# Patient Record
Sex: Female | Born: 1960 | Race: White | Hispanic: No | Marital: Single | Smoking: Never smoker
Health system: Southern US, Community
[De-identification: ages and names within clinical notes are randomized; demographics above are authoritative.]

## PROBLEM LIST (undated history)

## (undated) DIAGNOSIS — D649 Anemia, unspecified: Secondary | ICD-10-CM

## (undated) DIAGNOSIS — E11319 Type 2 diabetes mellitus with unspecified diabetic retinopathy without macular edema: Secondary | ICD-10-CM

## (undated) DIAGNOSIS — E109 Type 1 diabetes mellitus without complications: Secondary | ICD-10-CM

## (undated) DIAGNOSIS — H409 Unspecified glaucoma: Secondary | ICD-10-CM

## (undated) DIAGNOSIS — G629 Polyneuropathy, unspecified: Secondary | ICD-10-CM

## (undated) DIAGNOSIS — R591 Generalized enlarged lymph nodes: Secondary | ICD-10-CM

## (undated) DIAGNOSIS — E7800 Pure hypercholesterolemia, unspecified: Secondary | ICD-10-CM

## (undated) DIAGNOSIS — K861 Other chronic pancreatitis: Secondary | ICD-10-CM

## (undated) DIAGNOSIS — N186 End stage renal disease: Secondary | ICD-10-CM

## (undated) DIAGNOSIS — I639 Cerebral infarction, unspecified: Secondary | ICD-10-CM

## (undated) DIAGNOSIS — G5600 Carpal tunnel syndrome, unspecified upper limb: Secondary | ICD-10-CM

## (undated) DIAGNOSIS — G43909 Migraine, unspecified, not intractable, without status migrainosus: Secondary | ICD-10-CM

## (undated) DIAGNOSIS — M869 Osteomyelitis, unspecified: Secondary | ICD-10-CM

## (undated) DIAGNOSIS — F4310 Post-traumatic stress disorder, unspecified: Secondary | ICD-10-CM

## (undated) DIAGNOSIS — F419 Anxiety disorder, unspecified: Secondary | ICD-10-CM

## (undated) DIAGNOSIS — H3530 Unspecified macular degeneration: Secondary | ICD-10-CM

## (undated) DIAGNOSIS — I10 Essential (primary) hypertension: Secondary | ICD-10-CM

## (undated) DIAGNOSIS — E1169 Type 2 diabetes mellitus with other specified complication: Secondary | ICD-10-CM

## (undated) DIAGNOSIS — E039 Hypothyroidism, unspecified: Secondary | ICD-10-CM

## (undated) DIAGNOSIS — K760 Fatty (change of) liver, not elsewhere classified: Secondary | ICD-10-CM

## (undated) DIAGNOSIS — N189 Chronic kidney disease, unspecified: Secondary | ICD-10-CM

## (undated) DIAGNOSIS — F32A Depression, unspecified: Secondary | ICD-10-CM

## (undated) DIAGNOSIS — F329 Major depressive disorder, single episode, unspecified: Secondary | ICD-10-CM

## (undated) DIAGNOSIS — J189 Pneumonia, unspecified organism: Secondary | ICD-10-CM

## (undated) DIAGNOSIS — G459 Transient cerebral ischemic attack, unspecified: Secondary | ICD-10-CM

## (undated) HISTORY — PX: EYE SURGERY: SHX253

## (undated) HISTORY — DX: Depression, unspecified: F32.A

## (undated) HISTORY — DX: Major depressive disorder, single episode, unspecified: F32.9

## (undated) HISTORY — DX: Carpal tunnel syndrome, unspecified upper limb: G56.00

## (undated) HISTORY — DX: Unspecified macular degeneration: H35.30

## (undated) HISTORY — PX: I & D EXTREMITY: SHX5045

## (undated) HISTORY — PX: CATARACT EXTRACTION, BILATERAL: SHX1313

## (undated) HISTORY — DX: Type 1 diabetes mellitus without complications: E10.9

## (undated) HISTORY — DX: Essential (primary) hypertension: I10

## (undated) HISTORY — DX: Unspecified glaucoma: H40.9

## (undated) HISTORY — DX: Post-traumatic stress disorder, unspecified: F43.10

---
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
## 1991-07-14 DIAGNOSIS — J189 Pneumonia, unspecified organism: Secondary | ICD-10-CM

## 1991-07-14 HISTORY — DX: Pneumonia, unspecified organism: J18.9

## 1993-07-13 HISTORY — PX: TUBAL LIGATION: SHX77

## 2008-08-16 DIAGNOSIS — J309 Allergic rhinitis, unspecified: Secondary | ICD-10-CM | POA: Insufficient documentation

## 2008-09-17 ENCOUNTER — Emergency Department (HOSPITAL_COMMUNITY): Admission: EM | Admit: 2008-09-17 | Discharge: 2008-09-17 | Payer: Self-pay | Admitting: Emergency Medicine

## 2008-11-15 ENCOUNTER — Emergency Department (HOSPITAL_COMMUNITY): Admission: EM | Admit: 2008-11-15 | Discharge: 2008-11-15 | Payer: Self-pay | Admitting: Emergency Medicine

## 2009-08-07 ENCOUNTER — Ambulatory Visit: Payer: Self-pay | Admitting: Vascular Surgery

## 2010-03-31 ENCOUNTER — Ambulatory Visit (HOSPITAL_COMMUNITY): Payer: Self-pay | Admitting: Psychiatry

## 2010-04-01 ENCOUNTER — Ambulatory Visit (HOSPITAL_COMMUNITY): Payer: Self-pay | Admitting: Marriage and Family Therapist

## 2010-04-09 ENCOUNTER — Ambulatory Visit (HOSPITAL_COMMUNITY): Payer: Self-pay | Admitting: Marriage and Family Therapist

## 2010-04-17 ENCOUNTER — Ambulatory Visit (HOSPITAL_COMMUNITY): Payer: Self-pay | Admitting: Marriage and Family Therapist

## 2010-04-18 ENCOUNTER — Ambulatory Visit (HOSPITAL_COMMUNITY): Payer: Self-pay | Admitting: Psychiatry

## 2010-04-24 ENCOUNTER — Ambulatory Visit (HOSPITAL_COMMUNITY): Payer: Self-pay | Admitting: Marriage and Family Therapist

## 2010-04-25 DIAGNOSIS — E559 Vitamin D deficiency, unspecified: Secondary | ICD-10-CM | POA: Insufficient documentation

## 2010-04-25 DIAGNOSIS — E785 Hyperlipidemia, unspecified: Secondary | ICD-10-CM | POA: Insufficient documentation

## 2010-05-08 ENCOUNTER — Ambulatory Visit (HOSPITAL_COMMUNITY): Payer: Self-pay | Admitting: Marriage and Family Therapist

## 2010-05-15 ENCOUNTER — Ambulatory Visit (HOSPITAL_COMMUNITY): Payer: Self-pay | Admitting: Marriage and Family Therapist

## 2010-06-18 ENCOUNTER — Ambulatory Visit (HOSPITAL_COMMUNITY): Payer: Self-pay | Admitting: Psychiatry

## 2010-06-26 ENCOUNTER — Ambulatory Visit (HOSPITAL_COMMUNITY): Payer: Self-pay | Admitting: Marriage and Family Therapist

## 2010-07-13 HISTORY — PX: TOTAL THYROIDECTOMY: SHX2547

## 2010-07-16 ENCOUNTER — Ambulatory Visit (HOSPITAL_COMMUNITY)
Admission: RE | Admit: 2010-07-16 | Discharge: 2010-07-16 | Payer: Self-pay | Source: Home / Self Care | Attending: Psychiatry | Admitting: Psychiatry

## 2010-08-25 ENCOUNTER — Encounter (HOSPITAL_COMMUNITY): Payer: Self-pay | Admitting: Psychiatry

## 2010-08-25 DIAGNOSIS — F411 Generalized anxiety disorder: Secondary | ICD-10-CM

## 2010-09-08 ENCOUNTER — Encounter (HOSPITAL_BASED_OUTPATIENT_CLINIC_OR_DEPARTMENT_OTHER): Payer: Self-pay | Admitting: Marriage and Family Therapist

## 2010-09-08 DIAGNOSIS — F410 Panic disorder [episodic paroxysmal anxiety] without agoraphobia: Secondary | ICD-10-CM

## 2010-09-08 DIAGNOSIS — F339 Major depressive disorder, recurrent, unspecified: Secondary | ICD-10-CM

## 2010-09-15 ENCOUNTER — Encounter (INDEPENDENT_AMBULATORY_CARE_PROVIDER_SITE_OTHER): Payer: Medicaid Other | Admitting: Marriage and Family Therapist

## 2010-09-15 DIAGNOSIS — F339 Major depressive disorder, recurrent, unspecified: Secondary | ICD-10-CM

## 2010-09-15 DIAGNOSIS — F410 Panic disorder [episodic paroxysmal anxiety] without agoraphobia: Secondary | ICD-10-CM

## 2010-09-22 ENCOUNTER — Encounter (INDEPENDENT_AMBULATORY_CARE_PROVIDER_SITE_OTHER): Payer: Medicaid Other | Admitting: Marriage and Family Therapist

## 2010-09-22 DIAGNOSIS — F410 Panic disorder [episodic paroxysmal anxiety] without agoraphobia: Secondary | ICD-10-CM

## 2010-09-22 DIAGNOSIS — F339 Major depressive disorder, recurrent, unspecified: Secondary | ICD-10-CM

## 2010-10-01 DIAGNOSIS — M2550 Pain in unspecified joint: Secondary | ICD-10-CM | POA: Insufficient documentation

## 2010-10-06 ENCOUNTER — Encounter (HOSPITAL_COMMUNITY): Payer: Medicaid Other | Admitting: Marriage and Family Therapist

## 2010-10-07 ENCOUNTER — Encounter (INDEPENDENT_AMBULATORY_CARE_PROVIDER_SITE_OTHER): Payer: Medicaid Other | Admitting: Marriage and Family Therapist

## 2010-10-07 DIAGNOSIS — F39 Unspecified mood [affective] disorder: Secondary | ICD-10-CM

## 2010-10-15 ENCOUNTER — Encounter (INDEPENDENT_AMBULATORY_CARE_PROVIDER_SITE_OTHER): Payer: Medicaid Other | Admitting: Psychiatry

## 2010-10-15 DIAGNOSIS — F411 Generalized anxiety disorder: Secondary | ICD-10-CM

## 2010-10-20 ENCOUNTER — Encounter (HOSPITAL_COMMUNITY): Payer: Medicaid Other | Admitting: Marriage and Family Therapist

## 2010-10-21 LAB — POCT I-STAT, CHEM 8
BUN: 13 mg/dL (ref 6–23)
Calcium, Ion: 1.16 mmol/L (ref 1.12–1.32)
Chloride: 100 mEq/L (ref 96–112)
Creatinine, Ser: 0.5 mg/dL (ref 0.4–1.2)
Glucose, Bld: 305 mg/dL — ABNORMAL HIGH (ref 70–99)
HCT: 40 % (ref 36.0–46.0)
Hemoglobin: 13.6 g/dL (ref 12.0–15.0)
Potassium: 3.7 mEq/L (ref 3.5–5.1)
Sodium: 134 mEq/L — ABNORMAL LOW (ref 135–145)
TCO2: 25 mmol/L (ref 0–100)

## 2010-10-21 LAB — BRAIN NATRIURETIC PEPTIDE: Pro B Natriuretic peptide (BNP): <30 pg/mL (ref 0.0–100.0)

## 2010-10-21 LAB — D-DIMER, QUANTITATIVE: D-Dimer, Quant: 0.23 ug/mL-FEU (ref 0.00–0.48)

## 2010-10-21 LAB — POCT CARDIAC MARKERS
CKMB, poc: <1 ng/mL — ABNORMAL LOW (ref 1.0–8.0)
Myoglobin, poc: 48.5 ng/mL (ref 12–200)
Troponin i, poc: <0.05 ng/mL (ref 0.00–0.09)

## 2010-10-21 LAB — GLUCOSE, CAPILLARY: Glucose-Capillary: 231 mg/dL — ABNORMAL HIGH (ref 70–99)

## 2010-10-23 LAB — URINALYSIS, ROUTINE W REFLEX MICROSCOPIC
Bilirubin Urine: NEGATIVE
Glucose, UA: >1000 mg/dL — AB
Hgb urine dipstick: NEGATIVE
Ketones, ur: 40 mg/dL — AB
Leukocytes, UA: NEGATIVE
Nitrite: NEGATIVE
Protein, ur: NEGATIVE mg/dL
Specific Gravity, Urine: 1.04 — ABNORMAL HIGH (ref 1.005–1.030)
Urobilinogen, UA: 0.2 mg/dL (ref 0.0–1.0)
pH: 5.5 (ref 5.0–8.0)

## 2010-10-23 LAB — ETHANOL: Alcohol, Ethyl (B): <5 mg/dL (ref 0–10)

## 2010-10-23 LAB — DIFFERENTIAL
Basophils Absolute: 0.1 10*3/uL (ref 0.0–0.1)
Basophils Relative: 1 % (ref 0–1)
Eosinophils Absolute: 0.4 10*3/uL (ref 0.0–0.7)
Eosinophils Relative: 4 % (ref 0–5)
Lymphocytes Relative: 30 % (ref 12–46)
Lymphs Abs: 3 10*3/uL (ref 0.7–4.0)
Monocytes Absolute: 0.6 10*3/uL (ref 0.1–1.0)
Monocytes Relative: 6 % (ref 3–12)
Neutro Abs: 6 10*3/uL (ref 1.7–7.7)
Neutrophils Relative %: 60 % (ref 43–77)

## 2010-10-23 LAB — COMPREHENSIVE METABOLIC PANEL
ALT: 22 U/L (ref 0–35)
AST: 13 U/L (ref 0–37)
Albumin: 3.1 g/dL — ABNORMAL LOW (ref 3.5–5.2)
Alkaline Phosphatase: 104 U/L (ref 39–117)
BUN: 11 mg/dL (ref 6–23)
CO2: 24 mEq/L (ref 19–32)
Calcium: 8.8 mg/dL (ref 8.4–10.5)
Chloride: 97 mEq/L (ref 96–112)
Creatinine, Ser: 0.51 mg/dL (ref 0.4–1.2)
GFR calc Af Amer: >60 mL/min (ref >60)
GFR calc non Af Amer: >60 mL/min (ref >60)
Glucose, Bld: 361 mg/dL — ABNORMAL HIGH (ref 70–99)
Potassium: 3.5 mEq/L (ref 3.5–5.1)
Sodium: 131 mEq/L — ABNORMAL LOW (ref 135–145)
Total Bilirubin: 0.7 mg/dL (ref 0.3–1.2)
Total Protein: 6 g/dL (ref 6.0–8.3)

## 2010-10-23 LAB — CBC
HCT: 36.5 % (ref 36.0–46.0)
Hemoglobin: 12.9 g/dL (ref 12.0–15.0)
MCHC: 35.4 g/dL (ref 30.0–36.0)
MCV: 90.6 fL (ref 78.0–100.0)
Platelets: 323 10*3/uL (ref 150–400)
RBC: 4.03 MIL/uL (ref 3.87–5.11)
RDW: 12.7 % (ref 11.5–15.5)
WBC: 9.9 10*3/uL (ref 4.0–10.5)

## 2010-10-23 LAB — POCT PREGNANCY, URINE: Preg Test, Ur: NEGATIVE

## 2010-10-23 LAB — GLUCOSE, CAPILLARY: Glucose-Capillary: 388 mg/dL — ABNORMAL HIGH (ref 70–99)

## 2010-10-23 LAB — URINE MICROSCOPIC-ADD ON

## 2010-10-23 LAB — RAPID URINE DRUG SCREEN, HOSP PERFORMED
Amphetamines: NOT DETECTED
Barbiturates: NOT DETECTED
Benzodiazepines: NOT DETECTED
Cocaine: NOT DETECTED
Opiates: POSITIVE — AB
Tetrahydrocannabinol: NOT DETECTED

## 2010-10-23 LAB — LIPASE, BLOOD: Lipase: 35 U/L (ref 11–59)

## 2010-12-01 ENCOUNTER — Emergency Department (HOSPITAL_COMMUNITY): Payer: Medicaid Other

## 2010-12-01 ENCOUNTER — Emergency Department (HOSPITAL_COMMUNITY)
Admission: EM | Admit: 2010-12-01 | Discharge: 2010-12-01 | Disposition: A | Discharge status: Home / Self Care | Payer: Medicaid Other | Attending: Emergency Medicine | Admitting: Emergency Medicine

## 2010-12-01 DIAGNOSIS — I10 Essential (primary) hypertension: Secondary | ICD-10-CM | POA: Insufficient documentation

## 2010-12-01 DIAGNOSIS — L02619 Cutaneous abscess of unspecified foot: Secondary | ICD-10-CM | POA: Insufficient documentation

## 2010-12-01 DIAGNOSIS — L03119 Cellulitis of unspecified part of limb: Secondary | ICD-10-CM | POA: Insufficient documentation

## 2010-12-01 DIAGNOSIS — M7989 Other specified soft tissue disorders: Secondary | ICD-10-CM | POA: Insufficient documentation

## 2010-12-01 DIAGNOSIS — M79609 Pain in unspecified limb: Secondary | ICD-10-CM | POA: Insufficient documentation

## 2010-12-01 DIAGNOSIS — E119 Type 2 diabetes mellitus without complications: Secondary | ICD-10-CM | POA: Insufficient documentation

## 2010-12-01 LAB — DIFFERENTIAL
Basophils Absolute: 0.1 10*3/uL (ref 0.0–0.1)
Basophils Relative: 0 % (ref 0–1)
Eosinophils Absolute: 0.4 10*3/uL (ref 0.0–0.7)
Eosinophils Relative: 3 % (ref 0–5)
Lymphocytes Relative: 33 % (ref 12–46)
Lymphs Abs: 5.3 10*3/uL — ABNORMAL HIGH (ref 0.7–4.0)
Monocytes Absolute: 0.9 10*3/uL (ref 0.1–1.0)
Monocytes Relative: 6 % (ref 3–12)
Neutro Abs: 9.4 10*3/uL — ABNORMAL HIGH (ref 1.7–7.7)
Neutrophils Relative %: 59 % (ref 43–77)

## 2010-12-01 LAB — CBC
HCT: 39 % (ref 36.0–46.0)
Hemoglobin: 13.7 g/dL (ref 12.0–15.0)
MCH: 31.4 pg (ref 26.0–34.0)
MCHC: 35.1 g/dL (ref 30.0–36.0)
MCV: 89.4 fL (ref 78.0–100.0)
Platelets: 400 10*3/uL (ref 150–400)
RBC: 4.36 MIL/uL (ref 3.87–5.11)
RDW: 11.9 % (ref 11.5–15.5)
WBC: 16 10*3/uL — ABNORMAL HIGH (ref 4.0–10.5)

## 2010-12-01 LAB — BASIC METABOLIC PANEL
BUN: 19 mg/dL (ref 6–23)
CO2: 25 mEq/L (ref 19–32)
Calcium: 8.8 mg/dL (ref 8.4–10.5)
Chloride: 100 mEq/L (ref 96–112)
Creatinine, Ser: 0.57 mg/dL (ref 0.4–1.2)
GFR calc Af Amer: >60 mL/min (ref >60)
GFR calc non Af Amer: >60 mL/min (ref >60)
Glucose, Bld: 266 mg/dL — ABNORMAL HIGH (ref 70–99)
Potassium: 3.7 mEq/L (ref 3.5–5.1)
Sodium: 135 mEq/L (ref 135–145)

## 2010-12-01 LAB — POCT I-STAT, CHEM 8
BUN: 26 mg/dL — ABNORMAL HIGH (ref 6–23)
Calcium, Ion: 1.02 mmol/L — ABNORMAL LOW (ref 1.12–1.32)
Chloride: 99 mEq/L (ref 96–112)
Creatinine, Ser: 0.8 mg/dL (ref 0.4–1.2)
Glucose, Bld: 390 mg/dL — ABNORMAL HIGH (ref 70–99)
HCT: 41 % (ref 36.0–46.0)
Hemoglobin: 13.9 g/dL (ref 12.0–15.0)
Potassium: 3.8 mEq/L (ref 3.5–5.1)
Sodium: 133 mEq/L — ABNORMAL LOW (ref 135–145)
TCO2: 25 mmol/L (ref 0–100)

## 2010-12-01 LAB — GLUCOSE, CAPILLARY: Glucose-Capillary: 248 mg/dL — ABNORMAL HIGH (ref 70–99)

## 2010-12-17 ENCOUNTER — Encounter (INDEPENDENT_AMBULATORY_CARE_PROVIDER_SITE_OTHER): Payer: Medicaid Other | Admitting: Psychiatry

## 2010-12-17 DIAGNOSIS — F411 Generalized anxiety disorder: Secondary | ICD-10-CM

## 2010-12-30 ENCOUNTER — Encounter (INDEPENDENT_AMBULATORY_CARE_PROVIDER_SITE_OTHER): Payer: Medicaid Other | Admitting: Marriage and Family Therapist

## 2010-12-30 DIAGNOSIS — F410 Panic disorder [episodic paroxysmal anxiety] without agoraphobia: Secondary | ICD-10-CM

## 2010-12-30 DIAGNOSIS — F339 Major depressive disorder, recurrent, unspecified: Secondary | ICD-10-CM

## 2011-01-12 ENCOUNTER — Encounter (INDEPENDENT_AMBULATORY_CARE_PROVIDER_SITE_OTHER): Payer: Medicaid Other | Admitting: Marriage and Family Therapist

## 2011-01-12 DIAGNOSIS — F39 Unspecified mood [affective] disorder: Secondary | ICD-10-CM

## 2011-01-21 ENCOUNTER — Encounter (HOSPITAL_COMMUNITY): Payer: Medicaid Other | Admitting: Psychiatry

## 2011-01-21 DIAGNOSIS — F411 Generalized anxiety disorder: Secondary | ICD-10-CM

## 2011-02-12 ENCOUNTER — Encounter (INDEPENDENT_AMBULATORY_CARE_PROVIDER_SITE_OTHER): Payer: Medicaid Other | Admitting: Marriage and Family Therapist

## 2011-02-12 DIAGNOSIS — F39 Unspecified mood [affective] disorder: Secondary | ICD-10-CM

## 2011-02-20 ENCOUNTER — Emergency Department (HOSPITAL_COMMUNITY): Payer: Medicaid Other

## 2011-02-20 ENCOUNTER — Inpatient Hospital Stay (HOSPITAL_COMMUNITY)
Admission: EM | Admit: 2011-02-20 | Discharge: 2011-02-23 | DRG: 392 | Disposition: A | Discharge status: Home / Self Care | Payer: Medicaid Other | Attending: Internal Medicine | Admitting: Internal Medicine

## 2011-02-20 DIAGNOSIS — F411 Generalized anxiety disorder: Secondary | ICD-10-CM | POA: Diagnosis present

## 2011-02-20 DIAGNOSIS — A09 Infectious gastroenteritis and colitis, unspecified: Principal | ICD-10-CM | POA: Diagnosis present

## 2011-02-20 DIAGNOSIS — E785 Hyperlipidemia, unspecified: Secondary | ICD-10-CM | POA: Diagnosis present

## 2011-02-20 DIAGNOSIS — I10 Essential (primary) hypertension: Secondary | ICD-10-CM | POA: Diagnosis present

## 2011-02-20 DIAGNOSIS — E11319 Type 2 diabetes mellitus with unspecified diabetic retinopathy without macular edema: Secondary | ICD-10-CM | POA: Diagnosis present

## 2011-02-20 DIAGNOSIS — Z794 Long term (current) use of insulin: Secondary | ICD-10-CM

## 2011-02-20 DIAGNOSIS — E1139 Type 2 diabetes mellitus with other diabetic ophthalmic complication: Secondary | ICD-10-CM | POA: Diagnosis present

## 2011-02-20 DIAGNOSIS — E876 Hypokalemia: Secondary | ICD-10-CM | POA: Diagnosis not present

## 2011-02-20 DIAGNOSIS — E669 Obesity, unspecified: Secondary | ICD-10-CM | POA: Diagnosis present

## 2011-02-20 LAB — POCT I-STAT, CHEM 8
BUN: 19 mg/dL (ref 6–23)
Calcium, Ion: 1.13 mmol/L (ref 1.12–1.32)
Chloride: 100 mEq/L (ref 96–112)
Creatinine, Ser: 0.8 mg/dL (ref 0.50–1.10)
Glucose, Bld: 280 mg/dL — ABNORMAL HIGH (ref 70–99)
HCT: 49 % — ABNORMAL HIGH (ref 36.0–46.0)
Hemoglobin: 16.7 g/dL — ABNORMAL HIGH (ref 12.0–15.0)
Potassium: 3.2 mEq/L — ABNORMAL LOW (ref 3.5–5.1)
Sodium: 137 mEq/L (ref 135–145)
TCO2: 25 mmol/L (ref 0–100)

## 2011-02-20 LAB — CBC
HCT: 44.9 % (ref 36.0–46.0)
Hemoglobin: 16.2 g/dL — ABNORMAL HIGH (ref 12.0–15.0)
MCH: 32.1 pg (ref 26.0–34.0)
MCHC: 36.1 g/dL — ABNORMAL HIGH (ref 30.0–36.0)
MCV: 88.9 fL (ref 78.0–100.0)
Platelets: 492 10*3/uL — ABNORMAL HIGH (ref 150–400)
RBC: 5.05 MIL/uL (ref 3.87–5.11)
RDW: 12.1 % (ref 11.5–15.5)
WBC: 18.8 10*3/uL — ABNORMAL HIGH (ref 4.0–10.5)

## 2011-02-20 LAB — DIFFERENTIAL
Basophils Absolute: 0 10*3/uL (ref 0.0–0.1)
Basophils Relative: 0 % (ref 0–1)
Eosinophils Absolute: 0.6 10*3/uL (ref 0.0–0.7)
Eosinophils Relative: 3 % (ref 0–5)
Lymphocytes Relative: 46 % (ref 12–46)
Lymphs Abs: 8.6 10*3/uL — ABNORMAL HIGH (ref 0.7–4.0)
Monocytes Absolute: 1.1 10*3/uL — ABNORMAL HIGH (ref 0.1–1.0)
Monocytes Relative: 6 % (ref 3–12)
Neutro Abs: 8.5 10*3/uL — ABNORMAL HIGH (ref 1.7–7.7)
Neutrophils Relative %: 45 % (ref 43–77)

## 2011-02-20 LAB — URINE MICROSCOPIC-ADD ON

## 2011-02-20 LAB — GLUCOSE, CAPILLARY
Glucose-Capillary: 313 mg/dL — ABNORMAL HIGH (ref 70–99)
Glucose-Capillary: 279 mg/dL — ABNORMAL HIGH (ref 70–99)
Glucose-Capillary: 178 mg/dL — ABNORMAL HIGH (ref 70–99)

## 2011-02-20 LAB — URINALYSIS, ROUTINE W REFLEX MICROSCOPIC
Glucose, UA: NEGATIVE mg/dL
Hgb urine dipstick: NEGATIVE
Ketones, ur: NEGATIVE mg/dL
Leukocytes, UA: NEGATIVE
Nitrite: NEGATIVE
Protein, ur: 30 mg/dL — AB
Specific Gravity, Urine: 1.014 (ref 1.005–1.030)
Urobilinogen, UA: 1 mg/dL (ref 0.0–1.0)
pH: 6 (ref 5.0–8.0)

## 2011-02-20 LAB — CLOSTRIDIUM DIFFICILE BY PCR: Toxigenic C. Difficile by PCR: NEGATIVE

## 2011-02-20 LAB — PATHOLOGIST SMEAR REVIEW

## 2011-02-20 MED ORDER — IOHEXOL 300 MG/ML  SOLN
100.0000 mL | Freq: Once | INTRAMUSCULAR | Status: AC | PRN
  Administered 2011-02-20: 100 mL via INTRAVENOUS

## 2011-02-21 LAB — BASIC METABOLIC PANEL
BUN: 12 mg/dL (ref 6–23)
CO2: 24 mEq/L (ref 19–32)
Calcium: 7.9 mg/dL — ABNORMAL LOW (ref 8.4–10.5)
Chloride: 103 mEq/L (ref 96–112)
Creatinine, Ser: 0.59 mg/dL (ref 0.50–1.10)
GFR calc Af Amer: >60 mL/min (ref >60)
GFR calc non Af Amer: >60 mL/min (ref >60)
Glucose, Bld: 173 mg/dL — ABNORMAL HIGH (ref 70–99)
Potassium: 3.5 mEq/L (ref 3.5–5.1)
Sodium: 136 mEq/L (ref 135–145)

## 2011-02-21 LAB — CBC
HCT: 35.8 % — ABNORMAL LOW (ref 36.0–46.0)
Hemoglobin: 12.2 g/dL (ref 12.0–15.0)
MCH: 30.9 pg (ref 26.0–34.0)
MCHC: 34.1 g/dL (ref 30.0–36.0)
MCV: 90.6 fL (ref 78.0–100.0)
Platelets: 363 10*3/uL (ref 150–400)
RBC: 3.95 MIL/uL (ref 3.87–5.11)
RDW: 12.1 % (ref 11.5–15.5)
WBC: 16.4 10*3/uL — ABNORMAL HIGH (ref 4.0–10.5)

## 2011-02-21 LAB — HEMOGLOBIN A1C
Hgb A1c MFr Bld: 11.2 % — ABNORMAL HIGH (ref <5.7)
Mean Plasma Glucose: 275 mg/dL — ABNORMAL HIGH (ref <117)

## 2011-02-21 LAB — GLUCOSE, CAPILLARY
Glucose-Capillary: 169 mg/dL — ABNORMAL HIGH (ref 70–99)
Glucose-Capillary: 205 mg/dL — ABNORMAL HIGH (ref 70–99)
Glucose-Capillary: 210 mg/dL — ABNORMAL HIGH (ref 70–99)

## 2011-02-22 LAB — GLUCOSE, CAPILLARY
Glucose-Capillary: 215 mg/dL — ABNORMAL HIGH (ref 70–99)
Glucose-Capillary: 162 mg/dL — ABNORMAL HIGH (ref 70–99)
Glucose-Capillary: 211 mg/dL — ABNORMAL HIGH (ref 70–99)
Glucose-Capillary: 201 mg/dL — ABNORMAL HIGH (ref 70–99)
Glucose-Capillary: 237 mg/dL — ABNORMAL HIGH (ref 70–99)

## 2011-02-22 LAB — BASIC METABOLIC PANEL
BUN: 6 mg/dL (ref 6–23)
CO2: 27 mEq/L (ref 19–32)
Calcium: 8 mg/dL — ABNORMAL LOW (ref 8.4–10.5)
Chloride: 102 mEq/L (ref 96–112)
Creatinine, Ser: 0.58 mg/dL (ref 0.50–1.10)
GFR calc Af Amer: >60 mL/min (ref >60)
GFR calc non Af Amer: >60 mL/min (ref >60)
Glucose, Bld: 226 mg/dL — ABNORMAL HIGH (ref 70–99)
Potassium: 3.3 mEq/L — ABNORMAL LOW (ref 3.5–5.1)
Sodium: 138 mEq/L (ref 135–145)

## 2011-02-23 LAB — GLUCOSE, CAPILLARY
Glucose-Capillary: 259 mg/dL — ABNORMAL HIGH (ref 70–99)
Glucose-Capillary: 139 mg/dL — ABNORMAL HIGH (ref 70–99)

## 2011-02-23 LAB — BASIC METABOLIC PANEL
BUN: 7 mg/dL (ref 6–23)
CO2: 27 mEq/L (ref 19–32)
Calcium: 8.1 mg/dL — ABNORMAL LOW (ref 8.4–10.5)
Chloride: 104 mEq/L (ref 96–112)
Creatinine, Ser: 0.5 mg/dL (ref 0.50–1.10)
GFR calc Af Amer: >60 mL/min (ref >60)
GFR calc non Af Amer: >60 mL/min (ref >60)
Glucose, Bld: 146 mg/dL — ABNORMAL HIGH (ref 70–99)
Potassium: 3.6 mEq/L (ref 3.5–5.1)
Sodium: 136 mEq/L (ref 135–145)

## 2011-02-23 LAB — OVA AND PARASITE EXAMINATION

## 2011-02-23 LAB — STOOL CULTURE

## 2011-03-18 NOTE — H&P (Signed)
NAME:  Suzanne Allen, PAPER NO.:  [id number]  MEDICAL RECORD NO.:  [id number]  LOCATION:  [facility]LED                         FACILITY:  [facility]  PHYSICIAN:  Verlee Monte, MD       DATE OF BIRTH:  1960/12/03  DATE OF ADMISSION:  02/20/2011 DATE OF DISCHARGE:                             HISTORY & PHYSICAL   PRIMARY CARE PHYSICIAN:  Dr. Rachell Cipro.  REASON FOR ADMISSION: 1. Colitis. 2. Anxiety.  HISTORY OF PRESENT ILLNESS:  Suzanne Allen is a 50 year old female with past medical history of insulin-dependent diabetes mellitus, hypertension, and hyperlipidemia.  The patient came into the hospital, complaining about abdominal pain.  The patient mentioned that she was in her usual state of health until this morning about 3 in the morning when she did have some abdominal pain.  First, she thought she have constipation. She took Dulcolax and some fiber therapy pills and afterwards, the patient started to have increasing in her abdominal pain.  So the patient came into the hospital for further evaluation.  The pain is central to left lower quadrant, 8/10 to 9/10, constant to sometimes colicky.  The patient denies any fever, denies any chills, has loose stools.  The patient while in the hospital here, she had more than 5 watery bowel movements.  The patient has some nausea and vomiting as well.  Denies any recent weight loss.  Denies any recent sick contacts. No recent cookout.  No alcohol consumption or dysuria.  PAST MEDICAL HISTORY: 1. Hypertension. 2. Diabetes. 3. Dyslipidemia. 4. Diabetic retinopathy.  PAST SURGICAL HISTORY:  Cesarean section.  SOCIAL HISTORY:  The patient has remote history of about a half-pack per day for about 10 years.  Denies drinking or recreational drug use.  FAMILY HISTORY:  The patient has no family history of diabetes, but has 2 daughters with diabetes.  MEDICATIONS: 1. Cymbalta. 2. Humalog. 3. Levothyroxine. 4.  Lisinopril.  ALLERGIES:  ERYTHROMYCIN.  REVIEW OF SYSTEMS:  GENERAL:  Denies fever, has weakness.  ENT:  Denies nasal congestion, sore throat.  CARDIOVASCULAR:  Denies chest pain. RESPIRATORY:  Denies cough or wheezing.  GASTROINTESTINAL:  She has nausea, vomiting, abdominal pain, and diarrhea per HPI.  GU:  Denies dysuria or flank pain.  MUSCULOSKELETAL:  Denies trauma or back pain. SKIN:  Denies rash.  NEUROLOGIC:  Denies any weakness.  PSYCHIATRIC: Positive for anxiety.  ALLERGIES:  Denies rash.  PHYSICAL EXAMINATION:  VITAL SIGNS:  Temperature is 98.3, respiration is 16, pulse rate is 102, blood pressure is 147/77, and O2 saturation is 100% on room air. GENERAL:  The patient is well-developed, well-nourished, and uncomfortable appearing Caucasian female. HEAD AND FACE:  Normocephalic and atraumatic. EYES:  Pupils equal, reactive to light and accommodation. ENT:  Normal mouth without oral thrush or lesions. NECK:  Supple.  No meningeal signs or JVD appreciated. SPINE:  Nontender. CARDIOVASCULAR:  Regular rate and rhythm.  No murmurs, rubs, or gallops. RESPIRATORY:  Clear to auscultation bilaterally. ABDOMEN:  Bowel sounds soft.  The patient is obese.  There is obvious tanning line in her stomach.  The patient has moderate tenderness in central abdomen and in the left lower quadrant.  NEUROLOGIC:  Motor intact all extremities.  Alert, awake, and oriented x3. SKIN:  No rash.  Warm and dry.  RADIOLOGY:  CT scan of abdomen showed minimal inflammatory changes about the colon and splenic flexure.  Descending colon and distal transverse colon compatible with colitis.  There is no abscess or free air.  There is mild atherosclerotic changes.  There is hepatic steatosis.  Chest and abdominal x-ray showed no acute cardiopulmonary findings.  LABORATORY DATA:  CBC, WBCs 18.8, hemoglobin 16.2, hematocrit 44.9, and platelets 492.  Chem-8 panel, sodium 137, potassium 3.8, chloride  100, glucose 280, BUN is 19, and creatinine 0.8.  Urinalysis showed negative leukocyte esterase.  Negative nitrite with minimal pyuria of wbc's 3 to 6.  ASSESSMENT AND PLAN: 1. Colitis.  The patient will be admitted to the hospital to regular     bed.  IV antibiotics will be instituted.  The patient will be     started on Cipro and Flagyl.  We will start the patient on clear     liquids as well as advancing her diet if she is tolerating that.     Stools will be sent to culture, although the patient does not have     any risk factors for Clostridium difficile.  This will be checked     also. 2. Diabetes mellitus type 2.  The patient will be on clear liquids.     We will put her on Humalog sliding scale for now.  Check hemoglobin     A1c.  The patient's blood glucose is about 280.  There is no     evidence of acidosis. 3. Hypertension.  Restart her lisinopril and adjust the dose according     to her blood pressure.     Verlee Monte, MD     ME/MEDQ  D:  02/20/2011  T:  02/20/2011  Job:  [id number]  cc:   Rachell Cipro, M.D. Fax: [phone]  Electronically Signed by Verlee Monte  on 03/18/2011 02:55:38 PM

## 2011-03-18 NOTE — Discharge Summary (Signed)
NAME:  Suzanne Allen, Suzanne Allen NO.:  [id number]  MEDICAL RECORD NO.:  [id number]  LOCATION:  [id number]                         FACILITY:  [facility]  PHYSICIAN:  Verlee Monte, MD       DATE OF BIRTH:  June 05, 1961  DATE OF ADMISSION:  02/20/2011 DATE OF DISCHARGE:  02/23/2011                              DISCHARGE SUMMARY   PRIMARY CARE PHYSICIAN:  Rachell Cipro, MD  REASON FOR ADMISSION:  Nausea, abdominal pain, diarrhea.  DISCHARGE DIAGNOSES: 1. Colitis. 2. Hypertension. 3. Diabetes mellitus type 2, uncontrolled. 4. Dyslipidemia. 5. Diabetic retinopathy.  DISCHARGE MEDICATIONS: 1. Ciprofloxacin 500 mg p.o. b.i.d. for 8 more days. 2. Metronidazole 500 mg 3 times daily for 8 more days. 3. Clonazepam 0.5 mg daily as needed for anxiety. 4. Cymbalta 60 mg p.o. daily. 5. Humalog insulin 40 units in the morning before breakfast, lunch,     supper, bedtime, and snack; if it is 180 or greater, then take 50     units subcutaneously 4 times day. 6. Lantus insulin; if sugar close to 200, the patient gives herself     40; 300 or greater, then give 60; uses sliding scale as well.     Lantus insulin 40-60 subcutaneous b.i.d.; if sugar close to 200,     the patient gives herself 40 units; if 300 or greater, then 60     units.  The patient uses sliding scale. 7. Levothyroxine 137 mcg daily. 8. Lisinopril/hydrochlorothiazide 20/25 mg p.o. daily. 9. Ofloxacin 0.3% 1 drop in left eye 4 times a day. 10.Simvastatin 20 mg p.o. daily. 11.Vitamin B complex OTC p.o. daily.  BRIEF HOSPITAL EXAMINATION:  Suzanne Allen is a 50 year old female with past medical history of insulin-dependent diabetes mellitus, hypertension, hyperlipidemia.  The patient came in to the hospital complaining about abdominal pain.  The patient mentioned that she was in her usual state of health until 3 in the morning when she did have some abdominal pain. The patient thought first she had some constipation, so she  took Dulcolax and some fiber therapy pills.  The patient started to have increasing abdominal pain, so the patient came in to the hospital for further evaluation.  The patient's pain is central to the left lower quadrant, 8/10 to 9/10, constant, and colicky.  The patient denies any fever or  chills, had loose stools while patient in the hospital.  She did have 5 watery bowel movements. Initial evaluation in the emergency department showed colitis on CT scan.  CT abdomen and pelvis on August 10th showed minimal inflammatory changes about the colon, splenic flexure, descending colon, and distal transverse colon compatible with nonspecific colitis.  There was no abscess or free air.  Mild atherosclerotic changes without aneurysm.  BRIEF HOSPITAL STAY: 1. Colitis.  The patient admitted to the hospital, started on clear     liquids to rest the bowel and then the diet advanced as tolerated.     The patient was placed on Cipro and Flagyl.  The culture was sent     which was negative as well as C difficile was negative for colitis.     C difficile was negative by PCR,  but the colitis was assumed     infectious and the ciprofloxacin and Flagyl to be continued for 10     days. 2. 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  The     patient did have retinal hemorrhage for uncontrolled diabetes     mellitus.  The patient does have a hemoglobin A1c of 11.2. The     patient uses sliding scale even for the Lantus.  The patient will     need more tight glycemic control to decrease rate of her     complications as she is only 50, to follow up with her primary care     physician. 3. Hypertension, controlled.  Her blood pressure medication was     continued from home.  The patient does have ACE inhibitor and blood     pressure medications. 4. Dyslipidemia.  This is stable. 5. Hypokalemia.  This is likely secondary from the diarrhea.  The     nadir of her potassium is 3.3.  This was  repleted.  On the day of     discharge, her potassium was 3.6.  DISCHARGE INSTRUCTIONS: 1. Activity, as tolerated. 2. Disposition home. 3. Diet, carbohydrate-modified diet.     Verlee Monte, MD     ME/MEDQ  D:  02/23/2011  T:  02/24/2011  Job:  [id number]  cc:   Rachell Cipro, M.D. Fax: [phone]  Electronically Signed by Verlee Monte  on 03/18/2011 02:55:29 PM

## 2011-03-27 ENCOUNTER — Encounter (HOSPITAL_COMMUNITY): Payer: Medicaid Other | Admitting: Psychiatry

## 2011-04-06 ENCOUNTER — Encounter (INDEPENDENT_AMBULATORY_CARE_PROVIDER_SITE_OTHER): Payer: Medicaid Other | Admitting: Psychiatry

## 2011-04-06 DIAGNOSIS — F411 Generalized anxiety disorder: Secondary | ICD-10-CM

## 2011-04-20 ENCOUNTER — Encounter (INDEPENDENT_AMBULATORY_CARE_PROVIDER_SITE_OTHER): Payer: Medicaid Other | Admitting: Marriage and Family Therapist

## 2011-04-20 DIAGNOSIS — F339 Major depressive disorder, recurrent, unspecified: Secondary | ICD-10-CM

## 2011-04-20 DIAGNOSIS — F410 Panic disorder [episodic paroxysmal anxiety] without agoraphobia: Secondary | ICD-10-CM

## 2011-06-01 ENCOUNTER — Ambulatory Visit (HOSPITAL_COMMUNITY): Payer: Self-pay | Admitting: Psychiatry

## 2011-06-22 ENCOUNTER — Ambulatory Visit (INDEPENDENT_AMBULATORY_CARE_PROVIDER_SITE_OTHER): Payer: Medicaid Other | Admitting: Psychiatry

## 2011-06-22 ENCOUNTER — Encounter (HOSPITAL_COMMUNITY): Payer: Self-pay | Admitting: Psychiatry

## 2011-06-22 VITALS — Wt 164.0 lb

## 2011-06-22 DIAGNOSIS — F411 Generalized anxiety disorder: Secondary | ICD-10-CM

## 2011-06-22 DIAGNOSIS — F419 Anxiety disorder, unspecified: Secondary | ICD-10-CM

## 2011-06-22 MED ORDER — CLONAZEPAM 0.5 MG PO TABS: 0.5000 mg | ORAL_TABLET | Freq: Every evening | ORAL | Status: DC | PRN

## 2011-06-22 MED ORDER — DULOXETINE HCL 60 MG PO CPEP: 60.0000 mg | ORAL_CAPSULE | Freq: Every day | ORAL | Status: DC

## 2011-06-22 NOTE — Progress Notes (Signed)
Patient came for her followup appointment. She has some anxiety as she is scheduled to have eye procedure. She is not sleeping good but overall her depression has improved. She reported no side effects of Cymbalta. She denies any agitation anger or mood swings. She is not taking Klonopin every night however she liked to take so that she can sleep better. She reported no side effects of medication. Patient is calm cooperative and pleasant. Her speech is soft clear and coherent. She denies any auditory or visual hallucination. She denies any active or passive suicidal thoughts. She's alert and oriented x3 she described her mood is anxious and her affect is constricted. Her attention and concentration is fair. Her insight judgment and impulse control is okay Assessment Anxiety disorder NOS Plan I will continue Cymbalta 60 mg daily and recommended to take clonazepam 0.5 mg at bedtime until she had eye procedure. After that she can take as needed only. I recommended to call us if she is any question or concern about the medication otherwise I will see her again in 3 months

## 2011-07-14 HISTORY — PX: REFRACTIVE SURGERY: SHX103

## 2011-08-24 ENCOUNTER — Ambulatory Visit (INDEPENDENT_AMBULATORY_CARE_PROVIDER_SITE_OTHER): Payer: Medicaid Other | Admitting: Psychiatry

## 2011-08-24 ENCOUNTER — Encounter (HOSPITAL_COMMUNITY): Payer: Self-pay | Admitting: Psychiatry

## 2011-08-24 DIAGNOSIS — F3289 Other specified depressive episodes: Secondary | ICD-10-CM

## 2011-08-24 DIAGNOSIS — F419 Anxiety disorder, unspecified: Secondary | ICD-10-CM | POA: Insufficient documentation

## 2011-08-24 DIAGNOSIS — F32A Depression, unspecified: Secondary | ICD-10-CM | POA: Insufficient documentation

## 2011-08-24 DIAGNOSIS — F329 Major depressive disorder, single episode, unspecified: Secondary | ICD-10-CM

## 2011-08-24 DIAGNOSIS — F411 Generalized anxiety disorder: Secondary | ICD-10-CM

## 2011-08-24 MED ORDER — CLONAZEPAM 0.5 MG PO TABS: 0.5000 mg | ORAL_TABLET | Freq: Every evening | ORAL | Status: DC | PRN

## 2011-08-24 MED ORDER — DULOXETINE HCL 60 MG PO CPEP: 60.0000 mg | ORAL_CAPSULE | Freq: Every day | ORAL | Status: DC

## 2011-08-24 NOTE — Progress Notes (Signed)
Chief complaint I need my medication, I still have anxiety and depression  History of presenting illness Patient is 51 year old Caucasian widowed woman who came for her appointment. Patient has been compliant with the Cymbalta and Klonopin. She continues to have residual anxiety and depression. She reported no side effects of medication. She is seeing eye doctor every month for laser surgery. She continues to have difficulty controlling her blood sugar. Despite watching her diet her blood sugar still fluctuates. However she is relief as her hemoglobin A1c dropped from 9 to 8.3. She used to have hemoglobin A1c 14. She is sleeping fine with the Klonopin. She denies any recent panic attack. She denies any crying spells. She feels her Klonopin and Cymbalta doing the job. She does not abuses her Klonopin or ask for early refill. She denies any agitation anger or mood swings. Her flashback nightmares are less intense and less frequent.  Past psychiatric history Patient has history of depression and posttraumatic stress disorder for past 16 years. Patient has been committed suicide in 1996. Asian was witnessed when she saw his body hanging. Patient has taken in the past Effexor Prozac and Celexa. Patient endorse history of severe flashback, hearing her husband voice and nightmares. She denies any history of previous suicidal attempt. Patient denies any previous inpatient psychiatric treatment.   Medical history Patient has history of hypothyroidism, insulin-dependent diabetes, obesity and hypertension. She see Dr. Steffanie Dunn in [place]. She has recently blood work. She also has macular degeneration in her both eyes that require regular laser surgery.  Alcohol and substance use history Patient denies any history of alcohol or illegal substance use.  Psychosocial history Patient was born and raised in [place]. She moved out at age 25. 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 She has one daughter who lives in [place]. Patient has no contact with her. Her husband was involved in computer IT business, he was very creative with very high IQ included many hardware computer patterns. 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  Mental status examination Patient is a middle-aged woman who is casually dressed and fairly groomed. She maintained good eye contact. Her speech is soft clear and coherent. She described her mood is anxious and her affect is constricted. She denies any active or passive suicidal thoughts or homicidal thoughts. She denies any auditory or visual hallucination. There no psychotic symptoms present. Her attention and concentration is fair. She has good fund of knowledge. Her thought process is logical linear and goal-directed. She's alert and oriented x3. Her insight judgment and impulse control is okay.  Assessment Axis I depressive disorder NOS, posttraumatic stress disorder Axis II deferred Axis III see medical history Axis IV mild to moderate Axis V 60-65  Plan This time patient is stable on her current medication. She has been compliant with Cymbalta and Klonopin. She denies any side effects of medication. I have recommended to have her blood work faxed to [place]. I have explained risks and benefits of medication. I will see her again in 2 months. Time spent 30 minutes

## 2011-10-19 ENCOUNTER — Encounter (HOSPITAL_COMMUNITY): Payer: Self-pay | Admitting: Psychiatry

## 2011-10-19 ENCOUNTER — Ambulatory Visit (INDEPENDENT_AMBULATORY_CARE_PROVIDER_SITE_OTHER): Payer: Medicaid Other | Admitting: Psychiatry

## 2011-10-19 DIAGNOSIS — F419 Anxiety disorder, unspecified: Secondary | ICD-10-CM

## 2011-10-19 DIAGNOSIS — F411 Generalized anxiety disorder: Secondary | ICD-10-CM

## 2011-10-19 MED ORDER — CLONAZEPAM 0.5 MG PO TABS: 0.5000 mg | ORAL_TABLET | Freq: Every evening | ORAL | Status: DC | PRN

## 2011-10-19 MED ORDER — DULOXETINE HCL 60 MG PO CPEP: 60.0000 mg | ORAL_CAPSULE | Freq: Every day | ORAL | Status: DC

## 2011-10-19 NOTE — Progress Notes (Signed)
Chief complaint  Medication management and followup.  History of presenting illness Patient is 51 year old Caucasian widowed woman who came for her appointment. Patient has been compliant with the Cymbalta and Klonopin. She continues to have residual anxiety and depression. She reported no side effects of medication. She is seeing eye doctor every month for laser surgery.   She feels very nervous at that time.  Recently her primary care started on Lyrica for for neuropathy.  Which helps some pain.She continues to have difficulty controlling her blood sugar. Despite watching her diet her blood sugar still fluctuates. She is sleeping fine with the Klonopin. She denies any recent panic attack. She denies any crying spells. She feels her Klonopin and Cymbalta doing the job. She does not abuses her Klonopin or ask for early refill. She denies any agitation anger or mood swings. Her flashback nightmares are less intense and less frequent.  Past psychiatric history Patient has history of depression and posttraumatic stress disorder for past 16 years. Patient has been committed suicide in 14-Oct-1994. Asian was witnessed when she saw his body hanging. Patient has taken in the past Effexor Prozac and Celexa. Patient endorse history of severe flashback, hearing her husband voice and nightmares. She denies any history of previous suicidal attempt. Patient denies any previous inpatient psychiatric treatment.   Family history Patient reported her mother suffered from significant depression and she died in 2000/10/13.  Medical history Patient has history of hypothyroidism, insulin-dependent diabetes, obesity and hypertension. She see Dr. Steffanie Dunn in [place]. She has recently blood work. She also has macular degeneration in her both eyes that require regular laser surgery.  Alcohol and substance use history Patient denies any history of alcohol or illegal substance use.  Psychosocial history Patient was born and raised  in [place]. She moved out at age 30. 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 She has one daughter who lives in [place]. Patient has no contact with her. Her husband was involved in computer IT business, he was very creative with very high IQ included many hardware computer patterns. 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  Mental status examination Patient is a middle-aged woman who is casually dressed and fairly groomed. She  is pleasant calm and cooperative.  She maintained good eye contact. Her speech is soft clear and coherent. She described her mood is anxious and her affect is constricted. She denies any active or passive suicidal thoughts or homicidal thoughts. She denies any auditory or visual hallucination. There no psychotic symptoms present. Her attention and concentration is fair. She has good fund of knowledge. Her thought process is logical linear and goal-directed. She's alert and oriented x3. Her insight judgment and impulse control is okay.  Assessment Axis I depressive disorder NOS, posttraumatic stress disorder Axis II deferred Axis III see medical history Axis IV mild to moderate Axis V 60-65  Plan This time patient is stable on her current medication. She has been compliant with Cymbalta and Klonopin. She denies any side effects of medication.   I recommended to take Klonopin every time before her injection in her eyes.  II have recommended to have her blood work faxed to [place]. I have explained risks and benefits of medication. I will see her again in 2 months.   She is seeing therapist in this office for coping and social skills.

## 2011-12-21 ENCOUNTER — Ambulatory Visit (INDEPENDENT_AMBULATORY_CARE_PROVIDER_SITE_OTHER): Payer: Self-pay | Admitting: Psychiatry

## 2011-12-21 ENCOUNTER — Encounter (HOSPITAL_COMMUNITY): Payer: Self-pay | Admitting: Psychiatry

## 2011-12-21 VITALS — BP 118/72 | HR 93

## 2011-12-21 DIAGNOSIS — F3289 Other specified depressive episodes: Secondary | ICD-10-CM

## 2011-12-21 DIAGNOSIS — F329 Major depressive disorder, single episode, unspecified: Secondary | ICD-10-CM

## 2011-12-21 DIAGNOSIS — F4310 Post-traumatic stress disorder, unspecified: Secondary | ICD-10-CM

## 2011-12-21 DIAGNOSIS — F419 Anxiety disorder, unspecified: Secondary | ICD-10-CM

## 2011-12-21 MED ORDER — CLONAZEPAM 0.5 MG PO TABS: 0.5000 mg | ORAL_TABLET | Freq: Every evening | ORAL | Status: DC | PRN

## 2011-12-21 MED ORDER — DULOXETINE HCL 60 MG PO CPEP: 60.0000 mg | ORAL_CAPSULE | Freq: Every day | ORAL | Status: DC

## 2011-12-21 NOTE — Progress Notes (Signed)
Chief complaint  Medication management and followup.  History of presenting illness Patient is 51 year old Caucasian widowed woman who came for her appointment. Patient has been compliant with the Cymbalta and Klonopin.  Patient has a foot surgery on May 30 and she still has walking cast on her foot.  Overall she noticed more sad and depressed but she believe due to tight control of her blood sugar.  Patient admitted that first time her blood sugar has been better control with insulin regime prescribed by her endocrinologist.  Patient admitted she having a hard time adjusting to this new [place] and some time she feel very sad depressed irritable and angry .  However she does not want to change the medication because she liked to have a better blood sugar .  She admitted some crying spells but overall she denies any severe anger or any active or passive suicidal thoughts.  She is not taking Lyrica due to swelling.  She wants to continue her current psychiatric medication.  She sleeping 6-7 hours without any problem.  Her vitals are stable.  Current psychiatric medication Cymbalta 60 mg daily Klonopin 0.5 mg as needed.  Past psychiatric history Patient has history of depression and posttraumatic stress disorder for past 16 years. Patient has been committed suicide in 10/29/94. Asian was witnessed when she saw his body hanging. Patient has taken in the past Effexor Prozac and Celexa. Patient endorse history of severe flashback, hearing her husband voice and nightmares. She denies any history of previous suicidal attempt. Patient denies any previous inpatient psychiatric treatment.   Family history Patient reported her mother suffered from significant depression and she died in 2000/10/28.  Medical history Patient has history of hypothyroidism, insulin-dependent diabetes, obesity and hypertension. She see Dr. Steffanie Dunn in [place]. She has recently blood work. She also has macular degeneration in her both eyes  that require regular laser surgery.  Recently she had foot surgery.  Alcohol and substance use history Patient denies any history of alcohol or illegal substance use.  Psychosocial history Patient was born and raised in [place]. She moved out at age 66. 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 She has one daughter who lives in [place]. Patient has no contact with her. Her husband was involved in computer IT business, he was very creative with very high IQ included many hardware computer patterns. 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  Mental status examination Patient is a middle-aged woman who is casually dressed and fairly groomed. She is calm and cooperative.  She is walking with walking cast.  She maintained fair eye contact. Her speech is soft clear and coherent. She described her mood is anxious and sad and her affect is constricted. She denies any active or passive suicidal thoughts or homicidal thoughts. She denies any auditory or visual hallucination. There no psychotic symptoms present. Her attention and concentration is fair. She has good fund of knowledge. Her thought process is logical linear and goal-directed. She's alert and oriented x3. Her insight judgment and impulse control is okay.  Assessment Axis I depressive disorder NOS, posttraumatic stress disorder Axis II deferred Axis III see medical history Axis IV mild to moderate Axis V 60-65  Plan I reviewed her current medication, psychosocial stressor and response to medication.  I agree with the patient that she has been trying to have a better control of her blood sugar which is causing some difficulty in adjustment in her  mood.  I recommend to carefully monitor her symptoms however if she started to get worse than she should call us immediately.  She wants to continue her current psychiatric medication .  I explained the risk and benefits of medication.  She is not taking  Lyrica .  I will see her again in 2 months.  Time spent 30 minutes.

## 2012-02-22 ENCOUNTER — Ambulatory Visit (HOSPITAL_COMMUNITY): Payer: Self-pay | Admitting: Psychiatry

## 2012-02-24 ENCOUNTER — Ambulatory Visit (INDEPENDENT_AMBULATORY_CARE_PROVIDER_SITE_OTHER): Payer: Self-pay | Admitting: Psychiatry

## 2012-02-24 ENCOUNTER — Encounter (HOSPITAL_COMMUNITY): Payer: Self-pay | Admitting: Psychiatry

## 2012-02-24 VITALS — BP 126/68 | HR 84 | Wt 166.6 lb

## 2012-02-24 DIAGNOSIS — F4310 Post-traumatic stress disorder, unspecified: Secondary | ICD-10-CM

## 2012-02-24 DIAGNOSIS — F419 Anxiety disorder, unspecified: Secondary | ICD-10-CM

## 2012-02-24 DIAGNOSIS — F3289 Other specified depressive episodes: Secondary | ICD-10-CM

## 2012-02-24 DIAGNOSIS — F329 Major depressive disorder, single episode, unspecified: Secondary | ICD-10-CM

## 2012-02-24 MED ORDER — DULOXETINE HCL 60 MG PO CPEP: 60.0000 mg | ORAL_CAPSULE | Freq: Every day | ORAL | Status: DC

## 2012-02-24 MED ORDER — CLONAZEPAM 0.5 MG PO TABS: 0.5000 mg | ORAL_TABLET | Freq: Every evening | ORAL | Status: DC | PRN

## 2012-02-24 NOTE — Progress Notes (Signed)
Chief complaint Medication management and followup.  History of presenting illness Patient is 51 year old Caucasian widowed woman who came for her appointment.  She is more stress in past few weeks.  She has episode been she was unable to see and she call her eye Dr. who now giving injections every 2 weeks.  She is very nervous about her vision which has been worse in past few weeks.  However she denies any pain or insomnia.  She likes her current psychiatric medication.  She is taking Klonopin every night for sleep.  She denies any crying spells but feel anxious about her future.  She is trying her blood sugar to be control and recently her readings are much better than past.  She denies an active or passive suicidal thoughts.  She denies any side effects of Cymbalta.  She's not drinking or using any illegal substance.  She does not abuse her Klonopin.  Her foot pain is much improved from the past.    Current psychiatric medication Cymbalta 60 mg daily Klonopin 0.5 mg as needed.  Past psychiatric history Patient has history of depression and posttraumatic stress disorder for past 16 years. Patient has been committed suicide in 10-29-1994. Asian was witnessed when she saw his body hanging. Patient has taken in the past Effexor Prozac and Celexa. Patient endorse history of severe flashback, hearing her husband voice and nightmares. She denies any history of previous suicidal attempt. Patient denies any previous inpatient psychiatric treatment.   Family history Patient reported her mother suffered from significant depression and she died in Oct 28, 2000.  Medical history Patient has history of hypothyroidism, insulin-dependent diabetes, obesity and hypertension. She see Dr. Steffanie Dunn in [place].  She has macular degeneration in her both eyes that require regular laser surgery.  Recently she had foot surgery.  Alcohol and substance use history Patient denies any history of alcohol or illegal substance  use.  Psychosocial history Patient was born and raised in [place]. She moved out at age 72. 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 She has one daughter who lives in [place]. Patient has no contact with her. Her husband was involved in computer IT business, he was very creative with very high IQ included many hardware computer patterns. 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  Mental status examination Patient is a middle-aged woman who is casually dressed and fairly groomed. She is anxious but cooperative.  She maintained fair eye contact. Her speech is soft clear and coherent. She described her mood is anxious and her affect is constricted. She denies any active or passive suicidal thoughts or homicidal thoughts. She denies any auditory or visual hallucination. There no psychotic symptoms present. Her attention and concentration is fair. She has good fund of knowledge. Her thought process is logical linear and goal-directed. She's alert and oriented x3. Her insight judgment and impulse control is okay.  Assessment Axis I depressive disorder NOS, posttraumatic stress disorder Axis II deferred Axis III see medical history Axis IV mild to moderate Axis V 60-65  Plan I reviewed her current medication, psychosocial stressor and response to medication.  I will continue her current psychiatric medication.  I recommend to monitor her blood pressure regularly.  I also recommend to call us if she has any question or concern about the medication or if she feel worsening of the symptoms.  Time spent 30 minutes.  I will see her again in 2 months.

## 2012-04-18 ENCOUNTER — Telehealth (HOSPITAL_COMMUNITY): Payer: Self-pay | Admitting: *Deleted

## 2012-04-18 ENCOUNTER — Other Ambulatory Visit (HOSPITAL_COMMUNITY): Payer: Self-pay | Admitting: Psychiatry

## 2012-04-18 NOTE — Telephone Encounter (Signed)
VM:Has been on Cymbalta 60mg  for some time.Has completely stopped working. Last two days-has had no effect.Feels like she will 'snap'.Wants to know if she can take 2 capsules daily until appt on 10/14?

## 2012-04-19 NOTE — Telephone Encounter (Signed)
Called returned.  Patient is more concern about her physical health.  She feel her Cymbalta is not working.  She is wondering if she can take to Cymbalta.  I recommend not to take 2 Cymbalta however take Klonopin 1 extra as needed.  She scheduled to see me on October 14.  We will discuss this issue more in detail.

## 2012-04-25 ENCOUNTER — Encounter (HOSPITAL_COMMUNITY): Payer: Self-pay | Admitting: Psychiatry

## 2012-04-25 ENCOUNTER — Ambulatory Visit (HOSPITAL_COMMUNITY): Payer: Self-pay | Admitting: Psychiatry

## 2012-04-25 ENCOUNTER — Ambulatory Visit (INDEPENDENT_AMBULATORY_CARE_PROVIDER_SITE_OTHER): Payer: Medicaid Other | Admitting: Psychiatry

## 2012-04-25 DIAGNOSIS — F329 Major depressive disorder, single episode, unspecified: Secondary | ICD-10-CM

## 2012-04-25 DIAGNOSIS — F4310 Post-traumatic stress disorder, unspecified: Secondary | ICD-10-CM

## 2012-04-25 DIAGNOSIS — F419 Anxiety disorder, unspecified: Secondary | ICD-10-CM

## 2012-04-25 DIAGNOSIS — F3289 Other specified depressive episodes: Secondary | ICD-10-CM

## 2012-04-25 MED ORDER — DIAZEPAM 5 MG PO TABS: 5.0000 mg | ORAL_TABLET | Freq: Two times a day (BID) | ORAL | Status: DC | PRN

## 2012-04-25 MED ORDER — DULOXETINE HCL 30 MG PO CPEP: ORAL_CAPSULE | ORAL | Status: DC

## 2012-04-25 NOTE — Progress Notes (Signed)
Chief complaint Medication management and followup.  History of presenting illness Patient is 51 year old Caucasian widowed woman who came for her appointment.  She is very concerned and stressed about her physical health.  She developed as is on her scalp which was initially drained and then she required surgery .  She was told that it was assessed however she still has open wound which is not close.  Patient has diabetes and she is very concerned about her nonhealing wound .  2 days ago she call us and very upset on her doctor , she also felt that her Cymbalta and Klonopin is not working.  She is having panic attack.  She's having poor sleep and anxiety symptoms and racing thoughts.  She was recommended to take 1 extra Klonopin.  Today she mentioned that taking extra Klonopin did not help her.  She is very nervous about her physical condition.  She has taken multiple antibiotic however her wound is not he's.  Patient is scheduled to see his doctor this coming Thursday.  She wants to try a different medication for her anxiety.  She denies any active or passive suicidal thoughts.  She denies any hallucination but endorse frustration and irritability.  She's not drinking or using any illegal substance.  She sleeping a few hours.  Current psychiatric medication Cymbalta 60 mg daily Klonopin 0.5 mg as needed.  Past psychiatric history Patient has history of depression and posttraumatic stress disorder for past 16 years. Patient has been committed suicide in 28-Oct-1994. Asian was witnessed when she saw his body hanging. Patient has taken in the past Effexor Prozac and Celexa. Patient endorse history of severe flashback, hearing her husband voice and nightmares. She denies any history of previous suicidal attempt. Patient denies any previous inpatient psychiatric treatment.   Family history Patient reported her mother suffered from significant depression and she died in 10/27/2000.  Medical history Patient has  history of hypothyroidism, insulin-dependent diabetes, obesity and hypertension. She see Dr. Steffanie Dunn in [place].  She has macular degeneration in her both eyes that require regular laser surgery.  Recently she had foot surgery and surgery for her sebaceous cyst.  Alcohol and substance use history Patient denies any history of alcohol or illegal substance use.  Psychosocial history Patient was born and raised in [place]. She moved out at age 62. 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 She has one daughter who lives in [place]. Patient has no contact with her. Her husband was involved in computer IT business, he was very creative with very high IQ included many hardware computer patterns. 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  Mental status examination Patient is a middle-aged woman who is casually dressed and fairly groomed.  She is very anxious and tense.  She is cooperative and maintained fair eye contact.  Her speech is soft clear and coherent. She described her mood is anxious and her affect is constricted. She denies any active or passive suicidal thoughts or homicidal thoughts. She denies any auditory or visual hallucination. There no psychotic symptoms present. Her attention and concentration is fair. She has good fund of knowledge. Her thought process is logical linear and goal-directed. She's alert and oriented x3. Her insight judgment and impulse control is okay.  Assessment Axis I depressive disorder NOS, posttraumatic stress disorder Axis II deferred Axis III see medical history Axis IV mild to moderate Axis V 60-65  Plan I reviewed her medical  history, psychosocial stressor and response to the medication.  I will discontinue her Klonopin but is not working.  I will try Valium 5 mg 1-2 tablet as needed for severe anxiety.  I would increase her Cymbalta to 90 mg.  I explained risks and benefits of medication.  I recommend to call  us if she is any question or concern about the medication if he feel worsening of the symptom.  I will also schedule appointment with the therapist.  I reinforce to see her medical doctor to discuss in detail about nonhealing wound .  More than 50% of the time spent in counseling, psychoeducation and coordination of care.  I will see her again in 3 weeks.

## 2012-05-17 ENCOUNTER — Ambulatory Visit (INDEPENDENT_AMBULATORY_CARE_PROVIDER_SITE_OTHER): Payer: Medicaid Other | Admitting: Psychiatry

## 2012-05-17 ENCOUNTER — Encounter (HOSPITAL_COMMUNITY): Payer: Self-pay | Admitting: Psychiatry

## 2012-05-17 DIAGNOSIS — F329 Major depressive disorder, single episode, unspecified: Secondary | ICD-10-CM

## 2012-05-17 DIAGNOSIS — F4310 Post-traumatic stress disorder, unspecified: Secondary | ICD-10-CM

## 2012-05-17 DIAGNOSIS — F3289 Other specified depressive episodes: Secondary | ICD-10-CM

## 2012-05-17 DIAGNOSIS — F419 Anxiety disorder, unspecified: Secondary | ICD-10-CM

## 2012-05-17 MED ORDER — DULOXETINE HCL 30 MG PO CPEP: ORAL_CAPSULE | ORAL | Status: DC

## 2012-05-17 MED ORDER — CLONAZEPAM 0.5 MG PO TABS: 0.5000 mg | ORAL_TABLET | Freq: Every day | ORAL | Status: DC

## 2012-05-17 NOTE — Progress Notes (Signed)
Chief complaint Better with Cymbalta but do not like Valium.  I like to go back on Klonopin.  I still have panic attack with Valium.  History of presenting illness Patient is 51 year old Caucasian widowed woman who came for her appointment.  On her last visit we stopped Klonopin as patient was feeling is not working and be increased Cymbalta to 90 mg.  Patient like increase Cymbalta but she do not feel much better with Valium.  She prefers to go back Klonopin which is helping her anxiety and sleep.  She continues to have stress about her open wound in her scalp but it is slowly healing.  Her wound is is still open but she is less painful.  She realized that she will get injection in her eyes regularly to prevent further vision loss.  She is tolerating Cymbalta.  She denies any tremors or shakes.  She's trying to get appointment with the therapist who she has seen in this office in the past.  She scheduled to see Otis Peak in January.  There has been no new medication added by her primary care physician.  She is less anxious less irritable and less tearful.  She denies any active or passive suicidal thoughts.  She wants to continue Cymbalta at present does.  Current psychiatric medication Cymbalta 60 mg daily Valium 2 mg but she stopped as it is not working.    Past psychiatric history Patient has history of depression and posttraumatic stress disorder for past 16 years. Patient husband committed suicide in 10-21-1994.  She witnessed when she saw his body hanging. Patient has taken in the past Effexor Prozac and Celexa. Patient endorse history of severe flashback, hearing her husband voice and nightmares. She denies any history of previous suicidal attempt. Patient denies any previous inpatient psychiatric treatment.   Family history Patient reported her mother suffered from significant depression and she died in 2000/10/20.  Medical history Patient has history of hypothyroidism, insulin-dependent diabetes, obesity  and hypertension. She see Dr. Steffanie Dunn in [place].  She has macular degeneration in her both eyes that require regular laser surgery.  Recently she had foot surgery and surgery for her sebaceous cyst.  Alcohol and substance use history Patient denies any history of alcohol or illegal substance use.  Psychosocial history Patient was born and raised in [place]. She moved out at age 80. 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 She has one daughter who lives in [place]. Patient has no contact with her. Her husband was involved in computer IT business, he was very creative with very high IQ included many hardware computer patterns. 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  Mental status examination Patient is a middle-aged woman who is casually dressed and fairly groomed.  She is more calm and cooperative.  She maintained good eye contact.  Her speech is soft clear and coherent. She described her mood is better and her affect is improved from the past.  She denies any active or passive suicidal thoughts or homicidal thoughts. She denies any auditory or visual hallucination. There no psychotic symptoms present. Her attention and concentration is fair. She has good fund of knowledge. Her thought process is logical linear and goal-directed. She's alert and oriented x3. Her insight judgment and impulse control is okay.  Assessment Axis I depressive disorder NOS, posttraumatic stress disorder Axis II deferred Axis III see medical history Axis IV mild to moderate Axis V 60-65  Plan  I will continue Cymbalta 90 mg daily.  I will discontinue Valium and restart Klonopin but it was helping her.  It is scheduled to see therapist in January.  I recommend to call us if she is any question or concern about the medication if she feels worsening of the symptom.  I will see her again in 2 months.

## 2012-06-22 ENCOUNTER — Ambulatory Visit (INDEPENDENT_AMBULATORY_CARE_PROVIDER_SITE_OTHER): Payer: Federal, State, Local not specified - Other | Admitting: Marriage and Family Therapist

## 2012-06-22 DIAGNOSIS — F411 Generalized anxiety disorder: Secondary | ICD-10-CM

## 2012-06-22 DIAGNOSIS — F331 Major depressive disorder, recurrent, moderate: Secondary | ICD-10-CM

## 2012-06-22 NOTE — Progress Notes (Signed)
THERAPIST PROGRESS UPDATE  Session Time:  1:00 - 2:00 p.m.  Participation Level: Active  Behavioral Response: CasualAlertAnxiety/Depression  Type of Therapy: Individual Therapy  Treatment Goals addressed: Coping  Interventions: Strength-based and Supportive  Summary: Suzanne Allen is a 51 y.o. female who presents with anxiety and depression.  She was referred by Dr. Adele Schilder.  Patient has not been in for a session since 04/20/11.  She reports Dr. Adele Schilder "strongly suggested" she come back for therapy.  Patient states she believes it had to do with how she was acting although patient was not specific. Patient reports she came back to therapy because her father told her she "dissociates from others in an unhealthy way."  Patient wanted to know if she has a problem with connecting with people. She reports being able to cut off (her words) very easily from others and has no friends.  She also reports she believes this is a problem for her because if anything happened to her father and she needed medical attention there would be no support for her.  Patient reports she has learned a lot about her family through her father relating to mental health history and that there is mental health problems on both sides of the family.  Patient has found out that her mother had a psychotic break during menopause.  She reports her father admits to mood swings but would never seek treatment.  She reports knowing nothing until a recent conversation with father.  Patient is getting along better with her father and he has been a good support for her.  Patient also reports she continues to take care of her disabled daughter.  Her other daughter continues to have no contact with patient and patient states she has let this situation go in order to cope.  She reports she became a grandmother by her adopted daughter and has little interest in taking care of her grandchild.  She reports it is too much stress and responsibility.   Patient did not discuss her health although patient has significant health problems.  Overall, patient reports she no longer has the energy to take care of others as she has done in the past.   Suicidal/Homicidal: Nowithout intent/plan  Therapist Response:   Assessed for suicide and patient has had passive thoughts as in the past e.g., "if I get sick it would be okay if my life was over."  Discussed at length patient's difficulty connecting with others.  Discussed from an introvert standpoint, because of taking care of a disabled child for 18 years and not having the energy it takes to develop friendships, that the friendships she has had seem to be one-sided.  Also talked about ways patient could get her needs met if she got sick e.g., retirement community, Merchant navy officer, meals-on-wheels, and funded transportation.  The purpose was to normalize patient's personality to show her that some of her avoiding others has nothing to do with a problem.  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  Discussed patient coming back to therapy.  Although she said little about committing, she did commit to coming in monthly as a way to connect with someone.  Plan: Return again in 1 month.  Diagnosis: Axis I: GAD; MDD, Moderate    Axis II: Deferred  Lorene Dy, LMFT 06/22/2012

## 2012-07-19 ENCOUNTER — Encounter (HOSPITAL_COMMUNITY): Payer: Self-pay | Admitting: Psychiatry

## 2012-07-19 ENCOUNTER — Ambulatory Visit (INDEPENDENT_AMBULATORY_CARE_PROVIDER_SITE_OTHER): Payer: Self-pay | Admitting: Psychiatry

## 2012-07-19 DIAGNOSIS — F329 Major depressive disorder, single episode, unspecified: Secondary | ICD-10-CM

## 2012-07-19 DIAGNOSIS — F419 Anxiety disorder, unspecified: Secondary | ICD-10-CM

## 2012-07-19 DIAGNOSIS — F4310 Post-traumatic stress disorder, unspecified: Secondary | ICD-10-CM

## 2012-07-19 DIAGNOSIS — F3289 Other specified depressive episodes: Secondary | ICD-10-CM

## 2012-07-19 MED ORDER — DULOXETINE HCL 30 MG PO CPEP: ORAL_CAPSULE | ORAL | Status: DC

## 2012-07-19 MED ORDER — CLONAZEPAM 0.5 MG PO TABS: 0.5000 mg | ORAL_TABLET | Freq: Every day | ORAL | Status: DC

## 2012-07-19 NOTE — Progress Notes (Signed)
Chief complaint I still feel nervous about my physical health but overall my depression is better.  History of presenting illness Patient is 52 year old Caucasian widowed woman who came for her appointment.  On her last visit we started her on Klonopin as she was not feeling good with Valium.  She is feeling better with Klonopin.  She still has a lot of physical health issues.  However she is relieved that her scalp mood is finally healed. She is wearing cast on her foot .  She denies any recent panic attack and her sleep is much improved from the past.  However she admitted that she continued to struggle with her physical health.  She denies any side effects of medication.  She is seeing therapist and feeling better and like to see her more often.  She's not drinking or using any illegal substance.  Current psychiatric medication Cymbalta 90 mg daily Klonopin 0.5 mg at bedtime    Past psychiatric history Patient has history of depression and posttraumatic stress disorder for past 16 years. Patient husband committed suicide in October 23, 1994.  She witnessed when she saw his body hanging. Patient has taken in the past Effexor Prozac and Celexa. Patient endorse history of severe flashback, hearing her husband voice and nightmares. She denies any history of previous suicidal attempt. Patient denies any previous inpatient psychiatric treatment.   Family history Patient reported her mother suffered from significant depression and she died in 10-22-2000.  Medical history Patient has history of hypothyroidism, insulin-dependent diabetes, obesity and hypertension. She see Dr. Steffanie Dunn in [place].  She has macular degeneration in her both eyes that require regular laser surgery.  Recently she had foot surgery and surgery for her sebaceous cyst.  Alcohol and substance use history Patient denies any history of alcohol or illegal substance use.  Psychosocial history Patient was born and raised in [place]. She moved out at age 61. 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 She has one daughter who lives in [place]. Patient has no contact with her. Her husband was involved in computer IT business, he was very creative with very high IQ included many hardware computer patterns. 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  Mental status examination Patient is a middle-aged woman who is casually dressed and fairly groomed.  She is calm and cooperative.  She maintained good eye contact.  Her speech is soft clear and coherent. She described her mood is better and her affect is improved from the past.  She denies any active or passive suicidal thoughts or homicidal thoughts. She denies any auditory or visual hallucination. There no psychotic symptoms present. Her attention and concentration is fair. She has good fund of knowledge. Her thought process is logical linear and goal-directed. She's alert and oriented x3. Her insight judgment and impulse control is okay.  Assessment Axis I depressive disorder NOS, posttraumatic stress disorder Axis II deferred Axis III see medical history Axis IV mild to moderate Axis V 60-65  Plan I will continue Cymbalta 90 mg daily and Klonopin 0.5 mg at bedtime.  She will see therapist for coping and social skills.  I will see her again in 2 months.  I recommend to call us if she feels any worsening of the symptom or if she has any question.

## 2012-07-26 ENCOUNTER — Ambulatory Visit (HOSPITAL_COMMUNITY): Payer: Self-pay | Admitting: Marriage and Family Therapist

## 2012-07-26 NOTE — Progress Notes (Signed)
This encounter was created in error - please disregard.

## 2012-07-27 DIAGNOSIS — E11621 Type 2 diabetes mellitus with foot ulcer: Secondary | ICD-10-CM | POA: Insufficient documentation

## 2012-07-27 DIAGNOSIS — L97509 Non-pressure chronic ulcer of other part of unspecified foot with unspecified severity: Secondary | ICD-10-CM | POA: Insufficient documentation

## 2012-08-02 ENCOUNTER — Encounter (HOSPITAL_BASED_OUTPATIENT_CLINIC_OR_DEPARTMENT_OTHER): Payer: Medicaid Other | Attending: General Surgery

## 2012-08-02 DIAGNOSIS — Z794 Long term (current) use of insulin: Secondary | ICD-10-CM | POA: Insufficient documentation

## 2012-08-02 DIAGNOSIS — Z79899 Other long term (current) drug therapy: Secondary | ICD-10-CM | POA: Insufficient documentation

## 2012-08-02 DIAGNOSIS — X58XXXA Exposure to other specified factors, initial encounter: Secondary | ICD-10-CM | POA: Insufficient documentation

## 2012-08-02 DIAGNOSIS — I10 Essential (primary) hypertension: Secondary | ICD-10-CM | POA: Insufficient documentation

## 2012-08-02 DIAGNOSIS — E1069 Type 1 diabetes mellitus with other specified complication: Secondary | ICD-10-CM | POA: Insufficient documentation

## 2012-08-02 DIAGNOSIS — E785 Hyperlipidemia, unspecified: Secondary | ICD-10-CM | POA: Insufficient documentation

## 2012-08-03 ENCOUNTER — Ambulatory Visit (HOSPITAL_COMMUNITY)
Admission: RE | Admit: 2012-08-03 | Discharge: 2012-08-03 | Disposition: A | Discharge status: Home / Self Care | Payer: Medicaid Other | Source: Ambulatory Visit | Attending: General Surgery | Admitting: General Surgery

## 2012-08-03 ENCOUNTER — Other Ambulatory Visit (HOSPITAL_BASED_OUTPATIENT_CLINIC_OR_DEPARTMENT_OTHER): Payer: Self-pay | Admitting: General Surgery

## 2012-08-03 DIAGNOSIS — M869 Osteomyelitis, unspecified: Secondary | ICD-10-CM

## 2012-08-03 DIAGNOSIS — L989 Disorder of the skin and subcutaneous tissue, unspecified: Secondary | ICD-10-CM | POA: Insufficient documentation

## 2012-08-03 NOTE — H&P (Signed)
NAME:  Suzanne Allen, BROWDY NO.:  [id number]  MEDICAL RECORD NO.:  [id number]  LOCATION:  FOOT                         FACILITY:  [facility]  PHYSICIAN:  Elesa Hacker, M.D.        DATE OF BIRTH:  July 24, 1960  DATE OF ADMISSION:  08/02/2012 DATE OF DISCHARGE:                             HISTORY & PHYSICAL   CHIEF COMPLAINT:  Sore on the left great toe.  HISTORY OF PRESENT ILLNESS:  This is a type 1 diabetic, 1st diagnosed at age 52.  Approximately 1 year ago, she had an ulceration of the right great toe, which treated current cell this was treated with a cast and with biological skin substitutes it was healed for several days and since then has been recurrent.  PAST MEDICAL HISTORY:  Known diabetes type 1, nontoxic nodular goiter, hypertension, hyperlipidemia, allergic rhinitis, and vitamin D deficiency.  PAST SURGICAL HISTORY:  Thyroid removed and cyst removed from head.  SOCIAL HISTORY:  Cigarettes none.  Alcohol none.  ALLERGIES:  Erythromycin.  MEDICATIONS:  Keflex, Klonopin, Cymbalta, Lantus, Humalog, Synthroid, lisinopril, and simvastatin.  REVIEW OF SYSTEMS:  Essentially as above.  PHYSICAL EXAMINATION:  VITAL SIGNS:  Pulse 82, respirations 16, blood pressure 105/64.  Glucose is 220. GENERAL:  The patient is well developed, well nourished, in no distress. CRANIUM:  Normocephalic. CHEST:  Clear. HEART:  Regular rhythm. ABDOMEN:  Not examined. EXTREMITIES:  Examination of the lower extremities reveals very good peripheral pulses.  ABI measures 1.16.  On the right great toe, there is a 1.7 x 2.9 very superficial lesion with a shaggy base.  This is surrounded by thick callus.  The callus was excised and the base was curetted.  IMPRESSION:  Diabetic foot ulcer, Wagner III.  PLAN OF TREATMENT:  We will start Santyl.  We will get x-rays of the bones and rule out osteomyelitis.  We will start with Endo foam since the base is clean.  We will get x-rays of the  foot rule out osteo.  We have given her a Darco shoe.  Consideration of hyperbaric oxygen or total contact casting is in the future.     Elesa Hacker, M.D.     RA/MEDQ  D:  08/02/2012  T:  08/03/2012  Job:  [id number]

## 2012-08-09 ENCOUNTER — Other Ambulatory Visit (HOSPITAL_BASED_OUTPATIENT_CLINIC_OR_DEPARTMENT_OTHER): Payer: Self-pay | Admitting: General Surgery

## 2012-08-09 DIAGNOSIS — M869 Osteomyelitis, unspecified: Secondary | ICD-10-CM

## 2012-08-11 ENCOUNTER — Ambulatory Visit (HOSPITAL_COMMUNITY)
Admission: RE | Admit: 2012-08-11 | Discharge: 2012-08-11 | Disposition: A | Discharge status: Home / Self Care | Payer: Medicaid Other | Source: Ambulatory Visit | Attending: General Surgery | Admitting: General Surgery

## 2012-08-11 DIAGNOSIS — E119 Type 2 diabetes mellitus without complications: Secondary | ICD-10-CM | POA: Insufficient documentation

## 2012-08-11 DIAGNOSIS — X58XXXA Exposure to other specified factors, initial encounter: Secondary | ICD-10-CM | POA: Insufficient documentation

## 2012-08-11 DIAGNOSIS — S91109A Unspecified open wound of unspecified toe(s) without damage to nail, initial encounter: Secondary | ICD-10-CM | POA: Insufficient documentation

## 2012-08-11 DIAGNOSIS — L03039 Cellulitis of unspecified toe: Secondary | ICD-10-CM | POA: Insufficient documentation

## 2012-08-11 DIAGNOSIS — L02619 Cutaneous abscess of unspecified foot: Secondary | ICD-10-CM | POA: Insufficient documentation

## 2012-08-11 MED ORDER — GADOBENATE DIMEGLUMINE 529 MG/ML IV SOLN
15.0000 mL | Freq: Once | INTRAVENOUS | Status: AC | PRN
  Administered 2012-08-11: 15 mL via INTRAVENOUS

## 2012-08-13 ENCOUNTER — Ambulatory Visit (HOSPITAL_COMMUNITY): Admission: RE | Admit: 2012-08-13 | Payer: Medicaid Other | Source: Ambulatory Visit

## 2012-08-16 ENCOUNTER — Encounter (HOSPITAL_BASED_OUTPATIENT_CLINIC_OR_DEPARTMENT_OTHER): Payer: Medicaid Other | Attending: General Surgery

## 2012-08-16 DIAGNOSIS — E1169 Type 2 diabetes mellitus with other specified complication: Secondary | ICD-10-CM | POA: Insufficient documentation

## 2012-08-16 DIAGNOSIS — L84 Corns and callosities: Secondary | ICD-10-CM | POA: Insufficient documentation

## 2012-08-16 DIAGNOSIS — L97509 Non-pressure chronic ulcer of other part of unspecified foot with unspecified severity: Secondary | ICD-10-CM | POA: Insufficient documentation

## 2012-08-18 ENCOUNTER — Other Ambulatory Visit (HOSPITAL_BASED_OUTPATIENT_CLINIC_OR_DEPARTMENT_OTHER): Payer: Self-pay | Admitting: General Surgery

## 2012-08-18 ENCOUNTER — Ambulatory Visit (HOSPITAL_COMMUNITY)
Admission: RE | Admit: 2012-08-18 | Discharge: 2012-08-18 | Disposition: A | Discharge status: Home / Self Care | Payer: Medicaid Other | Source: Ambulatory Visit | Attending: General Surgery | Admitting: General Surgery

## 2012-08-18 DIAGNOSIS — T148XXA Other injury of unspecified body region, initial encounter: Secondary | ICD-10-CM

## 2012-08-18 DIAGNOSIS — Z01818 Encounter for other preprocedural examination: Secondary | ICD-10-CM | POA: Insufficient documentation

## 2012-08-18 DIAGNOSIS — X58XXXA Exposure to other specified factors, initial encounter: Secondary | ICD-10-CM | POA: Insufficient documentation

## 2012-08-29 ENCOUNTER — Telehealth: Payer: Self-pay | Admitting: *Deleted

## 2012-08-29 ENCOUNTER — Encounter: Payer: Self-pay | Admitting: Infectious Diseases

## 2012-08-29 ENCOUNTER — Ambulatory Visit (INDEPENDENT_AMBULATORY_CARE_PROVIDER_SITE_OTHER): Payer: Medicaid Other | Admitting: Infectious Diseases

## 2012-08-29 VITALS — BP 125/82 | HR 85 | Temp 98.1°F | Ht 63.0 in | Wt 158.0 lb

## 2012-08-29 DIAGNOSIS — M9080 Osteopathy in diseases classified elsewhere, unspecified site: Secondary | ICD-10-CM

## 2012-08-29 DIAGNOSIS — IMO0001 Reserved for inherently not codable concepts without codable children: Secondary | ICD-10-CM | POA: Insufficient documentation

## 2012-08-29 DIAGNOSIS — IMO0002 Reserved for concepts with insufficient information to code with codable children: Secondary | ICD-10-CM

## 2012-08-29 DIAGNOSIS — M869 Osteomyelitis, unspecified: Secondary | ICD-10-CM

## 2012-08-29 DIAGNOSIS — E1169 Type 2 diabetes mellitus with other specified complication: Secondary | ICD-10-CM

## 2012-08-29 DIAGNOSIS — E1065 Type 1 diabetes mellitus with hyperglycemia: Secondary | ICD-10-CM | POA: Insufficient documentation

## 2012-08-29 DIAGNOSIS — E1022 Type 1 diabetes mellitus with diabetic chronic kidney disease: Secondary | ICD-10-CM | POA: Insufficient documentation

## 2012-08-29 DIAGNOSIS — E119 Type 2 diabetes mellitus without complications: Secondary | ICD-10-CM | POA: Insufficient documentation

## 2012-08-29 MED ORDER — CIPROFLOXACIN HCL 500 MG PO TABS: 500.0000 mg | ORAL_TABLET | Freq: Two times a day (BID) | ORAL | Status: DC

## 2012-08-29 MED ORDER — SODIUM CHLORIDE 0.9 % IV SOLN: INTRAVENOUS | Status: DC

## 2012-08-29 NOTE — Addendum Note (Signed)
Addended by: Havannah Streat C on: 08/29/2012 03:54 PM   Modules accepted: Orders

## 2012-08-29 NOTE — Progress Notes (Signed)
  Subjective:    Patient ID: Suzanne Allen, female    DOB: 08-19-1960, 52 y.o.   MRN: [id number]  HPI 52 yo F with hx of anxiety, DM1 (since 52 yo) and 1 yr of wound on R great toe. Has had repeated debridements of her toe.  Had amnio-derma-graft (June 2013) and was in cast (for 6 weeks) and it healed. After 1 week had cracked again and has since ulcerated and "turned into a bone infection".   MRI on 1-0-14 showed: 1. Right great toe cellulitis. No evidence of abscess.  2. Abnormal marrow edema and enhancement throughout the distal  phalanx of the great toe with suspected early cortical erosion at  the tuft, suspicious for oteomyelitis. The proximal phalanx and  first metatarsal appear normal.  ROS: Was on keflex December 2013, was then seen in wound center. Has had erythema extending to her ankle previously. No wound d/c.  Has hx of neuropathy as well. FSG runs in the 200 range unless he has an infxn (400s). HgBA1C 13.7% . Cr 0.92. (08-03-12). Sees ophtho every 4 weeks.   Review of Systems  Constitutional: Negative for fever, chills, appetite change and unexpected weight change.  Gastrointestinal: Negative for diarrhea and constipation.  Genitourinary: Negative for dysuria.       Objective:   Physical Exam  Constitutional: She appears well-developed and well-nourished.  HENT:  Mouth/Throat: No oropharyngeal exudate.  Eyes: EOM are normal. Pupils are equal, round, and reactive to light.  Cardiovascular: Normal rate, regular rhythm and normal heart sounds.   Pulses:      Posterior tibial pulses are 2+ on the right side.  Pulmonary/Chest: Effort normal and breath sounds normal.  Abdominal: Soft. Bowel sounds are normal. There is no tenderness.  Musculoskeletal:       Feet:  Lymphadenopathy:    She has no cervical adenopathy.  Neurological: A sensory deficit is present.  No light touch in LE          Assessment & Plan:

## 2012-08-29 NOTE — Assessment & Plan Note (Signed)
Will cc: her PCP.

## 2012-08-29 NOTE — Telephone Encounter (Signed)
Called patient and left voice mail regarding her appt at [facility] for 08/31/12 at 12:00 pm for picc line placement. She is also scheduled to receive her first dose of vancomycin in short stay. Order faxed to [place] for IV antibiotic administration and care. Patient has not returned my calls. Left detailed information regarding the picc placement appointment. Myrtis Hopping CMA

## 2012-08-29 NOTE — Assessment & Plan Note (Signed)
Spoke with her about what she needs to heal her foot- judicious anbx, diabetic control, good wound care. Spoke with her about treatment options (po vs IV). She agrees to [facility] and IV therapy for 6 weeks, I explained this process to her . Will start her on vanco IV and cipro po. Will see her back in 3 weeks to assess her tolerance .

## 2012-08-30 ENCOUNTER — Other Ambulatory Visit (HOSPITAL_COMMUNITY): Payer: Self-pay | Admitting: Infectious Diseases

## 2012-08-31 ENCOUNTER — Encounter (HOSPITAL_COMMUNITY)
Admission: RE | Admit: 2012-08-31 | Discharge: 2012-08-31 | Disposition: A | Discharge status: Home / Self Care | Payer: Medicaid Other | Source: Ambulatory Visit | Attending: Infectious Diseases | Admitting: Infectious Diseases

## 2012-08-31 ENCOUNTER — Ambulatory Visit (HOSPITAL_COMMUNITY): Admission: RE | Admit: 2012-08-31 | Payer: Medicaid Other | Source: Ambulatory Visit

## 2012-08-31 ENCOUNTER — Ambulatory Visit (HOSPITAL_COMMUNITY)
Admission: RE | Admit: 2012-08-31 | Discharge: 2012-08-31 | Disposition: A | Discharge status: Home / Self Care | Payer: Medicaid Other | Source: Ambulatory Visit | Attending: Infectious Diseases | Admitting: Infectious Diseases

## 2012-08-31 ENCOUNTER — Other Ambulatory Visit: Payer: Self-pay | Admitting: Infectious Diseases

## 2012-08-31 DIAGNOSIS — E1169 Type 2 diabetes mellitus with other specified complication: Secondary | ICD-10-CM

## 2012-08-31 DIAGNOSIS — M869 Osteomyelitis, unspecified: Secondary | ICD-10-CM | POA: Insufficient documentation

## 2012-08-31 HISTORY — PX: PERIPHERALLY INSERTED CENTRAL CATHETER INSERTION: SHX2221

## 2012-08-31 MED ORDER — HEPARIN SOD (PORK) LOCK FLUSH 100 UNIT/ML IV SOLN: 250.0000 [IU] | INTRAVENOUS | Status: DC | PRN

## 2012-08-31 MED ORDER — SODIUM CHLORIDE 0.9 % IJ SOLN
10.0000 mL | INTRAMUSCULAR | Status: DC | PRN
  Administered 2012-08-31: 10 mL via INTRAVENOUS

## 2012-08-31 MED ORDER — HEPARIN SOD (PORK) LOCK FLUSH 100 UNIT/ML IV SOLN
250.0000 [IU] | Freq: Once | INTRAVENOUS | Status: AC
  Administered 2012-08-31: 250 [IU] via INTRAVENOUS
  Filled 2012-08-31: qty 5

## 2012-08-31 MED ORDER — SODIUM CHLORIDE 0.9 % IV SOLN
Freq: Once | INTRAVENOUS | Status: AC
  Administered 2012-08-31: 12:00:00 via INTRAVENOUS

## 2012-08-31 MED ORDER — SODIUM CHLORIDE 0.9 % IV SOLN: Freq: Once | INTRAVENOUS | Status: DC

## 2012-08-31 MED ORDER — VANCOMYCIN HCL IN DEXTROSE 1-5 GM/200ML-% IV SOLN
1000.0000 mg | Freq: Once | INTRAVENOUS | Status: AC
  Administered 2012-08-31: 1000 mg via INTRAVENOUS
  Filled 2012-08-31: qty 200

## 2012-08-31 MED ORDER — SODIUM CHLORIDE 0.9 % IJ SOLN: 10.0000 mL | INTRAMUSCULAR | Status: DC | PRN

## 2012-08-31 MED ORDER — LIDOCAINE HCL 1 % IJ SOLN
INTRAMUSCULAR | Status: AC
  Filled 2012-08-31: qty 20

## 2012-08-31 MED ORDER — DIPHENHYDRAMINE HCL 50 MG/ML IJ SOLN
25.0000 mg | Freq: Once | INTRAMUSCULAR | Status: AC
  Administered 2012-08-31: 25 mg via INTRAVENOUS

## 2012-08-31 MED ORDER — DIPHENHYDRAMINE HCL 50 MG/ML IJ SOLN
INTRAMUSCULAR | Status: AC
  Filled 2012-08-31: qty 1

## 2012-08-31 NOTE — Procedures (Signed)
US/fluoroscopic guided right SL brachial vein PICC placed. Length 41 cm. Tip SVC/RA junction. No immediate complications.

## 2012-08-31 NOTE — Progress Notes (Signed)
Pt states Itching is relieved and redness is gone.  Pt has no complaints at this time.  VSS.  Reaction and intervention written on d/c instructions for [facility] nurse to review.  Pt dc/d to home with her father.

## 2012-08-31 NOTE — Progress Notes (Signed)
At end of Vancomycin infusion pt developed itching and redness on back of neck and shoulders and redness only on chest area.  Pt did NOT develop hives, welps or SOB.  Pt's BP 104/59  HR 85.  Dr. Johnnye Sima notified and IV bendadryl orders given.  Will continue to monitor pt.

## 2012-09-13 ENCOUNTER — Encounter (HOSPITAL_BASED_OUTPATIENT_CLINIC_OR_DEPARTMENT_OTHER): Payer: Medicaid Other | Attending: General Surgery

## 2012-09-13 DIAGNOSIS — E1169 Type 2 diabetes mellitus with other specified complication: Secondary | ICD-10-CM | POA: Insufficient documentation

## 2012-09-13 DIAGNOSIS — M869 Osteomyelitis, unspecified: Secondary | ICD-10-CM | POA: Insufficient documentation

## 2012-09-13 DIAGNOSIS — L97509 Non-pressure chronic ulcer of other part of unspecified foot with unspecified severity: Secondary | ICD-10-CM | POA: Insufficient documentation

## 2012-09-14 ENCOUNTER — Other Ambulatory Visit: Payer: Self-pay | Admitting: Infectious Diseases

## 2012-09-14 ENCOUNTER — Ambulatory Visit (INDEPENDENT_AMBULATORY_CARE_PROVIDER_SITE_OTHER): Payer: Medicaid Other | Admitting: Infectious Diseases

## 2012-09-14 ENCOUNTER — Encounter: Payer: Self-pay | Admitting: Infectious Diseases

## 2012-09-14 VITALS — BP 116/78 | HR 87 | Temp 98.2°F | Ht 63.0 in | Wt 155.0 lb

## 2012-09-14 DIAGNOSIS — M9080 Osteopathy in diseases classified elsewhere, unspecified site: Secondary | ICD-10-CM

## 2012-09-14 DIAGNOSIS — E1169 Type 2 diabetes mellitus with other specified complication: Secondary | ICD-10-CM

## 2012-09-14 DIAGNOSIS — M869 Osteomyelitis, unspecified: Secondary | ICD-10-CM

## 2012-09-14 LAB — CBC
HCT: 42.5 % (ref 36.0–46.0)
Hemoglobin: 14.8 g/dL (ref 12.0–15.0)
MCH: 31.2 pg (ref 26.0–34.0)
MCHC: 34.8 g/dL (ref 30.0–36.0)
MCV: 89.7 fL (ref 78.0–100.0)
Platelets: 328 10*3/uL (ref 150–400)
RBC: 4.74 MIL/uL (ref 3.87–5.11)
RDW: 13 % (ref 11.5–15.5)
WBC: 7.4 10*3/uL (ref 4.0–10.5)

## 2012-09-14 LAB — BASIC METABOLIC PANEL
BUN: 15 mg/dL (ref 6–23)
CO2: 28 mEq/L (ref 19–32)
Calcium: 9.7 mg/dL (ref 8.4–10.5)
Chloride: 96 mEq/L (ref 96–112)
Creat: 0.8 mg/dL (ref 0.50–1.10)
Glucose, Bld: 288 mg/dL — ABNORMAL HIGH (ref 70–99)
Potassium: 3.8 mEq/L (ref 3.5–5.3)
Sodium: 137 mEq/L (ref 135–145)

## 2012-09-14 LAB — C-REACTIVE PROTEIN: CRP: 1.7 mg/dL — ABNORMAL HIGH (ref <0.60)

## 2012-09-14 LAB — SEDIMENTATION RATE: Sed Rate: 45 mm/hr — ABNORMAL HIGH (ref 0–22)

## 2012-09-14 NOTE — Progress Notes (Signed)
[facility]  NAME:  Suzanne Allen, Suzanne Allen                      ACCOUNT NO.:  MEDICAL RECORD NO.:  [id number]      DATE OF BIRTH:  [birth year]  PHYSICIAN:  Elesa Hacker, M.D.         VISIT DATE:  09/13/2012                                  OFFICE VISIT   To Whom It May Concern:  Suzanne Allen is a type 2 diabetic 52 year old woman with a diabetic foot ulcer Wagner 3 right great toe.  This has responded very slowly to regular wound care therapy.  An MRI done reveals osteomyelitis of the distal phalanx of the right great toe.  She has been started on intravenous antibiotics, and we believe that HBO is indicated for limb salvage.     Elesa Hacker, M.D.     RA/MEDQ  D:  09/13/2012  T:  09/14/2012  Job:  [id number]

## 2012-09-14 NOTE — Assessment & Plan Note (Addendum)
Her toe appears to be doing well. Will check her CBC, BMP, ESR and CRP. She is about 2-3 hours from her last dose of anbx. Explained to her that drawing her vanco level now would give [place] a peak and we are most interested in trough. Will speak with [facility] and get a trough done. See her back in 4 weeks to re-assess.

## 2012-09-14 NOTE — Progress Notes (Signed)
  Subjective:    Patient ID: Suzanne Allen, female    DOB: [birth year], 52 y.o.   MRN: [id number]  HPI 52 yo F with hx of anxiety, DM1 (since 52 yo) and 1 yr of wound on R great toe. Has had repeated debridements of her toe.  Had amnio-derma-graft (June 2013) and was in cast (for 6 weeks) and it healed. After 1 week had cracked again and has since ulcerated and "turned into a bone infection".  MRI on 1-0-14 showed: 1. Right great toe cellulitis. No evidence of abscess. 2. Abnormal marrow edema and enhancement throughout the distal phalanx of the great toe with suspected early cortical erosion at  the tuft, suspicious for oteomyelitis. The proximal phalanx and  first metatarsal appear normal.  Started on vanco/oral cipro on 08-29-12 with a plan for 6 weeks of therapy. Today is day 14 of rx per pt.  Has been doing ok. Has had no problems with her PIC- no d/c, no erythema. No f/c. The wound on her toe is unchanged. No d/c except as noted in her wound dressings. Her wt bearing is unchanged (prohibited by her wound MDs). Has had a problem with getting her labs done.   Review of Systems     Objective:   Physical Exam  Musculoskeletal:       Arms:      Feet:          Assessment & Plan:

## 2012-09-19 ENCOUNTER — Ambulatory Visit (INDEPENDENT_AMBULATORY_CARE_PROVIDER_SITE_OTHER): Payer: Medicaid Other | Admitting: Psychiatry

## 2012-09-19 ENCOUNTER — Encounter (HOSPITAL_COMMUNITY): Payer: Self-pay | Admitting: Psychiatry

## 2012-09-19 DIAGNOSIS — F4310 Post-traumatic stress disorder, unspecified: Secondary | ICD-10-CM

## 2012-09-19 DIAGNOSIS — F3289 Other specified depressive episodes: Secondary | ICD-10-CM

## 2012-09-19 DIAGNOSIS — F419 Anxiety disorder, unspecified: Secondary | ICD-10-CM

## 2012-09-19 DIAGNOSIS — F329 Major depressive disorder, single episode, unspecified: Secondary | ICD-10-CM

## 2012-09-19 MED ORDER — CLONAZEPAM 0.5 MG PO TABS: 0.5000 mg | ORAL_TABLET | Freq: Every day | ORAL | Status: DC

## 2012-09-19 MED ORDER — DULOXETINE HCL 30 MG PO CPEP: ORAL_CAPSULE | ORAL | Status: DC

## 2012-09-19 NOTE — Progress Notes (Signed)
Chief complaint I have osteomyelitis and I'm taking intravenous antibiotic.  History of presenting illness Patient is 52 year old Caucasian widowed woman who came for her appointment.  Patient is compliant with her Klonopin and Cymbalta.  Recently she was diagnosed with osteomyelitis and she has a PICC line and getting intravenous vancomycin.  She's also taking oral ciprofloxacin.  She's more anxious but feel her current medication is working very well.  She denies any agitation anger mood swing.  She sleeping better.  She need another 4 weeks of intravenous treatment.  She concerned about her physical health but hoping to get better.  She denies any recent panic attack or any anxiety attack.  She denies any side effects of medication.  Current psychiatric medication Cymbalta 90 mg daily Klonopin 0.5 mg at bedtime    Past psychiatric history Patient has history of depression and posttraumatic stress disorder for past 16 years. Patient husband committed suicide in 1994-10-12.  She witnessed when she saw his body hanging. Patient has taken in the past Effexor Prozac and Celexa. Patient endorse history of severe flashback, hearing her husband voice and nightmares. She denies any history of previous suicidal attempt. Patient denies any previous inpatient psychiatric treatment.   Family history Patient reported her mother suffered from significant depression and she died in October 11, 2000.  Medical history Recently she is diagnosed with osteoarthritis .  She also has history of hypothyroidism, insulin-dependent diabetes, obesity and hypertension. She see Dr. Steffanie Dunn in [place].  She has macular degeneration in her both eyes that require regular laser surgery.    Alcohol and substance use history Patient denies any history of alcohol or illegal substance use.  Psychosocial history Patient was born and raised in [place]. She moved out at age 70. 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 She has one daughter who lives in [place]. Patient has no contact with her. Her husband was involved in computer IT business, he was very creative with very high IQ included many hardware computer patterns. 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  Mental status examination Patient is a middle-aged woman who is casually dressed and fairly groomed.  She is anxious but cooperative. She maintained good eye contact.  Her speech is soft clear and coherent. She described her mood anxious and her affect is mood appropriate.  She denies any active or passive suicidal thoughts or homicidal thoughts. She denies any auditory or visual hallucination. There no psychotic symptoms present. Her attention and concentration is fair. She has good fund of knowledge. Her thought process is logical linear and goal-directed. She's alert and oriented x3. Her insight judgment and impulse control is okay.  Assessment Axis I depressive disorder NOS, posttraumatic stress disorder Axis II deferred Axis III see medical history Axis IV mild to moderate Axis V 60-65  Plan I will continue Cymbalta 90 mg daily and Klonopin 0.5 mg at bedtime.  At this time she does not have time to see therapist as she is more busy with her doctor's appointment.  However she we'll consider in the future if needed.  Recommend to call us back if she is any question or concern or feel worsening of the symptoms.  I will see her again in 2 months.

## 2012-09-21 ENCOUNTER — Telehealth: Payer: Self-pay | Admitting: Infectious Diseases

## 2012-09-21 NOTE — Telephone Encounter (Signed)
Called by home health pharmacy. Pt having rash not responding to benadryl. Have asked pharmacy/her to stop anbx and make office appt.

## 2012-09-21 NOTE — Progress Notes (Signed)
[facility]  NAME:  Suzanne Allen NO.:  [id number]  MEDICAL RECORD NO.:  [id number]      DATE OF BIRTH:  1961-06-12  PHYSICIAN:  Elesa Hacker, M.D.         VISIT DATE:  09/20/2012                                  OFFICE VISIT   To Whom It May Concern:  Ms. Suzanne Allen with a diagnosis of diabetes mellitus for many years, was out of glycemic control, but has now been treated by her medical doctor, and her fasting glucoses are about 200.  Lower extremity vascular assessment reveals an ABI of 1.16 with good peripheral pulses. She has a 1.2 x 1 diabetic foot ulcer on the plantar surface of the right great toe, this is Wagner 3.  A diagnosis of osteomyelitis was made by MRI.  We have not done transcutaneous PO2 measurements.  But, in view of the osteomyelitis and the long-term chronic nature of her ulcer which is greater than 1 year and her slow response to routine therapy, we believe she is an excellent candidate for HBO.     Elesa Hacker, M.D.     RA/MEDQ  D:  09/20/2012  T:  09/21/2012  Job:  [id number]

## 2012-09-22 ENCOUNTER — Encounter: Payer: Self-pay | Admitting: Internal Medicine

## 2012-09-22 ENCOUNTER — Ambulatory Visit (INDEPENDENT_AMBULATORY_CARE_PROVIDER_SITE_OTHER): Payer: Medicaid Other | Admitting: Internal Medicine

## 2012-09-22 ENCOUNTER — Encounter: Payer: Self-pay | Admitting: Infectious Diseases

## 2012-09-22 ENCOUNTER — Emergency Department (HOSPITAL_COMMUNITY): Payer: Medicaid Other

## 2012-09-22 ENCOUNTER — Encounter (HOSPITAL_COMMUNITY): Payer: Self-pay | Admitting: *Deleted

## 2012-09-22 ENCOUNTER — Observation Stay (HOSPITAL_COMMUNITY)
Admission: EM | Admit: 2012-09-22 | Discharge: 2012-09-24 | Disposition: A | Discharge status: Home / Self Care | Payer: Medicaid Other | Attending: Internal Medicine | Admitting: Internal Medicine

## 2012-09-22 VITALS — BP 117/78 | HR 83 | Temp 97.7°F | Ht 63.0 in | Wt 158.0 lb

## 2012-09-22 DIAGNOSIS — E1169 Type 2 diabetes mellitus with other specified complication: Secondary | ICD-10-CM

## 2012-09-22 DIAGNOSIS — R739 Hyperglycemia, unspecified: Secondary | ICD-10-CM

## 2012-09-22 DIAGNOSIS — F3289 Other specified depressive episodes: Secondary | ICD-10-CM

## 2012-09-22 DIAGNOSIS — I10 Essential (primary) hypertension: Secondary | ICD-10-CM | POA: Insufficient documentation

## 2012-09-22 DIAGNOSIS — M9080 Osteopathy in diseases classified elsewhere, unspecified site: Secondary | ICD-10-CM

## 2012-09-22 DIAGNOSIS — G459 Transient cerebral ischemic attack, unspecified: Secondary | ICD-10-CM

## 2012-09-22 DIAGNOSIS — M869 Osteomyelitis, unspecified: Secondary | ICD-10-CM

## 2012-09-22 DIAGNOSIS — IMO0002 Reserved for concepts with insufficient information to code with codable children: Secondary | ICD-10-CM

## 2012-09-22 DIAGNOSIS — E119 Type 2 diabetes mellitus without complications: Secondary | ICD-10-CM | POA: Insufficient documentation

## 2012-09-22 DIAGNOSIS — F32A Depression, unspecified: Secondary | ICD-10-CM | POA: Diagnosis present

## 2012-09-22 DIAGNOSIS — E1065 Type 1 diabetes mellitus with hyperglycemia: Secondary | ICD-10-CM | POA: Diagnosis present

## 2012-09-22 DIAGNOSIS — F329 Major depressive disorder, single episode, unspecified: Secondary | ICD-10-CM

## 2012-09-22 DIAGNOSIS — R42 Dizziness and giddiness: Secondary | ICD-10-CM | POA: Insufficient documentation

## 2012-09-22 DIAGNOSIS — R4182 Altered mental status, unspecified: Principal | ICD-10-CM | POA: Insufficient documentation

## 2012-09-22 DIAGNOSIS — R4789 Other speech disturbances: Secondary | ICD-10-CM | POA: Insufficient documentation

## 2012-09-22 HISTORY — DX: Cerebral infarction, unspecified: I63.9

## 2012-09-22 HISTORY — DX: Anxiety disorder, unspecified: F41.9

## 2012-09-22 HISTORY — DX: Hypothyroidism, unspecified: E03.9

## 2012-09-22 HISTORY — DX: Osteomyelitis, unspecified: E11.69

## 2012-09-22 HISTORY — DX: Migraine, unspecified, not intractable, without status migrainosus: G43.909

## 2012-09-22 HISTORY — DX: Osteomyelitis, unspecified: M86.9

## 2012-09-22 HISTORY — DX: Transient cerebral ischemic attack, unspecified: G45.9

## 2012-09-22 HISTORY — DX: Polyneuropathy, unspecified: G62.9

## 2012-09-22 HISTORY — DX: Pure hypercholesterolemia, unspecified: E78.00

## 2012-09-22 LAB — CBC WITH DIFFERENTIAL/PLATELET
Basophils Absolute: 0.1 10*3/uL (ref 0.0–0.1)
Basophils Relative: 1 % (ref 0–1)
Eosinophils Absolute: 0.7 10*3/uL (ref 0.0–0.7)
Eosinophils Relative: 7 % — ABNORMAL HIGH (ref 0–5)
HCT: 39.1 % (ref 36.0–46.0)
Hemoglobin: 13.3 g/dL (ref 12.0–15.0)
Lymphocytes Relative: 27 % (ref 12–46)
Lymphs Abs: 2.7 10*3/uL (ref 0.7–4.0)
MCH: 30.4 pg (ref 26.0–34.0)
MCHC: 34 g/dL (ref 30.0–36.0)
MCV: 89.5 fL (ref 78.0–100.0)
Monocytes Absolute: 0.9 10*3/uL (ref 0.1–1.0)
Monocytes Relative: 9 % (ref 3–12)
Neutro Abs: 5.7 10*3/uL (ref 1.7–7.7)
Neutrophils Relative %: 56 % (ref 43–77)
Platelets: 296 10*3/uL (ref 150–400)
RBC: 4.37 MIL/uL (ref 3.87–5.11)
RDW: 12.1 % (ref 11.5–15.5)
WBC: 10 10*3/uL (ref 4.0–10.5)

## 2012-09-22 LAB — URINE MICROSCOPIC-ADD ON

## 2012-09-22 LAB — URINALYSIS, ROUTINE W REFLEX MICROSCOPIC
Bilirubin Urine: NEGATIVE
Glucose, UA: >1000 mg/dL — AB
Hgb urine dipstick: NEGATIVE
Ketones, ur: NEGATIVE mg/dL
Nitrite: NEGATIVE
Protein, ur: NEGATIVE mg/dL
Specific Gravity, Urine: 1.026 (ref 1.005–1.030)
Urobilinogen, UA: 0.2 mg/dL (ref 0.0–1.0)
pH: 5 (ref 5.0–8.0)

## 2012-09-22 LAB — CK: Total CK: 51 U/L (ref 7–177)

## 2012-09-22 LAB — COMPREHENSIVE METABOLIC PANEL
ALT: 35 U/L (ref 0–35)
AST: 23 U/L (ref 0–37)
Albumin: 3 g/dL — ABNORMAL LOW (ref 3.5–5.2)
Alkaline Phosphatase: 92 U/L (ref 39–117)
BUN: 21 mg/dL (ref 6–23)
CO2: 27 mEq/L (ref 19–32)
Calcium: 8.8 mg/dL (ref 8.4–10.5)
Chloride: 95 mEq/L — ABNORMAL LOW (ref 96–112)
Creatinine, Ser: 0.74 mg/dL (ref 0.50–1.10)
GFR calc Af Amer: >90 mL/min (ref >90)
GFR calc non Af Amer: >90 mL/min (ref >90)
Glucose, Bld: 250 mg/dL — ABNORMAL HIGH (ref 70–99)
Potassium: 3.9 mEq/L (ref 3.5–5.1)
Sodium: 133 mEq/L — ABNORMAL LOW (ref 135–145)
Total Bilirubin: 0.3 mg/dL (ref 0.3–1.2)
Total Protein: 7 g/dL (ref 6.0–8.3)

## 2012-09-22 LAB — TROPONIN I: Troponin I: <0.3 ng/mL (ref <0.30)

## 2012-09-22 LAB — GLUCOSE, CAPILLARY
Glucose-Capillary: 315 mg/dL — ABNORMAL HIGH (ref 70–99)
Glucose-Capillary: 256 mg/dL — ABNORMAL HIGH (ref 70–99)

## 2012-09-22 MED ORDER — ASPIRIN 325 MG PO TABS
325.0000 mg | ORAL_TABLET | Freq: Once | ORAL | Status: AC
  Administered 2012-09-23: 325 mg via ORAL
  Filled 2012-09-22: qty 1

## 2012-09-22 MED ORDER — SODIUM CHLORIDE 0.9 % IV BOLUS (SEPSIS)
500.0000 mL | Freq: Once | INTRAVENOUS | Status: AC
  Administered 2012-09-22: 500 mL via INTRAVENOUS

## 2012-09-22 NOTE — ED Provider Notes (Signed)
History     CSN: [id number]  Arrival date & time 09/22/12  2030   First MD Initiated Contact with Patient 09/22/12 2041      Chief Complaint  Patient presents with  . Hyperglycemia  . Altered Mental Status    (Consider location/radiation/quality/duration/timing/severity/associated sxs/prior treatment) HPI Comments: Patient comes to the ER for evaluation of an episode where she became weak, dizzy and had trouble speaking. Symptoms began tonight and lasted for 10-15 minutes. Patient reports that she has a PICC line and has been administering antibiotics for a bone infection. Patient just was switched from vancomycin to daptomycin. This was started tonight. She thought maybe his teeth his symptoms tonight were because of the antibiotic. She did not have any swelling, rash, itching or trouble breathing. Patient reports that her blood sugar was 450 at the time and thinks maybe or simply the elevated blood sugar.  Patient is a 52 y.o. female presenting with altered mental status.  Altered Mental Status    Past Medical History  Diagnosis Date  . Diabetes mellitus type I   . HTN (hypertension)   . Macular degeneration, bilateral   . Thyroid disease     Past Surgical History  Procedure Laterality Date  . Cesarean section    . Refractive surgery      Family History  Problem Relation Age of Onset  . Depression Mother     History  Substance Use Topics  . Smoking status: Never Smoker   . Smokeless tobacco: Never Used  . Alcohol Use: No    OB History   Grav Para Term Preterm Abortions TAB SAB Ect Mult Living                  Review of Systems  Constitutional: Negative for fever.  Neurological: Positive for dizziness and speech difficulty.  Psychiatric/Behavioral: Positive for altered mental status.  All other systems reviewed and are negative.    Allergies  Erythromycin and Vancomycin  Home Medications   Current Outpatient Rx  Name  Route  Sig  Dispense  Refill   . ciprofloxacin (CIPRO) 500 MG tablet   Oral   Take 500 mg by mouth 2 (two) times daily.         . clonazePAM (KLONOPIN) 0.5 MG tablet   Oral   Take 1 tablet (0.5 mg total) by mouth at bedtime.   30 tablet   1   . DAPTOmycin (CUBICIN) 500 MG injection   Intravenous   Inject 425 mg into the vein daily. [facility] [phone]         . DULoxetine (CYMBALTA) 30 MG capsule      Take 3 capsule daily   90 capsule   1   . HUMALOG 100 UNIT/ML injection   Subcutaneous   Inject 40 Units into the skin 4 (four) times daily.          . insulin glargine (LANTUS) 100 UNIT/ML injection   Subcutaneous   Inject 40 Units into the skin 2 (two) times daily.          Marland Kitchen levothyroxine (SYNTHROID, LEVOTHROID) 137 MCG tablet   Oral   Take 137 mcg by mouth daily.           Marland Kitchen lisinopril-hydrochlorothiazide (PRINZIDE,ZESTORETIC) 20-25 MG per tablet   Oral   Take 1 tablet by mouth daily.           . simvastatin (ZOCOR) 20 MG tablet   Oral   Take 20  mg by mouth daily.            BP 145/75  Pulse 94  Temp(Src) 98.4 F (36.9 C) (Oral)  Resp 20  SpO2 100%  Physical Exam  Constitutional: She is oriented to person, place, and time. She appears well-developed and well-nourished. No distress.  HENT:  Head: Normocephalic and atraumatic.  Right Ear: Hearing normal.  Nose: Nose normal.  Mouth/Throat: Oropharynx is clear and moist and mucous membranes are normal.  Eyes: Conjunctivae and EOM are normal. Pupils are equal, round, and reactive to light.  Neck: Normal range of motion. Neck supple.  Cardiovascular: Normal rate, regular rhythm, S1 normal and S2 normal.  Exam reveals no gallop and no friction rub.   No murmur heard. Pulmonary/Chest: Effort normal and breath sounds normal. No respiratory distress. She exhibits no tenderness.  Abdominal: Soft. Normal appearance and bowel sounds are normal. There is no hepatosplenomegaly. There is no tenderness. There is no  rebound, no guarding, no tenderness at McBurney's point and negative Murphy's sign. No hernia.  Musculoskeletal: Normal range of motion.  Neurological: She is alert and oriented to person, place, and time. She has normal strength. No cranial nerve deficit or sensory deficit. Coordination normal. GCS eye subscore is 4. GCS verbal subscore is 5. GCS motor subscore is 6.  Skin: Skin is warm, dry and intact. No rash noted. No cyanosis.  Psychiatric: She has a normal mood and affect. Her speech is normal and behavior is normal. Thought content normal.    ED Course  Procedures (including critical care time)  Labs Reviewed  GLUCOSE, CAPILLARY - Abnormal; Notable for the following:    Glucose-Capillary 315 (*)    All other components within normal limits  CBC WITH DIFFERENTIAL - Abnormal; Notable for the following:    Eosinophils Relative 7 (*)    All other components within normal limits  COMPREHENSIVE METABOLIC PANEL - Abnormal; Notable for the following:    Sodium 133 (*)    Chloride 95 (*)    Glucose, Bld 250 (*)    Albumin 3.0 (*)    All other components within normal limits  URINALYSIS, ROUTINE W REFLEX MICROSCOPIC - Abnormal; Notable for the following:    APPearance CLOUDY (*)    Glucose, UA >1000 (*)    Leukocytes, UA MODERATE (*)    All other components within normal limits  GLUCOSE, CAPILLARY - Abnormal; Notable for the following:    Glucose-Capillary 256 (*)    All other components within normal limits  URINE MICROSCOPIC-ADD ON - Abnormal; Notable for the following:    Squamous Epithelial / LPF FEW (*)    All other components within normal limits  TROPONIN I   Ct Head Wo Contrast  09/22/2012  *RADIOLOGY REPORT*  Clinical Data: Speech disturbance.  CT HEAD WITHOUT CONTRAST  Technique:  Contiguous axial images were obtained from the base of the skull through the vertex without contrast.  Comparison: None  Findings: Ventricle size is normal.  Negative for hemorrhage.  Question  ill-defined hypodensity right occipital lobe.  This could be an area of acute infarct or an artifact.  Follow-up MRI if clinically appropriate.  Paranasal sinuses are clear.  Skull is negative.  IMPRESSION: Possible hypodensity right occipital lobe.  This could be an area of acute infarct or an artifact.  MRI if appropriate.   Original Report Authenticated By: Carl Best, M.D.      Diagnosis: 1. TIA, rule out CVA 2. Uncontrolled diabetes    MDM  Patient comes to the ER for evaluation of a 15 minute episode of inability to talk. She reports that she knew what she wanted to say, but could not get the words out. They sound convincing for brief episode of aphasia. Patient was very hyperglycemic at the time, blood sugar 453. I could not, however, indicate the hyperglycemia as a cause of these symptoms. Patient does have risk for cardiovascular disease and TIA. I did perform a CAT scan and it did show a possible area of hypodensity in the right occipital lobe. This region, however, would not explain the patient's neurologic symptoms. I suspect that this is artifact and the patient had a TIA, not CVA. I did discuss the management with Dr. Armida Sans, on call for neurology. As the patient is completely back to neurologic baseline, he agrees that the patient can be managed overnight and have MRI first thing in the morning. He recommends aspirin.  Patient's blood sugar was managed with IV fluids and insulin here in the ER. Repeat blood sugar was 250.        Orpah Greek, MD 09/22/12 [phone]

## 2012-09-22 NOTE — Assessment & Plan Note (Signed)
She is having an intolerance to vancomycin which seems consistent with red man syndrome. I do not consider this a true allergy. She is hesitant to continue so I will change her to daptomycin. She has an appointment scheduled on April 2.

## 2012-09-22 NOTE — ED Notes (Signed)
Patient reports elevated glucose levels. She took her insulin dose (lantus and humilin) around 7pm tonight. Recent change in antibiotic therapy for "bone infection"

## 2012-09-22 NOTE — ED Notes (Signed)
Patient transported to CT 

## 2012-09-22 NOTE — Progress Notes (Signed)
  Subjective:    Patient ID: Suzanne Allen, female    DOB: Dec 24, 1960, 52 y.o.   MRN: [id number]  HPI 52 yo F with hx of anxiety, DM1 (since 53 yo) and 1 yr of wound on R great toe. Has had repeated debridements of her toe.  Had amnio-derma-graft (June 2013) and was in cast (for 6 weeks) and it healed. After 1 week had cracked again and has since ulcerated and "turned into a bone infection".  MRI on 1-0-14 showed: 1. Right great toe cellulitis. No evidence of abscess. 2. Abnormal marrow edema and enhancement throughout the distal phalanx of the great toe with suspected early cortical erosion at  the tuft, suspicious for oteomyelitis. The proximal phalanx and  first metatarsal appear normal. She comes in today for a work in visit in 2 to "rash". When she gets the vancomycin infusion, she describes some facial hives down to her upper chest and itching. It resolves prior to the next infusion but does occur again. No mucous membrane involvement or other issues. She continues to take the Cipro. She is hesitant to continue with vanocmycin.      Review of Systems  Constitutional: Negative for fatigue.  Gastrointestinal: Negative for nausea and diarrhea.  Skin: Positive for rash.       Objective:   Physical Exam  Constitutional: She appears well-developed and well-nourished. No distress.  Cardiovascular: Normal rate, regular rhythm and normal heart sounds.  Exam reveals no gallop and no friction rub.   No murmur heard.         Assessment & Plan:

## 2012-09-22 NOTE — ED Notes (Signed)
[id number] Expected date:<BR> Expected time:<BR> Means of arrival:<BR> Comments:<BR> EMS/diabetic-BS&gt;450-being treated with IV antibiotics for infection-has a PICC

## 2012-09-22 NOTE — ED Notes (Signed)
Pt BIB EMS. Pt states she just started a new abx and administered it in her PICC line and began to feel confused. EMS reports that pt's blood glucose was 453 mg/dl. Pt states this was her first dose of the new abx (Daptomycin) and she gave it through her PICC line tonight at 1800. Pt states she has a bone infection and had a reaction to the Vancomycin she was taking previously. EMS states pt is a/o x 4, but is slow to respond to questions but answers appropriately. Pt has family on the way.

## 2012-09-22 NOTE — ED Notes (Signed)
MD at bedside to discuss plan of care

## 2012-09-22 NOTE — ED Notes (Signed)
CBG on arrival 315

## 2012-09-23 ENCOUNTER — Observation Stay (HOSPITAL_COMMUNITY): Payer: Medicaid Other

## 2012-09-23 ENCOUNTER — Encounter (HOSPITAL_COMMUNITY)
Admission: EM | Disposition: A | Discharge status: Home / Self Care | Payer: Self-pay | Source: Home / Self Care | Attending: Emergency Medicine

## 2012-09-23 ENCOUNTER — Encounter (HOSPITAL_COMMUNITY): Payer: Self-pay | Admitting: Internal Medicine

## 2012-09-23 DIAGNOSIS — I517 Cardiomegaly: Secondary | ICD-10-CM

## 2012-09-23 DIAGNOSIS — G459 Transient cerebral ischemic attack, unspecified: Secondary | ICD-10-CM | POA: Diagnosis present

## 2012-09-23 DIAGNOSIS — R7309 Other abnormal glucose: Secondary | ICD-10-CM

## 2012-09-23 DIAGNOSIS — I6782 Cerebral ischemia: Secondary | ICD-10-CM | POA: Insufficient documentation

## 2012-09-23 LAB — GLUCOSE, CAPILLARY
Glucose-Capillary: 181 mg/dL — ABNORMAL HIGH (ref 70–99)
Glucose-Capillary: 170 mg/dL — ABNORMAL HIGH (ref 70–99)
Glucose-Capillary: 136 mg/dL — ABNORMAL HIGH (ref 70–99)
Glucose-Capillary: 108 mg/dL — ABNORMAL HIGH (ref 70–99)

## 2012-09-23 LAB — LIPID PANEL
Cholesterol: 164 mg/dL (ref 0–200)
HDL: 27 mg/dL — ABNORMAL LOW (ref >39)
LDL Cholesterol: 99 mg/dL (ref 0–99)
Total CHOL/HDL Ratio: 6.1 RATIO
Triglycerides: 190 mg/dL — ABNORMAL HIGH (ref <150)
VLDL: 38 mg/dL (ref 0–40)

## 2012-09-23 LAB — BASIC METABOLIC PANEL
BUN: 17 mg/dL (ref 6–23)
CO2: 24 mEq/L (ref 19–32)
Calcium: 8.5 mg/dL (ref 8.4–10.5)
Chloride: 103 mEq/L (ref 96–112)
Creatinine, Ser: 0.68 mg/dL (ref 0.50–1.10)
GFR calc Af Amer: >90 mL/min (ref >90)
GFR calc non Af Amer: >90 mL/min (ref >90)
Glucose, Bld: 147 mg/dL — ABNORMAL HIGH (ref 70–99)
Potassium: 3.3 mEq/L — ABNORMAL LOW (ref 3.5–5.1)
Sodium: 139 mEq/L (ref 135–145)

## 2012-09-23 LAB — HEMOGLOBIN A1C
Hgb A1c MFr Bld: 12.4 % — ABNORMAL HIGH (ref <5.7)
Mean Plasma Glucose: 309 mg/dL — ABNORMAL HIGH (ref <117)

## 2012-09-23 LAB — CBC
HCT: 35.4 % — ABNORMAL LOW (ref 36.0–46.0)
Hemoglobin: 12.6 g/dL (ref 12.0–15.0)
MCH: 31.3 pg (ref 26.0–34.0)
MCHC: 35.6 g/dL (ref 30.0–36.0)
MCV: 88.1 fL (ref 78.0–100.0)
Platelets: 306 10*3/uL (ref 150–400)
RBC: 4.02 MIL/uL (ref 3.87–5.11)
RDW: 12.1 % (ref 11.5–15.5)
WBC: 8.4 10*3/uL (ref 4.0–10.5)

## 2012-09-23 LAB — TSH: TSH: 4.027 u[IU]/mL (ref 0.350–4.500)

## 2012-09-23 SURGERY — LEFT AND RIGHT HEART CATHETERIZATION WITH CORONARY ANGIOGRAM
Anesthesia: LOCAL

## 2012-09-23 MED ORDER — NONFORMULARY OR COMPOUNDED ITEM
425.0000 mg | Freq: Every day | Status: DC
  Administered 2012-09-23: 425 mg via INTRAVENOUS
  Filled 2012-09-23: qty 7

## 2012-09-23 MED ORDER — SIMVASTATIN 20 MG PO TABS
20.0000 mg | ORAL_TABLET | Freq: Every day | ORAL | Status: DC
  Administered 2012-09-23 – 2012-09-24 (×2): 20 mg via ORAL
  Filled 2012-09-23 (×2): qty 1

## 2012-09-23 MED ORDER — INSULIN GLARGINE 100 UNIT/ML ~~LOC~~ SOLN
40.0000 [IU] | Freq: Two times a day (BID) | SUBCUTANEOUS | Status: DC
  Administered 2012-09-23 (×2): 40 [IU] via SUBCUTANEOUS

## 2012-09-23 MED ORDER — DAPTOMYCIN 500 MG IV SOLR: 425.0000 mg | INTRAVENOUS | Status: DC

## 2012-09-23 MED ORDER — ENOXAPARIN SODIUM 40 MG/0.4ML ~~LOC~~ SOLN
40.0000 mg | SUBCUTANEOUS | Status: DC
  Administered 2012-09-23: 40 mg via SUBCUTANEOUS
  Filled 2012-09-23 (×2): qty 0.4

## 2012-09-23 MED ORDER — ALTEPLASE 2 MG IJ SOLR
2.0000 mg | Freq: Once | INTRAMUSCULAR | Status: AC
  Administered 2012-09-23: 2 mg
  Filled 2012-09-23: qty 2

## 2012-09-23 MED ORDER — SODIUM CHLORIDE 0.9 % IV SOLN
425.0000 mg | INTRAVENOUS | Status: DC
  Filled 2012-09-23: qty 8.5

## 2012-09-23 MED ORDER — NONFORMULARY OR COMPOUNDED ITEM
425.0000 mg | Freq: Every day | Status: DC
  Filled 2012-09-23: qty 7

## 2012-09-23 MED ORDER — LISINOPRIL 20 MG PO TABS
20.0000 mg | ORAL_TABLET | Freq: Every day | ORAL | Status: DC
  Administered 2012-09-23 – 2012-09-24 (×2): 20 mg via ORAL
  Filled 2012-09-23 (×2): qty 1

## 2012-09-23 MED ORDER — LEVOTHYROXINE SODIUM 137 MCG PO TABS
137.0000 ug | ORAL_TABLET | Freq: Every day | ORAL | Status: DC
  Administered 2012-09-23 – 2012-09-24 (×2): 137 ug via ORAL
  Filled 2012-09-23 (×4): qty 1

## 2012-09-23 MED ORDER — NON FORMULARY: 425.0000 mg | Status: DC

## 2012-09-23 MED ORDER — INSULIN ASPART 100 UNIT/ML ~~LOC~~ SOLN
40.0000 [IU] | Freq: Three times a day (TID) | SUBCUTANEOUS | Status: DC
  Administered 2012-09-23: 40 [IU] via SUBCUTANEOUS

## 2012-09-23 MED ORDER — LORAZEPAM 2 MG/ML IJ SOLN: 1.0000 mg | Freq: Once | INTRAMUSCULAR | Status: AC

## 2012-09-23 MED ORDER — SODIUM CHLORIDE 0.9 % IV SOLN: 425.0000 mg | INTRAVENOUS | Status: DC

## 2012-09-23 MED ORDER — LORAZEPAM 2 MG/ML IJ SOLN
INTRAMUSCULAR | Status: AC
  Administered 2012-09-23: 1 mg via INTRAVENOUS
  Filled 2012-09-23: qty 1

## 2012-09-23 MED ORDER — POTASSIUM CHLORIDE CRYS ER 20 MEQ PO TBCR
30.0000 meq | EXTENDED_RELEASE_TABLET | Freq: Once | ORAL | Status: AC
  Administered 2012-09-23: 30 meq via ORAL
  Filled 2012-09-23: qty 1

## 2012-09-23 MED ORDER — ASPIRIN 325 MG PO TABS
325.0000 mg | ORAL_TABLET | Freq: Every day | ORAL | Status: DC
  Administered 2012-09-23 – 2012-09-24 (×2): 325 mg via ORAL
  Filled 2012-09-23 (×2): qty 1

## 2012-09-23 MED ORDER — CLONAZEPAM 0.5 MG PO TABS
0.5000 mg | ORAL_TABLET | Freq: Every day | ORAL | Status: DC
  Administered 2012-09-23: 0.5 mg via ORAL
  Filled 2012-09-23: qty 1

## 2012-09-23 MED ORDER — SODIUM CHLORIDE 0.9 % IV SOLN
INTRAVENOUS | Status: AC
  Administered 2012-09-23 (×2): via INTRAVENOUS

## 2012-09-23 MED ORDER — INSULIN LISPRO 100 UNIT/ML ~~LOC~~ SOLN
40.0000 [IU] | Freq: Three times a day (TID) | SUBCUTANEOUS | Status: DC
  Filled 2012-09-23: qty 3

## 2012-09-23 MED ORDER — CIPROFLOXACIN HCL 500 MG PO TABS
500.0000 mg | ORAL_TABLET | Freq: Two times a day (BID) | ORAL | Status: DC
  Administered 2012-09-23 – 2012-09-24 (×3): 500 mg via ORAL
  Filled 2012-09-23 (×5): qty 1

## 2012-09-23 MED ORDER — DULOXETINE HCL 60 MG PO CPEP
90.0000 mg | ORAL_CAPSULE | Freq: Every day | ORAL | Status: DC
  Administered 2012-09-23 – 2012-09-24 (×2): 90 mg via ORAL
  Filled 2012-09-23 (×2): qty 1

## 2012-09-23 MED ORDER — INSULIN ASPART 100 UNIT/ML ~~LOC~~ SOLN: 0.0000 [IU] | Freq: Three times a day (TID) | SUBCUTANEOUS | Status: DC

## 2012-09-23 NOTE — H&P (Signed)
[facility] History and Physical  Suzanne Allen [id number] DOB: 08-03-1960 DOA: 09/22/2012  Referring physician: Dr.Pollina. PCP: No primary Suzanne Allen on file.  Specialists: None.  Chief Complaint: Difficulty speaking.  HPI: Suzanne Allen is a 52 y.o. 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 The whole episode lasted for around 15 minutes. Was not associated with any difficulty moving her upper extremities, denies any headache visual symptoms or difficulty swallowing. By the time patient was brought to the ER patient's symptoms have completely resolved. CT head was showing hypodensity which at this time ER physician has already discussed with neurologist and not sure if it is artifact. Patient has been admitted for further management. Patient denies any chest pain shortness of breath or any palpitations. Patient's blood sugar was found to be high and patient states that she has not missed her regular medications which includes Humalog and Lantus.  Review of Systems: As presented in the history of presenting illness nothing else significant.  Past Medical History  Diagnosis Date  . Diabetes mellitus type I   . HTN (hypertension)   . Macular degeneration, bilateral   . Thyroid disease    Past Surgical History  Procedure Laterality Date  . Cesarean section    . Refractive surgery     Social History:  reports that she has never smoked. She has never used smokeless tobacco. She reports that she does not drink alcohol or use illicit drugs. Lives at home. where does patient live-- Can do ADLs. Can patient participate in ADLs?  Allergies  Allergen Reactions  . Erythromycin   . Vancomycin     Red Man Syndrome    Family History  Problem Relation Age of Onset  . Depression Mother    and  Prior to Admission medications   Medication Sig Start Date End Date Taking? Authorizing Suzanne Allen  ciprofloxacin (CIPRO) 500 MG tablet Take 500 mg by mouth 2 (two) times daily.   Yes Historical Suzanne Harvie, MD  clonazePAM (KLONOPIN) 0.5 MG tablet Take 1 tablet (0.5 mg total) by mouth at bedtime. 09/19/12 09/19/13 Yes Suzanne Nations, MD  DAPTOmycin (CUBICIN) 500 MG injection Inject 425 mg into the vein daily. [facility] [phone]   Yes Historical Suzanne Enck, MD  DULoxetine (CYMBALTA) 30 MG capsule Take 3 capsule daily 09/19/12  Yes Suzanne Nations, MD  HUMALOG 100 UNIT/ML injection Inject 40 Units into the skin 4 (four) times daily.  11/28/11  Yes Historical Suzanne Devita, MD  insulin glargine (LANTUS) 100 UNIT/ML injection Inject 40 Units into the skin 2 (two) times daily.    Yes Historical Suzanne Cervini, MD  levothyroxine (SYNTHROID, LEVOTHROID) 137 MCG tablet Take 137 mcg by mouth daily.     Yes Historical Suzanne Fentress, MD  lisinopril-hydrochlorothiazide (PRINZIDE,ZESTORETIC) 20-25 MG per tablet Take 1 tablet by mouth daily.     Yes Historical Suzanne Labella, MD  simvastatin (ZOCOR) 20 MG tablet Take 20 mg by mouth daily.  12/03/11  Yes Historical Suzanne Salamone, MD   Physical Exam: Filed Vitals:   09/22/12 2255 09/22/12 2300 09/23/12 0000 09/23/12 0016  BP: 106/65 107/67 113/66   Pulse: 86 86 86   Temp:    98.7 F (37.1 C)  TempSrc:      Resp: 20 16 13    SpO2: 100% 100% 100%      General:  Well-developed and nourished.  Eyes: Anicteric no pallor. No nystagmus.  ENT: No facial asymmetry. No discharge from the ears eyes nose and mouth.  Neck: No mass felt.  Cardiovascular: S1-S2 heard. Regular.  Respiratory: No rhonchi or crepitations.  Abdomen: Soft nontender bowel sounds present.  Skin: No rash.  Musculoskeletal: Right great toe is dressed. No edema.  Psychiatric: Appears normal.  Neurologic: Alert and awake oriented to time place and person. Moves all extremities 5 x 5. No facial  asymmetry and tongue is midline.  Labs on Admission:  Basic Metabolic Panel:  Recent Labs Lab 09/22/12 2147  NA 133*  K 3.9  CL 95*  CO2 27  GLUCOSE 250*  BUN 21  CREATININE 0.74  CALCIUM 8.8   Liver Function Tests:  Recent Labs Lab 09/22/12 2147  AST 23  ALT 35  ALKPHOS 92  BILITOT 0.3  PROT 7.0  ALBUMIN 3.0*   No results found for this basename: LIPASE, AMYLASE,  in the last 168 hours No results found for this basename: AMMONIA,  in the last 168 hours CBC:  Recent Labs Lab 09/22/12 2147  WBC 10.0  NEUTROABS 5.7  HGB 13.3  HCT 39.1  MCV 89.5  PLT 296   Cardiac Enzymes:  Recent Labs Lab 09/22/12 1015 09/22/12 2147  CKTOTAL 51  --   TROPONINI  --  <0.30    BNP (last 3 results) No results found for this basename: PROBNP,  in the last 8760 hours CBG:  Recent Labs Lab 09/22/12 2042 09/22/12 2206  GLUCAP 315* 256*    Radiological Exams on Admission: Ct Head Wo Contrast  09/22/2012  *RADIOLOGY REPORT*  Clinical Data: Speech disturbance.  CT HEAD WITHOUT CONTRAST  Technique:  Contiguous axial images were obtained from the base of the skull through the vertex without contrast.  Comparison: None  Findings: Ventricle size is normal.  Negative for hemorrhage.  Question ill-defined hypodensity right occipital lobe.  This could be an area of acute infarct or an artifact.  Follow-up MRI if clinically appropriate.  Paranasal sinuses are clear.  Skull is negative.  IMPRESSION: Possible hypodensity right occipital lobe.  This could be an area of acute infarct or an artifact.  MRI if appropriate.   Original Report Authenticated By: Suzanne Allen, M.D.      Assessment/Plan Principal Problem:   TIA (transient ischemic attack) Active Problems:   Depression   Diabetic osteomyelitis   Diabetes type 1, uncontrolled   1. Possible TIA  - have discussed with on-call neurologist Suzanne Allen and patient at this time will be transferred to [facility] cone for further  management. Patient is in agreement for transfer. Patient has been placed on neurochecks swallow evaluation. MRI/MRA brain 2-D echo and carotid Dopplers has been ordered. Monitor shows sinus rhythm. Aspirin.  2. Uncontrolled diabetes type 1 not in DKA  - patient has received IV fluids and her sugar has improved. Continue to closely monitor CBGs.  Check hemoglobin A1c. Continue with her home regimen presently.  3. Right great toe osteomyelitis  - continue IV daptomycin and ciprofloxacin as recommended by infectious disease consultant.  4. Hypertension - continue lisinopril hold HCTZ because patient is getting hydrated. 5. Hyperlipidemia - continue present medications. 6. Depression - continue present medications.    Code Status:  full code.   Family Communication:  none.   Disposition Plan:  admit for observation.     KAKRAKANDY,ARSHAD N. [facility] Pager [phone].   If 7PM-7AM, please contact night-coverage [email or web address] Password [facility] 09/23/2012, 12:26 AM

## 2012-09-23 NOTE — ED Notes (Signed)
Patient meds received from pharmacy (pills and IV meds she brought from home) sent with [facility] to take with patient for admission

## 2012-09-23 NOTE — Progress Notes (Signed)
Inpatient Diabetes Program Recommendations  AACE/ADA: New Consensus Statement on Inpatient Glycemic Control (2013)  Target Ranges:  Prepandial:   less than 140 mg/dL      Peak postprandial:   less than 180 mg/dL (1-2 hours)      Critically ill patients:  140 - 180 mg/dL   Reason for Visit: Spoke to patient regarding home diabetes regimen.  She has had diabetes for 14 years.  She states that her last A1C was 13.3%.  At home she takes Lantus 80 units bid however she states that when she is in the hospital, she only takes Lantus 40 units bid.  She also takes Humalog 40 units tid with meals.  Sometimes she takes more if CBG's elevated. She see's Dr.  Steffanie Dunn in [place] for diabetes.  Will need follow-up with PCP.

## 2012-09-23 NOTE — ED Notes (Signed)
Admitting MD at bedside.

## 2012-09-23 NOTE — Progress Notes (Signed)
Utilization review completed.  

## 2012-09-23 NOTE — Consult Note (Addendum)
Referring Physician: Dr. Hal Hope.    Chief Complaint: transient language impairment.  HPI:                                                                                                                                         Suzanne Allen is an 52 y.o. female with a past medical history remarkable for hypertension, diabetes mellitus type 1, hypothyroidism, newly diagnosed diabetic osteomyelitis right great toe on IV antibiotics, transferred from [name] ED for further evaluation possible TIA. She indicated that around 8 pm last night she developed abrupt onset of inability to speak fluently and " some kind of confusion". She stated that she knew what she wanted to say but couldn't get it out. The episode lasted for approximately 15 minutes and there was not associated headache, vertigo, difficulty swallowing, double vision, focal weakness, imbalance, slurred speech, or vision impairment. Apparently she described some numbness of her hands to the ED physician but she denies sensory symptoms at this moment. Never had similar symptoms before. Hyperglycemic above 450 upon arrival to the ED. CT brain was reported as showing questionable ill defined hypodensity right occipital lobe that could represent an area of acute infarction.   Date last known well: 09/22/12  Time last known well: 8 pm. tPA Given: no, symptoms resolved by the time of presentation to the ED.  Past Medical History  Diagnosis Date  . Diabetes mellitus type I   . HTN (hypertension)   . Macular degeneration, bilateral   . Thyroid disease     Past Surgical History  Procedure Laterality Date  . Cesarean section    . Refractive surgery      Family History  Problem Relation Age of Onset  . Depression Mother    Social History:  reports that she has never smoked. She has never used smokeless tobacco. She reports that she does not drink alcohol or use illicit drugs.  Allergies:  Allergies  Allergen Reactions  .  Erythromycin   . Vancomycin     Red Man Syndrome    Medications:                                                                                                                           I have reviewed the patient's current medications.  ROS:  History obtained from the patient and chart review.  General ROS: negative for - chills, fatigue, fever, night sweats, weight gain or weight loss Psychological ROS: negative for - behavioral disorder, hallucinations, memory difficulties, mood swings or suicidal ideation Ophthalmic ROS: negative for - blurry vision, double vision, eye pain or loss of vision ENT ROS: negative for - epistaxis, nasal discharge, oral lesions, sore throat, tinnitus or vertigo Allergy and Immunology ROS: negative for - hives or itchy/watery eyes Hematological and Lymphatic ROS: negative for - bleeding problems, bruising or swollen lymph nodes Endocrine ROS: negative for - galactorrhea, hair pattern changes, polydipsia/polyuria or temperature intolerance Respiratory ROS: negative for - cough, hemoptysis, shortness of breath or wheezing Cardiovascular ROS: negative for - chest pain, dyspnea on exertion, edema or irregular heartbeat Gastrointestinal ROS: negative for - abdominal pain, diarrhea, hematemesis, nausea/vomiting or stool incontinence Genito-Urinary ROS: negative for - dysuria, hematuria, incontinence or urinary frequency/urgency Musculoskeletal ROS: negative for - joint swelling or muscular weakness Neurological ROS: as noted in HPI Dermatological ROS: negative for rash.    Physical exam: pleasant female in no apparent distress. Blood pressure 105/67, pulse 81, temperature 97.8 F (36.6 C), temperature source Oral, resp. rate 20, height 5\' 3"  (1.6 m), weight 71.442 kg (157 lb 8 oz), SpO2 96.00%. Head: normocephalic. Neck: supple,  no bruits, no JVD. Cardiac: no murmurs. Lungs: clear. Abdomen: soft, no tender, no mass. Extremities: no edema.    Neurologic Examination:                                                                                                      Mental Status: Alert, oriented, thought content appropriate.  Speech fluent without evidence of aphasia.  Able to follow 3 step commands without difficulty. Cranial Nerves: II: Discs flat bilaterally; Visual fields grossly normal, pupils equal, round, reactive to light and accommodation III,IV, VI: ptosis not present, extra-ocular motions intact bilaterally V,VII: smile symmetric, facial light touch sensation normal bilaterally VIII: hearing normal bilaterally IX,X: gag reflex present XI: bilateral shoulder shrug XII: midline tongue extension Motor: Right : Upper extremity   5/5    Left:     Upper extremity   5/5  Lower extremity   5/5     Lower extremity   5/5 Tone and bulk:normal tone throughout; no atrophy noted Sensory: Pinprick and light touch intact throughout, bilaterally Deep Tendon Reflexes: 2+ and symmetric throughout Plantars: Right: downgoing   Left: downgoing Cerebellar: normal finger-to-nose,  normal heel-to-shin test Gait: no ataxia. CV: pulses palpable throughout       Results for orders placed during the hospital encounter of 09/22/12 (from the past 48 hour(s))  GLUCOSE, CAPILLARY     Status: Abnormal   Collection Time    09/22/12  8:42 PM      Result Value Range   Glucose-Capillary 315 (*) 70 - 99 mg/dL  CBC WITH DIFFERENTIAL     Status: Abnormal   Collection Time    09/22/12  9:47 PM      Result Value Range   WBC 10.0  4.0 - 10.5 K/uL  RBC 4.37  3.87 - 5.11 MIL/uL   Hemoglobin 13.3  12.0 - 15.0 g/dL   HCT 39.1  36.0 - 46.0 %   MCV 89.5  78.0 - 100.0 fL   MCH 30.4  26.0 - 34.0 pg   MCHC 34.0  30.0 - 36.0 g/dL   RDW 12.1  11.5 - 15.5 %   Platelets 296  150 - 400 K/uL   Neutrophils Relative 56  43 - 77 %    Neutro Abs 5.7  1.7 - 7.7 K/uL   Lymphocytes Relative 27  12 - 46 %   Lymphs Abs 2.7  0.7 - 4.0 K/uL   Monocytes Relative 9  3 - 12 %   Monocytes Absolute 0.9  0.1 - 1.0 K/uL   Eosinophils Relative 7 (*) 0 - 5 %   Eosinophils Absolute 0.7  0.0 - 0.7 K/uL   Basophils Relative 1  0 - 1 %   Basophils Absolute 0.1  0.0 - 0.1 K/uL  COMPREHENSIVE METABOLIC PANEL     Status: Abnormal   Collection Time    09/22/12  9:47 PM      Result Value Range   Sodium 133 (*) 135 - 145 mEq/L   Potassium 3.9  3.5 - 5.1 mEq/L   Chloride 95 (*) 96 - 112 mEq/L   CO2 27  19 - 32 mEq/L   Glucose, Bld 250 (*) 70 - 99 mg/dL   BUN 21  6 - 23 mg/dL   Creatinine, Ser 0.74  0.50 - 1.10 mg/dL   Calcium 8.8  8.4 - 10.5 mg/dL   Total Protein 7.0  6.0 - 8.3 g/dL   Albumin 3.0 (*) 3.5 - 5.2 g/dL   AST 23  0 - 37 U/L   ALT 35  0 - 35 U/L   Alkaline Phosphatase 92  39 - 117 U/L   Total Bilirubin 0.3  0.3 - 1.2 mg/dL   GFR calc non Af Amer >90  >90 mL/min   GFR calc Af Amer >90  >90 mL/min   Comment:            The eGFR has been calculated     using the CKD EPI equation.     This calculation has not been     validated in all clinical     situations.     eGFR's persistently     <90 mL/min signify     possible Chronic Kidney Disease.  TROPONIN I     Status: None   Collection Time    09/22/12  9:47 PM      Result Value Range   Troponin I <0.30  <0.30 ng/mL   Comment:            Due to the release kinetics of cTnI,     a negative result within the first hours     of the onset of symptoms does not rule out     myocardial infarction with certainty.     If myocardial infarction is still suspected,     repeat the test at appropriate intervals.  GLUCOSE, CAPILLARY     Status: Abnormal   Collection Time    09/22/12 10:06 PM      Result Value Range   Glucose-Capillary 256 (*) 70 - 99 mg/dL  URINALYSIS, ROUTINE W REFLEX MICROSCOPIC     Status: Abnormal   Collection Time    09/22/12 10:37 PM      Result Value  Range  Color, Urine YELLOW  YELLOW   APPearance CLOUDY (*) CLEAR   Specific Gravity, Urine 1.026  1.005 - 1.030   pH 5.0  5.0 - 8.0   Glucose, UA >1000 (*) NEGATIVE mg/dL   Hgb urine dipstick NEGATIVE  NEGATIVE   Bilirubin Urine NEGATIVE  NEGATIVE   Ketones, ur NEGATIVE  NEGATIVE mg/dL   Protein, ur NEGATIVE  NEGATIVE mg/dL   Urobilinogen, UA 0.2  0.0 - 1.0 mg/dL   Nitrite NEGATIVE  NEGATIVE   Leukocytes, UA MODERATE (*) NEGATIVE  URINE MICROSCOPIC-ADD ON     Status: Abnormal   Collection Time    09/22/12 10:37 PM      Result Value Range   Squamous Epithelial / LPF FEW (*) RARE   WBC, UA 11-20  <3 WBC/hpf  GLUCOSE, CAPILLARY     Status: Abnormal   Collection Time    09/23/12  1:08 AM      Result Value Range   Glucose-Capillary 181 (*) 70 - 99 mg/dL  GLUCOSE, CAPILLARY     Status: Abnormal   Collection Time    09/23/12  3:02 AM      Result Value Range   Glucose-Capillary 170 (*) 70 - 99 mg/dL  CBC     Status: Abnormal   Collection Time    09/23/12  5:10 AM      Result Value Range   WBC 8.4  4.0 - 10.5 K/uL   RBC 4.02  3.87 - 5.11 MIL/uL   Hemoglobin 12.6  12.0 - 15.0 g/dL   HCT 35.4 (*) 36.0 - 46.0 %   MCV 88.1  78.0 - 100.0 fL   MCH 31.3  26.0 - 34.0 pg   MCHC 35.6  30.0 - 36.0 g/dL   RDW 12.1  11.5 - 15.5 %   Platelets 306  150 - 400 K/uL   Ct Head Wo Contrast  09/22/2012  *RADIOLOGY REPORT*  Clinical Data: Speech disturbance.  CT HEAD WITHOUT CONTRAST  Technique:  Contiguous axial images were obtained from the base of the skull through the vertex without contrast.  Comparison: None  Findings: Ventricle size is normal.  Negative for hemorrhage.  Question ill-defined hypodensity right occipital lobe.  This could be an area of acute infarct or an artifact.  Follow-up MRI if clinically appropriate.  Paranasal sinuses are clear.  Skull is negative.  IMPRESSION: Possible hypodensity right occipital lobe.  This could be an area of acute infarct or an artifact.  MRI if  appropriate.   Original Report Authenticated By: Carl Best, M.D.        Assessment: 52 y.o. female with transient neurological dysfunction characterized by language impairment and confusion lasting for 15 minutes. Of note, serum blood sugar above 450 upon initial ED assessment and therefore can not exclude a primary metabolic derangement as the cause for above mentioned symptoms. Certainly, she has risk factors for stroke and thus TIA is in the differential diagnosis. Ictal dysphasia seems to be less likely. CT findings do not explain patient's symptoms.  Stroke Risk Factors - hypertension, diabetes.  Plan: 1. HgbA1c, fasting lipid panel 2. MRI, MRA  of the brain without contrast 3. PT consult, OT consult, Speech consult 4. Echocardiogram 5. Carotid dopplers 6. Prophylactic therapy- aspirin 81 mg daily.  7. Risk factor modification 8. Telemetry monitoring 9. Frequent neuro checks   Dorian Pod, MD [facility] [phone]  09/23/2012, 6:12 AM

## 2012-09-23 NOTE — Progress Notes (Signed)
Pt is claustrophobic and refused MRI exam. She was asked if she would try to continue exam if she received some sedation. Ms Suzanne Allen tried with medication on board and still refused to continue.

## 2012-09-23 NOTE — Progress Notes (Signed)
VASCULAR LAB PRELIMINARY  PRELIMINARY  PRELIMINARY  PRELIMINARY  Carotid duplex completed.    Preliminary report:  Bilateral:  No evidence of hemodynamically significant internal carotid artery stenosis.   Vertebral artery flow is antegrade.     SLAUGHTER, [place], RVS 09/23/2012, 10:24 AM

## 2012-09-23 NOTE — Progress Notes (Signed)
  Echocardiogram 2D Echocardiogram has been performed.  Diamond Nickel 09/23/2012, 9:58 AM

## 2012-09-23 NOTE — Progress Notes (Signed)
[facility] PROGRESS NOTE  [facility] [id number] DOB: 16-Jun-1961 DOA: 09/22/2012 PCP: No primary [facility] on file.  Brief Narrative: 52 y.o. 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 The whole episode lasted for around 15 minutes. Was not associated with any difficulty moving her upper extremities, denies any headache visual symptoms or difficulty swallowing. By the time patient was brought to the ER patient's symptoms have completely resolved. CT head was showing hypodensity which at this time ER physician has already discussed with neurologist and not sure if it is artifact. Patient has been admitted for further management. Patient denies any chest pain shortness of breath or any palpitations. Patient's blood sugar was found to be high and patient states that she has not missed her regular medications which includes Humalog and Lantus.  Assessment/Plan: 1. Possible TIA - Neuro following. 2D echo today. Plan for TEE, but patient wishes that that is to be done as an outpatient. I've contacted cardiology [facility]) and he will call patient on the morning to set up an appointment. I recommended that the patient has followup with her PCP early next week. 2. Uncontrolled diabetes type 1 not in DKA - patient has received IV fluids and her sugar has improved. Continue to closely monitor CBGs.  3. Right great toe osteomyelitis - continue IV daptomycin and ciprofloxacin as recommended by infectious disease consultant.  4. Hypertension - continue lisinopril hold HCTZ because patient is getting hydrated. 5. Hyperlipidemia - continue present medications. 6. Depression - continue present medications.  Code Status: Full code Family Communication: None  Disposition Plan: Home tomorrow  morning  Consultants:  Neurology  Procedures: 2D echo Antibiotics: Cipro/Dapto HPI/Subjective: no complaints today, she is feeling well, her symptoms resolved.    Objective: Filed Vitals:   09/23/12 0054 09/23/12 0140 09/23/12 0400 09/23/12 1400  BP: 122/70 93/58 105/67 98/67  Pulse: 79 83 81 89  Temp:  98.4 F (36.9 C) 97.8 F (36.6 C) 97.9 F (36.6 C)  TempSrc:  Oral Oral Oral  Resp: 16 20 20 18   Height:  5\' 3"  (1.6 m)    Weight:  71.442 kg (157 lb 8 oz)    SpO2: 99% 98% 96% 99%    Intake/Output Summary (Last 24 hours) at 09/23/12 1703 Last data filed at 09/23/12 1300  Gross per 24 hour  Intake 1702.5 ml  Output    400 ml  Net 1302.5 ml   Filed Weights   09/23/12 0140  Weight: 71.442 kg (157 lb 8 oz)    Exam:   General:  NAD  Cardiovascular: regular rate and rhythm, without MRG  Respiratory: good air movement, clear to auscultation throughout, no wheezing, ronchi or rales  Abdomen: soft, not tender to palpation, positive bowel sounds  MSK: no peripheral edema  Neuro: CN 2-12 grossly intact, MS 5/5 in all 4  Data Reviewed: Basic Metabolic Panel:  Recent Labs Lab 09/22/12 2147 09/23/12 0510  NA 133* 139  K 3.9 3.3*  CL 95* 103  CO2 27 24  GLUCOSE 250* 147*  BUN 21 17  CREATININE 0.74 0.68  CALCIUM 8.8 8.5   Liver Function Tests:  Recent Labs Lab 09/22/12 2147  AST 23  ALT 35  ALKPHOS 92  BILITOT 0.3  PROT 7.0  ALBUMIN 3.0*   CBC:  Recent Labs Lab 09/22/12 2147 09/23/12 0510  WBC 10.0 8.4  NEUTROABS 5.7  --   HGB 13.3 12.6  HCT 39.1 35.4*  MCV 89.5 88.1  PLT 296 306   Cardiac Enzymes:  Recent Labs Lab 09/22/12 1015 09/22/12 2147  CKTOTAL 51  --   TROPONINI  --  <0.30   CBG:  Recent Labs Lab 09/22/12 2042 09/22/12 2206 09/23/12 0108 09/23/12 0302 09/23/12 1024  GLUCAP 315* 256* 181* 170* 136*    No results found for this or any previous visit (from the past 240 hour(s)).   Studies: Ct Head Wo  Contrast  09/22/2012  *RADIOLOGY REPORT*  Clinical Data: Speech disturbance.  CT HEAD WITHOUT CONTRAST  Technique:  Contiguous axial images were obtained from the base of the skull through the vertex without contrast.  Comparison: None  Findings: Ventricle size is normal.  Negative for hemorrhage.  Question ill-defined hypodensity right occipital lobe.  This could be an area of acute infarct or an artifact.  Follow-up MRI if clinically appropriate.  Paranasal sinuses are clear.  Skull is negative.  IMPRESSION: Possible hypodensity right occipital lobe.  This could be an area of acute infarct or an artifact.  MRI if appropriate.   Original Report Authenticated By: Carl Best, M.D.     Scheduled Meds: . aspirin  325 mg Oral Daily  . ciprofloxacin  500 mg Oral BID  . clonazePAM  0.5 mg Oral QHS  . [START ON 09/30/2012] DAPTOmycin (CUBICIN)  IV  425 mg Intravenous Q24H  . DULoxetine  90 mg Oral Daily  . enoxaparin (LOVENOX) injection  40 mg Subcutaneous Q24H  . Home Supply- Daptomycin 425mg /84ml Syringe  425 mg Intravenous Q supper  . insulin aspart  0-9 Units Subcutaneous TID WC  . insulin aspart  40 Units Subcutaneous TID WC  . insulin glargine  40 Units Subcutaneous BID  . levothyroxine  137 mcg Oral QAC breakfast  . lisinopril  20 mg Oral Daily  . simvastatin  20 mg Oral Daily   Continuous Infusions: . sodium chloride 75 mL/hr at 09/23/12 0258    Principal Problem:   TIA (transient ischemic attack) Active Problems:   Depression   Diabetic osteomyelitis   Diabetes type 1, uncontrolled  Marzetta Board, MD [facility] Pager [phone]. If 7 PM - 7 AM, please contact night-coverage at [email or web address], password [facility] 09/23/2012, 5:03 PM  LOS: 1 day

## 2012-09-23 NOTE — Progress Notes (Signed)
Stroke Team Progress Note  HISTORY Suzanne Allen is an 52 y.o. female with a past medical history remarkable for hypertension, diabetes mellitus type 1, hypothyroidism, newly diagnosed diabetic osteomyelitis right great toe on IV antibiotics, transferred from [name] ED for further evaluation possible TIA.   She indicated that around 8 pm last night 09/22/2012 she developed abrupt onset of inability to speak fluently and " some kind of confusion". She stated that she knew what she wanted to say but couldn't get it out. The episode lasted for approximately 15 minutes and there was not associated headache, vertigo, difficulty swallowing, double vision, focal weakness, imbalance, slurred speech, or vision impairment. Apparently she described some numbness of her hands to the ED physician but she denies sensory symptoms at this moment. Never had similar symptoms before. Hyperglycemic above 450 upon arrival to the ED. CT brain was reported as showing questionable ill defined hypodensity right occipital lobe that could represent an area of acute infarction.   Patient was not a TPA candidate secondary to symptom resolution, delay in arrival. She was admitted  for further evaluation and treatment.  SUBJECTIVE Her husband is at the bedside.  Overall she feels her condition is stable. She would like to go home.  OBJECTIVE Most recent Vital Signs: Filed Vitals:   09/23/12 0016 09/23/12 0054 09/23/12 0140 09/23/12 0400  BP:  122/70 93/58 105/67  Pulse:  79 83 81  Temp: 98.7 F (37.1 C)  98.4 F (36.9 C) 97.8 F (36.6 C)  TempSrc:   Oral Oral  Resp:  16 20 20   Height:   5\' 3"  (1.6 m)   Weight:   71.442 kg (157 lb 8 oz)   SpO2:  99% 98% 96%   CBG (last 3)   Recent Labs  09/23/12 0108 09/23/12 0302 09/23/12 1024  GLUCAP 181* 170* 136*    IV Fluid Intake:   . sodium chloride 75 mL/hr at 09/23/12 0258    MEDICATIONS  . aspirin  325 mg Oral Daily  . ciprofloxacin  500 mg Oral BID  .  clonazePAM  0.5 mg Oral QHS  . DAPTOmycin (CUBICIN)  IV  425 mg Intravenous Q24H  . DULoxetine  90 mg Oral Daily  . enoxaparin (LOVENOX) injection  40 mg Subcutaneous Q24H  . insulin aspart  0-9 Units Subcutaneous TID WC  . insulin glargine  40 Units Subcutaneous BID  . insulin lispro  40 Units Subcutaneous TID AC & HS  . levothyroxine  137 mcg Oral QAC breakfast  . lisinopril  20 mg Oral Daily  . simvastatin  20 mg Oral Daily   PRN:    Diet:  Cardiac thin liquids Activity:   Bathroom privileges with assistance DVT Prophylaxis:  Lovenox 40 mg sq daily   CLINICALLY SIGNIFICANT STUDIES Basic Metabolic Panel:  Recent Labs Lab 09/22/12 2147 09/23/12 0510  NA 133* 139  K 3.9 3.3*  CL 95* 103  CO2 27 24  GLUCOSE 250* 147*  BUN 21 17  CREATININE 0.74 0.68  CALCIUM 8.8 8.5   Liver Function Tests:  Recent Labs Lab 09/22/12 2147  AST 23  ALT 35  ALKPHOS 92  BILITOT 0.3  PROT 7.0  ALBUMIN 3.0*   CBC:  Recent Labs Lab 09/22/12 2147 09/23/12 0510  WBC 10.0 8.4  NEUTROABS 5.7  --   HGB 13.3 12.6  HCT 39.1 35.4*  MCV 89.5 88.1  PLT 296 306   Coagulation: No results found for this basename: LABPROT, INR,  in  the last 168 hours Cardiac Enzymes:  Recent Labs Lab 09/22/12 1015 09/22/12 2147  CKTOTAL 51  --   TROPONINI  --  <0.30   Urinalysis:  Recent Labs Lab 09/22/12 2237  COLORURINE YELLOW  LABSPEC 1.026  PHURINE 5.0  GLUCOSEU >1000*  HGBUR NEGATIVE  BILIRUBINUR NEGATIVE  KETONESUR NEGATIVE  PROTEINUR NEGATIVE  UROBILINOGEN 0.2  NITRITE NEGATIVE  LEUKOCYTESUR MODERATE*   Lipid Panel    Component Value Date/Time   CHOL 164 09/23/2012 0510   TRIG 190* 09/23/2012 0510   HDL 27* 09/23/2012 0510   CHOLHDL 6.1 09/23/2012 0510   VLDL 38 09/23/2012 0510   LDLCALC 99 09/23/2012 0510   HgbA1C  Lab Results  Component Value Date   HGBA1C 11.2* 02/20/2011    Urine Drug Screen:     Component Value Date/Time   LABOPIA POSITIVE* 09/17/2008 0358   COCAINSCRNUR  NONE DETECTED 09/17/2008 0358   LABBENZ NONE DETECTED 09/17/2008 0358   AMPHETMU NONE DETECTED 09/17/2008 0358   THCU NONE DETECTED 09/17/2008 0358   LABBARB  Value: NONE DETECTED        DRUG SCREEN FOR MEDICAL PURPOSES ONLY.  IF CONFIRMATION IS NEEDED FOR ANY PURPOSE, NOTIFY LAB WITHIN 5 DAYS.        LOWEST DETECTABLE LIMITS FOR URINE DRUG SCREEN Drug Class       Cutoff (ng/mL) Amphetamine      1000 Barbiturate      200 Benzodiazepine   [id number] Tricyclics       [id number] Opiates          300 Cocaine          300 THC              50 09/17/2008 0358    Alcohol Level: No results found for this basename: ETH,  in the last 168 hours   CT of the brain  09/22/2012 Possible hypodensity right occipital lobe.  This could be an area of acute infarct or an artifact.  MRI if appropriate  MRI/A of the brain  Patient could not tolerate  2D Echocardiogram    Carotid Doppler  No evidence of hemodynamically significant internal carotid artery stenosis. Vertebral artery flow is antegrade.   CXR    EKG  normal sinus rhythm.   Therapy Recommendations no therapy needs  Physical Exam   Pleasant middle aged lady not in distress.Awake alert. Afebrile. Head is nontraumatic. Neck is supple without bruit. Hearing is normal. Cardiac exam no murmur or gallop. Lungs are clear to auscultation. Distal pulses are well felt. Neurological Exam : Awake  Alert oriented x 3. Normal speech and language.eye movements full without nystagmus.fundi were not visualized. Vision acuity and fields appear normal.. Face symmetric. Tongue midline. Normal strength, tone, reflexes and coordination. Normal sensation. Gait deferred. ASSESSMENT Ms. Suzanne Allen is a 52 y.o. female presenting with 15 min episode of expressive aphasia. Suspect a left brain event, unable to tolerate MRI, likely TIA. TIA felt to be embolic secondary to unknown etiology, ? afib. On no antiplatelets prior to admission. Now on aspirin 325 mg orally every day for secondary stroke  prevention. Patient with no resultant neuro symtpoms. Work up underway.  Hypertension Hyperlipidemia, LDL 99, on statin PTA, on statin now, at goal LDL < 100 Diabetes, HgbA1c pending Reports hx chest flutter, ? afib  Hospital day # 1  TREATMENT/PLAN  Continue aspirin 325 mg orally every day for secondary stroke prevention.  F/u 2D TEE to look for embolic source. Please arrange  with pts cardiologist or cardiologist of choice. Will need to be NPO after midnight. If positive for PFO (patent foramen ovale), check bilateral lower extremity venous dopplers to rule out DVT as possible source of stroke. TEEs are not performed over the weekend. Ok to do as an OP next week is desired or keep inpatient for TEE Monday. Please schedule outpatient telemetry monitoring to assess patient for atrial fibrillation as source of stroke. May be arranged with patient's cardiologist, or cardiologist of choice.  Patient has a 10-15% risk of having another stroke over the next year, the highest risk is within 2 weeks of the most recent stroke/TIA (risk of having a stroke following a stroke or TIA is the same). Ongoing risk factor control by Primary Care Physician No therapy needs Follow up with Dr. Leonie Man, [facility], in 2 months.   Burnetta Sabin, MSN, RN, ANVP-BC, ANP-BC, [facility] Pager: [phone] 09/23/2012 12:49 PM  I have personally obtained a history, examined the patient, evaluated imaging results, and formulated the assessment and plan of care. I agree with the above. Antony Contras, MD

## 2012-09-24 LAB — GLUCOSE, CAPILLARY
Glucose-Capillary: 305 mg/dL — ABNORMAL HIGH (ref 70–99)
Glucose-Capillary: 87 mg/dL (ref 70–99)

## 2012-09-24 LAB — BASIC METABOLIC PANEL
BUN: 16 mg/dL (ref 6–23)
CO2: 28 mEq/L (ref 19–32)
Calcium: 8.2 mg/dL — ABNORMAL LOW (ref 8.4–10.5)
Chloride: 105 mEq/L (ref 96–112)
Creatinine, Ser: 0.66 mg/dL (ref 0.50–1.10)
GFR calc Af Amer: >90 mL/min (ref >90)
GFR calc non Af Amer: >90 mL/min (ref >90)
Glucose, Bld: 141 mg/dL — ABNORMAL HIGH (ref 70–99)
Potassium: 3.6 mEq/L (ref 3.5–5.1)
Sodium: 141 mEq/L (ref 135–145)

## 2012-09-24 MED ORDER — ASPIRIN 325 MG PO TABS: 325.0000 mg | ORAL_TABLET | Freq: Every day | ORAL | Status: DC

## 2012-09-24 MED ORDER — HEPARIN SOD (PORK) LOCK FLUSH 100 UNIT/ML IV SOLN
250.0000 [IU] | INTRAVENOUS | Status: AC | PRN
  Administered 2012-09-24: 250 [IU]

## 2012-09-24 MED ORDER — SODIUM CHLORIDE 0.9 % IJ SOLN
10.0000 mL | INTRAMUSCULAR | Status: DC | PRN
  Administered 2012-09-24: 10 mL

## 2012-09-24 NOTE — Progress Notes (Signed)
Stroke Team Progress Note  HISTORY Suzanne Allen is an 52 y.o. female with a past medical history remarkable for hypertension, diabetes mellitus type 1, hypothyroidism, newly diagnosed diabetic osteomyelitis right great toe on IV antibiotics, transferred from [facility] for further evaluation possible TIA.   She indicated that around 8 pm last night 09/22/2012 she developed abrupt onset of inability to speak fluently and " some kind of confusion". She stated that she knew what she wanted to say but couldn't get it out. The episode lasted for approximately 15 minutes and there was not associated headache, vertigo, difficulty swallowing, double vision, focal weakness, imbalance, slurred speech, or vision impairment. Apparently she described some numbness of her hands to the ED physician but she denies sensory symptoms at this moment. Never had similar symptoms before. Hyperglycemic above 450 upon arrival to the ED. CT brain was reported as showing questionable ill defined hypodensity right occipital lobe that could represent an area of acute infarction.   Patient was not a TPA candidate secondary to symptom resolution, delay in arrival. She was admitted  for further evaluation and treatment.  SUBJECTIVE  Overall she feels her condition is stable. She would like to go home.  OBJECTIVE Most recent Vital Signs: Filed Vitals:   09/23/12 2000 09/24/12 0000 09/24/12 0400 09/24/12 0800  BP: 112/66 91/46 104/69 110/70  Pulse: 86 87 83 79  Temp: 97.5 F (36.4 C)  98.5 F (36.9 C) 98.3 F (36.8 C)  TempSrc: Oral  Oral Oral  Resp:   18 20  Height:      Weight:   72.485 kg (159 lb 12.8 oz)   SpO2: 100% 98% 98% 98%   CBG (last 3)   Recent Labs  09/23/12 1650 09/23/12 2205 09/24/12 0742  GLUCAP 108* 305* 87    IV Fluid Intake:      MEDICATIONS  . aspirin  325 mg Oral Daily  . ciprofloxacin  500 mg Oral BID  . clonazePAM  0.5 mg Oral QHS  . [START ON 09/30/2012] DAPTOmycin (CUBICIN)  IV   425 mg Intravenous Q24H  . DULoxetine  90 mg Oral Daily  . enoxaparin (LOVENOX) injection  40 mg Subcutaneous Q24H  . Home Supply- Daptomycin 425mg /16ml Syringe  425 mg Intravenous Q supper  . insulin aspart  0-9 Units Subcutaneous TID WC  . insulin aspart  40 Units Subcutaneous TID WC  . insulin glargine  40 Units Subcutaneous BID  . levothyroxine  137 mcg Oral QAC breakfast  . lisinopril  20 mg Oral Daily  . simvastatin  20 mg Oral Daily   PRN:    Diet:  Cardiac thin liquids Activity:   Bathroom privileges with assistance DVT Prophylaxis:  Lovenox 40 mg sq daily   CLINICALLY SIGNIFICANT STUDIES Basic Metabolic Panel:   Recent Labs Lab 09/23/12 0510 09/24/12 0542  NA 139 141  K 3.3* 3.6  CL 103 105  CO2 24 28  GLUCOSE 147* 141*  BUN 17 16  CREATININE 0.68 0.66  CALCIUM 8.5 8.2*   Liver Function Tests:   Recent Labs Lab 09/22/12 2147  AST 23  ALT 35  ALKPHOS 92  BILITOT 0.3  PROT 7.0  ALBUMIN 3.0*   CBC:   Recent Labs Lab 09/22/12 2147 09/23/12 0510  WBC 10.0 8.4  NEUTROABS 5.7  --   HGB 13.3 12.6  HCT 39.1 35.4*  MCV 89.5 88.1  PLT 296 306   Coagulation: No results found for this basename: LABPROT, INR,  in  the last 168 hours Cardiac Enzymes:   Recent Labs Lab 09/22/12 1015 09/22/12 2147  CKTOTAL 51  --   TROPONINI  --  <0.30   Urinalysis:   Recent Labs Lab 09/22/12 2237  COLORURINE YELLOW  LABSPEC 1.026  PHURINE 5.0  GLUCOSEU >1000*  HGBUR NEGATIVE  BILIRUBINUR NEGATIVE  KETONESUR NEGATIVE  PROTEINUR NEGATIVE  UROBILINOGEN 0.2  NITRITE NEGATIVE  LEUKOCYTESUR MODERATE*   Lipid Panel    Component Value Date/Time   CHOL 164 09/23/2012 0510   TRIG 190* 09/23/2012 0510   HDL 27* 09/23/2012 0510   CHOLHDL 6.1 09/23/2012 0510   VLDL 38 09/23/2012 0510   LDLCALC 99 09/23/2012 0510   HgbA1C  Lab Results  Component Value Date   HGBA1C 12.4* 09/23/2012    Urine Drug Screen:     Component Value Date/Time   LABOPIA POSITIVE* 09/17/2008  0358   COCAINSCRNUR NONE DETECTED 09/17/2008 0358   LABBENZ NONE DETECTED 09/17/2008 0358   AMPHETMU NONE DETECTED 09/17/2008 0358   THCU NONE DETECTED 09/17/2008 0358   LABBARB  Value: NONE DETECTED        DRUG SCREEN FOR MEDICAL PURPOSES ONLY.  IF CONFIRMATION IS NEEDED FOR ANY PURPOSE, NOTIFY LAB WITHIN 5 DAYS.        LOWEST DETECTABLE LIMITS FOR URINE DRUG SCREEN Drug Class       Cutoff (ng/mL) Amphetamine      1000 Barbiturate      200 Benzodiazepine   [id number] Tricyclics       [id number] Opiates          300 Cocaine          300 THC              50 09/17/2008 0358    Alcohol Level: No results found for this basename: ETH,  in the last 168 hours   CT of the brain  09/22/2012 Possible hypodensity right occipital lobe.  This could be an area of acute infarct or an artifact.  MRI if appropriate  MRI/A of the brain  Patient could not tolerate  2D Echocardiogram   Study Conclusions  - Left ventricle: The cavity size was normal. Wall thickness was increased in a pattern of mild LVH. The estimated ejection fraction was 65%. Wall motion was normal; there were no regional wall motion abnormalities. - Impressions: No cardiac source of embolism was identified, but cannot be ruled out on the basis of this examination. Impressions:  - No cardiac source of embolism was identified, but cannot be ruled out on the basis of this examination.    Carotid Doppler  No evidence of hemodynamically significant internal carotid artery stenosis. Vertebral artery flow is antegrade.   CXR    EKG  normal sinus rhythm.   Therapy Recommendations no therapy needs  Physical Exam    The patient is alert and cooperative.  Neurologic exam reveals full extraocular movements, speech is normal. Visual fields are full.  Motor testing reveals good strength of all four extremities.  The patient has good finger-nose-finger and heel-to-shin bilaterally. Gait was not tested.  Deep tendon reflexes are symmetric and normal. Toes are  down going bilaterally.   ASSESSMENT Suzanne Allen is a 52 y.o. female presenting with 15 min episode of expressive aphasia. Suspect a left brain event, unable to tolerate MRI, likely TIA. TIA felt to be embolic secondary to unknown etiology, ? afib. On no antiplatelets prior to admission. Now on aspirin 325 mg orally every day for secondary  stroke prevention. Patient with no resultant neuro symtpoms. Work up underway.  Hypertension Hyperlipidemia, LDL 99, on statin PTA, on statin now, at goal LDL < 100 Diabetes, HgbA1c pending Reports hx chest flutter, ? afib  Hospital day # 2  TREATMENT/PLAN  Continue aspirin 325 mg orally every day for secondary stroke prevention. TEE to look for embolic source. Please arrange with pts cardiologist or cardiologist of choice. Will need to be NPO after midnight. If positive for PFO (patent foramen ovale), check bilateral lower extremity venous dopplers to rule out DVT as possible source of stroke. TEEs are not performed over the weekend. Ok to do as an OP next week is desired or keep inpatient for TEE Monday. Please schedule outpatient telemetry monitoring to assess patient for atrial fibrillation as source of stroke. May be arranged with patient's cardiologist, or cardiologist of choice.  Patient has a 10-15% risk of having another stroke over the next year, the highest risk is within 2 weeks of the most recent stroke/TIA (risk of having a stroke following a stroke or TIA is the same). Ongoing risk factor control by Primary Care Physician No therapy needs Follow up with Dr. Leonie Man, [facility], in 2 months. The patient is agreeable to get the MRI of the brain as an outpatient at [facility] with some sedation. She can call our office to schedule the study. Possible discharge to home today, further outpatient work up   09/24/2012 10:17 AM  I have personally obtained a history, examined the patient, evaluated imaging results, and formulated the  assessment and plan of care. I agree with the above. Lenor Coffin

## 2012-09-24 NOTE — Progress Notes (Signed)
UR COMPLETED  

## 2012-09-24 NOTE — Discharge Summary (Signed)
Physician Discharge Summary  Taytem Papworth [id number] DOB: 1960-12-19 DOA: 09/22/2012  PCP: No primary provider on file.  Admit date: 09/22/2012 Discharge date: 09/24/2012  Time spent: 40 minutes  Recommendations for Outpatient Follow-up:  1. Please followup with cardiology for a TEE.  2. Please followup with infectious disease as scheduled.  Discharge Diagnoses:  Principal Problem:   TIA (transient ischemic attack) Active Problems:   Depression   Diabetic osteomyelitis   Diabetes type 1, uncontrolled  Discharge Condition: Stable  Diet recommendation: Heart healthy  Filed Weights   09/23/12 0140 09/24/12 0400  Weight: 71.442 kg (157 lb 8 oz) 72.485 kg (159 lb 12.8 oz)    History of present illness:  Georgina Ormonde is a 52 y.o. 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 The whole episode lasted for around 15 minutes. Was not associated with any difficulty moving her upper extremities, denies any headache visual symptoms or difficulty swallowing. By the time patient was brought to the ER patient's symptoms have completely resolved. CT head was showing hypodensity which at this time ER physician has already discussed with neurologist and not sure if it is artifact. Patient has been admitted for further management. Patient denies any chest pain shortness of breath or any palpitations. Patient's blood sugar was found to be high and patient states that she has not missed her regular medications which includes Humalog and Lantus.  Hospital Course:  1. Possible TIA - Neurology has been consulted while patient was in the hospital. 2D echo did not show any evidence of a cardiac source of embolism. Neurology recommended a TEE, but patient preferences are that that is to be done as an outpatient. I've  contacted cardiology [facility]) and they will call patient on Monday morning to set up an appointment. I also recommended that the patient has followup with her PCP early next week. I've talked extensively to the patient about proper follow up with cardiology, neurology and her primary care doctor. She expressed understanding. 2. Uncontrolled diabetes type 1 not in DKA - well controlled in the hospital, however with normal sugars patient was experiencing hypoglycemic symptoms. She states that she usually gets these symptoms when her sugars are less than 180. 3. Right great toe osteomyelitis - continue IV daptomycin and ciprofloxacin as recommended by infectious disease consultant.  4. Hypertension - continue medications on discharge. 5. Hyperlipidemia - continue present medications. 6. Depression - continue present medications.  Neurology recommendations Continue aspirin 325 mg orally every day for secondary stroke prevention. TEE to look for embolic source. If positive for PFO (patent foramen ovale), check bilateral lower extremity venous dopplers to rule out DVT as possible source of stroke. TEEs are not performed over the weekend. Ok to do as an OP next week is desired or keep inpatient for TEE Monday.  Please schedule outpatient telemetry monitoring to assess patient for atrial fibrillation as source of stroke. May be arranged with patient's cardiologist.  Patient has a 10-15% risk of having another stroke over the next year, the highest risk is within 2 weeks of the most recent stroke/TIA (risk of having a stroke following a stroke or TIA is the same).  Ongoing risk factor control by Primary Care Physician  Follow up with Dr. Leonie Man, [facility], in 2 months.  The patient is agreeable to get the MRI of the  brain as an outpatient at [place] with some sedation. She can call our office to schedule the study.   Procedures:  2D echo Study Conclusions  - Left ventricle: The cavity size was  normal. Wall thickness was increased in a pattern of mild LVH. The estimated ejection fraction was 65%. Wall motion was normal; there were no regional wall motion abnormalities. - Impressions: No cardiac source of embolism was identified, but cannot be ruled out on the basis of this examination.  Consultations:  Neurology  Discharge Exam: Filed Vitals:   09/23/12 2000 09/24/12 0000 09/24/12 0400 09/24/12 0800  BP: 112/66 91/46 104/69 110/70  Pulse: 86 87 83 79  Temp: 97.5 F (36.4 C)  98.5 F (36.9 C) 98.3 F (36.8 C)  TempSrc: Oral  Oral Oral  Resp:   18 20  Height:      Weight:   72.485 kg (159 lb 12.8 oz)   SpO2: 100% 98% 98% 98%    General: She is in no acute distress Cardiovascular: Heart is regular, without murmurs Respiratory: Clear to auscultation bilaterally.  Discharge Instructions   Future Appointments Provider Department Dept Phone   10/12/2012 9:00 AM Campbell Riches, MD [facility] [phone]       Medication List    TAKE these medications       aspirin 325 MG tablet  Take 1 tablet (325 mg total) by mouth daily.     ciprofloxacin 500 MG tablet  Commonly known as:  CIPRO  Take 500 mg by mouth 2 (two) times daily.     clonazePAM 0.5 MG tablet  Commonly known as:  KLONOPIN  Take 1 tablet (0.5 mg total) by mouth at bedtime.     DAPTOmycin 500 MG injection  Commonly known as:  CUBICIN  Inject 425 mg into the vein daily. [facility] [phone]     DULoxetine 30 MG capsule  Commonly known as:  CYMBALTA  Take 3 capsule daily     HUMALOG 100 UNIT/ML injection  Generic drug:  insulin lispro  Inject 40 Units into the skin 4 (four) times daily.     insulin glargine 100 UNIT/ML injection  Commonly known as:  LANTUS  Inject 40 Units into the skin 2 (two) times daily.     levothyroxine 137 MCG tablet  Commonly known as:  SYNTHROID, LEVOTHROID  Take 137 mcg by mouth daily.      lisinopril-hydrochlorothiazide 20-25 MG per tablet  Commonly known as:  PRINZIDE,ZESTORETIC  Take 1 tablet by mouth daily.     simvastatin 20 MG tablet  Commonly known as:  ZOCOR  Take 20 mg by mouth daily.          The results of significant diagnostics from this hospitalization (including imaging, microbiology, ancillary and laboratory) are listed below for reference.    Significant Diagnostic Studies: Ct Head Wo Contrast  09/22/2012  *RADIOLOGY REPORT*  Clinical Data: Speech disturbance.  CT HEAD WITHOUT CONTRAST  Technique:  Contiguous axial images were obtained from the base of the skull through the vertex without contrast.  Comparison: None  Findings: Ventricle size is normal.  Negative for hemorrhage.  Question ill-defined hypodensity right occipital lobe.  This could be an area of acute infarct or an artifact.  Follow-up MRI if clinically appropriate.  Paranasal sinuses are clear.  Skull is negative.  IMPRESSION: Possible hypodensity right occipital lobe.  This could be an area of acute infarct or an artifact.  MRI  if appropriate.   Original Report Authenticated By: Carl Best, M.D.    Ir Fluoro Guide Cv Line Right  08/31/2012  *RADIOLOGY REPORT*  Clinical Data: Right foot osteomyelitis.  Central venous access is requested for antibiotic therapy  PICC LINE PLACEMENT WITH ULTRASOUND AND FLUOROSCOPIC  GUIDANCE  Fluoroscopy Time: 0.6 minutes.  The right arm was prepped with chlorhexidine, draped in the usual sterile fashion using maximum barrier technique (cap and mask, sterile gown, sterile gloves, large sterile sheet, hand hygiene and cutaneous antisepsis) and infiltrated locally with 1% Lidocaine.  Ultrasound demonstrated patency of the right brachial vein, and this was documented with an image.  Under real-time ultrasound guidance, this vein was accessed with a 21 gauge micropuncture needle and image documentation was performed.  The needle was exchanged over a guidewire for a peel-away  sheath through which a five [place] single lumen PICC trimmed to 41 cm was advanced, positioned with its tip at the lower SVC/right atrial junction. Fluoroscopy during the procedure and fluoro spot radiograph confirms appropriate catheter position.  The catheter was flushed, secured to the skin with Prolene sutures, and covered with a sterile dressing.  Complications:  None  IMPRESSION: Successful right arm PICC line placement with ultrasound and fluoroscopic guidance.  The catheter is ready for use.  Read by: Rowe Robert, P.A.-C   Original Report Authenticated By: Markus Daft, M.D.    Ir US Guide Vasc Access Right  09/02/2012  *RADIOLOGY REPORT*  Clinical Data: Right foot osteomyelitis.  Central venous access is requested for antibiotic therapy  PICC LINE PLACEMENT WITH ULTRASOUND AND FLUOROSCOPIC  GUIDANCE  Fluoroscopy Time: 0.6 minutes.  The right arm was prepped with chlorhexidine, draped in the usual sterile fashion using maximum barrier technique (cap and mask, sterile gown, sterile gloves, large sterile sheet, hand hygiene and cutaneous antisepsis) and infiltrated locally with 1% Lidocaine.  Ultrasound demonstrated patency of the right brachial vein, and this was documented with an image.  Under real-time ultrasound guidance, this vein was accessed with a 21 gauge micropuncture needle and image documentation was performed.  The needle was exchanged over a guidewire for a peel-away sheath through which a five [place] single lumen PICC trimmed to 41 cm was advanced, positioned with its tip at the lower SVC/right atrial junction. Fluoroscopy during the procedure and fluoro spot radiograph confirms appropriate catheter position.  The catheter was flushed, secured to the skin with Prolene sutures, and covered with a sterile dressing.  Complications:  None  IMPRESSION: Successful right arm PICC line placement with ultrasound and fluoroscopic guidance.  The catheter is ready for use.  Read by: Rowe Robert, P.A.-C    Original Report Authenticated By: Markus Daft, M.D.    Labs: Basic Metabolic Panel:  Recent Labs Lab 09/22/12 2147 09/23/12 0510 09/24/12 0542  NA 133* 139 141  K 3.9 3.3* 3.6  CL 95* 103 105  CO2 27 24 28   GLUCOSE 250* 147* 141*  BUN 21 17 16   CREATININE 0.74 0.68 0.66  CALCIUM 8.8 8.5 8.2*   Liver Function Tests:  Recent Labs Lab 09/22/12 2147  AST 23  ALT 35  ALKPHOS 92  BILITOT 0.3  PROT 7.0  ALBUMIN 3.0*   CBC:  Recent Labs Lab 09/22/12 2147 09/23/12 0510  WBC 10.0 8.4  NEUTROABS 5.7  --   HGB 13.3 12.6  HCT 39.1 35.4*  MCV 89.5 88.1  PLT 296 306   Cardiac Enzymes:  Recent Labs Lab 09/22/12 1015 09/22/12 2147  CKTOTAL  51  --   TROPONINI  --  <0.30   CBG:  Recent Labs Lab 09/23/12 0302 09/23/12 1024 09/23/12 1650 09/23/12 2205 09/24/12 0742  GLUCAP 170* 136* 108* 305* 87       Signed:  GHERGHE, COSTIN  [facility] 09/24/2012, 4:10 PM

## 2012-10-08 ENCOUNTER — Emergency Department (HOSPITAL_COMMUNITY): Payer: Medicaid Other

## 2012-10-08 ENCOUNTER — Emergency Department (HOSPITAL_COMMUNITY)
Admission: EM | Admit: 2012-10-08 | Discharge: 2012-10-08 | Disposition: A | Discharge status: Home / Self Care | Payer: Medicaid Other | Attending: Emergency Medicine | Admitting: Emergency Medicine

## 2012-10-08 DIAGNOSIS — E1169 Type 2 diabetes mellitus with other specified complication: Secondary | ICD-10-CM | POA: Insufficient documentation

## 2012-10-08 DIAGNOSIS — R079 Chest pain, unspecified: Secondary | ICD-10-CM

## 2012-10-08 DIAGNOSIS — I10 Essential (primary) hypertension: Secondary | ICD-10-CM | POA: Insufficient documentation

## 2012-10-08 DIAGNOSIS — E785 Hyperlipidemia, unspecified: Secondary | ICD-10-CM | POA: Insufficient documentation

## 2012-10-08 DIAGNOSIS — E7800 Pure hypercholesterolemia, unspecified: Secondary | ICD-10-CM | POA: Insufficient documentation

## 2012-10-08 DIAGNOSIS — Z794 Long term (current) use of insulin: Secondary | ICD-10-CM | POA: Insufficient documentation

## 2012-10-08 DIAGNOSIS — R0602 Shortness of breath: Secondary | ICD-10-CM | POA: Insufficient documentation

## 2012-10-08 DIAGNOSIS — Z8669 Personal history of other diseases of the nervous system and sense organs: Secondary | ICD-10-CM | POA: Insufficient documentation

## 2012-10-08 DIAGNOSIS — Z79899 Other long term (current) drug therapy: Secondary | ICD-10-CM | POA: Insufficient documentation

## 2012-10-08 DIAGNOSIS — R0789 Other chest pain: Secondary | ICD-10-CM | POA: Insufficient documentation

## 2012-10-08 DIAGNOSIS — G43909 Migraine, unspecified, not intractable, without status migrainosus: Secondary | ICD-10-CM | POA: Insufficient documentation

## 2012-10-08 DIAGNOSIS — M8610 Other acute osteomyelitis, unspecified site: Secondary | ICD-10-CM | POA: Insufficient documentation

## 2012-10-08 DIAGNOSIS — M9080 Osteopathy in diseases classified elsewhere, unspecified site: Secondary | ICD-10-CM | POA: Insufficient documentation

## 2012-10-08 DIAGNOSIS — F411 Generalized anxiety disorder: Secondary | ICD-10-CM | POA: Insufficient documentation

## 2012-10-08 DIAGNOSIS — Z7982 Long term (current) use of aspirin: Secondary | ICD-10-CM | POA: Insufficient documentation

## 2012-10-08 DIAGNOSIS — E039 Hypothyroidism, unspecified: Secondary | ICD-10-CM | POA: Insufficient documentation

## 2012-10-08 DIAGNOSIS — Z8673 Personal history of transient ischemic attack (TIA), and cerebral infarction without residual deficits: Secondary | ICD-10-CM | POA: Insufficient documentation

## 2012-10-08 LAB — CBC WITH DIFFERENTIAL/PLATELET
Basophils Absolute: 0.1 10*3/uL (ref 0.0–0.1)
Basophils Relative: 0 % (ref 0–1)
Eosinophils Absolute: 0.2 10*3/uL (ref 0.0–0.7)
Eosinophils Relative: 1 % (ref 0–5)
HCT: 37.7 % (ref 36.0–46.0)
Hemoglobin: 13.4 g/dL (ref 12.0–15.0)
Lymphocytes Relative: 10 % — ABNORMAL LOW (ref 12–46)
Lymphs Abs: 1.5 10*3/uL (ref 0.7–4.0)
MCH: 30.7 pg (ref 26.0–34.0)
MCHC: 35.5 g/dL (ref 30.0–36.0)
MCV: 86.5 fL (ref 78.0–100.0)
Monocytes Absolute: 1.1 10*3/uL — ABNORMAL HIGH (ref 0.1–1.0)
Monocytes Relative: 7 % (ref 3–12)
Neutro Abs: 11.8 10*3/uL — ABNORMAL HIGH (ref 1.7–7.7)
Neutrophils Relative %: 81 % — ABNORMAL HIGH (ref 43–77)
Platelets: 300 10*3/uL (ref 150–400)
RBC: 4.36 MIL/uL (ref 3.87–5.11)
RDW: 12.2 % (ref 11.5–15.5)
WBC: 14.6 10*3/uL — ABNORMAL HIGH (ref 4.0–10.5)

## 2012-10-08 LAB — BASIC METABOLIC PANEL
BUN: 21 mg/dL (ref 6–23)
CO2: 25 mEq/L (ref 19–32)
Calcium: 9.2 mg/dL (ref 8.4–10.5)
Chloride: 100 mEq/L (ref 96–112)
Creatinine, Ser: 0.84 mg/dL (ref 0.50–1.10)
GFR calc Af Amer: >90 mL/min (ref >90)
GFR calc non Af Amer: 80 mL/min — ABNORMAL LOW (ref >90)
Glucose, Bld: 75 mg/dL (ref 70–99)
Potassium: 3.1 mEq/L — ABNORMAL LOW (ref 3.5–5.1)
Sodium: 137 mEq/L (ref 135–145)

## 2012-10-08 LAB — GLUCOSE, CAPILLARY
Glucose-Capillary: 53 mg/dL — ABNORMAL LOW (ref 70–99)
Glucose-Capillary: 143 mg/dL — ABNORMAL HIGH (ref 70–99)

## 2012-10-08 LAB — TROPONIN I: Troponin I: <0.3 ng/mL (ref <0.30)

## 2012-10-08 MED ORDER — POTASSIUM CHLORIDE CRYS ER 20 MEQ PO TBCR
40.0000 meq | EXTENDED_RELEASE_TABLET | Freq: Once | ORAL | Status: AC
  Administered 2012-10-08: 40 meq via ORAL
  Filled 2012-10-08: qty 2

## 2012-10-08 NOTE — ED Provider Notes (Signed)
History     CSN: [id number]  Arrival date & time 10/08/12  1709   First MD Initiated Contact with Patient 10/08/12 1730      Chief Complaint  Patient presents with  . Nausea  . Emesis    (Consider location/radiation/quality/duration/timing/severity/associated sxs/prior treatment) HPI Comments: Patient is a 52 year old female with a past medical history of diabetes, HLD, and hypertension who presents for multiple complaints with the onset approximately 45 minutes ago. Patient states that she was at home when she felt a sharp, pressure-like chest pain in the center of her chest at her costal angle. Patient states that the pain was nonradiating and lasted approximately 30 minutes. Patient states that she had some difficulty breathing associated with this pain, claiming that her shortness of breath was improved by "hunching over" forward. Patient also states she had an electrical-type sensation running through her brain. Patient states "it felt like I was willingly sticking my finger in an electrical socket". Patient called EMS and upon standing became nauseous and had one episode of nonbloody, nonbilious emesis. Patient states that all of her symptoms have resolved at this time. Patient is febrile to 37.8C in the emergency department, though she states she has not felt feverish and had no chills or diaphoresis. Patient denies vision changes, hearing loss or tinnitus, abdominal pain, diarrhea, melena, hematochezia, weakness, or numbness or tingling in her extremities different from her baseline. Patient is an insulin-dependent diabetic and states that she took her insulin as prescribed today. Patient as well and nontoxic appearing in no acute distress at this time.  Patient is a 52 y.o. female presenting with vomiting. The history is provided by the patient. No language interpreter was used.  Emesis Associated symptoms: no abdominal pain, no chills, no diarrhea and no headaches     Past Medical  History  Diagnosis Date  . HTN (hypertension)   . Macular degeneration, bilateral   . Hypercholesteremia   . Hypothyroidism   . Diabetes mellitus type I   . Migraines 1980's    "when I was in my 20's" (09/23/2012)  . Stroke ? date & 09/22/2012  . Peripheral neuropathy   . Anxiety   . Diabetic osteomyelitis     right great toe/progress notes 09/23/2012    Past Surgical History  Procedure Laterality Date  . Cesarean section  1989; 1995  . Tonsillectomy  1965  . Refractive surgery Bilateral 2013  . Total thyroidectomy  2012  . Peripherally inserted central catheter insertion Right 08/31/2012  . I&d extremity Right     "big toe"; multiple    Family History  Problem Relation Age of Onset  . Depression Mother     History  Substance Use Topics  . Smoking status: Never Smoker   . Smokeless tobacco: Never Used  . Alcohol Use: No    OB History   Grav Para Term Preterm Abortions TAB SAB Ect Mult Living                  Review of Systems  Constitutional: Negative for chills and diaphoresis.  HENT: Negative for hearing loss, neck pain and tinnitus.   Eyes: Negative for visual disturbance.  Respiratory: Positive for shortness of breath.   Cardiovascular: Positive for chest pain. Negative for leg swelling.  Gastrointestinal: Positive for nausea and vomiting. Negative for abdominal pain and diarrhea.  Genitourinary: Negative for dysuria and hematuria.  Skin: Negative for color change and pallor.  Neurological: Negative for dizziness, syncope, weakness, light-headedness and headaches.  All other systems reviewed and are negative.    Allergies  Erythromycin and Vancomycin  Home Medications   Current Outpatient Rx  Name  Route  Sig  Dispense  Refill  . aspirin 325 MG tablet   Oral   Take 1 tablet (325 mg total) by mouth daily.   90 tablet   2   . ciprofloxacin (CIPRO) 500 MG tablet   Oral   Take 500 mg by mouth 2 (two) times daily.         . clonazePAM  (KLONOPIN) 0.5 MG tablet   Oral   Take 1 tablet (0.5 mg total) by mouth at bedtime.   30 tablet   1   . DAPTOmycin (CUBICIN) 500 MG injection   Intravenous   Inject 425 mg into the vein daily. [facility] [phone]         . DULoxetine (CYMBALTA) 30 MG capsule      90 mg. Take 3 capsule daily         . HUMALOG 100 UNIT/ML injection   Subcutaneous   Inject 40 Units into the skin 4 (four) times daily.          . insulin glargine (LANTUS) 100 UNIT/ML injection   Subcutaneous   Inject 40 Units into the skin 2 (two) times daily.          Marland Kitchen levothyroxine (SYNTHROID, LEVOTHROID) 137 MCG tablet   Oral   Take 137 mcg by mouth daily.           Marland Kitchen lisinopril-hydrochlorothiazide (PRINZIDE,ZESTORETIC) 20-25 MG per tablet   Oral   Take 1 tablet by mouth daily.           . simvastatin (ZOCOR) 20 MG tablet   Oral   Take 20 mg by mouth daily.            BP 99/61  Pulse 109  Temp(Src) 99.1 F (37.3 C) (Oral)  Resp 21  SpO2 98%  Physical Exam  Nursing note and vitals reviewed. Constitutional: She is oriented to person, place, and time. She appears well-developed and well-nourished. No distress.  HENT:  Head: Normocephalic and atraumatic.  Mouth/Throat: Oropharynx is clear and moist. No oropharyngeal exudate.  Symmetric rise of the uvula with phonation  Eyes: Conjunctivae are normal. Pupils are equal, round, and reactive to light. No scleral icterus.  Neck: Normal range of motion. Neck supple.  No meningeal signs  Cardiovascular: Normal rate, regular rhythm, normal heart sounds and intact distal pulses.   Pulmonary/Chest: Effort normal and breath sounds normal.  Abdominal: Soft. Bowel sounds are normal. She exhibits no distension. There is no tenderness. There is no rebound and no guarding.  Musculoskeletal: Normal range of motion. She exhibits no edema.  Lymphadenopathy:    She has no cervical adenopathy.  Neurological: She is alert and oriented to  person, place, and time. No cranial nerve deficit.  Cranial nerves II through XII grossly intact. No sensory or motor deficits appreciated. Patient has equal grip strength bilaterally and DTRs are normal and symmetric. Muscle strength against resistance 5 out of 5 in bilateral upper and lower extremities.  Skin: Skin is warm and dry. No rash noted. She is not diaphoretic. No erythema.  Psychiatric: She has a normal mood and affect. Her behavior is normal.    ED Course  Procedures (including critical care time)  Labs Reviewed  CBC WITH DIFFERENTIAL - Abnormal; Notable for the following:    WBC 14.6 (*)  Neutrophils Relative 81 (*)    Neutro Abs 11.8 (*)    Lymphocytes Relative 10 (*)    Monocytes Absolute 1.1 (*)    All other components within normal limits  BASIC METABOLIC PANEL - Abnormal; Notable for the following:    Potassium 3.1 (*)    GFR calc non Af Amer 80 (*)    All other components within normal limits  GLUCOSE, CAPILLARY - Abnormal; Notable for the following:    Glucose-Capillary 53 (*)    All other components within normal limits  TROPONIN I   Dg Chest 2 View  10/08/2012  *RADIOLOGY REPORT*  Clinical Data: Shortness of breath, nausea, vomiting, hypertension.  CHEST - 2 VIEW  Comparison: 08/18/2012  Findings: Right PICC line is in place with the tip in the upper right atrium.  Low lung volumes.  No focal airspace opacities or effusions.  Heart is normal size.  No acute bony abnormality.  IMPRESSION: No acute cardiopulmonary disease.   Original Report Authenticated By: Rolm Baptise, M.D.      Date: 10/08/2012  Rate: 108  Rhythm: sinus tachycardia  QRS Axis: normal  Intervals: normal  ST/T Wave abnormalities: normal  Conduction Disutrbances:none  Narrative Interpretation: sinus tachycardia without STEMI  Old EKG Reviewed: patient's EKG from 09/22/2012 NSR without STEMI I have personally reviewed and interpreted this EKG   1. Chest pain      MDM  Patient  presents for a variety of vague complaints beginning with sharp pressure-like chest pain that was nonradiating along with shortness of breath. Patient was given cardiac workup; EKG without evidence of STEMI, troponin normal and DG chest without acute cardiopulmonary changes. Patient not orthostatic or symptomatic when changing position. Patient's blood sugar dropped to 53 during stay in emergency department; she was given apple juice and food by mouth which raised her CBG to 143. Patient also found to be mildly hypokalemic which was corrected with 40 mg K-Dur.   Given these findings low suspicion for both ACS and PE on examination in ED today; patient has a TIMI score of 2 and Well's PE score of 1.5. Patient asymptomatic, hemodynamically stable, well and nontoxic appearing, and in no acute distress. Stable for d/c with primary care follow up in 1 day regarding symptoms. Indications for ED return discussed. Patient states comfort and understanding with this d/c plan with no unaddressed concerns. Patient work up and ED management discussed with Dr. Betsey Holiday who is in agreement.      Antonietta Breach, PA-C 10/09/12 1156

## 2012-10-08 NOTE — ED Notes (Addendum)
Pt from home. Reports nausea, vomiting, and chest discomfort. Pt has a single lumen picc in right brachial for iv antiboitic for bone infection in her toe. Pt has a history of diabetes. CBG 96 per EMS.  Pt reports feeling better post emesis. Pt given 4mg  zofran via 20g piv in left forearm en route to ER.

## 2012-10-09 NOTE — ED Provider Notes (Signed)
Medical screening examination/treatment/procedure(s) were performed by non-physician practitioner and as supervising physician I was immediately available for consultation/collaboration.  Orpah Greek, MD 10/09/12 [phone]

## 2012-10-11 ENCOUNTER — Encounter (HOSPITAL_BASED_OUTPATIENT_CLINIC_OR_DEPARTMENT_OTHER): Payer: Medicaid Other | Attending: General Surgery

## 2012-10-11 DIAGNOSIS — M9080 Osteopathy in diseases classified elsewhere, unspecified site: Secondary | ICD-10-CM | POA: Insufficient documentation

## 2012-10-11 DIAGNOSIS — M869 Osteomyelitis, unspecified: Secondary | ICD-10-CM | POA: Insufficient documentation

## 2012-10-11 DIAGNOSIS — L84 Corns and callosities: Secondary | ICD-10-CM | POA: Insufficient documentation

## 2012-10-11 DIAGNOSIS — L97509 Non-pressure chronic ulcer of other part of unspecified foot with unspecified severity: Secondary | ICD-10-CM | POA: Insufficient documentation

## 2012-10-11 DIAGNOSIS — E1169 Type 2 diabetes mellitus with other specified complication: Secondary | ICD-10-CM | POA: Insufficient documentation

## 2012-10-12 ENCOUNTER — Encounter: Payer: Self-pay | Admitting: Infectious Diseases

## 2012-10-12 ENCOUNTER — Ambulatory Visit (INDEPENDENT_AMBULATORY_CARE_PROVIDER_SITE_OTHER): Payer: Medicaid Other | Admitting: Infectious Diseases

## 2012-10-12 VITALS — BP 111/76 | HR 99 | Temp 98.1°F | Ht 63.0 in | Wt 159.0 lb

## 2012-10-12 DIAGNOSIS — M869 Osteomyelitis, unspecified: Secondary | ICD-10-CM

## 2012-10-12 DIAGNOSIS — E1065 Type 1 diabetes mellitus with hyperglycemia: Secondary | ICD-10-CM

## 2012-10-12 DIAGNOSIS — M9080 Osteopathy in diseases classified elsewhere, unspecified site: Secondary | ICD-10-CM

## 2012-10-12 DIAGNOSIS — E1169 Type 2 diabetes mellitus with other specified complication: Secondary | ICD-10-CM

## 2012-10-12 DIAGNOSIS — IMO0002 Reserved for concepts with insufficient information to code with codable children: Secondary | ICD-10-CM

## 2012-10-12 HISTORY — PX: PICC LINE REMOVAL (ARMC HX): HXRAD1261

## 2012-10-12 LAB — SEDIMENTATION RATE: Sed Rate: 51 mm/hr — ABNORMAL HIGH (ref 0–22)

## 2012-10-12 LAB — C-REACTIVE PROTEIN: CRP: 5.5 mg/dL — ABNORMAL HIGH (ref <0.60)

## 2012-10-12 NOTE — Progress Notes (Signed)
Per Dr Johnnye Sima, PICC removed from patient's right arm.  41 cm PICC removed from R arm site unremarkable with sutures intact.   Sutures removed , site cleansed with chlorhexidine and petroleum dressing applied.  Pt advised not to use right arm for heavy lifting and to leave dressing in place for 24 hours and call the office is dressing becomes soaked with blood.  Pt tolerated procedure well.    Orland Mustard, RN

## 2012-10-12 NOTE — Progress Notes (Signed)
  Subjective:    Patient ID: Suzanne Allen, female    DOB: 05-29-1961, 52 y.o.   MRN: [id number]  HPI 52 yo F with hx of anxiety, DM1 (since 52 yo) and 1 yr of wound on R great toe. Has had repeated debridements of her toe.  Had amnio-derma-graft (June 2013) and was in cast (for 6 weeks) and it healed. After 1 week had cracked again and has since ulcerated and "turned into a bone infection".  MRI on 1-0-14 showed: 1. Right great toe cellulitis. No evidence of abscess. 2. Abnormal marrow edema and enhancement throughout the distal phalanx of the great toe with suspected early cortical erosion at  the tuft, suspicious for oteomyelitis. The proximal phalanx and  first metatarsal appear normal.  Started on vanco/oral cipro on 08-29-12 with a plan for 6 weeks of therapy On 09-22-12 was changed to dapto/cipro due to concerns about drug rash. Completes anbx today. Her wound has decreased in size. Doesn't hurt. No d/c. Still f/u with wound care center.   FSGs in the 150s   Review of Systems  Constitutional: Negative for fever and chills.       Objective:   Physical Exam  Constitutional: She appears well-developed and well-nourished.  Musculoskeletal:       Arms:      Feet:          Assessment & Plan:

## 2012-10-12 NOTE — Assessment & Plan Note (Signed)
Will pull her PIC today. Repeat her ESR and CRP. She has f/u with wound care center and will await further wound documentation from them. I asked pt to return to clinic when boot is off and we can repeat her MRI of her foot then.

## 2012-10-12 NOTE — Assessment & Plan Note (Signed)
Continues to struggle with control.

## 2012-10-24 ENCOUNTER — Other Ambulatory Visit: Payer: Self-pay | Admitting: Infectious Diseases

## 2012-10-24 DIAGNOSIS — E1169 Type 2 diabetes mellitus with other specified complication: Secondary | ICD-10-CM

## 2012-10-24 DIAGNOSIS — M869 Osteomyelitis, unspecified: Secondary | ICD-10-CM

## 2012-10-25 ENCOUNTER — Encounter: Payer: Self-pay | Admitting: Cardiovascular Disease

## 2012-10-25 ENCOUNTER — Ambulatory Visit (INDEPENDENT_AMBULATORY_CARE_PROVIDER_SITE_OTHER): Payer: Medicaid Other | Admitting: Cardiovascular Disease

## 2012-10-25 VITALS — BP 113/67 | HR 106 | Ht 63.0 in | Wt 162.0 lb

## 2012-10-25 DIAGNOSIS — G459 Transient cerebral ischemic attack, unspecified: Secondary | ICD-10-CM

## 2012-10-25 NOTE — Progress Notes (Signed)
HPI:  52 year old woman with long-standing type 1 diabetes. She presents today for initial cardiac evaluation. The patient was hospitalized March 13-15 with a TIA. She developed sudden onset of expressive aphasia with total duration of symptoms about 15 minutes. She denied any symptoms of numbness/tingling/weakness of her extremities. She had no facial droop or visual change. She had no headache. A CT scan of the brain demonstrated "possible hypodensity right occipital lobe. This could be an area of acute infarct or an artifact." CT findings were not felt consistent with the patient's symptoms based on my review of the neurology consultation. She was noted to have a blood glucose of greater than 450 of her presentation. An echocardiogram was done while she was in the hospital. This was also within normal limits. A TEE was recommended as part of the stroke protocol. The patient requested to leave the hospital before this was done. She presents today for followup evaluation and more discussion of whether she needs a TEE and further studies completed.  She's had no further neurologic symptoms. She specifically denies visual changes, headache, weakness or clumsiness of the extremities, or aphasia. She has chronic neuropathic symptoms of her feet. She's had no chest pain, chest pressure, dyspnea, or palpitations.  She's had a PICC line in place which is now been removed. She's undergoing continued wound care for osteomyelitis in her foot.  Outpatient Encounter Prescriptions as of 10/25/2012  Medication Sig Dispense Refill  . aspirin 325 MG tablet Take 1 tablet (325 mg total) by mouth daily.  90 tablet  2  . clonazePAM (KLONOPIN) 0.5 MG tablet Take 1 tablet (0.5 mg total) by mouth at bedtime.  30 tablet  1  . DULoxetine (CYMBALTA) 30 MG capsule 90 mg. Take 3 capsule daily      . HUMALOG 100 UNIT/ML injection Inject 40 Units into the skin 4 (four) times daily.       . insulin glargine (LANTUS) 100 UNIT/ML  injection Inject 40 Units into the skin 2 (two) times daily.       Marland Kitchen levothyroxine (SYNTHROID, LEVOTHROID) 137 MCG tablet Take 137 mcg by mouth daily.        Marland Kitchen lisinopril-hydrochlorothiazide (PRINZIDE,ZESTORETIC) 20-25 MG per tablet Take 1 tablet by mouth daily.       . simvastatin (ZOCOR) 20 MG tablet Take 20 mg by mouth daily.       . [DISCONTINUED] ciprofloxacin (CIPRO) 500 MG tablet Take 500 mg by mouth 2 (two) times daily.      . [DISCONTINUED] DAPTOmycin (CUBICIN) 500 MG injection Inject 425 mg into the vein daily. [facility] [phone]       No facility-administered encounter medications on file as of 10/25/2012.    Erythromycin and Vancomycin  Past Medical History  Diagnosis Date  . HTN (hypertension)   . Macular degeneration, bilateral   . Hypercholesteremia   . Hypothyroidism   . Diabetes mellitus type I   . Migraines 1980's    "when I was in my 20's" (09/23/2012)  . Stroke ? date & 09/22/2012  . Peripheral neuropathy   . Anxiety   . Diabetic osteomyelitis     right great toe/progress notes 09/23/2012    Past Surgical History  Procedure Laterality Date  . Cesarean section  1989; 1995  . Tonsillectomy  1965  . Refractive surgery Bilateral 2013  . Total thyroidectomy  2012  . Peripherally inserted central catheter insertion Right 08/31/2012  . I&d extremity Right     "big  toe"; multiple    History   Social History  . Marital Status: Single    Spouse Name: N/A    Number of Children: N/A  . Years of Education: N/A   Occupational History  . Not on file.   Social History Main Topics  . Smoking status: Never Smoker   . Smokeless tobacco: Never Used  . Alcohol Use: No  . Drug Use: No  . Sexually Active: Not Currently   Other Topics Concern  . Not on file   Social History Narrative  . No narrative on file    Family History  Problem Relation Age of Onset  . Depression Mother     ROS:  General: no fevers/chills/night sweats Eyes: no  blurry vision, diplopia, or amaurosis ENT: no sore throat or hearing loss Resp: no cough, wheezing, or hemoptysis CV: no edema or palpitations GI: no abdominal pain, nausea, vomiting, diarrhea, or constipation GU: no dysuria, frequency, or hematuria Skin: no rash Neuro: no headache, numbness, tingling, or weakness of extremities, otherwise see history of present illness. Positive for neuropathy of both feet Musculoskeletal: no joint pain or swelling Heme: no bleeding, DVT, or easy bruising Endo: no polydipsia or polyuria  BP 113/67  Pulse 106  Ht 5\' 3"  (1.6 m)  Wt 73.483 kg (162 lb)  BMI 28.7 kg/m2  PHYSICAL EXAM: Pt is alert and oriented, WD, WN, in no distress. HEENT: normal Neck: JVP normal. Carotid upstrokes normal without bruits. No thyromegaly. Lungs: equal expansion, clear bilaterally CV: Apex is discrete and nondisplaced, RRR without murmur or gallop Abd: soft, NT, +BS, no bruit, no hepatosplenomegaly Back: no CVA tenderness Ext: no C/C/E        DP/PT pulses intact and =        The right great toe is wrapped with a dressing Neuro: CNII-XII intact             Strength intact = bilaterally  EKG:  Sinus tachycardia 106 beats per minute, cannot rule out age-indeterminate anterior infarction. Otherwise within normal limits.  2 D Echo: Left ventricle: The cavity size was normal. Wall thickness was increased in a pattern of mild LVH. The estimated ejection fraction was 65%. Wall motion was normal; there were no regional wall motion abnormalities.  ------------------------------------------------------------ Aortic valve: Structurally normal valve. Cusp separation was normal. Doppler: Transvalvular velocity was within the normal range. There was no stenosis. No regurgitation.  ------------------------------------------------------------ Aorta: Aortic root: The aortic root was normal in size.  ------------------------------------------------------------ Mitral valve:  Structurally normal valve. Leaflet separation was normal. Doppler: Transvalvular velocity was within the normal range. There was no evidence for stenosis. No regurgitation. Peak gradient: 28mm Hg (D).  ------------------------------------------------------------ Left atrium: The atrium was normal in size.  ------------------------------------------------------------ Right ventricle: The cavity size was normal. Systolic function was normal.  ------------------------------------------------------------ Pulmonic valve: The valve appears to be grossly normal. Doppler: No significant regurgitation.  ------------------------------------------------------------ Tricuspid valve: Structurally normal valve. Leaflet separation was normal. Doppler: Transvalvular velocity was within the normal range. No regurgitation.  ------------------------------------------------------------ Right atrium: The atrium was at the upper limits of normal in size.  ------------------------------------------------------------ Pericardium: There was no pericardial effusion.  Carotid Doppler: Summary:  - Technically difficult due to extremely high bifurcation at the jaw line bilaterally. - No significant extracranial carotid artery stenosis demonstrated. Vertebrals are patent with antegrade flow.  ASSESSMENT AND PLAN: TIA. Studies from her hospitalization were reviewed. These included her CT scan of the brain, 2-D echocardiogram, and carotid Doppler study. An MRI  of the brain was not completed. We discussed consideration of further cardiac testing. This was reviewed in the context of potential etiologies for a TIA. The TEE could potentially demonstrate a patent foramen ovale or ASD which can be missed on a 2-D echocardiogram. Of note, the patient's right atrial and right ventricular size was normal. There is a possibility of paradoxical embolus in a patient who had a PICC line in place. Based on my review of the  neurology consultation, it is not felt that the findings on her CT scan of the brain were consistent with her clinical symptoms and this may have been artifactual. She seems stable on antiplatelet therapy. The other consideration for cardiac source of embolus would be paroxysmal atrial fibrillation. Therefore, discussed placement of an event recorder. At this time, the patient is not interested in any further cardiac studies. She has followup scheduled with Dr. Leonie Man in July. Further plans and diagnostic evaluation based on his assessment when he sees her at that time. I did encourage her to continue on aspirin for antiplatelet therapy. Her medical therapy was otherwise reviewed and she is on appropriate treatment with a statin drug and an ACE inhibitor. I would be happy to see her back as needed.  Sherren Mocha 10/27/2012 8:03 AM

## 2012-10-25 NOTE — Patient Instructions (Addendum)
Your physician recommends that you schedule a follow-up appointment as needed with Dr Cooper.    

## 2012-10-27 ENCOUNTER — Encounter: Payer: Self-pay | Admitting: Cardiovascular Disease

## 2012-10-27 ENCOUNTER — Telehealth: Payer: Self-pay | Admitting: *Deleted

## 2012-10-27 NOTE — Telephone Encounter (Signed)
MRI denied, reopened case for clinical review. Gave abnormal sed rate and crp results from 10/12/12. Awaiting fax with insurances decision. If still denied may need a peer to peer review. Myrtis Hopping

## 2012-11-01 NOTE — Telephone Encounter (Signed)
4/22 denied again - needs plain films ordered first then they will reassess.

## 2012-11-03 ENCOUNTER — Encounter: Payer: Self-pay | Admitting: Infectious Diseases

## 2012-11-07 ENCOUNTER — Other Ambulatory Visit: Payer: Self-pay | Admitting: Infectious Diseases

## 2012-11-07 ENCOUNTER — Telehealth: Payer: Self-pay | Admitting: Licensed Clinical Social Worker

## 2012-11-07 DIAGNOSIS — E1169 Type 2 diabetes mellitus with other specified complication: Secondary | ICD-10-CM

## 2012-11-07 NOTE — Telephone Encounter (Signed)
Patient received a denial letter from [facility] for her MRI, they want her to have an x ray first. Provider can also do a peer to peer to help with the MRI approval. Please advise.

## 2012-11-07 NOTE — Telephone Encounter (Signed)
Ok thanks 

## 2012-11-07 NOTE — Telephone Encounter (Signed)
Thanks, Kennyth Lose has sent her for plain film.

## 2012-11-08 ENCOUNTER — Ambulatory Visit
Admission: RE | Admit: 2012-11-08 | Discharge: 2012-11-08 | Disposition: A | Discharge status: Home / Self Care | Payer: Medicaid Other | Source: Ambulatory Visit | Attending: Infectious Diseases | Admitting: Infectious Diseases

## 2012-11-08 DIAGNOSIS — E1169 Type 2 diabetes mellitus with other specified complication: Secondary | ICD-10-CM

## 2012-11-08 DIAGNOSIS — M869 Osteomyelitis, unspecified: Secondary | ICD-10-CM

## 2012-11-08 NOTE — Telephone Encounter (Signed)
Plain film  

## 2012-11-10 HISTORY — PX: TOE AMPUTATION: SHX809

## 2012-11-15 ENCOUNTER — Encounter (HOSPITAL_BASED_OUTPATIENT_CLINIC_OR_DEPARTMENT_OTHER): Payer: Medicaid Other | Attending: General Surgery

## 2012-11-15 DIAGNOSIS — L97509 Non-pressure chronic ulcer of other part of unspecified foot with unspecified severity: Secondary | ICD-10-CM | POA: Insufficient documentation

## 2012-11-15 DIAGNOSIS — E1169 Type 2 diabetes mellitus with other specified complication: Secondary | ICD-10-CM | POA: Insufficient documentation

## 2012-11-15 DIAGNOSIS — L84 Corns and callosities: Secondary | ICD-10-CM | POA: Insufficient documentation

## 2012-11-21 ENCOUNTER — Encounter (HOSPITAL_COMMUNITY): Payer: Self-pay | Admitting: Psychiatry

## 2012-11-21 ENCOUNTER — Ambulatory Visit (INDEPENDENT_AMBULATORY_CARE_PROVIDER_SITE_OTHER): Payer: Federal, State, Local not specified - Other | Admitting: Psychiatry

## 2012-11-21 VITALS — BP 115/73 | HR 96 | Ht 63.0 in | Wt 160.6 lb

## 2012-11-21 DIAGNOSIS — F4310 Post-traumatic stress disorder, unspecified: Secondary | ICD-10-CM

## 2012-11-21 DIAGNOSIS — F419 Anxiety disorder, unspecified: Secondary | ICD-10-CM

## 2012-11-21 DIAGNOSIS — F329 Major depressive disorder, single episode, unspecified: Secondary | ICD-10-CM

## 2012-11-21 DIAGNOSIS — F3289 Other specified depressive episodes: Secondary | ICD-10-CM

## 2012-11-21 MED ORDER — CLONAZEPAM 0.5 MG PO TABS: 0.5000 mg | ORAL_TABLET | Freq: Every day | ORAL | Status: DC

## 2012-11-21 MED ORDER — DULOXETINE HCL 30 MG PO CPEP: ORAL_CAPSULE | ORAL | Status: DC

## 2012-11-21 NOTE — Progress Notes (Signed)
Suzanne Allen Progress Note  Aman Nourse [id number] 52 y.o.  11/21/2012 3:30 PM  Chief Complaint: Medication management and followup.  History of Present Illness: Patient is 52 year old Caucasian widowed female who came for her followup appointment.  She's compliant with her Klonopin Cymbalta.  She has TIA 2 weeks ago.  She was seen by cardiologist however no new medication added.  She is not getting any treatment for her vision impairment.  She told her insurance is not paying and it is too costly for her to pay.  She is sleeping better.  She denies any recent agitation anger or mood swing.  She is still that she has no more osteomyelitis.  She is not taking any pain medication.  She requires Klonopin for sleep.  She is concerned about her physical health however there has been no recent panic attack or anxiety attack.  She's not drinking or using any illegal substance.  Suicidal Ideation: No Plan Formed: No Patient has means to carry out plan: No  Homicidal Ideation: No Plan Formed: No Patient has means to carry out plan: No  Review of Systems: Psychiatric: Agitation: No Hallucination: No Depressed Mood: Yes Insomnia: No Hypersomnia: No Altered Concentration: No Feels Worthless: No Grandiose Ideas: No Belief In Special Powers: No New/Increased Substance Abuse: No Compulsions: No  Neurologic: Headache: No Seizure: No Paresthesias: Diabetic neuropathy in both feet  Medical History:  Patient has history of hypothyroidism, insulin-dependent diabetes, diabetic neuropathy, hypertension macular degeneration and recent TIA.  Her primary care physician is Dr. Veva Holes.  Family history. Patient endorsed mother has significant depression she died in 10-24-00.  Alcohol and substance use history. Patient denies any history of alcohol or illegal substance use.  Psychosocial history. Patient is born and raised in [place].  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  She has one daughter who lives in [place].  Patient has no contact with her.  Patient is getting royalties from her husband's pattern who was very creative and involved in computer IT business.  Her husband committed suicide in 1994/10/25.  Outpatient Encounter Prescriptions as of 11/21/2012  Medication Sig Dispense Refill  . aspirin 325 MG tablet Take 1 tablet (325 mg total) by mouth daily.  90 tablet  2  . clonazePAM (KLONOPIN) 0.5 MG tablet Take 1 tablet (0.5 mg total) by mouth at bedtime.  30 tablet  1  . DULoxetine (CYMBALTA) 30 MG capsule Take 3 capsule daily  90 capsule  1  . HUMALOG 100 UNIT/ML injection Inject 40 Units into the skin 4 (four) times daily.       . insulin glargine (LANTUS) 100 UNIT/ML injection Inject 40 Units into the skin 2 (two) times daily.       Marland Kitchen levothyroxine (SYNTHROID, LEVOTHROID) 137 MCG tablet Take 137 mcg by mouth daily.        Marland Kitchen lisinopril-hydrochlorothiazide (PRINZIDE,ZESTORETIC) 20-25 MG per tablet Take 1 tablet by mouth daily.       . simvastatin (ZOCOR) 20 MG tablet Take 20 mg by mouth daily.       . [DISCONTINUED] clonazePAM (KLONOPIN) 0.5 MG tablet Take 1 tablet (0.5 mg total) by mouth at bedtime.  30 tablet  1  . [DISCONTINUED] DULoxetine (CYMBALTA) 30 MG capsule 90 mg. Take 3 capsule daily       No facility-administered encounter medications on file as of 11/21/2012.    Past Psychiatric History/Hospitalization(s): Patient has history of depression and  posttraumatic stress disorder for past 16 years. Patient husband committed suicide in 1996. She witnessed when she saw his body hanging. Patient has taken in the past Effexor Prozac and Celexa. Patient endorse history of severe flashback, hearing her husband voice and nightmares. She denies any history of previous suicidal attempt. Patient denies any previous inpatient psychiatric treatment.   Anxiety: Yes Bipolar Disorder: No Depression: Yes Mania: No Psychosis:  No Schizophrenia: No Personality Disorder: No Hospitalization for psychiatric illness: No History of Electroconvulsive Shock Therapy: No Prior Suicide Attempts: No  Physical Exam: Constitutional:  BP 115/73  Pulse 96  Ht 5\' 3"  (1.6 m)  Wt 160 lb 9.6 oz (72.848 kg)  BMI 28.46 kg/m2  General Appearance: alert, oriented, no acute distress and well nourished  Musculoskeletal: Strength & Muscle Tone: within normal limits Gait & Station: normal Patient leans: N/A  Psychiatric: Speech (describe rate, volume, coherence, spontaneity, and abnormalities if any):  Clear and coherent.  Thought Process (describe rate, content, abstract reasoning, and computation):  Organized logical and goal-directed.  Associations: Coherent and Intact  Thoughts: normal  Mental Status: Orientation: oriented to person, place, time/date and situation Mood & Affect: anxiety Attention Span & Concentration:  Good  Medical Decision Making (Choose Three): Established Problem, Stable/Improving (1), Review of Last Therapy Session (1) and Review of Medication Regimen & Side Effects (2)  Assessment: Axis I:  Depressive disorder NOS, posttraumatic stress disorder  Axis II:  Deferred  Axis III:  Patient Active Problem List   Diagnosis Date Noted  . TIA (transient ischemic attack) 09/23/2012  . Diabetic osteomyelitis 08/29/2012  . Diabetes type 1, uncontrolled 08/29/2012  . Anxiety 08/24/2011  . Depression 08/24/2011    Axis IV:  Mild to moderate  Axis V:  60-65   Plan:  I will continue Cymbalta 90 mg daily and Klonopin 0.5 mg at bedtime.  At this time patient does not have any side effects of the medication.  Recommend to call us back if she is any question or concern if he feel worsening of the symptom.  I will see her again in 2-3 months.  ARFEEN,SYED T., MD 11/21/2012

## 2012-12-13 ENCOUNTER — Encounter (HOSPITAL_BASED_OUTPATIENT_CLINIC_OR_DEPARTMENT_OTHER): Payer: Medicaid Other | Attending: General Surgery

## 2012-12-13 DIAGNOSIS — E1169 Type 2 diabetes mellitus with other specified complication: Secondary | ICD-10-CM | POA: Insufficient documentation

## 2012-12-13 DIAGNOSIS — L97509 Non-pressure chronic ulcer of other part of unspecified foot with unspecified severity: Secondary | ICD-10-CM | POA: Insufficient documentation

## 2012-12-26 ENCOUNTER — Other Ambulatory Visit: Payer: Self-pay | Admitting: *Deleted

## 2013-01-16 ENCOUNTER — Ambulatory Visit: Payer: Self-pay | Admitting: Neurology

## 2013-01-20 ENCOUNTER — Other Ambulatory Visit (HOSPITAL_COMMUNITY): Payer: Self-pay | Admitting: Psychiatry

## 2013-01-20 DIAGNOSIS — F411 Generalized anxiety disorder: Secondary | ICD-10-CM

## 2013-01-23 ENCOUNTER — Ambulatory Visit (HOSPITAL_COMMUNITY): Payer: Self-pay | Admitting: Psychiatry

## 2013-01-27 ENCOUNTER — Ambulatory Visit (HOSPITAL_COMMUNITY): Payer: Self-pay | Admitting: Psychiatry

## 2013-02-07 ENCOUNTER — Telehealth: Payer: Self-pay | Admitting: Licensed Clinical Social Worker

## 2013-02-07 NOTE — Telephone Encounter (Signed)
Patient's wound has gotten worse and the wound care center is recommended casting per the patient. She will be going to see them next Tues 8/5, she would like to see Dr. Johnnye Sima before then so he can see the wound. I did not see any availability, can this patient be worked in on your schedule before next Tuesday? I did not see anything available for the clinic providers.

## 2013-02-08 ENCOUNTER — Encounter (HOSPITAL_COMMUNITY): Payer: Self-pay | Admitting: Psychiatry

## 2013-02-08 ENCOUNTER — Ambulatory Visit (INDEPENDENT_AMBULATORY_CARE_PROVIDER_SITE_OTHER): Payer: Federal, State, Local not specified - Other | Admitting: Psychiatry

## 2013-02-08 VITALS — BP 141/83 | HR 100 | Ht 63.0 in | Wt 166.2 lb

## 2013-02-08 DIAGNOSIS — F329 Major depressive disorder, single episode, unspecified: Secondary | ICD-10-CM

## 2013-02-08 DIAGNOSIS — F419 Anxiety disorder, unspecified: Secondary | ICD-10-CM

## 2013-02-08 DIAGNOSIS — F411 Generalized anxiety disorder: Secondary | ICD-10-CM

## 2013-02-08 DIAGNOSIS — F4310 Post-traumatic stress disorder, unspecified: Secondary | ICD-10-CM

## 2013-02-08 DIAGNOSIS — F3289 Other specified depressive episodes: Secondary | ICD-10-CM

## 2013-02-08 MED ORDER — CLONAZEPAM 0.5 MG PO TABS: ORAL_TABLET | ORAL | Status: DC

## 2013-02-08 MED ORDER — DULOXETINE HCL 30 MG PO CPEP: ORAL_CAPSULE | ORAL | Status: DC

## 2013-02-08 NOTE — Telephone Encounter (Signed)
Monday or wednesday

## 2013-02-08 NOTE — Progress Notes (Signed)
[facility] [phone] Progress Note  Suzanne Allen [id number] 52 y.o.  02/08/2013 4:44 PM  Chief Complaint:  I am under stress.    History of Present Illness: Patient is 52 year old Caucasian widowed female who came for her followup appointment.  Patient 52 year old Caucasian widowed female who came for her followup appointment. Patient endorse increased stress in the past few weeks.  She admitted poor sleep irritability and anger.  She is very concerned about her foot .  She is scheduled to see her physician on Friday and may need amputation .  She is walking with pain.  She has a cast on her foot.  She has osteoarthritis which is not improving.  She admitted recently increased anxiety and crying spells.  She is not sleeping.  She admitted dealing with her elderly father and handicap daughter and sometimes overwhelming.  She endorsed social isolation and some time feeling of hopelessness and worthlessness.  She denies any active suicidal thoughts but admitted some time that she wished she could go somewhere where no one can find out .  Patient denies any hallucination or any paranoia.  She denies any recent panic attack but admitted more irritable and frustrated.  She is not drinking or using and a little substance.  She has multiple physical health issues .  She is compliant with Cymbalta and Klonopin.  Suicidal Ideation: No Plan Formed: No Patient has means to carry out plan: No  Homicidal Ideation: No Plan Formed: No Patient has means to carry out plan: No  Review of Systems: Psychiatric: Agitation: Yes Hallucination: No Depressed Mood: Yes Insomnia: Yes Hypersomnia: No Altered Concentration: No Feels Worthless: Yes Grandiose Ideas: No Belief In Special Powers: No New/Increased Substance Abuse: No Compulsions: No  Neurologic: Headache: No Seizure: No Paresthesias: Diabetic neuropathy in both feet  Medical History:  Patient has history of hypothyroidism, insulin-dependent diabetes, diabetic neuropathy, hypertension macular degeneration and recent TIA.   Her primary care physician is Dr. Veva Holes.  Family history. Patient endorsed mother has significant depression she died in 2000-10-22.  Alcohol and substance use history. Patient denies any history of alcohol or illegal substance use.  Psychosocial history. Patient is born and raised in [place].  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  She has one daughter who lives in [place].  Patient has no contact with her.  Patient is getting royalties from her husband's pattern who was very creative and involved in computer IT business.  Her husband committed suicide in October 23, 1994.  Outpatient Encounter Prescriptions as of 02/08/2013  Medication Sig Dispense Refill  . aspirin 325 MG tablet Take 1 tablet (325 mg total) by mouth daily.  90 tablet  2  . clonazePAM (KLONOPIN) 0.5 MG tablet Take 1-2 daily as needed  45 tablet  1  . DULoxetine (CYMBALTA) 30 MG capsule TAKE 3 CAPSULES BY MOUTH EVERY DAY  90 capsule  1  . HUMALOG 100 UNIT/ML injection Inject 40 Units into the skin 4 (four) times daily.       . insulin glargine (LANTUS) 100 UNIT/ML injection Inject 40 Units into the skin 2 (two) times daily.       . insulin lispro (HUMALOG) 100 UNIT/ML injection Inject 40 Units into the skin 30 (thirty) minutes before meals.      Marland Kitchen levothyroxine (SYNTHROID, LEVOTHROID) 137 MCG tablet Take 137 mcg by mouth daily.        Marland Kitchen lisinopril-hydrochlorothiazide (PRINZIDE,ZESTORETIC) 20-25 MG per tablet Take 1 tablet by mouth daily.       Marland Kitchen  simvastatin (ZOCOR) 20 MG tablet Take 20 mg by mouth daily.       . [DISCONTINUED] clonazePAM (KLONOPIN) 0.5 MG tablet Take 1 tablet (0.5 mg total) by mouth at bedtime.  30 tablet  1  . [DISCONTINUED] DULoxetine (CYMBALTA) 30 MG capsule TAKE 3 CAPSULES BY MOUTH EVERY DAY  90 capsule  0   No facility-administered encounter medications on file as of 02/08/2013.    Past Psychiatric History/Hospitalization(s): Patient has history of depression and  posttraumatic stress disorder for past 16 years. Patient husband committed suicide in 1996. She witnessed when she saw his body hanging. Patient has taken in the past Effexor Prozac and Celexa. Patient endorse history of severe flashback, hearing her husband voice and nightmares. She denies any history of previous suicidal attempt. Patient denies any previous inpatient psychiatric treatment.   Anxiety: Yes Bipolar Disorder: No Depression: Yes Mania: No Psychosis: No Schizophrenia: No Personality Disorder: No Hospitalization for psychiatric illness: No History of Electroconvulsive Shock Therapy: No Prior Suicide Attempts: No  Physical Exam: Constitutional:  BP 141/83  Pulse 100  Ht 5\' 3"  (1.6 m)  Wt 166 lb 3.2 oz (75.388 kg)  BMI 29.45 kg/m2  General Appearance: Patient has difficulty walking.  She is wearing cast.  She maintained fair eye contact.  She is easily tearful.  Musculoskeletal: Strength & Muscle Tone: Pain in her foot Gait & Station: Difficulty in walking due to pain Patient leans: N/A  Psychiatric: Speech (describe rate, volume, coherence, spontaneity, and abnormalities if any):  Clear and coherent.  Thought Process (describe rate, content, abstract reasoning, and computation):  Organized logical and goal-directed.  Associations: Coherent and Intact  Thoughts: Depressed and feeling overwhelmed with her physical symptoms and medical problems.  However there were no paranoia or delusions obsession.  Mental Status: Orientation: oriented to person, place, time/date and situation Mood & Affect: anxiety Attention Span & Concentration:  Good  Medical Decision Making (Choose Three): Established Problem, Stable/Improving (1), New problem, with additional work up planned, Established Problem, Worsening (2), Review of Last Therapy Session (1), Review of Medication Regimen & Side Effects (2) and Review of New Medication or Change in Dosage (2)  Assessment: Axis I:   Depressive disorder NOS, posttraumatic stress disorder  Axis II:  Deferred  Axis III:  Patient Active Problem List   Diagnosis Date Noted  . TIA (transient ischemic attack) 09/23/2012  . Diabetic osteomyelitis 08/29/2012  . Diabetes type 1, uncontrolled 08/29/2012  . Anxiety 08/24/2011  . Depression 08/24/2011    Axis IV:  Mild to moderate  Axis V:  60-65   Plan:  I had a long discussion with the patient.  I do believe patient is feeling more depressed than usual.  She is feeling overwhelmed.  She has seen therapists in the past however she stopped because she was feeling better.  I recommend to see therapist again.  Patient has multiple issues which are medical and psychosocial.  I also recommend to try Klonopin 1-2 tablets as needed.  I will continue Cymbalta 90 mg daily.  I discussed the risks and benefits of medication.  I recommend to call us back if she is any question or concern on if she feels worsening of the symptoms. Time spent 25 minutes.  More than 50% of the time spent in psychoeducation, counseling and coordination of care.  Discuss safety plan that anytime having active suicidal thoughts or homicidal thoughts then patient need to call 911 or go to the local emergency room.  We  will schedule appointment with therapist and I will see her again in 2 months  Lajoyce Tamura T., MD 02/08/2013

## 2013-02-10 ENCOUNTER — Encounter (HOSPITAL_BASED_OUTPATIENT_CLINIC_OR_DEPARTMENT_OTHER): Payer: Medicaid Other | Attending: General Surgery

## 2013-02-10 DIAGNOSIS — L84 Corns and callosities: Secondary | ICD-10-CM | POA: Insufficient documentation

## 2013-02-10 DIAGNOSIS — L97509 Non-pressure chronic ulcer of other part of unspecified foot with unspecified severity: Secondary | ICD-10-CM | POA: Insufficient documentation

## 2013-02-10 DIAGNOSIS — E1169 Type 2 diabetes mellitus with other specified complication: Secondary | ICD-10-CM | POA: Insufficient documentation

## 2013-02-13 ENCOUNTER — Ambulatory Visit (INDEPENDENT_AMBULATORY_CARE_PROVIDER_SITE_OTHER): Payer: Medicaid Other | Admitting: Infectious Diseases

## 2013-02-13 ENCOUNTER — Encounter: Payer: Self-pay | Admitting: Infectious Diseases

## 2013-02-13 VITALS — BP 120/78 | HR 106 | Temp 99.0°F | Ht 63.0 in | Wt 165.0 lb

## 2013-02-13 DIAGNOSIS — M9080 Osteopathy in diseases classified elsewhere, unspecified site: Secondary | ICD-10-CM

## 2013-02-13 DIAGNOSIS — M869 Osteomyelitis, unspecified: Secondary | ICD-10-CM

## 2013-02-13 DIAGNOSIS — E1169 Type 2 diabetes mellitus with other specified complication: Secondary | ICD-10-CM

## 2013-02-13 MED ORDER — LEVOFLOXACIN 500 MG PO TABS: 500.0000 mg | ORAL_TABLET | Freq: Every day | ORAL | Status: DC

## 2013-02-13 MED ORDER — AMOXICILLIN-POT CLAVULANATE 875-125 MG PO TABS: 1.0000 | ORAL_TABLET | Freq: Two times a day (BID) | ORAL | Status: DC

## 2013-02-13 NOTE — Progress Notes (Signed)
  Subjective:    Patient ID: Suzanne Allen, female    DOB: 1960-12-08, 52 y.o.   MRN: [id number]  HPI 52 yo F with hx of anxiety, DM1 (since 52 yo) and 1 yr of wound on R great toe. Has had repeated debridements of her toe.  Had amnio-derma-graft (June 2013) and was in cast (for 6 weeks) and it healed. After 1 week had cracked again and has since ulcerated and "turned into a bone infection".  MRI on 08-11-12 showed: 1. Right great toe cellulitis. No evidence of abscess. 2. Abnormal marrow edema and enhancement throughout the distal phalanx of the great toe with suspected early cortical erosion at  the tuft, suspicious for oteomyelitis. The proximal phalanx and  first metatarsal appear normal.  Started on vanco/oral cipro on 08-29-12 with a plan for 6 weeks of therapy  On 09-22-12 was changed to dapto/cipro due to concerns about drug rash. Completed anbx 10-12-12. Her ESR has gone from 45 to 50. Her foot feels the same- feels tight. Has pain to her mid-ankle. Jabbing. Has a hx of neuropathy- typically does not feel anything in her foot. Occas temps to 101. Wound is unchanged. 3 days ago she underwent repeat local debridement of her toe. No d/c from wound.  FSGs have been 122- 422.   Review of Systems Has had ophtho this year ( has had eye surgery)    Objective:   Physical Exam  Constitutional: She appears well-developed and well-nourished.  Eyes: EOM are normal.  Musculoskeletal:       Feet:  Neurological: A sensory deficit is present.  No light touch in R foot.           Assessment & Plan:

## 2013-02-13 NOTE — Assessment & Plan Note (Signed)
I gave her several options- 1) watchful waiting 2) course of oral anbx. 3) repeat MRI and then decide about IV anbx vs oral.  She chooses the 2nd option. Will see her back in 6 weeks to re-assess. She will continue to f/u with the wound care center. I am not concerned about the 5 point change in her ESR (probably within the error of the test). We discussed that empiric oral anbx are "a shot in the dark" as we do not have Cx data for her.  She needs diabetic control.

## 2013-02-14 ENCOUNTER — Ambulatory Visit (HOSPITAL_COMMUNITY): Payer: Self-pay | Admitting: Marriage and Family Therapist

## 2013-03-14 ENCOUNTER — Encounter (HOSPITAL_BASED_OUTPATIENT_CLINIC_OR_DEPARTMENT_OTHER): Payer: Medicaid Other | Attending: General Surgery

## 2013-03-14 DIAGNOSIS — L84 Corns and callosities: Secondary | ICD-10-CM | POA: Insufficient documentation

## 2013-03-14 DIAGNOSIS — L97509 Non-pressure chronic ulcer of other part of unspecified foot with unspecified severity: Secondary | ICD-10-CM | POA: Insufficient documentation

## 2013-03-14 DIAGNOSIS — E1169 Type 2 diabetes mellitus with other specified complication: Secondary | ICD-10-CM | POA: Insufficient documentation

## 2013-03-28 ENCOUNTER — Other Ambulatory Visit (HOSPITAL_BASED_OUTPATIENT_CLINIC_OR_DEPARTMENT_OTHER): Payer: Self-pay | Admitting: General Surgery

## 2013-03-28 DIAGNOSIS — M869 Osteomyelitis, unspecified: Secondary | ICD-10-CM

## 2013-04-03 ENCOUNTER — Ambulatory Visit (HOSPITAL_COMMUNITY)
Admission: RE | Admit: 2013-04-03 | Discharge: 2013-04-03 | Disposition: A | Discharge status: Home / Self Care | Payer: Medicaid Other | Source: Ambulatory Visit | Attending: General Surgery | Admitting: General Surgery

## 2013-04-03 DIAGNOSIS — M869 Osteomyelitis, unspecified: Secondary | ICD-10-CM

## 2013-04-04 ENCOUNTER — Other Ambulatory Visit (HOSPITAL_BASED_OUTPATIENT_CLINIC_OR_DEPARTMENT_OTHER): Payer: Self-pay | Admitting: General Surgery

## 2013-04-04 ENCOUNTER — Ambulatory Visit (HOSPITAL_COMMUNITY)
Admission: RE | Admit: 2013-04-04 | Discharge: 2013-04-04 | Disposition: A | Discharge status: Home / Self Care | Payer: Medicaid Other | Source: Ambulatory Visit | Attending: General Surgery | Admitting: General Surgery

## 2013-04-04 DIAGNOSIS — R609 Edema, unspecified: Secondary | ICD-10-CM | POA: Insufficient documentation

## 2013-04-04 DIAGNOSIS — M79674 Pain in right toe(s): Secondary | ICD-10-CM

## 2013-04-04 DIAGNOSIS — R52 Pain, unspecified: Secondary | ICD-10-CM | POA: Insufficient documentation

## 2013-04-04 DIAGNOSIS — E119 Type 2 diabetes mellitus without complications: Secondary | ICD-10-CM | POA: Insufficient documentation

## 2013-04-07 ENCOUNTER — Ambulatory Visit (HOSPITAL_COMMUNITY)
Admission: RE | Admit: 2013-04-07 | Discharge: 2013-04-07 | Disposition: A | Discharge status: Home / Self Care | Payer: Medicaid Other | Source: Ambulatory Visit | Attending: General Surgery | Admitting: General Surgery

## 2013-04-07 DIAGNOSIS — L02619 Cutaneous abscess of unspecified foot: Secondary | ICD-10-CM | POA: Insufficient documentation

## 2013-04-07 DIAGNOSIS — L97509 Non-pressure chronic ulcer of other part of unspecified foot with unspecified severity: Secondary | ICD-10-CM | POA: Insufficient documentation

## 2013-04-07 DIAGNOSIS — L03039 Cellulitis of unspecified toe: Secondary | ICD-10-CM | POA: Insufficient documentation

## 2013-04-07 MED ORDER — GADOBENATE DIMEGLUMINE 529 MG/ML IV SOLN
14.0000 mL | Freq: Once | INTRAVENOUS | Status: AC | PRN
  Administered 2013-04-07: 7 mL via INTRAVENOUS

## 2013-04-11 ENCOUNTER — Encounter (HOSPITAL_COMMUNITY): Payer: Self-pay | Admitting: Psychiatry

## 2013-04-11 ENCOUNTER — Ambulatory Visit (INDEPENDENT_AMBULATORY_CARE_PROVIDER_SITE_OTHER): Payer: Medicaid Other | Admitting: Psychiatry

## 2013-04-11 VITALS — BP 109/67 | HR 96 | Ht 63.0 in

## 2013-04-11 DIAGNOSIS — F419 Anxiety disorder, unspecified: Secondary | ICD-10-CM

## 2013-04-11 DIAGNOSIS — F4310 Post-traumatic stress disorder, unspecified: Secondary | ICD-10-CM

## 2013-04-11 DIAGNOSIS — F329 Major depressive disorder, single episode, unspecified: Secondary | ICD-10-CM

## 2013-04-11 DIAGNOSIS — F411 Generalized anxiety disorder: Secondary | ICD-10-CM

## 2013-04-11 DIAGNOSIS — F3289 Other specified depressive episodes: Secondary | ICD-10-CM

## 2013-04-11 MED ORDER — DULOXETINE HCL 30 MG PO CPEP: ORAL_CAPSULE | ORAL | Status: DC

## 2013-04-11 MED ORDER — CLONAZEPAM 0.5 MG PO TABS: ORAL_TABLET | ORAL | Status: DC
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
## 2013-04-18 ENCOUNTER — Encounter (HOSPITAL_BASED_OUTPATIENT_CLINIC_OR_DEPARTMENT_OTHER): Payer: Medicaid Other | Attending: General Surgery

## 2013-04-18 DIAGNOSIS — E1142 Type 2 diabetes mellitus with diabetic polyneuropathy: Secondary | ICD-10-CM | POA: Insufficient documentation

## 2013-04-18 DIAGNOSIS — E1149 Type 2 diabetes mellitus with other diabetic neurological complication: Secondary | ICD-10-CM | POA: Insufficient documentation

## 2013-04-18 DIAGNOSIS — L97509 Non-pressure chronic ulcer of other part of unspecified foot with unspecified severity: Secondary | ICD-10-CM | POA: Insufficient documentation

## 2013-04-18 DIAGNOSIS — E1169 Type 2 diabetes mellitus with other specified complication: Secondary | ICD-10-CM | POA: Insufficient documentation

## 2013-05-10 NOTE — Progress Notes (Signed)
[facility]  NAME:  LU, [name] NO.:  [id number]  MEDICAL RECORD NO.:  [id number]      DATE OF BIRTH:  11-Jun-1961  PHYSICIAN:  Elesa Hacker, M.D.              VISIT DATE:                                  OFFICE VISIT   Suzanne Allen is a 52 year old female with diabetes and significant neuropathy.  We have been treating an ulcer on the great toe since January 2014.  An MRI in March showed evidence of osteo in the distal phalanx and this was treated with intravenous antibiotics.  Repeat MRI showed that this finding was no longer present.  The patient has had multiple episodes of application of Apligraf and Dermagraft.  There has been very slow and poor evidence of healing.  The wound is essentially the same size as it was 7 months ago.  Attempts at offloading have been unsuccessful.  We had consultation with diabetes specialist to improve control and with infectious disease specialist and we believe that hyperbaric oxygen treatment is clearly indicated in this setting.     Elesa Hacker, M.D.     RA/MEDQ  D:  05/09/2013  T:  05/10/2013  Job:  [id number]

## 2013-05-16 ENCOUNTER — Ambulatory Visit (INDEPENDENT_AMBULATORY_CARE_PROVIDER_SITE_OTHER): Payer: Federal, State, Local not specified - Other | Admitting: Licensed Clinical Social Worker

## 2013-05-16 ENCOUNTER — Encounter (HOSPITAL_BASED_OUTPATIENT_CLINIC_OR_DEPARTMENT_OTHER): Payer: Medicaid Other | Attending: General Surgery

## 2013-05-16 ENCOUNTER — Encounter (HOSPITAL_COMMUNITY): Payer: Self-pay | Admitting: Licensed Clinical Social Worker

## 2013-05-16 DIAGNOSIS — Z794 Long term (current) use of insulin: Secondary | ICD-10-CM | POA: Insufficient documentation

## 2013-05-16 DIAGNOSIS — J309 Allergic rhinitis, unspecified: Secondary | ICD-10-CM | POA: Insufficient documentation

## 2013-05-16 DIAGNOSIS — E1142 Type 2 diabetes mellitus with diabetic polyneuropathy: Secondary | ICD-10-CM | POA: Insufficient documentation

## 2013-05-16 DIAGNOSIS — E042 Nontoxic multinodular goiter: Secondary | ICD-10-CM | POA: Insufficient documentation

## 2013-05-16 DIAGNOSIS — I10 Essential (primary) hypertension: Secondary | ICD-10-CM | POA: Insufficient documentation

## 2013-05-16 DIAGNOSIS — E1069 Type 1 diabetes mellitus with other specified complication: Secondary | ICD-10-CM | POA: Insufficient documentation

## 2013-05-16 DIAGNOSIS — F32A Depression, unspecified: Secondary | ICD-10-CM

## 2013-05-16 DIAGNOSIS — E785 Hyperlipidemia, unspecified: Secondary | ICD-10-CM | POA: Insufficient documentation

## 2013-05-16 DIAGNOSIS — F3289 Other specified depressive episodes: Secondary | ICD-10-CM

## 2013-05-16 DIAGNOSIS — L97509 Non-pressure chronic ulcer of other part of unspecified foot with unspecified severity: Secondary | ICD-10-CM | POA: Insufficient documentation

## 2013-05-16 DIAGNOSIS — F329 Major depressive disorder, single episode, unspecified: Secondary | ICD-10-CM

## 2013-05-16 DIAGNOSIS — E1049 Type 1 diabetes mellitus with other diabetic neurological complication: Secondary | ICD-10-CM | POA: Insufficient documentation

## 2013-05-16 DIAGNOSIS — E039 Hypothyroidism, unspecified: Secondary | ICD-10-CM | POA: Insufficient documentation

## 2013-05-16 DIAGNOSIS — Z79899 Other long term (current) drug therapy: Secondary | ICD-10-CM | POA: Insufficient documentation
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
## 2013-05-17 NOTE — Progress Notes (Signed)
[facility]  NAME:  [name], Suzanne NO.:  [id number]  MEDICAL RECORD NO.:  [id number]      DATE OF BIRTH:  08/27/1960  PHYSICIAN:  Elesa Hacker, M.D.         VISIT DATE:  05/16/2013                                  OFFICE VISIT   CHIEF COMPLAINT:  Sore, right great toe.  HISTORY OF PRESENT ILLNESS:  This 52 year old female has had a sore on the plantar surface of her right great toe for at least 1 year.  She has been under our care for more than 3 months.  Current treatment include routine wound care with multiple debridements, multiple applications of Dermagraft, multiple applications of Endoform.  Progress of the wound has not changed very much at all.  We are unable to use an easy cast or walking cast because the patient has many responsibilities taking care of a sick daughter.  PAST MEDICAL HISTORY:  Diabetes type 1, multinodular goiter, hypothyroidism, hypertension, hyperlipidemia, allergic rhinitis, diabetic neuropathy, and retinopathy.  PAST SURGICAL HISTORY:  Thyroidectomy.  MEDICATIONS:  Keflex 500 twice a day, Klonopin, Cymbalta, Lantus, Humalog, Synthroid, lisinopril, simvastatin.  SOCIAL HISTORY:  No cigarettes and no alcohol.  REVIEW OF SYSTEMS:  No recent weight gain or loss.  No fevers, chills, or sweating spells.  No cough, chest pain, or pleuritic symptoms.  PHYSICAL EXAMINATION:  VITAL SIGNS:  Temperature 98, pulse 80, respirations 16, blood pressure 115/80, blood glucose is 138. GENERAL APPEARANCE:  A well developed, well nourished, in no distress. EYES, EARS, NOSE, THROAT:  Negative. CHEST:  Clear. HEART:  Regular rhythm. ABDOMEN:  Benign. LOWER EXTREMITIES:  Reveals good peripheral pulses.  There is an ulcer on the plantar surface of the great toe, which measures 1.0 x 1.0.  ASSESSMENT:  Suzanne Allen is a good candidate for hyperbaric oxygen therapy, with a long-standing diabetic foot ulcer, which has  not responded well to routine treatment.  She is under care of an endocrinologist and now seems to be in good glucose control.  DIAGNOSTIC IMPRESSION:  Diabetic foot ulcer, [place] 3.  Diphtheroid and MD on November.  Full code.     Elesa Hacker, M.D.     RA/MEDQ  D:  05/16/2013  T:  05/17/2013  Job:  [id number]

## 2013-05-29 ENCOUNTER — Encounter (INDEPENDENT_AMBULATORY_CARE_PROVIDER_SITE_OTHER): Payer: Self-pay

## 2013-05-29 ENCOUNTER — Ambulatory Visit (INDEPENDENT_AMBULATORY_CARE_PROVIDER_SITE_OTHER): Payer: Federal, State, Local not specified - Other | Admitting: Psychiatry

## 2013-05-29 ENCOUNTER — Encounter (HOSPITAL_COMMUNITY): Payer: Self-pay | Admitting: Psychiatry

## 2013-05-29 VITALS — BP 131/87 | HR 100 | Ht 63.0 in | Wt 159.0 lb

## 2013-05-29 DIAGNOSIS — F4310 Post-traumatic stress disorder, unspecified: Secondary | ICD-10-CM

## 2013-05-29 DIAGNOSIS — F3289 Other specified depressive episodes: Secondary | ICD-10-CM

## 2013-05-29 DIAGNOSIS — F329 Major depressive disorder, single episode, unspecified: Secondary | ICD-10-CM

## 2013-05-29 DIAGNOSIS — F411 Generalized anxiety disorder: Secondary | ICD-10-CM

## 2013-05-29 DIAGNOSIS — F419 Anxiety disorder, unspecified: Secondary | ICD-10-CM

## 2013-05-29 MED ORDER — DULOXETINE HCL 30 MG PO CPEP: ORAL_CAPSULE | ORAL | Status: DC

## 2013-05-29 MED ORDER — CLONAZEPAM 0.5 MG PO TABS: ORAL_TABLET | ORAL | Status: DC

## 2013-05-29 NOTE — Progress Notes (Signed)
[facility] Progress Note  Suzanne Allen [id number] 52 y.o.  05/29/2013 2:20 PM  Chief Complaint:  Medication management and followup.    History of Present Illness: Suzanne Allen came for her followup appointment.  She exhibited increasing anxiety and nervousness .  She endorse living with the father is stressful.  She endorse some time having panic attack and recently her blood sugars was increased.  Her primary care physician increased the insulin.  She endorse mornings are difficult .  She tries seeing Suzanne Allen for counseling however realize that she is not a good candidate for counseling.  She cancel all future appointments .  She wanted to continue her medication .  She denies any side effects of medication.  She is taking Cymbalta 90 mg per year she denies any tremors or any shakes.  She denied any suicidal thoughts or homicidal thoughts.  She is concerned about her physical health especially osteomyelitis and macular degeneration.  Decision about that the patient is still up in the air.  She is on antibiotics.  She likes the Klonopin which is helping her anxiety and sleep.  She is not drinking or using any illegal substance.  Suicidal Ideation: No Plan Formed: No Patient has means to carry out plan: No  Homicidal Ideation: No Plan Formed: No Patient has means to carry out plan: No  Review of Systems: Psychiatric: Agitation: No Hallucination: No Depressed Mood: No Insomnia: Yes Hypersomnia: No Altered Concentration: No Feels Worthless: No Grandiose Ideas: No Belief In Special Powers: No New/Increased Substance Abuse: No Compulsions: No  Neurologic: Headache: No Seizure: No Paresthesias: Diabetic neuropathy in both feet  Medical History:  Patient has history of hypothyroidism, insulin-dependent diabetes, diabetic neuropathy, hypertension macular degeneration and recent TIA.  Her primary care physician is Dr. Veva Holes.  Family history. Patient endorsed mother  has significant depression she died in Oct 05, 2000.  Alcohol and substance use history. Patient denies any history of alcohol or illegal substance use.  Psychosocial history. Patient is born and raised in [place].  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  She has one daughter who lives in [place].  Patient has no contact with her.  Patient is getting royalties from her husband's pattern who was very creative and involved in computer IT business.  Her husband committed suicide in 10/06/1994.  Outpatient Encounter Prescriptions as of 05/29/2013  Medication Sig  . aspirin 325 MG tablet Take 1 tablet (325 mg total) by mouth daily.  . clonazePAM (KLONOPIN) 0.5 MG tablet Take 1 tab twice daily  . DULoxetine (CYMBALTA) 30 MG capsule TAKE 3 CAPSULES BY MOUTH EVERY DAY  . insulin glargine (LANTUS) 100 UNIT/ML injection Inject 0.6 mLs (60 Units total) into the skin 2 (two) times daily.  . insulin lispro (HUMALOG) 100 UNIT/ML injection Inject 0.4 mLs (40 Units total) into the skin 30 (thirty) minutes before meals.  Marland Kitchen levothyroxine (SYNTHROID, LEVOTHROID) 137 MCG tablet Take 137 mcg by mouth daily.    Marland Kitchen lisinopril-hydrochlorothiazide (PRINZIDE,ZESTORETIC) 20-25 MG per tablet Take 1 tablet by mouth daily.   . [DISCONTINUED] clonazePAM (KLONOPIN) 0.5 MG tablet Take 1-2 daily as needed  . [DISCONTINUED] DULoxetine (CYMBALTA) 30 MG capsule TAKE 3 CAPSULES BY MOUTH EVERY DAY  . [DISCONTINUED] amoxicillin-clavulanate (AUGMENTIN) 875-125 MG per tablet Take 1 tablet by mouth 2 (two) times daily.  . [DISCONTINUED] HUMALOG 100 UNIT/ML injection Inject 40 Units into the skin 4 (four) times daily.   . [DISCONTINUED] insulin  glargine (LANTUS) 100 UNIT/ML injection Inject 40 Units into the skin 2 (two) times daily.   . [DISCONTINUED] insulin lispro (HUMALOG) 100 UNIT/ML injection Inject 40 Units into the skin 30 (thirty) minutes before meals.  . [DISCONTINUED] levofloxacin (LEVAQUIN) 500 MG  tablet Take 1 tablet (500 mg total) by mouth daily.  . [DISCONTINUED] simvastatin (ZOCOR) 20 MG tablet Take 20 mg by mouth daily.     Past Psychiatric History/Hospitalization(s): Patient has history of depression and posttraumatic stress disorder for past 16 years. Patient husband committed suicide in 1996. She witnessed when she saw his body hanging. Patient has taken in the past Effexor Prozac and Celexa. Patient endorse history of severe flashback, hearing her husband voice and nightmares. She denies any history of previous suicidal attempt. Patient denies any previous inpatient psychiatric treatment.   Anxiety: Yes Bipolar Disorder: No Depression: Yes Mania: No Psychosis: No Schizophrenia: No Personality Disorder: No Hospitalization for psychiatric illness: No History of Electroconvulsive Shock Therapy: No Prior Suicide Attempts: No  Physical Exam: Constitutional:  BP 131/87  Pulse 100  Ht 5\' 3"  (1.6 m)  Wt 159 lb (72.122 kg)  BMI 28.17 kg/m2  General Appearance: Patient has difficulty walking.  She is wearing cast.  She maintained fair eye contact.  She is easily tearful.  Musculoskeletal: Strength & Muscle Tone: Pain in her foot Gait & Station: Difficulty in walking due to pain Patient leans: N/A  Psychiatric: Speech (describe rate, volume, coherence, spontaneity, and abnormalities if any):  Clear and coherent.  Thought Process (describe rate, content, abstract reasoning, and computation):  Organized logical and goal-directed.  Associations: Coherent and Intact  Thoughts: Feeling overwhelmed  Mental Status: Orientation: oriented to person, place, time/date and situation Mood & Affect: anxiety Attention Span & Concentration:  Good  Medical Decision Making (Choose Three): Established Problem, Stable/Improving (1), Review of Psycho-Social Stressors (1), Review of Last Therapy Session (1), Review of Medication Regimen & Side Effects (2) and Review of New Medication or  Change in Dosage (2)  Assessment: Axis I:  Depressive disorder NOS, posttraumatic stress disorder  Axis II:  Deferred  Axis III:  Patient Active Problem List   Diagnosis Date Noted  . TIA (transient ischemic attack) 09/23/2012  . Diabetic osteomyelitis 08/29/2012  . Diabetes type 1, uncontrolled 08/29/2012  . Anxiety 08/24/2011  . Depression 08/24/2011    Axis IV:  Mild to moderate  Axis V:  60-65   Plan:  I will try Klonopin 0.5 mg twice a day.  Continue Cymbalta 90 mg daily.  Patient does not want any counseling at this time.  She has been very busy with doctor's appointment.  I recommend to call us back otherwise I will see her again in 2-3 months.  Mashonda Broski T., MD 05/29/2013

## 2013-06-13 ENCOUNTER — Encounter (HOSPITAL_BASED_OUTPATIENT_CLINIC_OR_DEPARTMENT_OTHER): Payer: Medicaid Other | Attending: General Surgery

## 2013-06-13 DIAGNOSIS — L97509 Non-pressure chronic ulcer of other part of unspecified foot with unspecified severity: Secondary | ICD-10-CM | POA: Insufficient documentation

## 2013-06-13 DIAGNOSIS — L84 Corns and callosities: Secondary | ICD-10-CM | POA: Insufficient documentation

## 2013-06-13 DIAGNOSIS — E1169 Type 2 diabetes mellitus with other specified complication: Secondary | ICD-10-CM | POA: Insufficient documentation

## 2013-06-21 ENCOUNTER — Ambulatory Visit (HOSPITAL_COMMUNITY): Payer: Self-pay | Admitting: Licensed Clinical Social Worker

## 2013-07-18 ENCOUNTER — Encounter (HOSPITAL_BASED_OUTPATIENT_CLINIC_OR_DEPARTMENT_OTHER): Payer: Medicaid Other | Attending: General Surgery

## 2013-07-18 DIAGNOSIS — L97509 Non-pressure chronic ulcer of other part of unspecified foot with unspecified severity: Secondary | ICD-10-CM | POA: Insufficient documentation

## 2013-07-18 DIAGNOSIS — E1169 Type 2 diabetes mellitus with other specified complication: Secondary | ICD-10-CM | POA: Insufficient documentation

## 2013-07-19 NOTE — Progress Notes (Signed)
[facility]  NAME:  [name], [name] NO.:  [id number]  MEDICAL RECORD NO.:  [id number]      DATE OF BIRTH:  01/26/1961  PHYSICIAN:  Elesa Hacker, M.D.         VISIT DATE:  07/18/2013                                  OFFICE VISIT   [name] is a 53 year old female who has been under treatment for approximately 1 year with a diabetic foot ulcer of plantar surface of her right great toe.  The patient is being treated by her endocrinologist for glycemic control.  She has had episode of off- loading and various biological skin substitutes.  She has had multiple debridements.  She has no osteomyelitis at present.  She has a Hydrographic surveyor 3 diabetic foot ulcer, which has been completely resistant to treatment, and we believe that hyperbaric oxygen is absolutely necessary for her healing.     Elesa Hacker, M.D.     RA/MEDQ  D:  07/18/2013  T:  07/19/2013  Job:  [id number]

## 2013-07-20 ENCOUNTER — Telehealth (HOSPITAL_COMMUNITY): Payer: Self-pay | Admitting: Psychiatry

## 2013-07-20 ENCOUNTER — Other Ambulatory Visit (HOSPITAL_COMMUNITY): Payer: Self-pay | Admitting: Psychiatry

## 2013-07-20 ENCOUNTER — Telehealth (HOSPITAL_COMMUNITY): Payer: Self-pay | Admitting: *Deleted

## 2013-07-20 MED ORDER — CYMBALTA 30 MG PO CPEP: ORAL_CAPSULE | ORAL | Status: DC

## 2013-07-20 NOTE — Telephone Encounter (Signed)
Pharmacy faxed to Request Prior Authorization for Duloxetine DR.  Contacted [employer] @ [phone] as directed. Per representative Julianne - [place] required for generic ONLY If new RX is sent for Brand Name Cymbalta with Dispense As Written, plan will cover medication without Prior Auth.

## 2013-07-20 NOTE — Telephone Encounter (Signed)
Per patient request brand name cymbalta given.

## 2013-07-31 ENCOUNTER — Ambulatory Visit (INDEPENDENT_AMBULATORY_CARE_PROVIDER_SITE_OTHER): Payer: Medicaid Other | Admitting: Psychiatry

## 2013-07-31 ENCOUNTER — Encounter (HOSPITAL_COMMUNITY): Payer: Self-pay | Admitting: Psychiatry

## 2013-07-31 VITALS — BP 115/66 | HR 90 | Ht 63.0 in | Wt 158.8 lb

## 2013-07-31 DIAGNOSIS — F411 Generalized anxiety disorder: Secondary | ICD-10-CM

## 2013-07-31 DIAGNOSIS — F419 Anxiety disorder, unspecified: Secondary | ICD-10-CM

## 2013-07-31 MED ORDER — CYMBALTA 30 MG PO CPEP: ORAL_CAPSULE | ORAL | Status: DC

## 2013-07-31 MED ORDER — CLONAZEPAM 0.5 MG PO TABS: ORAL_TABLET | ORAL | Status: DC

## 2013-07-31 NOTE — Progress Notes (Signed)
[facility] Progress Note  Kanasia Oleksa [id number] 52 y.o.  07/31/2013 1:35 PM  Chief Complaint:  Medication management and followup.    History of Present Illness: Lawson came for her followup appointment.  She states Klonopin 0.5 mg twice a day.  She is less anxious and less nervous.  She sleeping better.  She is frustrated with her physician who is managing her house to like this.  Her physician recommended to see another provider for the management of foster mellitus.  She is getting antibiotic.  She also has eye surgery and she is not very hopeful about her vision.  However her depression and anxiety stable on Cymbalta and Klonopin.  She is taking brand name Cymbalta 90 mg daily she denies any side effects or any other concerns.  She has no tremors or shakes.  Her appetite and sleep is unchanged from the past.  She is not drinking or using any illegal substance.  Suicidal Ideation: No Plan Formed: No Patient has means to carry out plan: No  Homicidal Ideation: No Plan Formed: No Patient has means to carry out plan: No  Review of Systems: Psychiatric: Agitation: No Hallucination: No Depressed Mood: No Insomnia: No Hypersomnia: No Altered Concentration: No Feels Worthless: No Grandiose Ideas: No Belief In Special Powers: No New/Increased Substance Abuse: No Compulsions: No  Neurologic: Headache: No Seizure: No Paresthesias: Diabetic neuropathy in both feet  Medical History:  Patient has history of hypothyroidism, insulin-dependent diabetes, diabetic neuropathy, hypertension macular degeneration and recent TIA.  Her primary care physician is Dr. Veva Holes.   Outpatient Encounter Prescriptions as of 07/31/2013  Medication Sig  . aspirin 325 MG tablet Take 1 tablet (325 mg total) by mouth daily.  . ciprofloxacin (CIPRO) 500 MG tablet 500 mg.  . clonazePAM (KLONOPIN) 0.5 MG tablet Take 1 tab twice daily  . CYMBALTA 30 MG capsule Take 3 capsule daily  .  insulin glargine (LANTUS) 100 UNIT/ML injection Inject 0.6 mLs (60 Units total) into the skin 2 (two) times daily.  . insulin lispro (HUMALOG) 100 UNIT/ML injection Inject 0.4 mLs (40 Units total) into the skin 30 (thirty) minutes before meals.  Marland Kitchen levothyroxine (SYNTHROID, LEVOTHROID) 137 MCG tablet Take 137 mcg by mouth daily.    Marland Kitchen lisinopril-hydrochlorothiazide (PRINZIDE,ZESTORETIC) 20-25 MG per tablet Take 1 tablet by mouth daily.   . [DISCONTINUED] clonazePAM (KLONOPIN) 0.5 MG tablet Take 1 tab twice daily  . [DISCONTINUED] CYMBALTA 30 MG capsule Take 3 capsule daily    Past Psychiatric History/Hospitalization(s): Patient has history of depression and posttraumatic stress disorder for past 16 years. Patient husband committed suicide in 1996. She witnessed when she saw his body hanging. Patient has taken in the past Effexor Prozac and Celexa. Patient endorse history of severe flashback, hearing her husband voice and nightmares. She denies any history of previous suicidal attempt. Patient denies any previous inpatient psychiatric treatment.   Anxiety: Yes Bipolar Disorder: No Depression: Yes Mania: No Psychosis: No Schizophrenia: No Personality Disorder: No Hospitalization for psychiatric illness: No History of Electroconvulsive Shock Therapy: No Prior Suicide Attempts: No  Physical Exam: Constitutional:  BP 115/66  Pulse 90  Ht 5\' 3"  (1.6 m)  Wt 158 lb 12.8 oz (72.031 kg)  BMI 28.14 kg/m2  General Appearance: Patient has difficulty walking.  She is wearing cast.   Musculoskeletal: Strength & Muscle Tone: Pain in her foot Gait & Station: Difficulty in walking due to pain Patient leans: N/A  Psychiatric: Speech (describe rate, volume, coherence, spontaneity, and  abnormalities if any):  Clear and coherent.  Thought Process (describe rate, content, abstract reasoning, and computation):  Organized logical and goal-directed.  Associations: Coherent and Intact  Thoughts:  Feeling overwhelmed  Mental Status: Orientation: oriented to person, place, time/date and situation Mood & Affect: anxiety Attention Span & Concentration:  Good  Medical Decision Making (Choose Three): Established Problem, Stable/Improving (1), Review of Last Therapy Session (1) and Review of New Medication or Change in Dosage (2)  Assessment: Axis I:  Depressive disorder NOS, posttraumatic stress disorder  Axis II:  Deferred  Axis III:  Patient Active Problem List   Diagnosis Date Noted  . TIA (transient ischemic attack) 09/23/2012  . Diabetic osteomyelitis 08/29/2012  . Diabetes type 1, uncontrolled 08/29/2012  . Anxiety 08/24/2011  . Depression 08/24/2011    Axis IV:  Mild to moderate  Axis V:  60-65   Plan:  Continue Klonopin 0.5 mg twice a day and Cymbalta 90 mg daily.  Recommend to call us back if she has any question or any concern.  Follow up in 3 months.   Brendolyn Stockley T., MD 07/31/2013

## 2013-08-02 ENCOUNTER — Ambulatory Visit (HOSPITAL_COMMUNITY): Payer: Self-pay | Admitting: Licensed Clinical Social Worker

## 2013-10-03 DIAGNOSIS — E1140 Type 2 diabetes mellitus with diabetic neuropathy, unspecified: Secondary | ICD-10-CM | POA: Insufficient documentation

## 2013-10-04 ENCOUNTER — Inpatient Hospital Stay (HOSPITAL_COMMUNITY)
Admission: EM | Admit: 2013-10-04 | Discharge: 2013-10-06 | DRG: 440 | Disposition: A | Discharge status: Home / Self Care | Payer: Medicaid Other | Attending: Internal Medicine | Admitting: Internal Medicine

## 2013-10-04 ENCOUNTER — Emergency Department (HOSPITAL_COMMUNITY): Payer: Medicaid Other

## 2013-10-04 ENCOUNTER — Encounter (HOSPITAL_COMMUNITY): Payer: Self-pay | Admitting: Emergency Medicine

## 2013-10-04 DIAGNOSIS — R1013 Epigastric pain: Secondary | ICD-10-CM | POA: Diagnosis present

## 2013-10-04 DIAGNOSIS — F329 Major depressive disorder, single episode, unspecified: Secondary | ICD-10-CM | POA: Diagnosis present

## 2013-10-04 DIAGNOSIS — E7800 Pure hypercholesterolemia, unspecified: Secondary | ICD-10-CM | POA: Diagnosis present

## 2013-10-04 DIAGNOSIS — R1012 Left upper quadrant pain: Secondary | ICD-10-CM | POA: Diagnosis present

## 2013-10-04 DIAGNOSIS — I10 Essential (primary) hypertension: Secondary | ICD-10-CM | POA: Diagnosis present

## 2013-10-04 DIAGNOSIS — E1169 Type 2 diabetes mellitus with other specified complication: Secondary | ICD-10-CM

## 2013-10-04 DIAGNOSIS — F419 Anxiety disorder, unspecified: Secondary | ICD-10-CM | POA: Diagnosis present

## 2013-10-04 DIAGNOSIS — H3530 Unspecified macular degeneration: Secondary | ICD-10-CM | POA: Diagnosis present

## 2013-10-04 DIAGNOSIS — F32A Depression, unspecified: Secondary | ICD-10-CM | POA: Diagnosis present

## 2013-10-04 DIAGNOSIS — D72829 Elevated white blood cell count, unspecified: Secondary | ICD-10-CM

## 2013-10-04 DIAGNOSIS — Z8673 Personal history of transient ischemic attack (TIA), and cerebral infarction without residual deficits: Secondary | ICD-10-CM

## 2013-10-04 DIAGNOSIS — IMO0002 Reserved for concepts with insufficient information to code with codable children: Secondary | ICD-10-CM

## 2013-10-04 DIAGNOSIS — F3289 Other specified depressive episodes: Secondary | ICD-10-CM

## 2013-10-04 DIAGNOSIS — M869 Osteomyelitis, unspecified: Secondary | ICD-10-CM

## 2013-10-04 DIAGNOSIS — F411 Generalized anxiety disorder: Secondary | ICD-10-CM

## 2013-10-04 DIAGNOSIS — E1065 Type 1 diabetes mellitus with hyperglycemia: Secondary | ICD-10-CM

## 2013-10-04 DIAGNOSIS — G459 Transient cerebral ischemic attack, unspecified: Secondary | ICD-10-CM

## 2013-10-04 DIAGNOSIS — F4310 Post-traumatic stress disorder, unspecified: Secondary | ICD-10-CM | POA: Diagnosis present

## 2013-10-04 DIAGNOSIS — K859 Acute pancreatitis without necrosis or infection, unspecified: Principal | ICD-10-CM

## 2013-10-04 DIAGNOSIS — R112 Nausea with vomiting, unspecified: Secondary | ICD-10-CM | POA: Diagnosis present

## 2013-10-04 DIAGNOSIS — E039 Hypothyroidism, unspecified: Secondary | ICD-10-CM | POA: Diagnosis present

## 2013-10-04 DIAGNOSIS — Z794 Long term (current) use of insulin: Secondary | ICD-10-CM

## 2013-10-04 DIAGNOSIS — Z7982 Long term (current) use of aspirin: Secondary | ICD-10-CM

## 2013-10-04 LAB — HEMOGLOBIN A1C
Hgb A1c MFr Bld: 14.5 % — ABNORMAL HIGH (ref <5.7)
Mean Plasma Glucose: 369 mg/dL — ABNORMAL HIGH (ref <117)

## 2013-10-04 LAB — CBC WITH DIFFERENTIAL/PLATELET
Basophils Absolute: 0 10*3/uL (ref 0.0–0.1)
Basophils Relative: 0 % (ref 0–1)
Eosinophils Absolute: 0.2 10*3/uL (ref 0.0–0.7)
Eosinophils Relative: 1 % (ref 0–5)
HCT: 41.6 % (ref 36.0–46.0)
Hemoglobin: 14.8 g/dL (ref 12.0–15.0)
Lymphocytes Relative: 5 % — ABNORMAL LOW (ref 12–46)
Lymphs Abs: 0.9 10*3/uL (ref 0.7–4.0)
MCH: 31 pg (ref 26.0–34.0)
MCHC: 35.6 g/dL (ref 30.0–36.0)
MCV: 87.2 fL (ref 78.0–100.0)
Monocytes Absolute: 0.9 10*3/uL (ref 0.1–1.0)
Monocytes Relative: 5 % (ref 3–12)
Neutro Abs: 15.1 10*3/uL — ABNORMAL HIGH (ref 1.7–7.7)
Neutrophils Relative %: 88 % — ABNORMAL HIGH (ref 43–77)
Platelets: 328 10*3/uL (ref 150–400)
RBC: 4.77 MIL/uL (ref 3.87–5.11)
RDW: 11.9 % (ref 11.5–15.5)
WBC: 17.2 10*3/uL — ABNORMAL HIGH (ref 4.0–10.5)

## 2013-10-04 LAB — URINALYSIS, ROUTINE W REFLEX MICROSCOPIC
Bilirubin Urine: NEGATIVE
Glucose, UA: >1000 mg/dL — AB
Hgb urine dipstick: NEGATIVE
Ketones, ur: >80 mg/dL — AB
Leukocytes, UA: NEGATIVE
Nitrite: NEGATIVE
Protein, ur: NEGATIVE mg/dL
Specific Gravity, Urine: 1.037 — ABNORMAL HIGH (ref 1.005–1.030)
Urobilinogen, UA: 0.2 mg/dL (ref 0.0–1.0)
pH: 5 (ref 5.0–8.0)

## 2013-10-04 LAB — COMPREHENSIVE METABOLIC PANEL
ALT: 22 U/L (ref 0–35)
AST: 17 U/L (ref 0–37)
Albumin: 3.5 g/dL (ref 3.5–5.2)
Alkaline Phosphatase: 117 U/L (ref 39–117)
BUN: 20 mg/dL (ref 6–23)
CO2: 23 mEq/L (ref 19–32)
Calcium: 9.4 mg/dL (ref 8.4–10.5)
Chloride: 90 mEq/L — ABNORMAL LOW (ref 96–112)
Creatinine, Ser: 0.6 mg/dL (ref 0.50–1.10)
GFR calc Af Amer: >90 mL/min (ref >90)
GFR calc non Af Amer: >90 mL/min (ref >90)
Glucose, Bld: 545 mg/dL — ABNORMAL HIGH (ref 70–99)
Potassium: 3.8 mEq/L (ref 3.7–5.3)
Sodium: 131 mEq/L — ABNORMAL LOW (ref 137–147)
Total Bilirubin: 0.6 mg/dL (ref 0.3–1.2)
Total Protein: 7.5 g/dL (ref 6.0–8.3)

## 2013-10-04 LAB — URINE MICROSCOPIC-ADD ON

## 2013-10-04 LAB — LIPASE, BLOOD: Lipase: 597 U/L — ABNORMAL HIGH (ref 11–59)

## 2013-10-04 LAB — GLUCOSE, CAPILLARY
Glucose-Capillary: 301 mg/dL — ABNORMAL HIGH (ref 70–99)
Glucose-Capillary: 331 mg/dL — ABNORMAL HIGH (ref 70–99)
Glucose-Capillary: 218 mg/dL — ABNORMAL HIGH (ref 70–99)

## 2013-10-04 LAB — CBG MONITORING, ED: Glucose-Capillary: 360 mg/dL — ABNORMAL HIGH (ref 70–99)

## 2013-10-04 MED ORDER — PROMETHAZINE HCL 25 MG/ML IJ SOLN
12.5000 mg | Freq: Once | INTRAMUSCULAR | Status: AC
  Administered 2013-10-04: 12.5 mg via INTRAVENOUS
  Filled 2013-10-04: qty 1

## 2013-10-04 MED ORDER — LEVOTHYROXINE SODIUM 137 MCG PO TABS
137.0000 ug | ORAL_TABLET | Freq: Every day | ORAL | Status: DC
  Administered 2013-10-05 – 2013-10-06 (×2): 137 ug via ORAL
  Filled 2013-10-04 (×3): qty 1

## 2013-10-04 MED ORDER — MORPHINE SULFATE 4 MG/ML IJ SOLN
4.0000 mg | Freq: Once | INTRAMUSCULAR | Status: AC
  Administered 2013-10-04: 4 mg via INTRAVENOUS
  Filled 2013-10-04: qty 1

## 2013-10-04 MED ORDER — ENOXAPARIN SODIUM 40 MG/0.4ML ~~LOC~~ SOLN
40.0000 mg | SUBCUTANEOUS | Status: DC
  Administered 2013-10-04 – 2013-10-05 (×2): 40 mg via SUBCUTANEOUS
  Filled 2013-10-04 (×3): qty 0.4

## 2013-10-04 MED ORDER — ONDANSETRON HCL 4 MG/2ML IJ SOLN
4.0000 mg | Freq: Four times a day (QID) | INTRAMUSCULAR | Status: DC | PRN
Start: 2013-10-04 — End: 2013-10-06
  Administered 2013-10-04: 4 mg via INTRAVENOUS
  Filled 2013-10-04: qty 2

## 2013-10-04 MED ORDER — INSULIN ASPART 100 UNIT/ML ~~LOC~~ SOLN
0.0000 [IU] | Freq: Three times a day (TID) | SUBCUTANEOUS | Status: DC
  Administered 2013-10-04: 11 [IU] via SUBCUTANEOUS

## 2013-10-04 MED ORDER — ACETAMINOPHEN 325 MG PO TABS
650.0000 mg | ORAL_TABLET | Freq: Four times a day (QID) | ORAL | Status: DC | PRN
Start: 2013-10-04 — End: 2013-10-06
  Administered 2013-10-05: 650 mg via ORAL
  Filled 2013-10-04: qty 2

## 2013-10-04 MED ORDER — INSULIN ASPART 100 UNIT/ML ~~LOC~~ SOLN
10.0000 [IU] | Freq: Once | SUBCUTANEOUS | Status: AC
  Administered 2013-10-04: 10 [IU] via SUBCUTANEOUS
  Filled 2013-10-04: qty 1

## 2013-10-04 MED ORDER — PROMETHAZINE HCL 25 MG/ML IJ SOLN: 12.5000 mg | Freq: Four times a day (QID) | INTRAMUSCULAR | Status: DC | PRN

## 2013-10-04 MED ORDER — ACETAMINOPHEN 650 MG RE SUPP
650.0000 mg | Freq: Four times a day (QID) | RECTAL | Status: DC | PRN
Start: 2013-10-04 — End: 2013-10-06

## 2013-10-04 MED ORDER — MORPHINE SULFATE 2 MG/ML IJ SOLN: 1.0000 mg | INTRAMUSCULAR | Status: DC | PRN

## 2013-10-04 MED ORDER — SODIUM CHLORIDE 0.9 % IV SOLN
INTRAVENOUS | Status: DC
  Administered 2013-10-04: 14:00:00 via INTRAVENOUS

## 2013-10-04 MED ORDER — ONDANSETRON HCL 4 MG/2ML IJ SOLN
4.0000 mg | Freq: Once | INTRAMUSCULAR | Status: AC
  Administered 2013-10-04: 4 mg via INTRAVENOUS
  Filled 2013-10-04: qty 2

## 2013-10-04 MED ORDER — INSULIN GLARGINE 100 UNIT/ML ~~LOC~~ SOLN
40.0000 [IU] | Freq: Every day | SUBCUTANEOUS | Status: DC
  Administered 2013-10-04 – 2013-10-05 (×2): 40 [IU] via SUBCUTANEOUS
  Filled 2013-10-04 (×3): qty 0.4

## 2013-10-04 MED ORDER — CLONAZEPAM 0.5 MG PO TABS
0.5000 mg | ORAL_TABLET | Freq: Every day | ORAL | Status: DC
  Administered 2013-10-04 – 2013-10-05 (×2): 0.5 mg via ORAL
  Filled 2013-10-04 (×2): qty 1

## 2013-10-04 MED ORDER — SODIUM CHLORIDE 0.9 % IV BOLUS (SEPSIS)
1000.0000 mL | Freq: Once | INTRAVENOUS | Status: AC
  Administered 2013-10-04: 1000 mL via INTRAVENOUS

## 2013-10-04 MED ORDER — ONDANSETRON HCL 4 MG PO TABS
4.0000 mg | ORAL_TABLET | Freq: Four times a day (QID) | ORAL | Status: DC | PRN
Start: 2013-10-04 — End: 2013-10-06

## 2013-10-04 MED ORDER — DULOXETINE HCL 60 MG PO CPEP
90.0000 mg | ORAL_CAPSULE | Freq: Every day | ORAL | Status: DC
  Administered 2013-10-04 – 2013-10-06 (×3): 90 mg via ORAL
  Filled 2013-10-04 (×4): qty 1

## 2013-10-04 NOTE — ED Notes (Signed)
Bed: [id number] Expected date:  Expected time:  Means of arrival:  Comments: EMS 52yo F N/V/D, weakness

## 2013-10-04 NOTE — ED Notes (Signed)
I have attempted to call report--floor nurse to call me back shortly.

## 2013-10-04 NOTE — ED Notes (Signed)
She tells me she has had no more vomiting, about which she is quite pleased.  She thanks me for treating her upper abd. Discomfort.  U/s is here and are beginning their test.

## 2013-10-04 NOTE — ED Notes (Signed)
I have just given report to Ene, RN on [place] and will trasport shortly.

## 2013-10-04 NOTE — ED Notes (Signed)
I have just drawn her labs and given IV antiemetic. She c/odiffuse upper abd. Discomfort  "I think it's from throwing up so much"; and she tells me she's been "throwing up for 10 hours".  She denies fever/cough/diarrhea.

## 2013-10-04 NOTE — ED Notes (Signed)
8mg  Zofran given to pt by EMS

## 2013-10-04 NOTE — ED Notes (Signed)
She continues to be in on distress, and tells me she feels "better".  Dr. Tamera Punt has just told her of plan to admit, with which she agrees.

## 2013-10-04 NOTE — ED Notes (Signed)
Per EMS pt started having n/v last night, denies diarrhea, complaining of generalized weakness. Not able to keep any food or fluids down, CBG 449, has not taken insulin this morning. No other complaints.

## 2013-10-04 NOTE — Progress Notes (Signed)
Inpatient Diabetes Program Recommendations  AACE/ADA: New Consensus Statement on Inpatient Glycemic Control (2013)  Target Ranges:  Prepandial:   less than 140 mg/dL      Peak postprandial:   less than 180 mg/dL (1-2 hours)      Critically ill patients:  140 - 180 mg/dL    Reason for Assessment: Admitted with N&V, Hyperglycemia (glucose 545 mg/dl on admission).  Patient did not take insulin yesterday.  Home DM meds: Lantus 40 units bid + Novolog 40 units tidwc   **Note pt for admission today from ED.  Last A1c on file was 12.4% from 09/23/12.   MD- Please check current A1c as last one on file was from 2014    Will follow. Wyn Quaker RN, MSN, CDE Diabetes Coordinator Inpatient Diabetes Program Team Pager: [phone] (8a-10p)

## 2013-10-04 NOTE — H&P (Signed)
[facility]          History and Physical    PCP:   Default, Provider, MD   Chief Complaint:  Epigastric abdominal pain, nausea, vomiting  HPI: Patient is a 53 year old white lady with past medical history significant for anxiety, depression, hypothyroidism, hypertension who presents to the hospital today with the above-mentioned complaints. She states the nausea began at about 14 hours ago. She has vomited numerous times since then. She then started complaining of epigastric and left upper quadrant abdominal pain radiating to her back in a belt-like fashion. Because of her inability to control her symptoms at home she came to the emergency department where she was found to have a lipase of around 600, LFTs within normal limits. We are asked to admit her for further evaluation and management. She denies alcohol use.  Allergies:   Allergies  Allergen Reactions  . Erythromycin Anaphylaxis  . Vancomycin Other (See Comments)    Red Man Syndrome      Past Medical History  Diagnosis Date  . HTN (hypertension)   . Macular degeneration, bilateral   . Hypercholesteremia   . Hypothyroidism   . Diabetes mellitus type I   . Migraines 1980's    "when I was in my 20's" (09/23/2012)  . Stroke ? date & 09/22/2012  . Peripheral neuropathy   . Anxiety   . Diabetic osteomyelitis     right great toe/progress notes 09/23/2012  . Depression   . PTSD (post-traumatic stress disorder)     Past Surgical History  Procedure Laterality Date  . Cesarean section  1989; 1995  . Tonsillectomy  1965  . Refractive surgery Bilateral 2013  . Total thyroidectomy  2012  . Peripherally inserted central catheter insertion Right 08/31/2012  . I&d extremity Right     "big toe"; multiple    Prior to Admission medications   Medication Sig Start Date End Date Taking? Authorizing Provider  aspirin 325 MG tablet Take 1 tablet (325 mg total) by mouth daily. 09/24/12  Yes Costin Karlyne Greenspan, MD   Bilberry 1000 MG CAPS Take 5,000 Units by mouth daily after breakfast.   Yes Historical Provider, MD  clonazePAM (KLONOPIN) 0.5 MG tablet Take 0.5 mg by mouth at bedtime.   Yes Historical Provider, MD  DULoxetine (CYMBALTA) 30 MG capsule Take 90 mg by mouth daily after breakfast.   Yes Historical Provider, MD  insulin aspart (NOVOLOG) 100 UNIT/ML injection Inject 40 Units into the skin 3 (three) times daily before meals.   Yes Historical Provider, MD  insulin glargine (LANTUS) 100 UNIT/ML injection Inject 40 Units into the skin 2 (two) times daily.   Yes Historical Provider, MD  levothyroxine (SYNTHROID, LEVOTHROID) 137 MCG tablet Take 137 mcg by mouth daily.     Yes Historical Provider, MD  lisinopril-hydrochlorothiazide (PRINZIDE,ZESTORETIC) 20-25 MG per tablet Take 1 tablet by mouth daily.    Yes Historical Provider, MD  simvastatin (ZOCOR) 20 MG tablet Take 20 mg by mouth at bedtime.   Yes Historical Provider, MD    Social History:  reports that she has never smoked. She has never used smokeless tobacco. She reports that she does not drink alcohol or use illicit drugs.  Family History  Problem Relation Age of Onset  . Depression Mother   . Alcohol abuse Father   . Alcohol abuse Sister     Review of Systems:  Constitutional: Denies fever, chills, diaphoresis, appetite change and fatigue.  HEENT: Denies photophobia, eye pain, redness, hearing  loss, ear pain, congestion, sore throat, rhinorrhea, sneezing, mouth sores, trouble swallowing, neck pain, neck stiffness and tinnitus.   Respiratory: Denies SOB, DOE, cough, chest tightness,  and wheezing.   Cardiovascular: Denies chest pain, palpitations and leg swelling.  Gastrointestinal: Denies diarrhea, constipation, blood in stool and abdominal distention.  Genitourinary: Denies dysuria, urgency, frequency, hematuria, flank pain and difficulty urinating.  Endocrine: Denies: hot or cold intolerance, sweats, changes in hair or nails,  polyuria, polydipsia. Musculoskeletal: Denies myalgias, back pain, joint swelling, arthralgias and gait problem.  Skin: Denies pallor, rash and wound.  Neurological: Denies dizziness, seizures, syncope, weakness, light-headedness, numbness and headaches.  Hematological: Denies adenopathy. Easy bruising, personal or family bleeding history  Psychiatric/Behavioral: Denies suicidal ideation, mood changes, confusion, nervousness, sleep disturbance and agitation   Physical Exam: Blood pressure 131/76, pulse 89, temperature 98.8 F (37.1 C), temperature source Oral, resp. rate 20, height _0  (1.6 m), weight 71.5 kg (157 lb 10.1 oz), SpO2 95.00%. Gen.: Alert, awake, oriented x3, retching throughout exam. HEENT: Normocephalic, atraumatic, pupils equal and reactive to light, extraocular movements intact, dry mucous membranes. Neck: Supple, no JVD, no lymphadenopathy, no bruits, no goiter. Cardiovascular: Regular rate and rhythm, no murmurs, rubs or gallops. Lungs: Clear to auscultation bilaterally. Abdomen: Soft, nondistended, positive bowel sounds, tender to palpation to the epigastric area. Extremities: No clubbing, cyanosis or edema, positive pedal pulses. Neurologic: Grossly intact and nonfocal.  Labs on Admission:  Results for orders placed during the hospital encounter of 10/04/13 (from the past 48 hour(s))  CBC WITH DIFFERENTIAL     Status: Abnormal   Collection Time    10/04/13  7:25 AM      Result Value Ref Range   WBC 17.2 (*) 4.0 - 10.5 K/uL   RBC 4.77  3.87 - 5.11 MIL/uL   Hemoglobin 14.8  12.0 - 15.0 g/dL   HCT 41.6  36.0 - 46.0 %   MCV 87.2  78.0 - 100.0 fL   MCH 31.0  26.0 - 34.0 pg   MCHC 35.6  30.0 - 36.0 g/dL   RDW 11.9  11.5 - 15.5 %   Platelets 328  150 - 400 K/uL   Neutrophils Relative % 88 (*) 43 - 77 %   Neutro Abs 15.1 (*) 1.7 - 7.7 K/uL   Lymphocytes Relative 5 (*) 12 - 46 %   Lymphs Abs 0.9  0.7 - 4.0 K/uL   Monocytes Relative 5  3 - 12 %   Monocytes  Absolute 0.9  0.1 - 1.0 K/uL   Eosinophils Relative 1  0 - 5 %   Eosinophils Absolute 0.2  0.0 - 0.7 K/uL   Basophils Relative 0  0 - 1 %   Basophils Absolute 0.0  0.0 - 0.1 K/uL  COMPREHENSIVE METABOLIC PANEL     Status: Abnormal   Collection Time    10/04/13  7:25 AM      Result Value Ref Range   Sodium 131 (*) 137 - 147 mEq/L   Potassium 3.8  3.7 - 5.3 mEq/L   Chloride 90 (*) 96 - 112 mEq/L   CO2 23  19 - 32 mEq/L   Glucose, Bld 545 (*) 70 - 99 mg/dL   BUN 20  6 - 23 mg/dL   Creatinine, Ser 0.60  0.50 - 1.10 mg/dL   Calcium 9.4  8.4 - 10.5 mg/dL   Total Protein 7.5  6.0 - 8.3 g/dL   Albumin 3.5  3.5 - 5.2 g/dL   AST  17  0 - 37 U/L   ALT 22  0 - 35 U/L   Alkaline Phosphatase 117  39 - 117 U/L   Total Bilirubin 0.6  0.3 - 1.2 mg/dL   GFR calc non Af Amer >90  >90 mL/min   GFR calc Af Amer >90  >90 mL/min   Comment: (NOTE)     The eGFR has been calculated using the CKD EPI equation.     This calculation has not been validated in all clinical situations.     eGFR's persistently <90 mL/min signify possible Chronic Kidney     Disease.  LIPASE, BLOOD     Status: Abnormal   Collection Time    10/04/13  7:25 AM      Result Value Ref Range   Lipase 597 (*) 11 - 59 U/L  URINALYSIS, ROUTINE W REFLEX MICROSCOPIC     Status: Abnormal   Collection Time    10/04/13  9:17 AM      Result Value Ref Range   Color, Urine YELLOW  YELLOW   APPearance CLEAR  CLEAR   Specific Gravity, Urine 1.037 (*) 1.005 - 1.030   pH 5.0  5.0 - 8.0   Glucose, UA >1000 (*) NEGATIVE mg/dL   Hgb urine dipstick NEGATIVE  NEGATIVE   Bilirubin Urine NEGATIVE  NEGATIVE   Ketones, ur >80 (*) NEGATIVE mg/dL   Protein, ur NEGATIVE  NEGATIVE mg/dL   Urobilinogen, UA 0.2  0.0 - 1.0 mg/dL   Nitrite NEGATIVE  NEGATIVE   Leukocytes, UA NEGATIVE  NEGATIVE  URINE MICROSCOPIC-ADD ON     Status: None   Collection Time    10/04/13  9:17 AM      Result Value Ref Range   Squamous Epithelial / LPF RARE  RARE   WBC, UA  3-6  <3 WBC/hpf   Bacteria, UA RARE  RARE  CBG MONITORING, ED     Status: Abnormal   Collection Time    10/04/13  9:19 AM      Result Value Ref Range   Glucose-Capillary 360 (*) 70 - 99 mg/dL  GLUCOSE, CAPILLARY     Status: Abnormal   Collection Time    10/04/13 12:27 PM      Result Value Ref Range   Glucose-Capillary 301 (*) 70 - 99 mg/dL   Comment 1 Notify RN      Radiological Exams on Admission: US Abdomen Complete  10/04/2013   CLINICAL DATA:  Abdominal pain  EXAM: ULTRASOUND ABDOMEN COMPLETE  COMPARISON:  CT ABD/PELVIS W CM dated 02/20/2011  FINDINGS: Gallbladder:  No gallstones or wall thickening visualized. No sonographic Murphy sign noted.  Common bile duct:  Diameter: 4 mm  Liver:  Hepatic hypodensity likely indicates underlying steatosis without focal abnormality or intrahepatic ductal dilatation.  IVC:  No abnormality visualized.  Pancreas:  Visualized portion unremarkable.  Spleen:  Size and appearance within normal limits.  Right Kidney:  Length: 12.2 cm. Echogenicity within normal limits. No mass or hydronephrosis visualized.  Left Kidney:  Length: 11.3 cm. Echogenicity within normal limits. No mass or hydronephrosis visualized.  Abdominal aorta:  No aneurysm visualized.  Other findings:  None.  IMPRESSION: No acute intra-abdominal abnormality.   Electronically Signed   By: Conchita Paris M.D.   On: 10/04/2013 09:09    Assessment/Plan Principal Problem:   Pancreatitis, acute Active Problems:   Anxiety   Depression   Diabetes type 1, uncontrolled   Leukocytosis, unspecified   Acute pancreatitis  Acute pancreatitis -Will admit for supportive management including IV fluids, as needed pain medications and anti-emetics. -N.p.o. except for medications. -Etiology remains unclear: Denies alcohol use, no evidence for gallstones on ultrasound.  Leukocytosis -Likely related to acute pancreatitis. -Follow.  Insulin-dependent diabetes mellitus with hyperglycemia -Check  hemoglobin A1c. -Start sliding scale insulin and half of her Lantus dose given she will be n.p.o.  Anxiety/depression -Continue home medications  DVT prophylaxis -Lovenox.   CODE STATUS -Full Code   Time Spent on Admission: 75 minutes  HERNANDEZ ACOSTA,ESTELA [facility] Pager: [phone] 10/04/2013, 2:36 PM

## 2013-10-04 NOTE — ED Provider Notes (Signed)
CSN: [id number]     Arrival date & time 10/04/13  [id number] History   First MD Initiated Contact with Patient 10/04/13 [phone]     Chief Complaint  Patient presents with  . Nausea  . Emesis  . Weakness     (Consider location/radiation/quality/duration/timing/severity/associated sxs/prior Treatment) HPI Comments: Patient presents with nausea vomiting. She states it started last night. She also has some epigastric pain. She denies any diarrhea. Emesis is nonbloody and nonbilious. She has a history of insulin-dependent diabetes mellitus and it's her blood sugars have been elevated. She did not take her insulin this morning. She hasn't had any fevers or chills. She denies a known history of gastroparesis. She states she has a burning pain in her abdomen which she points to her epigastrium. It's been constant since last night. She was given Zofran in route by EMS but denies any improvement of symptoms after that.  Patient is a 53 y.o. female presenting with vomiting and weakness.  Emesis Associated symptoms: abdominal pain   Associated symptoms: no arthralgias, no chills, no diarrhea and no headaches   Weakness Associated symptoms include abdominal pain. Pertinent negatives include no chest pain, no headaches and no shortness of breath.    Past Medical History  Diagnosis Date  . HTN (hypertension)   . Macular degeneration, bilateral   . Hypercholesteremia   . Hypothyroidism   . Diabetes mellitus type I   . Migraines 1980's    "when I was in my 20's" (09/23/2012)  . Stroke ? date & 09/22/2012  . Peripheral neuropathy   . Anxiety   . Diabetic osteomyelitis     right great toe/progress notes 09/23/2012  . Depression   . PTSD (post-traumatic stress disorder)    Past Surgical History  Procedure Laterality Date  . Cesarean section  1989; 1995  . Tonsillectomy  1965  . Refractive surgery Bilateral 2013  . Total thyroidectomy  2012  . Peripherally inserted central catheter insertion Right  08/31/2012  . I&d extremity Right     "big toe"; multiple   Family History  Problem Relation Age of Onset  . Depression Mother   . Alcohol abuse Father   . Alcohol abuse Sister    History  Substance Use Topics  . Smoking status: Never Smoker   . Smokeless tobacco: Never Used  . Alcohol Use: No   OB History   Grav Para Term Preterm Abortions TAB SAB Ect Mult Living                 Review of Systems  Constitutional: Positive for fatigue. Negative for fever, chills and diaphoresis.  HENT: Negative for congestion, rhinorrhea and sneezing.   Eyes: Negative.   Respiratory: Negative for cough, chest tightness and shortness of breath.   Cardiovascular: Negative for chest pain and leg swelling.  Gastrointestinal: Positive for nausea, vomiting and abdominal pain. Negative for diarrhea and blood in stool.  Genitourinary: Negative for frequency, hematuria, flank pain and difficulty urinating.  Musculoskeletal: Negative for arthralgias and back pain.  Skin: Negative for rash.  Neurological: Positive for weakness (generalized weakness). Negative for dizziness, speech difficulty, numbness and headaches.      Allergies  Erythromycin and Vancomycin  Home Medications   Current Outpatient Rx  Name  Route  Sig  Dispense  Refill  . aspirin 325 MG tablet   Oral   Take 1 tablet (325 mg total) by mouth daily.   90 tablet   2   . Bilberry 1000 MG CAPS  Oral   Take 5,000 Units by mouth daily after breakfast.         . clonazePAM (KLONOPIN) 0.5 MG tablet   Oral   Take 0.5 mg by mouth at bedtime.         . DULoxetine (CYMBALTA) 30 MG capsule   Oral   Take 90 mg by mouth daily after breakfast.         . insulin aspart (NOVOLOG) 100 UNIT/ML injection   Subcutaneous   Inject 40 Units into the skin 3 (three) times daily before meals.         . insulin glargine (LANTUS) 100 UNIT/ML injection   Subcutaneous   Inject 40 Units into the skin 2 (two) times daily.         Marland Kitchen  levothyroxine (SYNTHROID, LEVOTHROID) 137 MCG tablet   Oral   Take 137 mcg by mouth daily.           Marland Kitchen lisinopril-hydrochlorothiazide (PRINZIDE,ZESTORETIC) 20-25 MG per tablet   Oral   Take 1 tablet by mouth daily.          . simvastatin (ZOCOR) 20 MG tablet   Oral   Take 20 mg by mouth at bedtime.          BP 153/96  Pulse 106  Temp(Src) 97.9 F (36.6 C) (Oral)  Resp 18  Ht 5\' 3"  (1.6 m)  Wt 157 lb (71.215 kg)  BMI 27.82 kg/m2  SpO2 99% Physical Exam  Constitutional: She is oriented to person, place, and time. She appears well-developed and well-nourished.  HENT:  Head: Normocephalic and atraumatic.  Slightly dry mucus membranes  Eyes: Pupils are equal, round, and reactive to light.  Neck: Normal range of motion. Neck supple.  Cardiovascular: Normal rate, regular rhythm and normal heart sounds.   Pulmonary/Chest: Effort normal and breath sounds normal. No respiratory distress. She has no wheezes. She has no rales. She exhibits no tenderness.  Abdominal: Soft. Bowel sounds are normal. There is tenderness (mild epigastric tenderness). There is no rebound and no guarding.  Musculoskeletal: Normal range of motion. She exhibits no edema.  Lymphadenopathy:    She has no cervical adenopathy.  Neurological: She is alert and oriented to person, place, and time.  Skin: Skin is warm and dry. No rash noted.  Psychiatric: She has a normal mood and affect.    ED Course  Procedures (including critical care time) Labs Review Results for orders placed during the hospital encounter of 10/04/13  CBC WITH DIFFERENTIAL      Result Value Ref Range   WBC 17.2 (*) 4.0 - 10.5 K/uL   RBC 4.77  3.87 - 5.11 MIL/uL   Hemoglobin 14.8  12.0 - 15.0 g/dL   HCT 41.6  36.0 - 46.0 %   MCV 87.2  78.0 - 100.0 fL   MCH 31.0  26.0 - 34.0 pg   MCHC 35.6  30.0 - 36.0 g/dL   RDW 11.9  11.5 - 15.5 %   Platelets 328  150 - 400 K/uL   Neutrophils Relative % 88 (*) 43 - 77 %   Neutro Abs 15.1 (*) 1.7  - 7.7 K/uL   Lymphocytes Relative 5 (*) 12 - 46 %   Lymphs Abs 0.9  0.7 - 4.0 K/uL   Monocytes Relative 5  3 - 12 %   Monocytes Absolute 0.9  0.1 - 1.0 K/uL   Eosinophils Relative 1  0 - 5 %   Eosinophils Absolute 0.2  0.0 -  0.7 K/uL   Basophils Relative 0  0 - 1 %   Basophils Absolute 0.0  0.0 - 0.1 K/uL  COMPREHENSIVE METABOLIC PANEL      Result Value Ref Range   Sodium 131 (*) 137 - 147 mEq/L   Potassium 3.8  3.7 - 5.3 mEq/L   Chloride 90 (*) 96 - 112 mEq/L   CO2 23  19 - 32 mEq/L   Glucose, Bld 545 (*) 70 - 99 mg/dL   BUN 20  6 - 23 mg/dL   Creatinine, Ser 0.60  0.50 - 1.10 mg/dL   Calcium 9.4  8.4 - 10.5 mg/dL   Total Protein 7.5  6.0 - 8.3 g/dL   Albumin 3.5  3.5 - 5.2 g/dL   AST 17  0 - 37 U/L   ALT 22  0 - 35 U/L   Alkaline Phosphatase 117  39 - 117 U/L   Total Bilirubin 0.6  0.3 - 1.2 mg/dL   GFR calc non Af Amer >90  >90 mL/min   GFR calc Af Amer >90  >90 mL/min  LIPASE, BLOOD      Result Value Ref Range   Lipase 597 (*) 11 - 59 U/L  CBG MONITORING, ED      Result Value Ref Range   Glucose-Capillary 360 (*) 70 - 99 mg/dL   US Abdomen Complete  10/04/2013   CLINICAL DATA:  Abdominal pain  EXAM: ULTRASOUND ABDOMEN COMPLETE  COMPARISON:  CT ABD/PELVIS W CM dated 02/20/2011  FINDINGS: Gallbladder:  No gallstones or wall thickening visualized. No sonographic Murphy sign noted.  Common bile duct:  Diameter: 4 mm  Liver:  Hepatic hypodensity likely indicates underlying steatosis without focal abnormality or intrahepatic ductal dilatation.  IVC:  No abnormality visualized.  Pancreas:  Visualized portion unremarkable.  Spleen:  Size and appearance within normal limits.  Right Kidney:  Length: 12.2 cm. Echogenicity within normal limits. No mass or hydronephrosis visualized.  Left Kidney:  Length: 11.3 cm. Echogenicity within normal limits. No mass or hydronephrosis visualized.  Abdominal aorta:  No aneurysm visualized.  Other findings:  None.  IMPRESSION: No acute intra-abdominal  abnormality.   Electronically Signed   By: Conchita Paris M.D.   On: 10/04/2013 09:09     Imaging Review US Abdomen Complete  10/04/2013   CLINICAL DATA:  Abdominal pain  EXAM: ULTRASOUND ABDOMEN COMPLETE  COMPARISON:  CT ABD/PELVIS W CM dated 02/20/2011  FINDINGS: Gallbladder:  No gallstones or wall thickening visualized. No sonographic Murphy sign noted.  Common bile duct:  Diameter: 4 mm  Liver:  Hepatic hypodensity likely indicates underlying steatosis without focal abnormality or intrahepatic ductal dilatation.  IVC:  No abnormality visualized.  Pancreas:  Visualized portion unremarkable.  Spleen:  Size and appearance within normal limits.  Right Kidney:  Length: 12.2 cm. Echogenicity within normal limits. No mass or hydronephrosis visualized.  Left Kidney:  Length: 11.3 cm. Echogenicity within normal limits. No mass or hydronephrosis visualized.  Abdominal aorta:  No aneurysm visualized.  Other findings:  None.  IMPRESSION: No acute intra-abdominal abnormality.   Electronically Signed   By: Conchita Paris M.D.   On: 10/04/2013 09:09     EKG Interpretation None      MDM   Final diagnoses:  Pancreatitis    Patient presents with upper abdominal discomfort and associated vomiting. She has evidence of pancreatitis. There is no gallbladder disease an ultrasound. She's comfortable after IV pain medication and Phenergan. She was given IV fluids as  well. I will contact the hospitalist for admission.    Malvin Johns, MD 10/04/13 [phone]

## 2013-10-05 LAB — CBC
HCT: 37.9 % (ref 36.0–46.0)
Hemoglobin: 12.8 g/dL (ref 12.0–15.0)
MCH: 30.3 pg (ref 26.0–34.0)
MCHC: 33.8 g/dL (ref 30.0–36.0)
MCV: 89.8 fL (ref 78.0–100.0)
Platelets: 315 10*3/uL (ref 150–400)
RBC: 4.22 MIL/uL (ref 3.87–5.11)
RDW: 12.4 % (ref 11.5–15.5)
WBC: 8.5 10*3/uL (ref 4.0–10.5)

## 2013-10-05 LAB — BASIC METABOLIC PANEL
BUN: 16 mg/dL (ref 6–23)
CO2: 25 mEq/L (ref 19–32)
Calcium: 7.7 mg/dL — ABNORMAL LOW (ref 8.4–10.5)
Chloride: 96 mEq/L (ref 96–112)
Creatinine, Ser: 0.7 mg/dL (ref 0.50–1.10)
GFR calc Af Amer: >90 mL/min (ref >90)
GFR calc non Af Amer: >90 mL/min (ref >90)
Glucose, Bld: 157 mg/dL — ABNORMAL HIGH (ref 70–99)
Potassium: 3.2 mEq/L — ABNORMAL LOW (ref 3.7–5.3)
Sodium: 135 mEq/L — ABNORMAL LOW (ref 137–147)

## 2013-10-05 LAB — GLUCOSE, CAPILLARY
Glucose-Capillary: 130 mg/dL — ABNORMAL HIGH (ref 70–99)
Glucose-Capillary: 113 mg/dL — ABNORMAL HIGH (ref 70–99)
Glucose-Capillary: 72 mg/dL (ref 70–99)
Glucose-Capillary: 108 mg/dL — ABNORMAL HIGH (ref 70–99)

## 2013-10-05 MED ORDER — POTASSIUM CHLORIDE 10 MEQ/100ML IV SOLN
10.0000 meq | INTRAVENOUS | Status: AC
  Administered 2013-10-05 (×3): 10 meq via INTRAVENOUS
  Filled 2013-10-05 (×3): qty 100

## 2013-10-05 MED ORDER — LOPERAMIDE HCL 2 MG PO CAPS
2.0000 mg | ORAL_CAPSULE | Freq: Three times a day (TID) | ORAL | Status: DC | PRN
Start: 2013-10-05 — End: 2013-10-06
  Administered 2013-10-05 – 2013-10-06 (×2): 2 mg via ORAL
  Filled 2013-10-05 (×2): qty 1

## 2013-10-05 MED ORDER — OXYCODONE HCL 5 MG PO TABS: 5.0000 mg | ORAL_TABLET | ORAL | Status: DC | PRN

## 2013-10-05 MED ORDER — LOPERAMIDE HCL 2 MG PO CAPS
2.0000 mg | ORAL_CAPSULE | Freq: Once | ORAL | Status: AC
  Administered 2013-10-05: 2 mg via ORAL
  Filled 2013-10-05: qty 1

## 2013-10-05 NOTE — Progress Notes (Signed)
[facility] PROGRESS NOTE  Suzanne Allen [id number] DOB: September 07, 1960 DOA: 10/04/2013 PCP: Default, Provider, MD  Assessment/Plan: Acute Pancreatitis -Symptomatically improved. -Will advance diet.  IDDM -Improved CBGs this am. -May need to increase lantus and/or add meal coverage as we advance her diet.  Anxiety/Depression -Continue home meds.  Code Status: Full Code Family Communication: Patient only  Disposition Plan: Home when ready; likely 24-48 hours.   Consultants:  None   Antibiotics:  None   Subjective: Feels better today.  Objective: Filed Vitals:   10/04/13 1400 10/04/13 2230 10/05/13 0700 10/05/13 0837  BP: 131/76 123/82 117/68 121/66  Pulse: 89 98 86 76  Temp: 98.8 F (37.1 C) 99.9 F (37.7 C) 99 F (37.2 C) 97.6 F (36.4 C)  TempSrc: Oral Oral Oral Oral  Resp: 20 18 18 16   Height:      Weight:      SpO2: 95% 98% 100% 100%    Intake/Output Summary (Last 24 hours) at 10/05/13 1433 Last data filed at 10/05/13 0400  Gross per 24 hour  Intake 1446.67 ml  Output      0 ml  Net 1446.67 ml   Filed Weights   10/04/13 0659 10/04/13 1100  Weight: 71.215 kg (157 lb) 71.5 kg (157 lb 10.1 oz)    Exam:   General:  AA Ox3  Cardiovascular: RRR  Respiratory: CTA B  Abdomen: S/ND/+BS  Extremities: no C/C/E   Neurologic:  Intact, non-focal.  Data Reviewed: Basic Metabolic Panel:  Recent Labs Lab 10/04/13 0725 10/05/13 0435  NA 131* 135*  K 3.8 3.2*  CL 90* 96  CO2 23 25  GLUCOSE 545* 157*  BUN 20 16  CREATININE 0.60 0.70  CALCIUM 9.4 7.7*   Liver Function Tests:  Recent Labs Lab 10/04/13 0725  AST 17  ALT 22  ALKPHOS 117  BILITOT 0.6  PROT 7.5  ALBUMIN 3.5    Recent Labs Lab 10/04/13 0725  LIPASE 597*   No results found for this basename: AMMONIA,  in the last 168 hours CBC:  Recent Labs Lab 10/04/13 0725 10/05/13 0435  WBC 17.2* 8.5  NEUTROABS 15.1*  --   HGB 14.8 12.8  HCT 41.6 37.9  MCV 87.2  89.8  PLT 328 315   Cardiac Enzymes: No results found for this basename: CKTOTAL, CKMB, CKMBINDEX, TROPONINI,  in the last 168 hours BNP (last 3 results) No results found for this basename: PROBNP,  in the last 8760 hours CBG:  Recent Labs Lab 10/04/13 1227 10/04/13 1706 10/04/13 2144 10/05/13 0822 10/05/13 1134  GLUCAP 301* 331* 218* 130* 113*    No results found for this or any previous visit (from the past 240 hour(s)).   Studies: US Abdomen Complete  10/04/2013   CLINICAL DATA:  Abdominal pain  EXAM: ULTRASOUND ABDOMEN COMPLETE  COMPARISON:  CT ABD/PELVIS W CM dated 02/20/2011  FINDINGS: Gallbladder:  No gallstones or wall thickening visualized. No sonographic Murphy sign noted.  Common bile duct:  Diameter: 4 mm  Liver:  Hepatic hypodensity likely indicates underlying steatosis without focal abnormality or intrahepatic ductal dilatation.  IVC:  No abnormality visualized.  Pancreas:  Visualized portion unremarkable.  Spleen:  Size and appearance within normal limits.  Right Kidney:  Length: 12.2 cm. Echogenicity within normal limits. No mass or hydronephrosis visualized.  Left Kidney:  Length: 11.3 cm. Echogenicity within normal limits. No mass or hydronephrosis visualized.  Abdominal aorta:  No aneurysm visualized.  Other findings:  None.  IMPRESSION: No acute intra-abdominal  abnormality.   Electronically Signed   By: Conchita Paris M.D.   On: 10/04/2013 09:09    Scheduled Meds: . clonazePAM  0.5 mg Oral QHS  . DULoxetine  90 mg Oral QPC breakfast  . enoxaparin (LOVENOX) injection  40 mg Subcutaneous Q24H  . insulin aspart  0-15 Units Subcutaneous TID WC  . insulin glargine  40 Units Subcutaneous QHS  . levothyroxine  137 mcg Oral QAC breakfast   Continuous Infusions: . sodium chloride 100 mL/hr at 10/04/13 1332    Principal Problem:   Pancreatitis, acute Active Problems:   Anxiety   Depression   Diabetes type 1, uncontrolled   Leukocytosis, unspecified   Acute  pancreatitis    Time spent: 35 minutes. Greater than 50% of this time was spent in direct contact with the patient coordinating care.    Lelon Frohlich  [facility] Pager [phone]  If 7PM-7AM, please contact night-coverage at [email or web address], password [facility] 10/05/2013, 2:33 PM  LOS: 1 day

## 2013-10-05 NOTE — Progress Notes (Signed)
Resumed care of patient.  No change in patient assessment.  Will continue to monitor 

## 2013-10-05 NOTE — Progress Notes (Signed)
Diarrhea x 2 over 8 hours. On call notified, awaiting response

## 2013-10-05 NOTE — Progress Notes (Addendum)
Inpatient Diabetes Program Recommendations  AACE/ADA: New Consensus Statement on Inpatient Glycemic Control (2013)  Target Ranges:  Prepandial:   less than 140 mg/dL      Peak postprandial:   less than 180 mg/dL (1-2 hours)      Critically ill patients:  140 - 180 mg/dL     Results for Suzanne Allen, Suzanne Allen (MRN [id number]) as of 10/05/2013 10:47  Ref. Range 10/04/2013 09:19 10/04/2013 12:27 10/04/2013 17:06 10/04/2013 21:44  Glucose-Capillary Latest Range: 70-99 mg/dL 360 (H) 301 (H) 331 (H) 218 (H)    Results for Suzanne Allen, Suzanne Allen (MRN [id number]) as of 10/05/2013 10:47  Ref. Range 10/05/2013 08:22  Glucose-Capillary Latest Range: 70-99 mg/dL 130 (H)   Results for Suzanne Allen, Suzanne Allen (MRN [id number]) as of 10/05/2013 10:47  Ref. Range 10/04/2013 07:25  Hemoglobin A1C Latest Range: <5.7 % 14.5 (H)     **Patient admitted with pancreatitis.  A1c shows extremely poor glucose control at home.  **Per records, pt is supposed to be taking Lantus 40 units bid + Novolog 40 units tid at home  **Note that Lantus 40 units QHS started last night.  Fasting glucose WNL this AM (130 mg/dl).  Would NOT further increase Lantus as of now.  **Noted pt to start CL diet this AM.  May need some Novolog meal coverage added back at some point.  Will speak with pt today about her elevated A1c.   Addendum 1337pm: Spoke with pt about her elevated A1c of 14.5%.  Explained what an A1c is and what it measures.  Reminded patient that her goal A1c is 7% or less per ADA standards to prevent both acute and long-term complications.  Pt told me that she has never gotten her A1c below 9% before.  Pt also states she has had DM since she was a teenager.  Pt stated she used to count carbohydrates and give herself Novolog based on her carb count, however, that became too overwhelming for her (pt states she has a mentally disabled daughter that requires a lot of her time).  Pt states she estimates how much insulin she needs and does the best she can.  Sees  Dr. Steffanie Dunn with [facility] in [place] for her endocrinology visits.  Also sees a PCP at [facility].  Encouraged pt to follow up with her Endocrinologist after d/c to further manage her DM.  Gave pt educational pamphlets on DM and carbohydrate counting.    Will follow. Wyn Quaker RN, MSN, CDE Diabetes Coordinator Inpatient Diabetes Program Team Pager: [phone] (8a-10p)

## 2013-10-06 LAB — CBC
HCT: 35 % — ABNORMAL LOW (ref 36.0–46.0)
Hemoglobin: 12 g/dL (ref 12.0–15.0)
MCH: 30.6 pg (ref 26.0–34.0)
MCHC: 34.3 g/dL (ref 30.0–36.0)
MCV: 89.3 fL (ref 78.0–100.0)
Platelets: 276 10*3/uL (ref 150–400)
RBC: 3.92 MIL/uL (ref 3.87–5.11)
RDW: 12.3 % (ref 11.5–15.5)
WBC: 9.2 10*3/uL (ref 4.0–10.5)

## 2013-10-06 LAB — BASIC METABOLIC PANEL
BUN: 8 mg/dL (ref 6–23)
CO2: 22 mEq/L (ref 19–32)
Calcium: 7.1 mg/dL — ABNORMAL LOW (ref 8.4–10.5)
Chloride: 107 mEq/L (ref 96–112)
Creatinine, Ser: 0.6 mg/dL (ref 0.50–1.10)
GFR calc Af Amer: >90 mL/min (ref >90)
GFR calc non Af Amer: >90 mL/min (ref >90)
Glucose, Bld: 88 mg/dL (ref 70–99)
Potassium: 3.1 mEq/L — ABNORMAL LOW (ref 3.7–5.3)
Sodium: 141 mEq/L (ref 137–147)

## 2013-10-06 LAB — GLUCOSE, CAPILLARY
Glucose-Capillary: 75 mg/dL (ref 70–99)
Glucose-Capillary: 82 mg/dL (ref 70–99)

## 2013-10-06 LAB — CLOSTRIDIUM DIFFICILE BY PCR: Toxigenic C. Difficile by PCR: NEGATIVE

## 2013-10-06 LAB — MAGNESIUM: Magnesium: 1.6 mg/dL (ref 1.5–2.5)

## 2013-10-06 MED ORDER — POTASSIUM CHLORIDE 10 MEQ/100ML IV SOLN
10.0000 meq | INTRAVENOUS | Status: DC
  Administered 2013-10-06 (×2): 10 meq via INTRAVENOUS
  Filled 2013-10-06 (×5): qty 100

## 2013-10-06 MED ORDER — ONDANSETRON HCL 4 MG PO TABS: 4.0000 mg | ORAL_TABLET | Freq: Four times a day (QID) | ORAL | Status: DC | PRN

## 2013-10-06 MED ORDER — OXYCODONE HCL 5 MG PO TABS: 5.0000 mg | ORAL_TABLET | ORAL | Status: DC | PRN

## 2013-10-06 NOTE — Discharge Summary (Signed)
Physician Discharge Summary  Suzanne Allen [id number] DOB: 1960-10-20 DOA: 10/04/2013  PCP: Default, Provider, MD  Admit date: 10/04/2013 Discharge date: 10/06/2013  Time spent: 45 minutes  Recommendations for Outpatient Follow-up:  -Will be discharged home today. -Advised to follow up with PCP in 2 weeks.   Discharge Diagnoses:  Principal Problem:   Pancreatitis, acute Active Problems:   Anxiety   Depression   Diabetes type 1, uncontrolled   Leukocytosis, unspecified   Acute pancreatitis   Discharge Condition: Stable and improved  Filed Weights   10/04/13 0659 10/04/13 1100  Weight: 71.215 kg (157 lb) 71.5 kg (157 lb 10.1 oz)    History of present illness:  Patient is a 53 year old white lady with past medical history significant for anxiety, depression, hypothyroidism, hypertension who presents to the hospital today with the above-mentioned complaints. She states the nausea began at about 14 hours ago. She has vomited numerous times since then. She then started complaining of epigastric and left upper quadrant abdominal pain radiating to her back in a belt-like fashion. Because of her inability to control her symptoms at home she came to the emergency department where she was found to have a lipase of around 600, LFTs within normal limits. We were asked to admit her for further evaluation and management. She denies alcohol use.   Hospital Course:   Acute Pancreatitis  -Symptomatically improved.  -Tolerating a solid diet without issue.  IDDM  -Continue home doses of insulin. -Will likely need to be further adjusted in the OP setting.  Anxiety/Depression  -Continue home meds.   Procedures:  None   Consultations:  None  Discharge Instructions  Discharge Orders   Future Orders Complete By Expires   Diet - low sodium heart healthy  As directed    Discontinue IV  As directed    Increase activity slowly  As directed        Medication List    STOP  taking these medications       aspirin 325 MG tablet     Bilberry 1000 MG Caps      TAKE these medications       clonazePAM 0.5 MG tablet  Commonly known as:  KLONOPIN  Take 0.5 mg by mouth at bedtime.     DULoxetine 30 MG capsule  Commonly known as:  CYMBALTA  Take 90 mg by mouth daily after breakfast.     insulin aspart 100 UNIT/ML injection  Commonly known as:  novoLOG  Inject 40 Units into the skin 3 (three) times daily before meals.     insulin glargine 100 UNIT/ML injection  Commonly known as:  LANTUS  Inject 40 Units into the skin 2 (two) times daily.     levothyroxine 137 MCG tablet  Commonly known as:  SYNTHROID, LEVOTHROID  Take 137 mcg by mouth daily.     lisinopril-hydrochlorothiazide 20-25 MG per tablet  Commonly known as:  PRINZIDE,ZESTORETIC  Take 1 tablet by mouth daily.     ondansetron 4 MG tablet  Commonly known as:  ZOFRAN  Take 1 tablet (4 mg total) by mouth every 6 (six) hours as needed for nausea.     oxyCODONE 5 MG immediate release tablet  Commonly known as:  Oxy IR/ROXICODONE  Take 1 tablet (5 mg total) by mouth every 4 (four) hours as needed for severe pain.     simvastatin 20 MG tablet  Commonly known as:  ZOCOR  Take 20 mg by mouth at bedtime.  Allergies  Allergen Reactions  . Erythromycin Anaphylaxis  . Vancomycin Other (See Comments)    Red Man Syndrome       Follow-up Information   Follow up with Default, Provider, MD. Schedule an appointment as soon as possible for a visit in 2 weeks.       The results of significant diagnostics from this hospitalization (including imaging, microbiology, ancillary and laboratory) are listed below for reference.    Significant Diagnostic Studies: US Abdomen Complete  10/04/2013   CLINICAL DATA:  Abdominal pain  EXAM: ULTRASOUND ABDOMEN COMPLETE  COMPARISON:  CT ABD/PELVIS W CM dated 02/20/2011  FINDINGS: Gallbladder:  No gallstones or wall thickening visualized. No sonographic Murphy  sign noted.  Common bile duct:  Diameter: 4 mm  Liver:  Hepatic hypodensity likely indicates underlying steatosis without focal abnormality or intrahepatic ductal dilatation.  IVC:  No abnormality visualized.  Pancreas:  Visualized portion unremarkable.  Spleen:  Size and appearance within normal limits.  Right Kidney:  Length: 12.2 cm. Echogenicity within normal limits. No mass or hydronephrosis visualized.  Left Kidney:  Length: 11.3 cm. Echogenicity within normal limits. No mass or hydronephrosis visualized.  Abdominal aorta:  No aneurysm visualized.  Other findings:  None.  IMPRESSION: No acute intra-abdominal abnormality.   Electronically Signed   By: Conchita Paris M.D.   On: 10/04/2013 09:09    Microbiology: Recent Results (from the past 240 hour(s))  CLOSTRIDIUM DIFFICILE BY PCR     Status: None   Collection Time    10/05/13 10:12 PM      Result Value Ref Range Status   C difficile by pcr NEGATIVE  NEGATIVE Final   Comment: Performed at [facility]: Basic Metabolic Panel:  Recent Labs Lab 10/04/13 0725 10/05/13 0435 10/06/13 0410  NA 131* 135* 141  K 3.8 3.2* 3.1*  CL 90* 96 107  CO2 23 25 22   GLUCOSE 545* 157* 88  BUN 20 16 8   CREATININE 0.60 0.70 0.60  CALCIUM 9.4 7.7* 7.1*  MG  --   --  1.6   Liver Function Tests:  Recent Labs Lab 10/04/13 0725  AST 17  ALT 22  ALKPHOS 117  BILITOT 0.6  PROT 7.5  ALBUMIN 3.5    Recent Labs Lab 10/04/13 0725  LIPASE 597*   No results found for this basename: AMMONIA,  in the last 168 hours CBC:  Recent Labs Lab 10/04/13 0725 10/05/13 0435 10/06/13 0410  WBC 17.2* 8.5 9.2  NEUTROABS 15.1*  --   --   HGB 14.8 12.8 12.0  HCT 41.6 37.9 35.0*  MCV 87.2 89.8 89.3  PLT 328 315 276   Cardiac Enzymes: No results found for this basename: CKTOTAL, CKMB, CKMBINDEX, TROPONINI,  in the last 168 hours BNP: BNP (last 3 results) No results found for this basename: PROBNP,  in the last 8760  hours CBG:  Recent Labs Lab 10/05/13 1134 10/05/13 1618 10/05/13 2116 10/06/13 0732 10/06/13 1126  GLUCAP 113* 72 108* 75 82       Signed:  HERNANDEZ ACOSTA,ESTELA  [facility] Pager: [phone] 10/06/2013, 3:48 PM

## 2013-10-06 NOTE — Progress Notes (Signed)
Pt discharged home in stable condition, no change from morning assessment.  Instructions and script provided to pt with verbal understanding.  No further questions

## 2013-10-10 DIAGNOSIS — K859 Acute pancreatitis without necrosis or infection, unspecified: Secondary | ICD-10-CM | POA: Insufficient documentation

## 2013-10-10 DIAGNOSIS — E119 Type 2 diabetes mellitus without complications: Secondary | ICD-10-CM | POA: Insufficient documentation

## 2013-10-30 ENCOUNTER — Ambulatory Visit (INDEPENDENT_AMBULATORY_CARE_PROVIDER_SITE_OTHER): Payer: Medicaid Other | Admitting: Psychiatry

## 2013-10-30 ENCOUNTER — Encounter (HOSPITAL_COMMUNITY): Payer: Self-pay | Admitting: Psychiatry

## 2013-10-30 VITALS — BP 122/81 | HR 111 | Ht 63.0 in | Wt 150.4 lb

## 2013-10-30 DIAGNOSIS — F329 Major depressive disorder, single episode, unspecified: Secondary | ICD-10-CM

## 2013-10-30 DIAGNOSIS — F4310 Post-traumatic stress disorder, unspecified: Secondary | ICD-10-CM

## 2013-10-30 DIAGNOSIS — F3289 Other specified depressive episodes: Secondary | ICD-10-CM

## 2013-10-30 DIAGNOSIS — F419 Anxiety disorder, unspecified: Secondary | ICD-10-CM

## 2013-10-30 MED ORDER — DULOXETINE HCL 30 MG PO CPEP: 90.0000 mg | ORAL_CAPSULE | Freq: Every day | ORAL | Status: DC

## 2013-10-30 MED ORDER — CLONAZEPAM 0.5 MG PO TABS: 0.5000 mg | ORAL_TABLET | Freq: Every day | ORAL | Status: DC

## 2013-10-30 NOTE — Progress Notes (Signed)
[facility] [phone] Progress Note  Laniya Mazak [id number] y.o.  10/30/2013 11:52 AM  Chief Complaint:  Medication management and followup.    History of Present Illness: Suzanne Allen came for her followup appointment.  The patient was admitted because of acute pancreatitis .  Patient is feeling better now.  She is disappointed because she developed infection again in her bones .  She seemed  specialist at [facility] because she was not satisfied with her current provider.  She is taking antibiotic.  She continues to have anxiety and nervousness but denies any recent panic attack.  She states Klonopin mostly today and sometime at bedtime.  She is sleeping okay.  She has another treatment for her eyes in February .  The patient has chronic health issues.  However she believed that Cymbalta is working very well for her.  She wants to continue her current psychotropic medication.  Her appetite and sleep is unchanged from the past.  She is not drinking or using illicit substances.    Suicidal Ideation: No Plan Formed: No Patient has means to carry out plan: No  Homicidal Ideation: No Plan Formed: No Patient has means to carry out plan: No  Review of Systems: Psychiatric: Agitation: No Hallucination: No Depressed Mood: No Insomnia: No Hypersomnia: No Altered Concentration: No Feels Worthless: No Grandiose Ideas: No Belief In Special Powers: No New/Increased Substance Abuse: No Compulsions: No  Neurologic: Headache: No Seizure: No Paresthesias: Diabetic neuropathy in both feet  Medical History:   Patient has history of hypothyroidism, insulin-dependent diabetes, diabetic neuropathy, hypertension macular degeneration.    Outpatient Encounter Prescriptions as of 10/30/2013  Medication Sig  . clonazePAM (KLONOPIN) 0.5 MG tablet Take 1 tablet (0.5 mg total) by mouth at bedtime.  . DULoxetine (CYMBALTA) 30 MG capsule Take 3 capsules (90 mg total) by mouth daily after breakfast.   . insulin aspart (NOVOLOG) 100 UNIT/ML injection Inject 40 Units into the skin 3 (three) times daily before meals.  . insulin glargine (LANTUS) 100 UNIT/ML injection Inject 40 Units into the skin 2 (two) times daily.  Marland Kitchen levothyroxine (SYNTHROID, LEVOTHROID) 137 MCG tablet Take 137 mcg by mouth daily.    Marland Kitchen lisinopril-hydrochlorothiazide (PRINZIDE,ZESTORETIC) 20-25 MG per tablet Take 1 tablet by mouth daily.   . ondansetron (ZOFRAN) 4 MG tablet Take 1 tablet (4 mg total) by mouth every 6 (six) hours as needed for nausea.  Marland Kitchen oxyCODONE (OXY IR/ROXICODONE) 5 MG immediate release tablet Take 1 tablet (5 mg total) by mouth every 4 (four) hours as needed for severe pain.  . simvastatin (ZOCOR) 20 MG tablet Take 20 mg by mouth at bedtime.  . [DISCONTINUED] clonazePAM (KLONOPIN) 0.5 MG tablet Take 0.5 mg by mouth at bedtime.  . [DISCONTINUED] DULoxetine (CYMBALTA) 30 MG capsule Take 90 mg by mouth daily after breakfast.    Past Psychiatric History/Hospitalization(s): Patient has history of depression and posttraumatic stress disorder for past 16 years. Patient husband committed suicide in 1996. She witnessed when she saw his body hanging. Patient has taken in the past Effexor Prozac and Celexa. Patient endorse history of severe flashback, hearing her husband voice and nightmares. She denies any history of previous suicidal attempt. Patient denies any previous inpatient psychiatric treatment.   Anxiety: Yes Bipolar Disorder: No Depression: Yes Mania: No Psychosis: No Schizophrenia: No Personality Disorder: No Hospitalization for psychiatric illness: No History of Electroconvulsive Shock Therapy: No Prior Suicide Attempts: No  Physical Exam: Constitutional:  BP 122/81  Pulse 111  Ht 5\' 3"  (  1.6 m)  Wt 150 lb 6.4 oz (68.221 kg)  BMI 26.65 kg/m2  General Appearance: Patient has difficulty walking.  She is wearing cast.   Musculoskeletal: Strength & Muscle Tone: Pain in her foot Gait &  Station: Difficulty in walking due to pain Patient leans: N/A  Psychiatric: Speech (describe rate, volume, coherence, spontaneity, and abnormalities if any):  Clear and coherent.  Thought Process (describe rate, content, abstract reasoning, and computation):  Organized logical and goal-directed.  Associations: Coherent and Intact  Thoughts: normal  Mental Status: Orientation: oriented to person, place, time/date and situation Mood & Affect: anxiety Attention Span & Concentration:  Good  Established Problem, Stable/Improving (1), Review of Last Therapy Session (1) and Review of New Medication or Change in Dosage (2)  Assessment: Axis I:  Depressive disorder NOS, posttraumatic stress disorder  Axis II:  Deferred  Axis III:  Patient Active Problem List   Diagnosis Date Noted  . Pancreatitis, acute 10/04/2013  . Leukocytosis, unspecified 10/04/2013  . Acute pancreatitis 10/04/2013  . TIA (transient ischemic attack) 09/23/2012  . Diabetic osteomyelitis 08/29/2012  . Diabetes type 1, uncontrolled 08/29/2012  . Anxiety 08/24/2011  . Depression 08/24/2011    Axis IV:  Mild to moderate  Axis V:  60-65   Plan:  Patient is fairly stable on her current psychotropic medication.  Continue Klonopin 0.5 mg in the morning and bedtime only if needed.  Continue Cymbalta 90 mg daily.  Recommend to call us back if she has any question or any concern.  Follow up in 3 months.   ARFEEN,SYED T., MD 10/30/2013

## 2013-11-17 ENCOUNTER — Other Ambulatory Visit (HOSPITAL_COMMUNITY): Payer: Self-pay | Admitting: Psychiatry

## 2013-11-17 ENCOUNTER — Telehealth (HOSPITAL_COMMUNITY): Payer: Self-pay

## 2013-11-17 DIAGNOSIS — F419 Anxiety disorder, unspecified: Secondary | ICD-10-CM

## 2013-11-17 NOTE — Telephone Encounter (Signed)
Called pharmacy. Insurance allows only 30 pills a day. Will change direction to two a day as needed. Tab 45 per month.

## 2013-11-22 ENCOUNTER — Telehealth (HOSPITAL_COMMUNITY): Payer: Self-pay | Admitting: *Deleted

## 2013-11-22 NOTE — Telephone Encounter (Signed)
Patient left [phone] earlier in week about how Clonazepam was cut in half by MD without her knowing it.Used to get 60 pills,now only got 30.Having half her foot amputated and needs prescription for 60. Contacted patient: Informed pt  per note from MD on 5/8:"Called pharmacy. Insurance allows only 30 pills a day. Will change direction to two a day as needed. Tab 45 per month."Pt states she will call pharamcy to find out what is going on-she was not aware insurance changed

## 2014-01-29 ENCOUNTER — Encounter (HOSPITAL_COMMUNITY): Payer: Self-pay | Admitting: Psychiatry

## 2014-01-29 ENCOUNTER — Ambulatory Visit (INDEPENDENT_AMBULATORY_CARE_PROVIDER_SITE_OTHER): Payer: Medicaid Other | Admitting: Psychiatry

## 2014-01-29 VITALS — BP 126/80 | HR 92 | Ht 63.0 in | Wt 158.4 lb

## 2014-01-29 DIAGNOSIS — F3289 Other specified depressive episodes: Secondary | ICD-10-CM

## 2014-01-29 DIAGNOSIS — F329 Major depressive disorder, single episode, unspecified: Secondary | ICD-10-CM

## 2014-01-29 DIAGNOSIS — F411 Generalized anxiety disorder: Secondary | ICD-10-CM

## 2014-01-29 DIAGNOSIS — F4310 Post-traumatic stress disorder, unspecified: Secondary | ICD-10-CM

## 2014-01-29 MED ORDER — CLONAZEPAM 0.5 MG PO TABS: ORAL_TABLET | ORAL | Status: DC

## 2014-01-29 MED ORDER — DULOXETINE HCL 30 MG PO CPEP: 90.0000 mg | ORAL_CAPSULE | Freq: Every day | ORAL | Status: DC

## 2014-01-29 MED ORDER — CLONAZEPAM 0.5 MG PO TABS: 0.5000 mg | ORAL_TABLET | Freq: Every day | ORAL | Status: DC

## 2014-01-29 NOTE — Progress Notes (Signed)
[facility] Progress Note  Suzanne Allen [id number] 53 y.o.  01/29/2014 10:59 AM  Chief Complaint:  Medication management and followup.    History of Present Illness: Suzanne Allen came for her followup appointment.  She is taking Klonopin 0.5 mg in the morning and 1 mg at bedtime.  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  She was very anxious and she had panic attack at that time but now she is feeling better.  She slowly understanding that she has a chronic illness which requires some time aggressive treatment.  She stayed Cymbalta 90 mg.  She is sleeping better.  She denies any agitation, anger, mood swing.  She endorsed stressful living situation because her father has dementia and her daughter is disabled.  She is trying to balance her life and giving enough time to both .  She is also worried about her eyes .  Recently she's seen her eye specialist but there were no changes in her medication.  Patient denies any paranoia, hallucination.  She denies any nightmares, flashbacks. She has no side effects of medication.  Her appetite is okay.  Her vitals are stable.  She is not drinking.  She is not using any illegal substances.  Suicidal Ideation: No Plan Formed: No Patient has means to carry out plan: No  Homicidal Ideation: No Plan Formed: No Patient has means to carry out plan: No  Review of Systems: Psychiatric: Agitation: No Hallucination: No Depressed Mood: No Insomnia: No Hypersomnia: No Altered Concentration: No Feels Worthless: No Grandiose Ideas: No Belief In Special Powers: No New/Increased Substance Abuse: No Compulsions: No  Neurologic: Headache: No Seizure: No Paresthesias: Diabetic neuropathy in both feet  Medical History:   Patient has history of hypothyroidism, insulin-dependent diabetes, diabetic neuropathy, hypertension macular degeneration.    Outpatient Encounter Prescriptions as of 01/29/2014  Medication Sig  . clonazePAM (KLONOPIN) 0.5 MG  tablet Take 1/2 in am and 1 tab at bed time  . DULoxetine (CYMBALTA) 30 MG capsule Take 3 capsules (90 mg total) by mouth daily after breakfast.  . [DISCONTINUED] clonazePAM (KLONOPIN) 0.5 MG tablet Take 1 tablet (0.5 mg total) by mouth at bedtime.  . [DISCONTINUED] clonazePAM (KLONOPIN) 0.5 MG tablet Take 1 tablet (0.5 mg total) by mouth at bedtime.  . [DISCONTINUED] clonazePAM (KLONOPIN) 0.5 MG tablet Take 1 tablet (0.5 mg total) by mouth at bedtime.  . [DISCONTINUED] DULoxetine (CYMBALTA) 30 MG capsule Take 3 capsules (90 mg total) by mouth daily after breakfast.  . insulin aspart (NOVOLOG) 100 UNIT/ML injection Inject 40 Units into the skin 3 (three) times daily before meals.  . insulin glargine (LANTUS) 100 UNIT/ML injection Inject 40 Units into the skin 2 (two) times daily.  Marland Kitchen levothyroxine (SYNTHROID, LEVOTHROID) 137 MCG tablet Take 137 mcg by mouth daily.    Marland Kitchen lisinopril-hydrochlorothiazide (PRINZIDE,ZESTORETIC) 20-25 MG per tablet Take 1 tablet by mouth daily.   . simvastatin (ZOCOR) 20 MG tablet Take 20 mg by mouth at bedtime.  . [DISCONTINUED] ondansetron (ZOFRAN) 4 MG tablet Take 1 tablet (4 mg total) by mouth every 6 (six) hours as needed for nausea.  . [DISCONTINUED] oxyCODONE (OXY IR/ROXICODONE) 5 MG immediate release tablet Take 1 tablet (5 mg total) by mouth every 4 (four) hours as needed for severe pain.    Past Psychiatric History/Hospitalization(s): Patient has history of depression, anxiety and PTSD. Patient husband committed suicide in 1996. She witnessed when she saw his body hanging. Patient has taken in the past Effexor Prozac  and Celexa.  Anxiety: Yes Bipolar Disorder: No Depression: Yes Mania: No Psychosis: No Schizophrenia: No Personality Disorder: No Hospitalization for psychiatric illness: No History of Electroconvulsive Shock Therapy: No Prior Suicide Attempts: No  Physical Exam: Constitutional:  BP 126/80  Pulse 92  Ht 5\' 3"  (1.6 m)  Wt 158 lb 6.4 oz  (71.85 kg)  BMI 28.07 kg/m2  General Appearance: Patient has difficulty walking.  She is wearing cast.   Musculoskeletal: Strength & Muscle Tone: Pain in her foot Gait & Station: Difficulty in walking due to pain Patient leans: N/A  Psychiatric: Speech (describe rate, volume, coherence, spontaneity, and abnormalities if any):  Clear and coherent.  Thought Process (describe rate, content, abstract reasoning, and computation):  Organized logical and goal-directed.  Associations: Coherent and Intact  Thoughts: normal  Mental Status: Orientation: oriented to person, place, time/date and situation Mood & Affect: anxiety Attention Span & Concentration:  Good  Established Problem, Stable/Improving (1), Review of Last Therapy Session (1) and Review of New Medication or Change in Dosage (2)  Assessment: Axis I:  Depressive disorder NOS, posttraumatic stress disorder  Axis II:  Deferred  Axis III:  Patient Active Problem List   Diagnosis Date Noted  . Pancreatitis, acute 10/04/2013  . Leukocytosis, unspecified 10/04/2013  . Acute pancreatitis 10/04/2013  . TIA (transient ischemic attack) 09/23/2012  . Diabetic osteomyelitis 08/29/2012  . Diabetes type 1, uncontrolled 08/29/2012  . Anxiety 08/24/2011  . Depression 08/24/2011    Axis IV:  Mild to moderate  Axis V:  60-65   Plan:  Patient is fairly stable on her current psychotropic medication.  Continue Klonopin 0.5 mg in the morning and 1 mg at bedtime and Cymbalta 90 mg daily.  I offered counseling but patient declined.  Recommend to call us back if she has any question or any concern.  Follow up in 3 months.   Suzanne Allen T., MD 01/29/2014

## 2014-03-21 ENCOUNTER — Telehealth (HOSPITAL_COMMUNITY): Payer: Self-pay

## 2014-03-21 ENCOUNTER — Telehealth (HOSPITAL_COMMUNITY): Payer: Self-pay | Admitting: Psychiatry

## 2014-03-21 ENCOUNTER — Telehealth (HOSPITAL_COMMUNITY): Payer: Self-pay | Admitting: *Deleted

## 2014-03-21 NOTE — Telephone Encounter (Signed)
I returned patient's phone call I left a message.

## 2014-03-21 NOTE — Telephone Encounter (Signed)
Patient left [phone] to know how to stop Cymbalta without experiencing severe side effects.Wants to know safest way..Medicaid will end 04/11/14.Will not be able to afford either MD visit or medication.

## 2014-03-26 ENCOUNTER — Telehealth (HOSPITAL_COMMUNITY): Payer: Self-pay | Admitting: *Deleted

## 2014-03-26 NOTE — Telephone Encounter (Signed)
Patient had left VM requesting best way to stop Cymbalta as financial reasons will prevent her from filling RX or seeing MD.  Per Eudelia Bunch, NP (in Dr. Marguerite Olea absence),patient can decrease Cymbalta from 90 mg the first week to 60 mg, then decrease to 30 mg the next week, then discontinue. Encouraged her to continue mental health care with [facility] or apply for financial aide with [facility].

## 2014-04-01 ENCOUNTER — Encounter (HOSPITAL_COMMUNITY): Payer: Self-pay | Admitting: Emergency Medicine

## 2014-04-01 ENCOUNTER — Inpatient Hospital Stay (HOSPITAL_COMMUNITY)
Admission: EM | Admit: 2014-04-01 | Discharge: 2014-04-10 | DRG: 438 | Disposition: A | Discharge status: Home / Self Care | Payer: Medicaid Other | Attending: Internal Medicine | Admitting: Internal Medicine

## 2014-04-01 DIAGNOSIS — Z794 Long term (current) use of insulin: Secondary | ICD-10-CM

## 2014-04-01 DIAGNOSIS — R1013 Epigastric pain: Secondary | ICD-10-CM | POA: Diagnosis present

## 2014-04-01 DIAGNOSIS — K760 Fatty (change of) liver, not elsewhere classified: Secondary | ICD-10-CM

## 2014-04-01 DIAGNOSIS — K7689 Other specified diseases of liver: Secondary | ICD-10-CM | POA: Diagnosis present

## 2014-04-01 DIAGNOSIS — R748 Abnormal levels of other serum enzymes: Secondary | ICD-10-CM | POA: Diagnosis present

## 2014-04-01 DIAGNOSIS — R7989 Other specified abnormal findings of blood chemistry: Secondary | ICD-10-CM | POA: Diagnosis present

## 2014-04-01 DIAGNOSIS — E039 Hypothyroidism, unspecified: Secondary | ICD-10-CM | POA: Diagnosis present

## 2014-04-01 DIAGNOSIS — F4310 Post-traumatic stress disorder, unspecified: Secondary | ICD-10-CM | POA: Diagnosis present

## 2014-04-01 DIAGNOSIS — E1169 Type 2 diabetes mellitus with other specified complication: Secondary | ICD-10-CM

## 2014-04-01 DIAGNOSIS — F3289 Other specified depressive episodes: Secondary | ICD-10-CM | POA: Diagnosis present

## 2014-04-01 DIAGNOSIS — F411 Generalized anxiety disorder: Secondary | ICD-10-CM | POA: Diagnosis present

## 2014-04-01 DIAGNOSIS — R591 Generalized enlarged lymph nodes: Secondary | ICD-10-CM | POA: Diagnosis present

## 2014-04-01 DIAGNOSIS — F419 Anxiety disorder, unspecified: Secondary | ICD-10-CM | POA: Diagnosis present

## 2014-04-01 DIAGNOSIS — M869 Osteomyelitis, unspecified: Secondary | ICD-10-CM

## 2014-04-01 DIAGNOSIS — J810 Acute pulmonary edema: Secondary | ICD-10-CM | POA: Diagnosis present

## 2014-04-01 DIAGNOSIS — Z79899 Other long term (current) drug therapy: Secondary | ICD-10-CM

## 2014-04-01 DIAGNOSIS — Z885 Allergy status to narcotic agent status: Secondary | ICD-10-CM

## 2014-04-01 DIAGNOSIS — E785 Hyperlipidemia, unspecified: Secondary | ICD-10-CM | POA: Diagnosis present

## 2014-04-01 DIAGNOSIS — K859 Acute pancreatitis without necrosis or infection, unspecified: Principal | ICD-10-CM | POA: Diagnosis present

## 2014-04-01 DIAGNOSIS — M5136 Other intervertebral disc degeneration, lumbar region: Secondary | ICD-10-CM | POA: Diagnosis present

## 2014-04-01 DIAGNOSIS — Z881 Allergy status to other antibiotic agents status: Secondary | ICD-10-CM

## 2014-04-01 DIAGNOSIS — K8050 Calculus of bile duct without cholangitis or cholecystitis without obstruction: Secondary | ICD-10-CM | POA: Diagnosis present

## 2014-04-01 DIAGNOSIS — R945 Abnormal results of liver function studies: Secondary | ICD-10-CM

## 2014-04-01 DIAGNOSIS — F32A Depression, unspecified: Secondary | ICD-10-CM | POA: Diagnosis present

## 2014-04-01 DIAGNOSIS — R599 Enlarged lymph nodes, unspecified: Secondary | ICD-10-CM

## 2014-04-01 DIAGNOSIS — E1065 Type 1 diabetes mellitus with hyperglycemia: Secondary | ICD-10-CM | POA: Diagnosis present

## 2014-04-01 DIAGNOSIS — K858 Other acute pancreatitis without necrosis or infection: Secondary | ICD-10-CM

## 2014-04-01 DIAGNOSIS — E876 Hypokalemia: Secondary | ICD-10-CM | POA: Diagnosis present

## 2014-04-01 DIAGNOSIS — I10 Essential (primary) hypertension: Secondary | ICD-10-CM | POA: Diagnosis present

## 2014-04-01 DIAGNOSIS — E7800 Pure hypercholesterolemia, unspecified: Secondary | ICD-10-CM | POA: Diagnosis present

## 2014-04-01 DIAGNOSIS — IMO0002 Reserved for concepts with insufficient information to code with codable children: Secondary | ICD-10-CM | POA: Diagnosis present

## 2014-04-01 DIAGNOSIS — H3530 Unspecified macular degeneration: Secondary | ICD-10-CM | POA: Diagnosis present

## 2014-04-01 DIAGNOSIS — R197 Diarrhea, unspecified: Secondary | ICD-10-CM | POA: Diagnosis present

## 2014-04-01 DIAGNOSIS — Z818 Family history of other mental and behavioral disorders: Secondary | ICD-10-CM

## 2014-04-01 DIAGNOSIS — F329 Major depressive disorder, single episode, unspecified: Secondary | ICD-10-CM | POA: Diagnosis present

## 2014-04-01 DIAGNOSIS — M5030 Other cervical disc degeneration, unspecified cervical region: Secondary | ICD-10-CM | POA: Diagnosis present

## 2014-04-01 HISTORY — DX: Fatty (change of) liver, not elsewhere classified: K76.0

## 2014-04-01 HISTORY — DX: Generalized enlarged lymph nodes: R59.1

## 2014-04-01 LAB — CBG MONITORING, ED: Glucose-Capillary: 258 mg/dL — ABNORMAL HIGH (ref 70–99)

## 2014-04-01 MED ORDER — FENTANYL CITRATE 0.05 MG/ML IJ SOLN
100.0000 ug | Freq: Once | INTRAMUSCULAR | Status: AC
  Administered 2014-04-02: 100 ug via INTRAVENOUS
  Filled 2014-04-01: qty 2

## 2014-04-01 MED ORDER — SODIUM CHLORIDE 0.9 % IV SOLN
INTRAVENOUS | Status: DC
  Administered 2014-04-02: 01:00:00 via INTRAVENOUS

## 2014-04-01 MED ORDER — SODIUM CHLORIDE 0.9 % IV BOLUS (SEPSIS)
1000.0000 mL | Freq: Once | INTRAVENOUS | Status: AC
  Administered 2014-04-02: 1000 mL via INTRAVENOUS

## 2014-04-01 MED ORDER — ONDANSETRON HCL 4 MG/2ML IJ SOLN
4.0000 mg | Freq: Once | INTRAMUSCULAR | Status: AC
  Administered 2014-04-02: 4 mg via INTRAVENOUS
  Filled 2014-04-01: qty 2

## 2014-04-01 NOTE — ED Notes (Signed)
Pt arrives to the ER via EMS for complaints of abd pain; pt states that she has not felt well all day but began having severe abd pain and vomiting approx an hour ago; pt states that she has vomited approx 10 times in the last hour; pt states that she has had pancreatitis in the past and that this feels similar; pt denies diarrhea

## 2014-04-01 NOTE — ED Notes (Signed)
Bed: [facility]4 Expected date: 04/01/14 Expected time: 9:57 PM Means of arrival: Ambulance Comments: pancreatitis

## 2014-04-01 NOTE — ED Provider Notes (Addendum)
CSN: [id number]     Arrival date & time 04/01/14  2211 History   First MD Initiated Contact with Patient 04/01/14 2341     Chief Complaint  Patient presents with  . Abdominal Pain     (Consider location/radiation/quality/duration/timing/severity/associated sxs/prior Treatment) HPI This is a 53 year old insulin-dependent diabetic with a history of a single episode of pancreatitis about a year ago. She is here with epigastric pain that began this morning and is worsened throughout the day. It is now severe and worse with movement, palpation or deep breathing. It has been accompanied by multiple episodes of vomiting. She has not had diarrhea. She is not short of breath but states it is difficult to breathe due to the pain.  She had reconstructive surgery to her right foot 2 days ago due to chronic diabetic problems. She has a history of osteomyelitis in that foot.   Past Medical History  Diagnosis Date  . HTN (hypertension)   . Macular degeneration, bilateral   . Hypercholesteremia   . Hypothyroidism   . Diabetes mellitus type I   . Migraines 1980's    "when I was in my 20's" (09/23/2012)  . Stroke ? date & 09/22/2012  . Peripheral neuropathy   . Anxiety   . Diabetic osteomyelitis     right great toe/progress notes 09/23/2012  . Depression   . PTSD (post-traumatic stress disorder)    Past Surgical History  Procedure Laterality Date  . Cesarean section  1989; 1995  . Tonsillectomy  1965  . Refractive surgery Bilateral 2013  . Total thyroidectomy  2012  . Peripherally inserted central catheter insertion Right 08/31/2012  . I&d extremity Right     "big toe"; multiple  . Toe amputation     Family History  Problem Relation Age of Onset  . Depression Mother   . Alcohol abuse Father   . Alcohol abuse Sister    History  Substance Use Topics  . Smoking status: Never Smoker   . Smokeless tobacco: Never Used  . Alcohol Use: No   OB History   Grav Para Term Preterm Abortions TAB  SAB Ect Mult Living                 Review of Systems  All other systems reviewed and are negative.   Allergies  Erythromycin; Morphine and related; and Vancomycin  Home Medications   Prior to Admission medications   Medication Sig Start Date End Date Taking? Authorizing Provider  clonazePAM (KLONOPIN) 0.5 MG tablet Take 0.25 mg by mouth 2 (two) times daily as needed for anxiety.   Yes Historical Provider, MD  DULoxetine (CYMBALTA) 30 MG capsule Take 90 mg by mouth daily.   Yes Historical Provider, MD  insulin aspart (NOVOLOG) 100 UNIT/ML injection Inject 40 Units into the skin 3 (three) times daily before meals.   Yes Historical Provider, MD  insulin glargine (LANTUS) 100 UNIT/ML injection Inject 40 Units into the skin 2 (two) times daily.   Yes Historical Provider, MD  levothyroxine (SYNTHROID, LEVOTHROID) 137 MCG tablet Take 137 mcg by mouth daily.     Yes Historical Provider, MD  lisinopril-hydrochlorothiazide (PRINZIDE,ZESTORETIC) 20-25 MG per tablet Take 1 tablet by mouth daily.    Yes Historical Provider, MD  simvastatin (ZOCOR) 20 MG tablet Take 20 mg by mouth at bedtime.   Yes Historical Provider, MD   BP 100/56  Pulse 76  Temp(Src) 97.7 F (36.5 C) (Oral)  Resp 18  SpO2 94%  Physical Exam General:  Well-developed, well-nourished female in no acute distress; appearance consistent with age of record HENT: normocephalic; atraumatic Eyes: pupils equal, round and reactive to light; extraocular muscles intact Neck: supple Heart: regular rate and rhythm Lungs: clear to auscultation bilaterally Abdomen: soft; nondistended; fierce epigastric tenderness, mild to moderate suprapubic tenderness; no masses or hepatosplenomegaly; bowel sounds hypoactive Extremities: No deformity; full range of motion; pulses normal; right foot in postoperative bandage and orthotic Neurologic: Awake, alert and oriented; motor function intact in all extremities and symmetric; no facial droop Skin:  Warm and dry Psychiatric: Flat affect    ED Course  Procedures (including critical care time)  MDM   Nursing notes and vitals signs, including pulse oximetry, reviewed.  Summary of this visit's results, reviewed by myself:  Labs:  Results for orders placed during the hospital encounter of 04/01/14 (from the past 24 hour(s))  COMPREHENSIVE METABOLIC PANEL     Status: Abnormal   Collection Time    04/01/14 11:52 PM      Result Value Ref Range   Sodium 137  137 - 147 mEq/L   Potassium 4.5  3.7 - 5.3 mEq/L   Chloride 96  96 - 112 mEq/L   CO2 28  19 - 32 mEq/L   Glucose, Bld 258 (*) 70 - 99 mg/dL   BUN 14  6 - 23 mg/dL   Creatinine, Ser 0.70  0.50 - 1.10 mg/dL   Calcium 9.7  8.4 - 10.5 mg/dL   Total Protein 8.3  6.0 - 8.3 g/dL   Albumin 3.7  3.5 - 5.2 g/dL   AST 689 (*) 0 - 37 U/L   ALT 623 (*) 0 - 35 U/L   Alkaline Phosphatase 219 (*) 39 - 117 U/L   Total Bilirubin 1.4 (*) 0.3 - 1.2 mg/dL   GFR calc non Af Amer >90  >90 mL/min   GFR calc Af Amer >90  >90 mL/min   Anion gap 13  5 - 15  CBC WITH DIFFERENTIAL     Status: Abnormal   Collection Time    04/01/14 11:52 PM      Result Value Ref Range   WBC 16.6 (*) 4.0 - 10.5 K/uL   RBC 4.34  3.87 - 5.11 MIL/uL   Hemoglobin 13.5  12.0 - 15.0 g/dL   HCT 39.3  36.0 - 46.0 %   MCV 90.6  78.0 - 100.0 fL   MCH 31.1  26.0 - 34.0 pg   MCHC 34.4  30.0 - 36.0 g/dL   RDW 12.7  11.5 - 15.5 %   Platelets 350  150 - 400 K/uL   Neutrophils Relative % 76  43 - 77 %   Neutro Abs 12.6 (*) 1.7 - 7.7 K/uL   Lymphocytes Relative 13  12 - 46 %   Lymphs Abs 2.2  0.7 - 4.0 K/uL   Monocytes Relative 9  3 - 12 %   Monocytes Absolute 1.5 (*) 0.1 - 1.0 K/uL   Eosinophils Relative 2  0 - 5 %   Eosinophils Absolute 0.3  0.0 - 0.7 K/uL   Basophils Relative 0  0 - 1 %   Basophils Absolute 0.1  0.0 - 0.1 K/uL  LIPASE, BLOOD     Status: Abnormal   Collection Time    04/01/14 11:52 PM      Result Value Ref Range   Lipase >3000 (*) 11 - 59 U/L  CBG  MONITORING, ED     Status: Abnormal   Collection  Time    04/01/14 11:54 PM      Result Value Ref Range   Glucose-Capillary 258 (*) 70 - 99 mg/dL   Comment 1 Notify RN          Wynetta Fines, MD 04/02/14 0234  Wynetta Fines, MD 04/02/14 [phone]

## 2014-04-02 ENCOUNTER — Inpatient Hospital Stay (HOSPITAL_COMMUNITY): Payer: Medicaid Other

## 2014-04-02 ENCOUNTER — Encounter (HOSPITAL_COMMUNITY): Payer: Self-pay | Admitting: Internal Medicine

## 2014-04-02 DIAGNOSIS — R7989 Other specified abnormal findings of blood chemistry: Secondary | ICD-10-CM | POA: Diagnosis present

## 2014-04-02 DIAGNOSIS — F411 Generalized anxiety disorder: Secondary | ICD-10-CM | POA: Diagnosis present

## 2014-04-02 DIAGNOSIS — J810 Acute pulmonary edema: Secondary | ICD-10-CM | POA: Diagnosis present

## 2014-04-02 DIAGNOSIS — R599 Enlarged lymph nodes, unspecified: Secondary | ICD-10-CM | POA: Diagnosis present

## 2014-04-02 DIAGNOSIS — K859 Acute pancreatitis without necrosis or infection, unspecified: Secondary | ICD-10-CM | POA: Diagnosis present

## 2014-04-02 DIAGNOSIS — F4310 Post-traumatic stress disorder, unspecified: Secondary | ICD-10-CM | POA: Diagnosis present

## 2014-04-02 DIAGNOSIS — H3530 Unspecified macular degeneration: Secondary | ICD-10-CM | POA: Diagnosis present

## 2014-04-02 DIAGNOSIS — IMO0002 Reserved for concepts with insufficient information to code with codable children: Secondary | ICD-10-CM | POA: Diagnosis present

## 2014-04-02 DIAGNOSIS — E876 Hypokalemia: Secondary | ICD-10-CM | POA: Diagnosis present

## 2014-04-02 DIAGNOSIS — K7689 Other specified diseases of liver: Secondary | ICD-10-CM | POA: Diagnosis present

## 2014-04-02 DIAGNOSIS — E7800 Pure hypercholesterolemia, unspecified: Secondary | ICD-10-CM | POA: Diagnosis present

## 2014-04-02 DIAGNOSIS — E785 Hyperlipidemia, unspecified: Secondary | ICD-10-CM | POA: Diagnosis present

## 2014-04-02 DIAGNOSIS — E039 Hypothyroidism, unspecified: Secondary | ICD-10-CM | POA: Diagnosis present

## 2014-04-02 DIAGNOSIS — Z818 Family history of other mental and behavioral disorders: Secondary | ICD-10-CM | POA: Diagnosis not present

## 2014-04-02 DIAGNOSIS — Z881 Allergy status to other antibiotic agents status: Secondary | ICD-10-CM | POA: Diagnosis not present

## 2014-04-02 DIAGNOSIS — Z79899 Other long term (current) drug therapy: Secondary | ICD-10-CM | POA: Diagnosis not present

## 2014-04-02 DIAGNOSIS — E1065 Type 1 diabetes mellitus with hyperglycemia: Secondary | ICD-10-CM

## 2014-04-02 DIAGNOSIS — Z885 Allergy status to narcotic agent status: Secondary | ICD-10-CM | POA: Diagnosis not present

## 2014-04-02 DIAGNOSIS — I10 Essential (primary) hypertension: Secondary | ICD-10-CM | POA: Diagnosis present

## 2014-04-02 DIAGNOSIS — R1013 Epigastric pain: Secondary | ICD-10-CM | POA: Diagnosis not present

## 2014-04-02 DIAGNOSIS — F329 Major depressive disorder, single episode, unspecified: Secondary | ICD-10-CM | POA: Diagnosis present

## 2014-04-02 DIAGNOSIS — Z794 Long term (current) use of insulin: Secondary | ICD-10-CM | POA: Diagnosis not present

## 2014-04-02 DIAGNOSIS — R748 Abnormal levels of other serum enzymes: Secondary | ICD-10-CM | POA: Diagnosis present

## 2014-04-02 DIAGNOSIS — F3289 Other specified depressive episodes: Secondary | ICD-10-CM | POA: Diagnosis present

## 2014-04-02 DIAGNOSIS — K8050 Calculus of bile duct without cholangitis or cholecystitis without obstruction: Secondary | ICD-10-CM | POA: Diagnosis present

## 2014-04-02 DIAGNOSIS — R197 Diarrhea, unspecified: Secondary | ICD-10-CM | POA: Diagnosis present

## 2014-04-02 LAB — CBC WITH DIFFERENTIAL/PLATELET
Basophils Absolute: 0 10*3/uL (ref 0.0–0.1)
Basophils Absolute: 0.1 10*3/uL (ref 0.0–0.1)
Basophils Relative: 0 % (ref 0–1)
Basophils Relative: 0 % (ref 0–1)
Eosinophils Absolute: 0.1 10*3/uL (ref 0.0–0.7)
Eosinophils Absolute: 0.3 10*3/uL (ref 0.0–0.7)
Eosinophils Relative: 1 % (ref 0–5)
Eosinophils Relative: 2 % (ref 0–5)
HCT: 36.7 % (ref 36.0–46.0)
HCT: 39.3 % (ref 36.0–46.0)
Hemoglobin: 12.3 g/dL (ref 12.0–15.0)
Hemoglobin: 13.5 g/dL (ref 12.0–15.0)
Lymphocytes Relative: 13 % (ref 12–46)
Lymphocytes Relative: 18 % (ref 12–46)
Lymphs Abs: 2 10*3/uL (ref 0.7–4.0)
Lymphs Abs: 2.2 10*3/uL (ref 0.7–4.0)
MCH: 30.4 pg (ref 26.0–34.0)
MCH: 31.1 pg (ref 26.0–34.0)
MCHC: 33.5 g/dL (ref 30.0–36.0)
MCHC: 34.4 g/dL (ref 30.0–36.0)
MCV: 90.6 fL (ref 78.0–100.0)
MCV: 90.8 fL (ref 78.0–100.0)
Monocytes Absolute: 0.6 10*3/uL (ref 0.1–1.0)
Monocytes Absolute: 1.5 10*3/uL — ABNORMAL HIGH (ref 0.1–1.0)
Monocytes Relative: 5 % (ref 3–12)
Monocytes Relative: 9 % (ref 3–12)
Neutro Abs: 12.6 10*3/uL — ABNORMAL HIGH (ref 1.7–7.7)
Neutro Abs: 8.3 10*3/uL — ABNORMAL HIGH (ref 1.7–7.7)
Neutrophils Relative %: 76 % (ref 43–77)
Neutrophils Relative %: 76 % (ref 43–77)
Platelets: 308 10*3/uL (ref 150–400)
Platelets: 350 10*3/uL (ref 150–400)
RBC: 4.04 MIL/uL (ref 3.87–5.11)
RBC: 4.34 MIL/uL (ref 3.87–5.11)
RDW: 12.6 % (ref 11.5–15.5)
RDW: 12.7 % (ref 11.5–15.5)
WBC: 11 10*3/uL — ABNORMAL HIGH (ref 4.0–10.5)
WBC: 16.6 10*3/uL — ABNORMAL HIGH (ref 4.0–10.5)

## 2014-04-02 LAB — COMPREHENSIVE METABOLIC PANEL
ALT: 623 U/L — ABNORMAL HIGH (ref 0–35)
AST: 689 U/L — ABNORMAL HIGH (ref 0–37)
Albumin: 3.7 g/dL (ref 3.5–5.2)
Alkaline Phosphatase: 219 U/L — ABNORMAL HIGH (ref 39–117)
Anion gap: 13 (ref 5–15)
BUN: 14 mg/dL (ref 6–23)
CO2: 28 mEq/L (ref 19–32)
Calcium: 9.7 mg/dL (ref 8.4–10.5)
Chloride: 96 mEq/L (ref 96–112)
Creatinine, Ser: 0.7 mg/dL (ref 0.50–1.10)
GFR calc Af Amer: >90 mL/min (ref >90)
GFR calc non Af Amer: >90 mL/min (ref >90)
Glucose, Bld: 258 mg/dL — ABNORMAL HIGH (ref 70–99)
Potassium: 4.5 mEq/L (ref 3.7–5.3)
Sodium: 137 mEq/L (ref 137–147)
Total Bilirubin: 1.4 mg/dL — ABNORMAL HIGH (ref 0.3–1.2)
Total Protein: 8.3 g/dL (ref 6.0–8.3)

## 2014-04-02 LAB — BLOOD GAS, VENOUS
Acid-Base Excess: 4.9 mmol/L — ABNORMAL HIGH (ref 0.0–2.0)
Bicarbonate: 30.5 mEq/L — ABNORMAL HIGH (ref 20.0–24.0)
O2 Saturation: 42.5 %
Patient temperature: 98.6
TCO2: 27.5 mmol/L (ref 0–100)
pCO2, Ven: 51 mmHg — ABNORMAL HIGH (ref 45.0–50.0)
pH, Ven: 7.394 — ABNORMAL HIGH (ref 7.250–7.300)

## 2014-04-02 LAB — GLUCOSE, CAPILLARY
Glucose-Capillary: 126 mg/dL — ABNORMAL HIGH (ref 70–99)
Glucose-Capillary: 129 mg/dL — ABNORMAL HIGH (ref 70–99)
Glucose-Capillary: 138 mg/dL — ABNORMAL HIGH (ref 70–99)
Glucose-Capillary: 168 mg/dL — ABNORMAL HIGH (ref 70–99)
Glucose-Capillary: 240 mg/dL — ABNORMAL HIGH (ref 70–99)
Glucose-Capillary: 306 mg/dL — ABNORMAL HIGH (ref 70–99)

## 2014-04-02 LAB — BASIC METABOLIC PANEL
Anion gap: 11 (ref 5–15)
BUN: 14 mg/dL (ref 6–23)
CO2: 26 mEq/L (ref 19–32)
Calcium: 8.1 mg/dL — ABNORMAL LOW (ref 8.4–10.5)
Chloride: 101 mEq/L (ref 96–112)
Creatinine, Ser: 0.62 mg/dL (ref 0.50–1.10)
GFR calc Af Amer: >90 mL/min (ref >90)
GFR calc non Af Amer: >90 mL/min (ref >90)
Glucose, Bld: 287 mg/dL — ABNORMAL HIGH (ref 70–99)
Potassium: 3.9 mEq/L (ref 3.7–5.3)
Sodium: 138 mEq/L (ref 137–147)

## 2014-04-02 LAB — HEPATIC FUNCTION PANEL
ALT: 495 U/L — ABNORMAL HIGH (ref 0–35)
AST: 370 U/L — ABNORMAL HIGH (ref 0–37)
Albumin: 2.8 g/dL — ABNORMAL LOW (ref 3.5–5.2)
Alkaline Phosphatase: 175 U/L — ABNORMAL HIGH (ref 39–117)
Bilirubin, Direct: 0.2 mg/dL (ref 0.0–0.3)
Indirect Bilirubin: 0.6 mg/dL (ref 0.3–0.9)
Total Bilirubin: 0.8 mg/dL (ref 0.3–1.2)
Total Protein: 6.6 g/dL (ref 6.0–8.3)

## 2014-04-02 LAB — HEPATITIS PANEL, ACUTE
HCV Ab: NEGATIVE
Hep A IgM: NONREACTIVE
Hep B C IgM: NONREACTIVE
Hepatitis B Surface Ag: NEGATIVE

## 2014-04-02 LAB — LIPID PANEL
Cholesterol: 168 mg/dL (ref 0–200)
HDL: 43 mg/dL (ref >39)
LDL Cholesterol: 101 mg/dL — ABNORMAL HIGH (ref 0–99)
Total CHOL/HDL Ratio: 3.9 RATIO
Triglycerides: 119 mg/dL (ref <150)
VLDL: 24 mg/dL (ref 0–40)

## 2014-04-02 LAB — ACETAMINOPHEN LEVEL: Acetaminophen (Tylenol), Serum: <15 ug/mL (ref 10–30)

## 2014-04-02 LAB — PROTIME-INR
INR: 1.07 (ref 0.00–1.49)
Prothrombin Time: 13.9 seconds (ref 11.6–15.2)

## 2014-04-02 LAB — LIPASE, BLOOD
Lipase: >3000 U/L — ABNORMAL HIGH (ref 11–59)
Lipase: 2066 U/L — ABNORMAL HIGH (ref 11–59)

## 2014-04-02 LAB — TROPONIN I: Troponin I: <0.3 ng/mL (ref <0.30)

## 2014-04-02 MED ORDER — CLONAZEPAM 0.5 MG PO TABS
0.2500 mg | ORAL_TABLET | Freq: Two times a day (BID) | ORAL | Status: DC | PRN
  Administered 2014-04-09: 0.25 mg via ORAL
  Filled 2014-04-02 (×2): qty 1

## 2014-04-02 MED ORDER — IOHEXOL 300 MG/ML  SOLN
25.0000 mL | Freq: Once | INTRAMUSCULAR | Status: AC | PRN
  Administered 2014-04-02: 25 mL via ORAL

## 2014-04-02 MED ORDER — ONDANSETRON HCL 4 MG/2ML IJ SOLN
4.0000 mg | Freq: Four times a day (QID) | INTRAMUSCULAR | Status: DC | PRN
  Administered 2014-04-02 – 2014-04-04 (×4): 4 mg via INTRAVENOUS
  Filled 2014-04-02 (×4): qty 2

## 2014-04-02 MED ORDER — SODIUM CHLORIDE 0.9 % IV SOLN
INTRAVENOUS | Status: AC
  Administered 2014-04-02: 19:00:00 via INTRAVENOUS

## 2014-04-02 MED ORDER — SODIUM CHLORIDE 0.9 % IV SOLN
INTRAVENOUS | Status: AC
  Administered 2014-04-02: 04:00:00 via INTRAVENOUS

## 2014-04-02 MED ORDER — FENTANYL CITRATE 0.05 MG/ML IJ SOLN
100.0000 ug | Freq: Once | INTRAMUSCULAR | Status: AC
  Administered 2014-04-02: 100 ug via INTRAVENOUS
  Filled 2014-04-02: qty 2

## 2014-04-02 MED ORDER — FENTANYL CITRATE 0.05 MG/ML IJ SOLN
100.0000 ug | INTRAMUSCULAR | Status: DC | PRN
  Filled 2014-04-02: qty 2

## 2014-04-02 MED ORDER — SODIUM CHLORIDE 0.9 % IV SOLN
INTRAVENOUS | Status: AC
  Administered 2014-04-02 – 2014-04-03 (×2): via INTRAVENOUS

## 2014-04-02 MED ORDER — LEVOTHYROXINE SODIUM 137 MCG PO TABS
137.0000 ug | ORAL_TABLET | Freq: Every day | ORAL | Status: DC
  Administered 2014-04-02 – 2014-04-10 (×9): 137 ug via ORAL
  Filled 2014-04-02 (×11): qty 1

## 2014-04-02 MED ORDER — ACETAMINOPHEN 650 MG RE SUPP: 650.0000 mg | Freq: Four times a day (QID) | RECTAL | Status: DC | PRN

## 2014-04-02 MED ORDER — ACETAMINOPHEN 325 MG PO TABS: 650.0000 mg | ORAL_TABLET | Freq: Four times a day (QID) | ORAL | Status: DC | PRN

## 2014-04-02 MED ORDER — LORAZEPAM 2 MG/ML IJ SOLN
1.0000 mg | Freq: Four times a day (QID) | INTRAMUSCULAR | Status: DC | PRN
Start: 2014-04-02 — End: 2014-04-10
  Administered 2014-04-02 – 2014-04-09 (×3): 1 mg via INTRAVENOUS
  Filled 2014-04-02 (×3): qty 1

## 2014-04-02 MED ORDER — SIMVASTATIN 20 MG PO TABS
20.0000 mg | ORAL_TABLET | Freq: Every day | ORAL | Status: DC
  Administered 2014-04-02: 20 mg via ORAL
  Filled 2014-04-02 (×2): qty 1

## 2014-04-02 MED ORDER — PIPERACILLIN-TAZOBACTAM 3.375 G IVPB
3.3750 g | Freq: Three times a day (TID) | INTRAVENOUS | Status: DC
  Administered 2014-04-02 – 2014-04-04 (×7): 3.375 g via INTRAVENOUS
  Filled 2014-04-02 (×7): qty 50

## 2014-04-02 MED ORDER — IOHEXOL 300 MG/ML  SOLN
100.0000 mL | Freq: Once | INTRAMUSCULAR | Status: AC | PRN
  Administered 2014-04-02: 100 mL via INTRAVENOUS

## 2014-04-02 MED ORDER — INSULIN ASPART 100 UNIT/ML ~~LOC~~ SOLN
0.0000 [IU] | Freq: Three times a day (TID) | SUBCUTANEOUS | Status: DC
  Administered 2014-04-02: 5 [IU] via SUBCUTANEOUS
  Administered 2014-04-02: 3 [IU] via SUBCUTANEOUS
  Administered 2014-04-02: 2 [IU] via SUBCUTANEOUS
  Administered 2014-04-03: 3 [IU] via SUBCUTANEOUS
  Administered 2014-04-03 – 2014-04-05 (×4): 2 [IU] via SUBCUTANEOUS
  Administered 2014-04-06: 3 [IU] via SUBCUTANEOUS
  Administered 2014-04-07: 2 [IU] via SUBCUTANEOUS
  Administered 2014-04-07: 3 [IU] via SUBCUTANEOUS

## 2014-04-02 MED ORDER — ONDANSETRON HCL 4 MG/2ML IJ SOLN
INTRAMUSCULAR | Status: AC
  Administered 2014-04-02: 4 mg via INTRAVENOUS
  Filled 2014-04-02: qty 2

## 2014-04-02 MED ORDER — ONDANSETRON HCL 4 MG/2ML IJ SOLN: 4.0000 mg | Freq: Three times a day (TID) | INTRAMUSCULAR | Status: DC | PRN

## 2014-04-02 MED ORDER — INSULIN GLARGINE 100 UNIT/ML ~~LOC~~ SOLN
25.0000 [IU] | Freq: Every day | SUBCUTANEOUS | Status: DC
  Administered 2014-04-02 – 2014-04-09 (×8): 25 [IU] via SUBCUTANEOUS
  Filled 2014-04-02 (×9): qty 0.25

## 2014-04-02 MED ORDER — ONDANSETRON HCL 4 MG PO TABS: 4.0000 mg | ORAL_TABLET | Freq: Four times a day (QID) | ORAL | Status: DC | PRN

## 2014-04-02 MED ORDER — FENTANYL CITRATE 0.05 MG/ML IJ SOLN
100.0000 ug | INTRAMUSCULAR | Status: DC | PRN
  Administered 2014-04-02 – 2014-04-09 (×31): 100 ug via INTRAVENOUS
  Filled 2014-04-02 (×30): qty 2

## 2014-04-02 MED ORDER — PNEUMOCOCCAL VAC POLYVALENT 25 MCG/0.5ML IJ INJ
0.5000 mL | INJECTION | INTRAMUSCULAR | Status: AC
  Administered 2014-04-03: 0.5 mL via INTRAMUSCULAR
  Filled 2014-04-02 (×2): qty 0.5

## 2014-04-02 MED ORDER — DULOXETINE HCL 60 MG PO CPEP
90.0000 mg | ORAL_CAPSULE | Freq: Every day | ORAL | Status: DC
  Administered 2014-04-02 – 2014-04-10 (×9): 90 mg via ORAL
  Filled 2014-04-02 (×10): qty 1

## 2014-04-02 MED ORDER — HYDRALAZINE HCL 20 MG/ML IJ SOLN
10.0000 mg | INTRAMUSCULAR | Status: DC | PRN
  Administered 2014-04-08: 10 mg via INTRAVENOUS
  Filled 2014-04-02: qty 1

## 2014-04-02 MED ORDER — ENOXAPARIN SODIUM 40 MG/0.4ML ~~LOC~~ SOLN
40.0000 mg | SUBCUTANEOUS | Status: DC
  Administered 2014-04-02 – 2014-04-09 (×7): 40 mg via SUBCUTANEOUS
  Filled 2014-04-02 (×9): qty 0.4

## 2014-04-02 NOTE — Progress Notes (Signed)
ANTIBIOTIC CONSULT NOTE - INITIAL  Pharmacy Consult for zosyn Indication: Intra-abdominal Infection   Allergies  Allergen Reactions  . Erythromycin Anaphylaxis  . Morphine And Related Itching    Patient states that she had this in February with no complications  . Vancomycin Other (See Comments)    Red Man Syndrome    Patient Measurements: Height: 5\' 3"  (160 cm) Weight: 169 lb 1.5 oz (76.7 kg) IBW/kg (Calculated) : 52.4 Adjusted Body Weight:   Vital Signs: Temp: 98.5 F (36.9 C) (09/21 0349) Temp src: Oral (09/21 0349) BP: 145/81 mmHg (09/21 0349) Pulse Rate: 75 (09/21 0349) Intake/Output from previous day:   Intake/Output from this shift:    Labs:  Recent Labs  04/01/14 2352 04/02/14 0537  WBC 16.6* 11.0*  HGB 13.5 12.3  PLT 350 308  CREATININE 0.70  --    Estimated Creatinine Clearance: 80.6 ml/min (by C-G formula based on Cr of 0.7). No results found for this basename: VANCOTROUGH, VANCOPEAK, VANCORANDOM, GENTTROUGH, GENTPEAK, GENTRANDOM, TOBRATROUGH, TOBRAPEAK, TOBRARND, AMIKACINPEAK, AMIKACINTROU, AMIKACIN,  in the last 72 hours   Microbiology: No results found for this or any previous visit (from the past 720 hour(s)).  Medical History: Past Medical History  Diagnosis Date  . HTN (hypertension)   . Macular degeneration, bilateral   . Hypercholesteremia   . Hypothyroidism   . Diabetes mellitus type I   . Migraines 1980's    "when I was in my 20's" (09/23/2012)  . Stroke ? date & 09/22/2012  . Peripheral neuropathy   . Anxiety   . Diabetic osteomyelitis     right great toe/progress notes 09/23/2012  . Depression   . PTSD (post-traumatic stress disorder)     Medications:  Anti-infectives   Start     Dose/Rate Route Frequency Ordered Stop   04/02/14 0600  piperacillin-tazobactam (ZOSYN) IVPB 3.375 g     3.375 g 12.5 mL/hr over 240 Minutes Intravenous 3 times per day 04/02/14 0557       Assessment: Patient with Intra-abdominal Infection.     Goal of Therapy:  Zosyn based on renal function   Plan:  Follow up culture results Zosyn 3.375g IV Q8H infused over 4hrs.   Tyler Deis, Shea Stakes [place] 04/02/2014,5:58 AM

## 2014-04-02 NOTE — Care Management Note (Signed)
    Page 1 of 1   04/02/2014     1:45:33 PM CARE MANAGEMENT NOTE 04/02/2014  Patient:  Suzanne Allen, Suzanne Allen   Account Number:  [id number]  Date Initiated:  04/02/2014  Documentation initiated by:  Sunday Spillers  Subjective/Objective Assessment:   53 yo female admitted with pancreatitis. PTA lived at home with father.     Action/Plan:   Home when stable   Anticipated DC Date:  04/05/2014   Anticipated DC Plan:  [place]  CM consult      Choice offered to / List presented to:             Status of service:  Completed, signed off Medicare Important Message given?   (If response is "NO", the following Medicare IM given date fields will be blank) Date Medicare IM given:   Medicare IM given by:   Date Additional Medicare IM given:   Additional Medicare IM given by:    Discharge Disposition:  HOME/SELF CARE  Per UR Regulation:  Reviewed for med. necessity/level of care/duration of stay  If discussed at [place] of Stay Meetings, dates discussed:    Comments:

## 2014-04-02 NOTE — H&P (Addendum)
[facility] History and Physical  Yuriko Orzech [id number] DOB: 1960/10/23 DOA: 04/01/2014  Referring physician: ER physician. PCP: Default, Provider, MD   Chief Complaint: Abdominal pain.  HPI: Suzanne Allen is a 53 y.o. female with history of diabetes mellitus type 1, hypertension, hyperlipidemia, anxiety and depression presents to the ER with complaints of abdominal pain. Patient has been experiencing epigastric pain since yesterday morning with multiple episodes of nausea and vomiting. Pain is mostly epigastric area stabbing in nature severe radiating to the back. Denies any associate shortness of breath chest pain fever chills. Patient's labs show elevated lipase and LFTs. Patient has had pancreatitis this year in March and at that time cause was not clear.   Review of Systems: As presented in the history of presenting illness, rest negative.  Past Medical History  Diagnosis Date  . HTN (hypertension)   . Macular degeneration, bilateral   . Hypercholesteremia   . Hypothyroidism   . Diabetes mellitus type I   . Migraines 1980's    "when I was in my 20's" (09/23/2012)  . Stroke ? date & 09/22/2012  . Peripheral neuropathy   . Anxiety   . Diabetic osteomyelitis     right great toe/progress notes 09/23/2012  . Depression   . PTSD (post-traumatic stress disorder)    Past Surgical History  Procedure Laterality Date  . Cesarean section  1989; 1995  . Tonsillectomy  1965  . Refractive surgery Bilateral 2013  . Total thyroidectomy  2012  . Peripherally inserted central catheter insertion Right 08/31/2012  . I&d extremity Right     "big toe"; multiple  . Toe amputation     Social History:  reports that she has never smoked. She has never used smokeless tobacco. She reports that she does not drink alcohol or use illicit drugs. Where does patient live home. Can patient participate in ADLs? Yes.  Allergies  Allergen Reactions  . Erythromycin Anaphylaxis  . Morphine And  Related Itching    Patient states that she had this in February with no complications  . Vancomycin Other (See Comments)    Red Man Syndrome    Family History:  Family History  Problem Relation Age of Onset  . Depression Mother   . Alcohol abuse Father   . Alcohol abuse Sister       Prior to Admission medications   Medication Sig Start Date End Date Taking? Authorizing Provider  clonazePAM (KLONOPIN) 0.5 MG tablet Take 0.25 mg by mouth 2 (two) times daily as needed for anxiety.   Yes Historical Provider, MD  DULoxetine (CYMBALTA) 30 MG capsule Take 90 mg by mouth daily.   Yes Historical Provider, MD  insulin aspart (NOVOLOG) 100 UNIT/ML injection Inject 40 Units into the skin 3 (three) times daily before meals.   Yes Historical Provider, MD  insulin glargine (LANTUS) 100 UNIT/ML injection Inject 40 Units into the skin 2 (two) times daily.   Yes Historical Provider, MD  levothyroxine (SYNTHROID, LEVOTHROID) 137 MCG tablet Take 137 mcg by mouth daily.     Yes Historical Provider, MD  lisinopril-hydrochlorothiazide (PRINZIDE,ZESTORETIC) 20-25 MG per tablet Take 1 tablet by mouth daily.    Yes Historical Provider, MD  simvastatin (ZOCOR) 20 MG tablet Take 20 mg by mouth at bedtime.   Yes Historical Provider, MD    Physical Exam: Filed Vitals:   04/02/14 0230 04/02/14 0300 04/02/14 0334 04/02/14 0349  BP: 96/55 98/51  145/81  Pulse: 74 73  75  Temp:  98.5 F (36.9 C)  TempSrc:    Oral  Resp:    18  Height:   5\' 3"  (1.6 m)   Weight:   74.844 kg (165 lb) 76.7 kg (169 lb 1.5 oz)  SpO2: 95% 98%  98%     General:  Well-developed and nourished.  Eyes: Anicteric no pallor.  ENT: No discharge from the ears eyes nose mouth.  Neck: No mass felt.  Cardiovascular: S1-S2 heard.  Respiratory: No rhonchi or crepitations.  Abdomen: Soft mild tenderness epigastric area. No guarding rigidity.  Skin: No rash.  Musculoskeletal: No edema. Right foot has a brace. Has had previous  surgery on right foot.  Psychiatric: Appears normal.  Neurologic: Alert and oriented to time place and person. Moves all extremities.  Labs on Admission:  Basic Metabolic Panel:  Recent Labs Lab 04/01/14 2352  NA 137  K 4.5  CL 96  CO2 28  GLUCOSE 258*  BUN 14  CREATININE 0.70  CALCIUM 9.7   Liver Function Tests:  Recent Labs Lab 04/01/14 2352  AST 689*  ALT 623*  ALKPHOS 219*  BILITOT 1.4*  PROT 8.3  ALBUMIN 3.7    Recent Labs Lab 04/01/14 2352  LIPASE >3000*   No results found for this basename: AMMONIA,  in the last 168 hours CBC:  Recent Labs Lab 04/01/14 2352  WBC 16.6*  NEUTROABS 12.6*  HGB 13.5  HCT 39.3  MCV 90.6  PLT 350   Cardiac Enzymes: No results found for this basename: CKTOTAL, CKMB, CKMBINDEX, TROPONINI,  in the last 168 hours  BNP (last 3 results) No results found for this basename: PROBNP,  in the last 8760 hours CBG:  Recent Labs Lab 04/01/14 2354  GLUCAP 258*    Radiological Exams on Admission: No results found.   Assessment/Plan Principal Problem:   Acute pancreatitis Active Problems:   Diabetes type 1, uncontrolled   Benign essential HTN   Unspecified hypothyroidism   1. Acute pancreatitis with markedly elevated lipase and LFTs - at this time I have placed patient n.p.o. Since patient also has elevated LFTs we may have to rule out any CBD stones. Check CT abdomen and pelvis with contrast. Closely follow LFTs and lipase. Check lipid panel specifically for triglycerides. Continue with pain medications and hydration. We'll keep patient on empiric antibiotics for now. 2. Diabetes mellitus type 1 uncontrolled - sensation is n.p.o. I have decreased patient's home dose of Lantus. Closely follow CBGs with sliding-scale coverage. 3. Hypertension - hold lisinopril and HCTZ for now. Patient is placed on clear IV hydralazine. 4. Hypothyroidism - continue Synthroid. 5. Hyperlipidemia - on statins.    Code Status: Full  code.  Family Communication: None.  Disposition Plan: Admit to inpatient.    Marsden Zaino N. [facility] Pager [phone].  If 7PM-7AM, please contact night-coverage [email or web address] Password [facility] 04/02/2014, 4:32 AM

## 2014-04-02 NOTE — Progress Notes (Signed)
Patient seen and examined Currently complaining of epigastric pain, no nausea no vomiting  History of present illness 53 y.o. female with history of diabetes mellitus type 1, hypertension, hyperlipidemia, anxiety and depression presents to the ER with complaints of abdominal pain. Patient has been experiencing epigastric pain since yesterday morning with multiple episodes of nausea and vomiting. Pain is mostly epigastric area stabbing in nature severe radiating to the back. Denies any associate shortness of breath chest pain fever chills. Patient's labs show elevated lipase and LFTs. Patient has had pancreatitis this year in March and at that time cause was not clear  Assessment and plan 1. Acute pancreatitis with markedly elevated lipase and LFTs -CT of the abdomen shows early pancreatitis without pseudocyst formation. Ultrasound 3/25 did not show any gallstones. Will repeat ultrasound to rule out CBD stone. N.p.o. until lipase improved.  Closely follow LFTs and lipase. Check lipid panel specifically for triglycerides. Continue with pain medications and hydration. We'll keep patient on empiric antibiotics for now.   2. Diabetes mellitus type 1 uncontrolled - sensation is n.p.o. I have decreased patient's home dose of Lantus. Closely follow CBGs with sliding-scale coverage. 3. Hypertension - hold lisinopril and HCTZ for now. Patient is placed on clear IV hydralazine. 4. Hypothyroidism - continue Synthroid. 5. Hyperlipidemia - on statins. 6.

## 2014-04-02 NOTE — Consult Note (Addendum)
WOC wound consult note Reason for Consult: right foot wound, patient s/p foot surgery with grafting per Dr. Erling Conte at [facility].  I attempted to contact them however I was not able to speak to anyone and was forced to leave a VM. Pt reports she has bioengineered skin graft that is not to be disturbed until her return appt.  Wound type: surgical procedure right foot.  Dressing procedure/placement/frequency: Dressing to be left in place due to the presence of a bioengineered graft until follow up with her surgeon.  I verified the appt date is Oct. 5th, 2015  Discussed POC with patient and bedside nurse.  Re consult if needed, will not follow at this time. Thanks  Aristotle Lieb Kellogg, [place] [phone])

## 2014-04-02 NOTE — ED Notes (Signed)
PANIC PO2 "too low to read" Read back and confirmed by Merdis Delay, RT  Primary Nurse Sharrie Rothman) notified, will notify MD.

## 2014-04-03 ENCOUNTER — Inpatient Hospital Stay (HOSPITAL_COMMUNITY): Payer: Medicaid Other

## 2014-04-03 LAB — COMPREHENSIVE METABOLIC PANEL
ALT: 275 U/L — ABNORMAL HIGH (ref 0–35)
AST: 95 U/L — ABNORMAL HIGH (ref 0–37)
Albumin: 2.6 g/dL — ABNORMAL LOW (ref 3.5–5.2)
Alkaline Phosphatase: 145 U/L — ABNORMAL HIGH (ref 39–117)
Anion gap: 10 (ref 5–15)
BUN: 10 mg/dL (ref 6–23)
CO2: 26 mEq/L (ref 19–32)
Calcium: 7.8 mg/dL — ABNORMAL LOW (ref 8.4–10.5)
Chloride: 106 mEq/L (ref 96–112)
Creatinine, Ser: 0.7 mg/dL (ref 0.50–1.10)
GFR calc Af Amer: >90 mL/min (ref >90)
GFR calc non Af Amer: >90 mL/min (ref >90)
Glucose, Bld: 118 mg/dL — ABNORMAL HIGH (ref 70–99)
Potassium: 3.2 mEq/L — ABNORMAL LOW (ref 3.7–5.3)
Sodium: 142 mEq/L (ref 137–147)
Total Bilirubin: 0.6 mg/dL (ref 0.3–1.2)
Total Protein: 6.3 g/dL (ref 6.0–8.3)

## 2014-04-03 LAB — CBC
HCT: 32.7 % — ABNORMAL LOW (ref 36.0–46.0)
Hemoglobin: 11 g/dL — ABNORMAL LOW (ref 12.0–15.0)
MCH: 30.8 pg (ref 26.0–34.0)
MCHC: 33.6 g/dL (ref 30.0–36.0)
MCV: 91.6 fL (ref 78.0–100.0)
Platelets: 301 10*3/uL (ref 150–400)
RBC: 3.57 MIL/uL — ABNORMAL LOW (ref 3.87–5.11)
RDW: 12.8 % (ref 11.5–15.5)
WBC: 10.5 10*3/uL (ref 4.0–10.5)

## 2014-04-03 LAB — GLUCOSE, CAPILLARY
Glucose-Capillary: 122 mg/dL — ABNORMAL HIGH (ref 70–99)
Glucose-Capillary: 102 mg/dL — ABNORMAL HIGH (ref 70–99)
Glucose-Capillary: 139 mg/dL — ABNORMAL HIGH (ref 70–99)
Glucose-Capillary: 187 mg/dL — ABNORMAL HIGH (ref 70–99)
Glucose-Capillary: 142 mg/dL — ABNORMAL HIGH (ref 70–99)

## 2014-04-03 LAB — LIPASE, BLOOD: Lipase: 300 U/L — ABNORMAL HIGH (ref 11–59)

## 2014-04-03 MED ORDER — SODIUM CHLORIDE 0.9 % IV SOLN
INTRAVENOUS | Status: DC
  Administered 2014-04-03: 10:00:00 via INTRAVENOUS

## 2014-04-03 MED ORDER — FUROSEMIDE 10 MG/ML IJ SOLN
20.0000 mg | Freq: Once | INTRAMUSCULAR | Status: AC
  Administered 2014-04-03: 20 mg via INTRAVENOUS
  Filled 2014-04-03 (×2): qty 2

## 2014-04-03 MED ORDER — POTASSIUM CHLORIDE IN NACL 40-0.9 MEQ/L-% IV SOLN
INTRAVENOUS | Status: DC
  Administered 2014-04-03: 125 mL/h via INTRAVENOUS
  Filled 2014-04-03: qty 1000

## 2014-04-03 MED ORDER — LEVALBUTEROL HCL 1.25 MG/0.5ML IN NEBU
1.2500 mg | INHALATION_SOLUTION | Freq: Three times a day (TID) | RESPIRATORY_TRACT | Status: DC | PRN
  Administered 2014-04-03: 1.25 mg via RESPIRATORY_TRACT
  Filled 2014-04-03 (×2): qty 0.5

## 2014-04-03 MED ORDER — POTASSIUM CHLORIDE CRYS ER 20 MEQ PO TBCR
40.0000 meq | EXTENDED_RELEASE_TABLET | Freq: Three times a day (TID) | ORAL | Status: AC
  Administered 2014-04-03 – 2014-04-04 (×3): 40 meq via ORAL
  Filled 2014-04-03 (×4): qty 2

## 2014-04-03 MED ORDER — CETYLPYRIDINIUM CHLORIDE 0.05 % MT LIQD
7.0000 mL | Freq: Two times a day (BID) | OROMUCOSAL | Status: DC
  Administered 2014-04-03 – 2014-04-10 (×11): 7 mL via OROMUCOSAL

## 2014-04-03 NOTE — Progress Notes (Signed)
Called report to RN and Patient transferred to room # 1333 stable

## 2014-04-03 NOTE — Progress Notes (Signed)
[facility] PROGRESS NOTE  Suzanne Natalie [id number] DOB: 1961-05-09 DOA: 04/01/2014 PCP: Default, Provider, MD  Assessment/Plan: Principal Problem:   Acute pancreatitis Active Problems:   Diabetes type 1, uncontrolled   Benign essential HTN   Unspecified hypothyroidism    Assessment and plan  1. Acute pancreatitis with markedly elevated lipase and LFTs-unclear etiology-patient denies drinking alcohol -CT of the abdomen shows early pancreatitis without pseudocyst formation. Ultrasound 3/25 did not show any gallstones. Repeat ultrasound shows fatty infiltration of the liver, but no CBD stone. Advance diet as tolerated, lipase and liver function tests are improving. Closely follow LFTs and lipase. Lipid panel negative for hypertriglyceridemia. Continue with pain medications and hydration. We'll keep patient on empiric antibiotics for now. Leukocytosis improving. May discontinue antibiotics prior to discharge. Patient may benefit from outpatient gastroenterology consultation, possible ERCP to rule out stricture as this is her second episode of acute pancreatitis 2. Diabetes mellitus type 1 uncontrolled - advance diet as tolerated, I have decreased patient's home dose of Lantus. Adjust home dose of Lantus prior to discharge. Closely follow CBGs with sliding-scale coverage. 3. Hypertension - hold lisinopril and HCTZ for now. Patient is placed on clear IV hydralazine. Resume home meds prior to discharge 4. Hypothyroidism - continue Synthroid. 5. Hyperlipidemia - on statins.       6.   Hypokalemia replete    Code Status: full Family Communication: family updated about patient's clinical progress Disposition Plan:  Anticipate discharge tomorrow  Brief narrative: Suzanne Allen is a 53 y.o. female with history of diabetes mellitus type 1, hypertension, hyperlipidemia, anxiety and depression presents to the ER with complaints of abdominal pain. Patient has been experiencing epigastric pain  since yesterday morning with multiple episodes of nausea and vomiting. Pain is mostly epigastric area stabbing in nature severe radiating to the back. Denies any associate shortness of breath chest pain fever chills. Patient's labs show elevated lipase and LFTs. Patient has had pancreatitis this year in March and at that time cause was not clear.      Consultants:  None  Procedures:  None  Antibiotics:  Zosyn  HPI/Subjective: Patient drowsy from the pain medication but no nausea no abdominal pain  Objective: Filed Vitals:   04/02/14 1400 04/02/14 2200 04/03/14 0600 04/03/14 1133  BP: 117/70 128/69 127/66 111/57  Pulse: 72 78 80 67  Temp: 98.2 F (36.8 C) 99.3 F (37.4 C) 98.4 F (36.9 C) 98.8 F (37.1 C)  TempSrc: Oral Oral Oral Oral  Resp: 18 16 16 16   Height:    5\' 3"  (1.6 m)  Weight:   76.9 kg (169 lb 8.5 oz) 76.658 kg (169 lb)  SpO2: 94% 97% 90% 96%    Intake/Output Summary (Last 24 hours) at 04/03/14 1206 Last data filed at 04/03/14 1100  Gross per 24 hour  Intake 2793.33 ml  Output   1325 ml  Net 1468.33 ml    Exam:  General: alert & oriented x 3 In NAD  Cardiovascular: RRR, nl S1 s2  Respiratory: Decreased breath sounds at the bases, scattered rhonchi, no crackles  Abdomen: soft +BS NT/ND, no masses palpable  Extremities: No cyanosis and no edema      Data Reviewed: Basic Metabolic Panel:  Recent Labs Lab 04/01/14 2352 04/02/14 0537 04/03/14 0450  NA 137 138 142  K 4.5 3.9 3.2*  CL 96 101 106  CO2 28 26 26   GLUCOSE 258* 287* 118*  BUN 14 14 10   CREATININE 0.70 0.62 0.70  CALCIUM 9.7  8.1* 7.8*    Liver Function Tests:  Recent Labs Lab 04/01/14 2352 04/02/14 0537 04/03/14 0450  AST 689* 370* 95*  ALT 623* 495* 275*  ALKPHOS 219* 175* 145*  BILITOT 1.4* 0.8 0.6  PROT 8.3 6.6 6.3  ALBUMIN 3.7 2.8* 2.6*    Recent Labs Lab 04/01/14 2352 04/02/14 0537 04/03/14 0450  LIPASE >3000* 2066* 300*   No results found for this  basename: AMMONIA,  in the last 168 hours  CBC:  Recent Labs Lab 04/01/14 2352 04/02/14 0537 04/03/14 0450  WBC 16.6* 11.0* 10.5  NEUTROABS 12.6* 8.3*  --   HGB 13.5 12.3 11.0*  HCT 39.3 36.7 32.7*  MCV 90.6 90.8 91.6  PLT 350 308 301    Cardiac Enzymes:  Recent Labs Lab 04/02/14 0537  TROPONINI <0.30   BNP (last 3 results) No results found for this basename: PROBNP,  in the last 8760 hours   CBG:  Recent Labs Lab 04/02/14 1908 04/02/14 2358 04/03/14 0408 04/03/14 0749 04/03/14 1140  GLUCAP 126* 129* 122* 102* 139*    No results found for this or any previous visit (from the past 240 hour(s)).   Studies: Ct Abdomen Pelvis W Contrast  04/02/2014   CLINICAL DATA:  Epigastric pain, acute pancreatitis.  EXAM: CT ABDOMEN AND PELVIS WITH CONTRAST  TECHNIQUE: Multidetector CT imaging of the abdomen and pelvis was performed using the standard protocol following bolus administration of intravenous contrast.  CONTRAST:  70mL OMNIPAQUE IOHEXOL 300 MG/ML SOLN, 183mL OMNIPAQUE IOHEXOL 300 MG/ML SOLN  COMPARISON:  CT scan of February 20, 2011.  FINDINGS: Bilateral pars defects are seen at L5. Minimal bilateral pleural effusions are noted.  Mild fatty infiltration of the liver is noted. The spleen appears normal. Free fluid is seen posterior to the pancreatic tail, as well as mild inflammatory changes around the pancreatic head. These findings are concerning for early pancreatitis. No defined fluid collection or pseudocyst is seen currently. Adrenal glands and kidneys appear normal. No hydronephrosis or renal obstruction is noted. Stool is noted throughout the colon. There is no evidence of abscess. Urinary bladder and uterus appear normal. Ovaries appear normal. Stool is noted throughout the colon. Atherosclerotic calcifications of abdominal aorta are noted without aneurysm formation. Mildly enlarged lymph nodes are noted in the retroperitoneal region with the largest measuring 16 x 11  mm. This is unchanged compared to prior exam. Right inguinal lymph node measuring 20 x 9 mm is noted which is enlarged compared to prior exam. However, right external iliac adenopathy measuring 27 x 12 mm is noted which is increased compared to prior exam.  IMPRESSION: Mild fatty infiltration of the liver.  Findings consistent with early pancreatitis without pseudocyst formation seen at this time.  Stable retroperitoneal adenopathy is noted compared to prior exam of 2012. Right inguinal and external iliac adenopathy is noted which is slightly increased compared to prior exam of 2012 ; this most likely is reactive or inflammatory in origin, but followup CT scan in 12 months is recommended to ensure stability or resolution.   Electronically Signed   By: Sabino Dick M.D.   On: 04/02/2014 11:10   US Abdomen Limited  04/02/2014   CLINICAL DATA:  Abdominal pain.  EXAM: US ABDOMEN LIMITED - RIGHT UPPER QUADRANT  COMPARISON:  None.  FINDINGS: Gallbladder:  No gallstones or wall thickening visualized. No sonographic Murphy sign noted.  Common bile duct:  Diameter: Measures 4.5 mm which is within normal limits.  Liver:  No focal  lesion identified. Increased echogenicity is noted suggesting fatty infiltration.  IMPRESSION: Fatty infiltration of the liver. No other abnormality seen in the right upper quadrant of the abdomen.   Electronically Signed   By: Sabino Dick M.D.   On: 04/02/2014 13:55    Scheduled Meds: . antiseptic oral rinse  7 mL Mouth Rinse BID  . DULoxetine  90 mg Oral Daily  . enoxaparin (LOVENOX) injection  40 mg Subcutaneous Q24H  . insulin aspart  0-15 Units Subcutaneous TID WC  . insulin glargine  25 Units Subcutaneous QHS  . levothyroxine  137 mcg Oral QAC breakfast  . piperacillin-tazobactam (ZOSYN)  IV  3.375 Allen Intravenous 3 times per day   Continuous Infusions: . 0.9 % NaCl with KCl 40 mEq / L      Principal Problem:   Acute pancreatitis Active Problems:   Diabetes type 1,  uncontrolled   Benign essential HTN   Unspecified hypothyroidism    Time spent: 40 minutes   [facility] Pager [phone]. If 7PM-7AM, please contact night-coverage at [email or web address], password [email or web address] 04/03/2014, 12:06 PM  LOS: 2 days

## 2014-04-04 ENCOUNTER — Encounter (HOSPITAL_COMMUNITY): Payer: Self-pay | Admitting: Internal Medicine

## 2014-04-04 DIAGNOSIS — R7989 Other specified abnormal findings of blood chemistry: Secondary | ICD-10-CM | POA: Diagnosis present

## 2014-04-04 DIAGNOSIS — R945 Abnormal results of liver function studies: Secondary | ICD-10-CM

## 2014-04-04 DIAGNOSIS — E876 Hypokalemia: Secondary | ICD-10-CM | POA: Diagnosis not present

## 2014-04-04 DIAGNOSIS — R599 Enlarged lymph nodes, unspecified: Secondary | ICD-10-CM

## 2014-04-04 DIAGNOSIS — J810 Acute pulmonary edema: Secondary | ICD-10-CM | POA: Diagnosis present

## 2014-04-04 DIAGNOSIS — K760 Fatty (change of) liver, not elsewhere classified: Secondary | ICD-10-CM

## 2014-04-04 DIAGNOSIS — E785 Hyperlipidemia, unspecified: Secondary | ICD-10-CM | POA: Diagnosis present

## 2014-04-04 DIAGNOSIS — K7689 Other specified diseases of liver: Secondary | ICD-10-CM

## 2014-04-04 DIAGNOSIS — R591 Generalized enlarged lymph nodes: Secondary | ICD-10-CM | POA: Diagnosis present

## 2014-04-04 HISTORY — DX: Fatty (change of) liver, not elsewhere classified: K76.0

## 2014-04-04 HISTORY — DX: Enlarged lymph nodes, unspecified: R59.9

## 2014-04-04 LAB — COMPREHENSIVE METABOLIC PANEL
ALT: 232 U/L — ABNORMAL HIGH (ref 0–35)
AST: 66 U/L — ABNORMAL HIGH (ref 0–37)
Albumin: 3 g/dL — ABNORMAL LOW (ref 3.5–5.2)
Alkaline Phosphatase: 158 U/L — ABNORMAL HIGH (ref 39–117)
Anion gap: 11 (ref 5–15)
BUN: 9 mg/dL (ref 6–23)
CO2: 27 mEq/L (ref 19–32)
Calcium: 8.2 mg/dL — ABNORMAL LOW (ref 8.4–10.5)
Chloride: 103 mEq/L (ref 96–112)
Creatinine, Ser: 0.75 mg/dL (ref 0.50–1.10)
GFR calc Af Amer: >90 mL/min (ref >90)
GFR calc non Af Amer: >90 mL/min (ref >90)
Glucose, Bld: 123 mg/dL — ABNORMAL HIGH (ref 70–99)
Potassium: 3.6 mEq/L — ABNORMAL LOW (ref 3.7–5.3)
Sodium: 141 mEq/L (ref 137–147)
Total Bilirubin: 0.6 mg/dL (ref 0.3–1.2)
Total Protein: 7.4 g/dL (ref 6.0–8.3)

## 2014-04-04 LAB — GLUCOSE, CAPILLARY
Glucose-Capillary: 111 mg/dL — ABNORMAL HIGH (ref 70–99)
Glucose-Capillary: 111 mg/dL — ABNORMAL HIGH (ref 70–99)
Glucose-Capillary: 114 mg/dL — ABNORMAL HIGH (ref 70–99)
Glucose-Capillary: 117 mg/dL — ABNORMAL HIGH (ref 70–99)
Glucose-Capillary: 128 mg/dL — ABNORMAL HIGH (ref 70–99)
Glucose-Capillary: 147 mg/dL — ABNORMAL HIGH (ref 70–99)
Glucose-Capillary: 92 mg/dL (ref 70–99)

## 2014-04-04 LAB — LIPASE, BLOOD: Lipase: 199 U/L — ABNORMAL HIGH (ref 11–59)

## 2014-04-04 MED ORDER — POTASSIUM CHLORIDE CRYS ER 20 MEQ PO TBCR
20.0000 meq | EXTENDED_RELEASE_TABLET | Freq: Two times a day (BID) | ORAL | Status: AC
  Administered 2014-04-04: 20 meq via ORAL
  Filled 2014-04-04 (×3): qty 1

## 2014-04-04 MED ORDER — LORAZEPAM 2 MG/ML IJ SOLN
2.0000 mg | Freq: Once | INTRAMUSCULAR | Status: AC
  Administered 2014-04-05: 2 mg via INTRAVENOUS
  Filled 2014-04-04: qty 1

## 2014-04-04 NOTE — Progress Notes (Signed)
Placed order for dietician consult per patient request. Patient wants more education of what she can do to avoid "these attacks". Carenotes given on low fat diet and pancreatitis fact sheet.

## 2014-04-04 NOTE — Progress Notes (Signed)
MRI called and they are booked up and will not be able to do pt's MR Abd until tomorrow morning. NIght RN and pt made aware.

## 2014-04-04 NOTE — Plan of Care (Signed)
Problem: Phase II Progression Outcomes Goal: Tolerates PO clear liquids Outcome: Progressing On clear liquids, no vomiting. But mild nausea per patient.

## 2014-04-04 NOTE — Progress Notes (Signed)
Received a call from [facility] in lab stating pt was a very difficult stick and phlebotomy wasn't able to get enough blood for the requested IGG and IGGsub  blood tests. Requested to add the other test (IGG)  to AM labs (9/24)when different phlebotomist is able to attempt blood draw.

## 2014-04-04 NOTE — Progress Notes (Signed)
Progress Note   Suzanne Allen [id number] DOB: June 16, 1961 DOA: 04/01/2014 PCP: Default, Provider, MD   Brief Narrative:   Suzanne Allen is an 53 y.o. female with a PMH of type 1 diabetes, hypertension managed with lisinopril/HCTZ, hyperlipidemia, anxiety/depression and prior pancreatitis [id number] of uncertain etiology who was admitted on 04/01/14 with a chief complaint of abdominal pain. Upon initial evaluation in the ED, the patient was noted to have a marked elevation of her serum lipase.   Assessment/Plan:   Principal Problem:   Acute pancreatitis  Status post CT of the abdomen/pelvis 04/02/14 which showed early pancreatitis without pseudocyst formation.  Status post abdominal ultrasound 04/02/14 which was negative for gallstones. The common bile duct was normal in diameter.  Triglyceride levels are WNL.  The patient denies alcohol intake.  At this time, the only possible trigger is her lisinopril/HCTZ which has been associated with pancreatitis and is currently on hold, although she may have passed a stone given her rapid resolution of elevated LFTs and lipase.  Downgrade diet back to clear liquids given her current ongoing complaints of up to 8/10 abdominal pain.  Continue pain management and antinausea medications for now.  Rule out autoimmune hepatitis with IgG 4 level.  Rule out pancreatic divisum with MRCP.  Active Problems:   Elevated LFT / fatty liver  Marked elevation of LFTs, trending down over time, suspect the patient may have passed a stone although there is no CBD dilatation to suggest such.  Elevated LFTs may be from fatty infiltration of the liver. The patient was counseled on diet and exercise.  Acute hepatitis panel was negative. Tylenol levels not elevated.    Hypokalemia  Replete orally.    Adenopathy, right inguinal and external iliac  Thought to be reactive, but followup CT scan in 12 months recommended.    Acute pulmonary edema  IV fluids  at [facility]. Respiratory status improving. Likely from aggressive IV fluids.    History of diabetic osteomyelitis  Status post foot surgery with grafting per Dr. Erling Conte at [facility].  Dressings to be left in place due to the presence of a bioengineered graft until she follows up with her surgeon as an outpatient on 04/16/14.    Diabetes type 1, uncontrolled  Hemoglobin A1c was markedly elevated 10/04/13 at 14.5%. Repeat.  Currently being managed with moderate scale SS I 3 times a day and Lantus 25 units each bedtime.  CBGs 92-147.    Benign essential HTN  Lisinopril/HCTZ currently on hold. Would discontinue these medications in light of second episode of pancreatitis.    Unspecified hypothyroidism  Continue Synthroid.    Depression/anxiety   Continue Klonopin and Cymbalta.    Dyslipidemia   Continue Zocor.    DVT Prophylaxis  Continue Lovenox.  Code Status: Full. Family Communication: Father updated at the bedside. Disposition Plan: Home when stable.   IV Access:    Peripheral IV   Procedures and diagnostic studies:   Dg Chest 2 View 04/03/2014: Mild pulmonary edema and trace effusions.     Ct Abdomen Pelvis W Contrast 04/02/2014: Mild fatty infiltration of the liver.  Findings consistent with early pancreatitis without pseudocyst formation seen at this time.  Stable retroperitoneal adenopathy is noted compared to prior exam of 2012. Right inguinal and external iliac adenopathy is noted which is slightly increased compared to prior exam of 2012 ; this most likely is reactive or inflammatory in origin, but followup CT scan in 12 months is recommended to  ensure stability or resolution.    US Abdomen Limited 04/02/2014: Fatty infiltration of the liver. No other abnormality seen in the right upper quadrant of the abdomen.    Medical Consultants:    None.   Other Consultants:    Wound care nurse   Anti-Infectives:    Zosyn 04/02/14--->  04/04/14  Subjective:   Suzanne Allen is complaining of 6/10 abdominal pain, but has been as high as 8/10.  Has also had nausea, but no vomiting.  +diarrhea but no black or bloody stool.    Objective:    Filed Vitals:   04/03/14 2020 04/03/14 2034 04/03/14 2108 04/04/14 0511  BP:   120/68 134/58  Pulse: 76  72 70  Temp:   97.7 F (36.5 C) 98 F (36.7 C)  TempSrc:   Oral Oral  Resp:   16 16  Height:      Weight:    76.613 kg (168 lb 14.4 oz)  SpO2:  92% 100% 95%    Intake/Output Summary (Last 24 hours) at 04/04/14 1204 Last data filed at 04/04/14 0932  Gross per 24 hour  Intake   1058 ml  Output    655 ml  Net    403 ml    Exam: Gen:  NAD Cardiovascular:  RRR, No M/R/G Respiratory:  Lungs CTAB Gastrointestinal:  Abdomen soft, mildly tender, + BS Extremities:  Dressing right foot intact, No C/E/C   Data Reviewed:    Labs: Basic Metabolic Panel:  Recent Labs Lab 04/01/14 2352 04/02/14 0537 04/03/14 0450 04/04/14 0430  NA 137 138 142 141  K 4.5 3.9 3.2* 3.6*  CL 96 101 106 103  CO2 28 26 26 27   GLUCOSE 258* 287* 118* 123*  BUN 14 14 10 9   CREATININE 0.70 0.62 0.70 0.75  CALCIUM 9.7 8.1* 7.8* 8.2*   GFR Estimated Creatinine Clearance: 80.6 ml/min (by C-G formula based on Cr of 0.75). Liver Function Tests:  Recent Labs Lab 04/01/14 2352 04/02/14 0537 04/03/14 0450 04/04/14 0430  AST 689* 370* 95* 66*  ALT 623* 495* 275* 232*  ALKPHOS 219* 175* 145* 158*  BILITOT 1.4* 0.8 0.6 0.6  PROT 8.3 6.6 6.3 7.4  ALBUMIN 3.7 2.8* 2.6* 3.0*    Recent Labs Lab 04/01/14 2352 04/02/14 0537 04/03/14 0450 04/04/14 0430  LIPASE >3000* 2066* 300* 199*   Coagulation profile  Recent Labs Lab 04/02/14 0537  INR 1.07    CBC:  Recent Labs Lab 04/01/14 2352 04/02/14 0537 04/03/14 0450  WBC 16.6* 11.0* 10.5  NEUTROABS 12.6* 8.3*  --   HGB 13.5 12.3 11.0*  HCT 39.3 36.7 32.7*  MCV 90.6 90.8 91.6  PLT 350 308 301   Cardiac Enzymes:  Recent  Labs Lab 04/02/14 0537  TROPONINI <0.30   CBG:  Recent Labs Lab 04/03/14 2111 04/04/14 0033 04/04/14 0430 04/04/14 0753 04/04/14 1121  GLUCAP 142* 147* 117* 92 128*   Lipid Profile:  Recent Labs  04/02/14 0537  CHOL 168  HDL 43  LDLCALC 101*  TRIG 119  CHOLHDL 3.9   Microbiology No results found for this or any previous visit (from the past 240 hour(s)).   Medications:   . antiseptic oral rinse  7 mL Mouth Rinse BID  . DULoxetine  90 mg Oral Daily  . enoxaparin (LOVENOX) injection  40 mg Subcutaneous Q24H  . insulin aspart  0-15 Units Subcutaneous TID WC  . insulin glargine  25 Units Subcutaneous QHS  . levothyroxine  137 mcg Oral  QAC breakfast   Continuous Infusions:   Time spent: 35 minutes with > 50% of time discussing current diagnostic test results, clinical impression and plan of care with the patient and her father, discussing case with Dr. Collene Mares regarding further work up.    LOS: 3 days   [facility] Pager [phone]. If unable to reach me by pager, please call my cell phone at [phone].  *Please refer to [email or web address], password [email or web address] to get updated schedule on who will round on this patient, as hospitalists switch teams weekly. If 7PM-7AM, please contact night-coverage at [email or web address], password [email or web address] for any overnight needs.  04/04/2014, 12:04 PM

## 2014-04-04 NOTE — Progress Notes (Signed)
RD received consult for nutrition education regarding pancreatitis   Provided pt with "Pancreatitis Nutrition Therapy", "Fat-Restricted Nutrition Therapy" and "Fat Restricted Sample Menus" nutrition education handouts from the [employer]  Pt reported not having received any prior nutrition education regarding pancreatitis. Reported not complying to any diet restrictions. Denied any changes in appetite or weight prior to admit.  Reviewed importance of fat restriction and relationship to current disease state, with emphasis on its importance in assisting pt in recovery and tolerance of diet advancement. Encouraged pt to minimize intake of high fat dairy products, cream-based soups, and fried foods. Recommend pt consume non-fat or low fat substitutions, lean proteins, and fruits and vegetables as tolerated. Verbalized understanding.  Current diet order is Clear Liquids with 100% PO intake, denied nausea/abd pain post meals.  Provided RD contact information for additional nutrition related questions. No additional interventions warranted at this time, please re-consult as needed.  Atlee Abide MS RD LDN Clinical Dietitian [phone]

## 2014-04-05 ENCOUNTER — Inpatient Hospital Stay (HOSPITAL_COMMUNITY): Payer: Medicaid Other

## 2014-04-05 ENCOUNTER — Inpatient Hospital Stay (HOSPITAL_COMMUNITY): Admit: 2014-04-05 | Payer: Medicaid Other

## 2014-04-05 DIAGNOSIS — R197 Diarrhea, unspecified: Secondary | ICD-10-CM | POA: Diagnosis not present

## 2014-04-05 LAB — GLUCOSE, CAPILLARY
Glucose-Capillary: 149 mg/dL — ABNORMAL HIGH (ref 70–99)
Glucose-Capillary: 136 mg/dL — ABNORMAL HIGH (ref 70–99)
Glucose-Capillary: 101 mg/dL — ABNORMAL HIGH (ref 70–99)
Glucose-Capillary: 158 mg/dL — ABNORMAL HIGH (ref 70–99)

## 2014-04-05 LAB — COMPREHENSIVE METABOLIC PANEL
ALT: 172 U/L — ABNORMAL HIGH (ref 0–35)
AST: 40 U/L — ABNORMAL HIGH (ref 0–37)
Albumin: 3.2 g/dL — ABNORMAL LOW (ref 3.5–5.2)
Alkaline Phosphatase: 162 U/L — ABNORMAL HIGH (ref 39–117)
Anion gap: 12 (ref 5–15)
BUN: 7 mg/dL (ref 6–23)
CO2: 28 mEq/L (ref 19–32)
Calcium: 8.4 mg/dL (ref 8.4–10.5)
Chloride: 103 mEq/L (ref 96–112)
Creatinine, Ser: 0.72 mg/dL (ref 0.50–1.10)
GFR calc Af Amer: >90 mL/min (ref >90)
GFR calc non Af Amer: >90 mL/min (ref >90)
Glucose, Bld: 123 mg/dL — ABNORMAL HIGH (ref 70–99)
Potassium: 3.5 mEq/L — ABNORMAL LOW (ref 3.7–5.3)
Sodium: 143 mEq/L (ref 137–147)
Total Bilirubin: 0.4 mg/dL (ref 0.3–1.2)
Total Protein: 7.9 g/dL (ref 6.0–8.3)

## 2014-04-05 LAB — OCCULT BLOOD X 1 CARD TO LAB, STOOL: Fecal Occult Bld: NEGATIVE

## 2014-04-05 LAB — HEMOGLOBIN A1C
Hgb A1c MFr Bld: 10.3 % — ABNORMAL HIGH (ref <5.7)
Mean Plasma Glucose: 249 mg/dL — ABNORMAL HIGH (ref <117)

## 2014-04-05 LAB — LIPASE, BLOOD: Lipase: 70 U/L — ABNORMAL HIGH (ref 11–59)

## 2014-04-05 MED ORDER — POTASSIUM CHLORIDE CRYS ER 20 MEQ PO TBCR
20.0000 meq | EXTENDED_RELEASE_TABLET | Freq: Three times a day (TID) | ORAL | Status: AC
  Administered 2014-04-05 (×2): 20 meq via ORAL
  Filled 2014-04-05 (×3): qty 1

## 2014-04-05 NOTE — Progress Notes (Signed)
Progress Note   Suzanne Allen [id number] DOB: 08-08-1960 DOA: 04/01/2014 PCP: Default, Provider, MD   Brief Narrative:   Suzanne Allen is an 53 y.o. female with a PMH of type 1 diabetes, hypertension managed with lisinopril/HCTZ, hyperlipidemia, anxiety/depression and prior pancreatitis [id number] of uncertain etiology who was admitted on 04/01/14 with a chief complaint of abdominal pain. Upon initial evaluation in the ED, the patient was noted to have a marked elevation of her serum lipase.   Assessment/Plan:   Principal Problem:   Acute pancreatitis  Status post CT of the abdomen/pelvis 04/02/14 which showed early pancreatitis without pseudocyst formation.  Status post abdominal ultrasound 04/02/14 which was negative for gallstones. The common bile duct was normal in diameter.  Triglyceride levels are WNL.  The patient denies alcohol intake.  At this time, the only possible trigger is her lisinopril/HCTZ which has been associated with pancreatitis and is currently on hold, although she may have passed a stone given her rapid resolution of elevated LFTs and lipase.  Downgrade diet back to clear liquids given her current ongoing complaints of up to 8/10 abdominal pain.  Continue pain management and antinausea medications for now.  Ruling out autoimmune pancreatitis with IgG 4 level.  Attempted MRCP to rule out pancreatic divisum, but patient unable to tolerate.  Active Problems:   Diarrhea  Send stool for hemoccult/C.difficile PCR.    Elevated LFT / fatty liver  Marked elevation of LFTs, trending down over time, suspect the patient may have passed a stone although there is no CBD dilatation to suggest such.  Elevated LFTs may be from fatty infiltration of the liver. The patient was counseled on diet and exercise.  Acute hepatitis panel was negative. Tylenol levels not elevated.    Hypokalemia  Replete orally.    Adenopathy, right inguinal and external iliac  Thought  to be reactive, but followup CT scan in 12 months recommended.  Check GC/chlamydia probes and HIV serologies.    Acute pulmonary edema  IV fluids at [facility]. Respiratory status improving. Likely from aggressive IV fluids.    History of diabetic osteomyelitis  Status post foot surgery with grafting per Dr. Erling Conte at [facility].  Dressings to be left in place due to the presence of a bioengineered graft until she follows up with her surgeon as an outpatient on 04/16/14.    Diabetes type 1, uncontrolled  Hemoglobin A1c was markedly elevated 10/04/13 at 14.5%. Currently 10.3%.  Currently being managed with moderate scale SS I 3 times a day and Lantus 25 units each bedtime.  CBGs 92-114.    Benign essential HTN  Lisinopril/HCTZ currently on hold. Would discontinue these medications in light of second episode of pancreatitis.    Unspecified hypothyroidism  Continue Synthroid.    Depression/anxiety   Continue Klonopin and Cymbalta.    Dyslipidemia   Continue Zocor.    DVT Prophylaxis  Continue Lovenox.  Code Status: Full. Family Communication: Father updated at the bedside 04/04/14. Disposition Plan: Home when stable.   IV Access:    Peripheral IV   Procedures and diagnostic studies:   Dg Chest 2 View 04/03/2014: Mild pulmonary edema and trace effusions.     Ct Abdomen Pelvis W Contrast 04/02/2014: Mild fatty infiltration of the liver.  Findings consistent with early pancreatitis without pseudocyst formation seen at this time.  Stable retroperitoneal adenopathy is noted compared to prior exam of 2012. Right inguinal and external iliac adenopathy is noted which is slightly increased compared  to prior exam of 2012 ; this most likely is reactive or inflammatory in origin, but followup CT scan in 12 months is recommended to ensure stability or resolution.    US Abdomen Limited 04/02/2014: Fatty infiltration of the liver. No other abnormality seen in the right upper  quadrant of the abdomen.    Medical Consultants:    None.   Other Consultants:    Wound care nurse   Anti-Infectives:    Zosyn 04/02/14---> 04/04/14  Subjective:   Suzanne Allen is having liquid dark rose colored stools---4 since yesterday.  Rectal area burns with defecation.  She is complaining of 6-8/10 abdominal pain in mid abdomen radiating to back.  Has also had nausea, but no vomiting.     Objective:    Filed Vitals:   04/04/14 0511 04/04/14 1339 04/04/14 2103 04/05/14 0451  BP: 134/58 132/64 144/74 145/74  Pulse: 70 67 69 65  Temp: 98 F (36.7 C) 98.2 F (36.8 C) 97.8 F (36.6 C) 97.6 F (36.4 C)  TempSrc: Oral Oral Oral Oral  Resp: 16 17 16 16   Height:      Weight: 76.613 kg (168 lb 14.4 oz)   76.703 kg (169 lb 1.6 oz)  SpO2: 95% 96% 95% 96%    Intake/Output Summary (Last 24 hours) at 04/05/14 0804 Last data filed at 04/05/14 0452  Gross per 24 hour  Intake    900 ml  Output    475 ml  Net    425 ml    Exam: Gen:  NAD Cardiovascular:  RRR, No M/R/G Respiratory:  Lungs CTAB Gastrointestinal:  Abdomen soft, mildly tender, + BS Extremities:  Dressing right foot intact, No C/E/C   Data Reviewed:    Labs: Basic Metabolic Panel:  Recent Labs Lab 04/01/14 2352 04/02/14 0537 04/03/14 0450 04/04/14 0430 04/05/14 0434  NA 137 138 142 141 143  K 4.5 3.9 3.2* 3.6* 3.5*  CL 96 101 106 103 103  CO2 28 26 26 27 28   GLUCOSE 258* 287* 118* 123* 123*  BUN 14 14 10 9 7   CREATININE 0.70 0.62 0.70 0.75 0.72  CALCIUM 9.7 8.1* 7.8* 8.2* 8.4   GFR Estimated Creatinine Clearance: 80.6 ml/min (by C-G formula based on Cr of 0.72). Liver Function Tests:  Recent Labs Lab 04/01/14 2352 04/02/14 0537 04/03/14 0450 04/04/14 0430 04/05/14 0434  AST 689* 370* 95* 66* 40*  ALT 623* 495* 275* 232* 172*  ALKPHOS 219* 175* 145* 158* 162*  BILITOT 1.4* 0.8 0.6 0.6 0.4  PROT 8.3 6.6 6.3 7.4 7.9  ALBUMIN 3.7 2.8* 2.6* 3.0* 3.2*    Recent Labs Lab  04/01/14 2352 04/02/14 0537 04/03/14 0450 04/04/14 0430 04/05/14 0434  LIPASE >3000* 2066* 300* 199* 70*   Coagulation profile  Recent Labs Lab 04/02/14 0537  INR 1.07    CBC:  Recent Labs Lab 04/01/14 2352 04/02/14 0537 04/03/14 0450  WBC 16.6* 11.0* 10.5  NEUTROABS 12.6* 8.3*  --   HGB 13.5 12.3 11.0*  HCT 39.3 36.7 32.7*  MCV 90.6 90.8 91.6  PLT 350 308 301   Cardiac Enzymes:  Recent Labs Lab 04/02/14 0537  TROPONINI <0.30   CBG:  Recent Labs Lab 04/04/14 0753 04/04/14 1121 04/04/14 1753 04/04/14 2106 04/04/14 2342  GLUCAP 92 128* 111* 114* 111*   Lipid Profile: No results found for this basename: CHOL, HDL, LDLCALC, TRIG, CHOLHDL, LDLDIRECT,  in the last 72 hours Microbiology No results found for this or any previous visit (from the  past 240 hour(s)).   Medications:   . antiseptic oral rinse  7 mL Mouth Rinse BID  . DULoxetine  90 mg Oral Daily  . enoxaparin (LOVENOX) injection  40 mg Subcutaneous Q24H  . insulin aspart  0-15 Units Subcutaneous TID WC  . insulin glargine  25 Units Subcutaneous QHS  . levothyroxine  137 mcg Oral QAC breakfast  . potassium chloride  20 mEq Oral BID   Continuous Infusions:   Time spent: 25 minutes.    LOS: 4 days   [facility] Pager [phone]. If unable to reach me by pager, please call my cell phone at [phone].  *Please refer to [email or web address], password [email or web address] to get updated schedule on who will round on this patient, as hospitalists switch teams weekly. If 7PM-7AM, please contact night-coverage at [email or web address], password [email or web address] for any overnight needs.  04/05/2014, 8:04 AM

## 2014-04-06 LAB — GLUCOSE, CAPILLARY
Glucose-Capillary: 79 mg/dL (ref 70–99)
Glucose-Capillary: 82 mg/dL (ref 70–99)
Glucose-Capillary: 103 mg/dL — ABNORMAL HIGH (ref 70–99)
Glucose-Capillary: 87 mg/dL (ref 70–99)
Glucose-Capillary: 151 mg/dL — ABNORMAL HIGH (ref 70–99)
Glucose-Capillary: 183 mg/dL — ABNORMAL HIGH (ref 70–99)

## 2014-04-06 LAB — COMPREHENSIVE METABOLIC PANEL
ALT: 104 U/L — ABNORMAL HIGH (ref 0–35)
AST: 28 U/L (ref 0–37)
Albumin: 3 g/dL — ABNORMAL LOW (ref 3.5–5.2)
Alkaline Phosphatase: 133 U/L — ABNORMAL HIGH (ref 39–117)
Anion gap: 12 (ref 5–15)
BUN: 6 mg/dL (ref 6–23)
CO2: 27 mEq/L (ref 19–32)
Calcium: 8.6 mg/dL (ref 8.4–10.5)
Chloride: 102 mEq/L (ref 96–112)
Creatinine, Ser: 0.7 mg/dL (ref 0.50–1.10)
GFR calc Af Amer: >90 mL/min (ref >90)
GFR calc non Af Amer: >90 mL/min (ref >90)
Glucose, Bld: 106 mg/dL — ABNORMAL HIGH (ref 70–99)
Potassium: 4.4 mEq/L (ref 3.7–5.3)
Sodium: 141 mEq/L (ref 137–147)
Total Bilirubin: 0.3 mg/dL (ref 0.3–1.2)
Total Protein: 7.2 g/dL (ref 6.0–8.3)

## 2014-04-06 LAB — CBC
HCT: 35.9 % — ABNORMAL LOW (ref 36.0–46.0)
Hemoglobin: 11.9 g/dL — ABNORMAL LOW (ref 12.0–15.0)
MCH: 30.4 pg (ref 26.0–34.0)
MCHC: 33.1 g/dL (ref 30.0–36.0)
MCV: 91.8 fL (ref 78.0–100.0)
Platelets: 407 10*3/uL — ABNORMAL HIGH (ref 150–400)
RBC: 3.91 MIL/uL (ref 3.87–5.11)
RDW: 12.9 % (ref 11.5–15.5)
WBC: 8.2 10*3/uL (ref 4.0–10.5)

## 2014-04-06 LAB — HIV ANTIBODY (ROUTINE TESTING W REFLEX): HIV 1&2 Ab, 4th Generation: NONREACTIVE

## 2014-04-06 LAB — IGG: IgG (Immunoglobin G), Serum: 1130 mg/dL (ref 690–1700)

## 2014-04-06 LAB — CLOSTRIDIUM DIFFICILE BY PCR: Toxigenic C. Difficile by PCR: NEGATIVE

## 2014-04-06 MED ORDER — OXYCODONE HCL 5 MG PO TABS
5.0000 mg | ORAL_TABLET | Freq: Four times a day (QID) | ORAL | Status: DC | PRN
  Administered 2014-04-07: 5 mg via ORAL
  Filled 2014-04-06: qty 1

## 2014-04-06 NOTE — Progress Notes (Signed)
Progress Note   Suzanne Allen [id number] DOB: 1961-06-25 DOA: 04/01/2014 PCP: Default, Provider, MD   Brief Narrative:   Suzanne Allen is an 53 y.o. female with a PMH of type 1 diabetes, hypertension managed with lisinopril/HCTZ, hyperlipidemia, anxiety/depression and prior pancreatitis [id number] of uncertain etiology who was admitted on 04/01/14 with a chief complaint of abdominal pain. Upon initial evaluation in the ED, the patient was noted to have a marked elevation of her serum lipase.   Assessment/Plan:   Principal Problem:   Acute pancreatitis  Status post CT of the abdomen/pelvis 04/02/14 which showed early pancreatitis without pseudocyst formation.  Status post abdominal ultrasound 04/02/14 which was negative for gallstones. The common bile duct was normal in diameter.  Triglyceride levels are WNL.  The patient denies alcohol intake.  At this time, the only possible trigger is her lisinopril/HCTZ which has been associated with pancreatitis and is currently on hold, although she may have passed a stone given her rapid resolution of elevated LFTs and lipase.  Relates Abdominal pain better, only bad on her back. Will try full liquid diet.   Continue pain management and antinausea medications for now.  Ruling out autoimmune pancreatitis with IgG 4 level.  Attempted MRCP to rule out pancreatic divisum, but patient unable to tolerate.  Will consult GI. LFT pending for this morning.   Will consult GI.      Diarrhea, Red stool.   Send stool for hemoccult negative time one. /C.difficile PCR negative.   HB pending for this morning.     Elevated LFT / fatty liver  Marked elevation of LFTs, trending down over time, suspect the patient may have passed a stone although there is no CBD dilatation to suggest such.  Elevated LFTs may be from fatty infiltration of the liver. The patient was counseled on diet and exercise.  Acute hepatitis panel was negative. Tylenol levels not  elevated.    Hypokalemia  Replete orally.    Adenopathy, right inguinal and external iliac  Thought to be reactive, but followup CT scan in 12 months recommended.  Check GC/chlamydia probes and HIV serologies.    Acute pulmonary edema  IV fluids at [facility]. Respiratory status improving. Likely from aggressive IV fluids.    History of diabetic osteomyelitis  Status post foot surgery with grafting per Dr. Erling Conte at [facility].  Dressings to be left in place due to the presence of a bioengineered graft until she follows up with her surgeon as an outpatient on 04/16/14.    Diabetes type 1, uncontrolled  Hemoglobin A1c was markedly elevated 10/04/13 at 14.5%. Currently 10.3%.  Currently being managed with moderate scale SS I 3 times a day and Lantus 25 units each bedtime.  CBGs 92-114.    Benign essential HTN  Lisinopril/HCTZ currently on hold. Would discontinue these medications in light of second episode of pancreatitis.    Unspecified hypothyroidism  Continue Synthroid.    Depression/anxiety   Continue Klonopin and Cymbalta.    Dyslipidemia   Continue Zocor.    DVT Prophylaxis  Continue Lovenox.  Code Status: Full. Family Communication: Father updated at the bedside 04/06/14. Disposition Plan: Home when stable.   IV Access:    Peripheral IV   Procedures and diagnostic studies:   Dg Chest 2 View 04/03/2014: Mild pulmonary edema and trace effusions.     Ct Abdomen Pelvis W Contrast 04/02/2014: Mild fatty infiltration of the liver.  Findings consistent with early pancreatitis without pseudocyst formation seen  at this time.  Stable retroperitoneal adenopathy is noted compared to prior exam of 2012. Right inguinal and external iliac adenopathy is noted which is slightly increased compared to prior exam of 2012 ; this most likely is reactive or inflammatory in origin, but followup CT scan in 12 months is recommended to ensure stability or resolution.     US Abdomen Limited 04/02/2014: Fatty infiltration of the liver. No other abnormality seen in the right upper quadrant of the abdomen.    Medical Consultants:    None.   Other Consultants:    Wound care nurse   Anti-Infectives:    Zosyn 04/02/14---> 04/04/14  Subjective:   Suzanne Allen is having liquid dark rose colored stools- abdominal pain anterior abdomen better, pain is more in her back.      Objective:    Filed Vitals:   04/05/14 0451 04/05/14 1500 04/05/14 2042 04/06/14 0500  BP: 145/74 160/84 151/81 156/80  Pulse: 65 75 74 72  Temp: 97.6 F (36.4 C) 97.9 F (36.6 C) 98.3 F (36.8 C) 97.9 F (36.6 C)  TempSrc: Oral Oral Oral Oral  Resp: 16 18 16 16   Height:      Weight: 76.703 kg (169 lb 1.6 oz)   75.6 kg (166 lb 10.7 oz)  SpO2: 96% 97% 98% 95%    Intake/Output Summary (Last 24 hours) at 04/06/14 1035 Last data filed at 04/05/14 1900  Gross per 24 hour  Intake    840 ml  Output      0 ml  Net    840 ml    Exam: Gen:  NAD Cardiovascular:  RRR, No M/R/G Respiratory:  Lungs CTAB Gastrointestinal:  Abdomen soft, mildly tender, + BS Extremities:  Dressing right foot intact, No C/E/C   Data Reviewed:    Labs: Basic Metabolic Panel:  Recent Labs Lab 04/01/14 2352 04/02/14 0537 04/03/14 0450 04/04/14 0430 04/05/14 0434  NA 137 138 142 141 143  K 4.5 3.9 3.2* 3.6* 3.5*  CL 96 101 106 103 103  CO2 28 26 26 27 28   GLUCOSE 258* 287* 118* 123* 123*  BUN 14 14 10 9 7   CREATININE 0.70 0.62 0.70 0.75 0.72  CALCIUM 9.7 8.1* 7.8* 8.2* 8.4   GFR Estimated Creatinine Clearance: 80.1 ml/min (by C-G formula based on Cr of 0.72). Liver Function Tests:  Recent Labs Lab 04/01/14 2352 04/02/14 0537 04/03/14 0450 04/04/14 0430 04/05/14 0434  AST 689* 370* 95* 66* 40*  ALT 623* 495* 275* 232* 172*  ALKPHOS 219* 175* 145* 158* 162*  BILITOT 1.4* 0.8 0.6 0.6 0.4  PROT 8.3 6.6 6.3 7.4 7.9  ALBUMIN 3.7 2.8* 2.6* 3.0* 3.2*    Recent Labs Lab  04/01/14 2352 04/02/14 0537 04/03/14 0450 04/04/14 0430 04/05/14 0434  LIPASE >3000* 2066* 300* 199* 70*   Coagulation profile  Recent Labs Lab 04/02/14 0537  INR 1.07    CBC:  Recent Labs Lab 04/01/14 2352 04/02/14 0537 04/03/14 0450  WBC 16.6* 11.0* 10.5  NEUTROABS 12.6* 8.3*  --   HGB 13.5 12.3 11.0*  HCT 39.3 36.7 32.7*  MCV 90.6 90.8 91.6  PLT 350 308 301   Cardiac Enzymes:  Recent Labs Lab 04/02/14 0537  TROPONINI <0.30   CBG:  Recent Labs Lab 04/05/14 1632 04/05/14 2043 04/06/14 0114 04/06/14 0433 04/06/14 0742  GLUCAP 101* 158* 79 82 103*   Lipid Profile: No results found for this basename: CHOL, HDL, LDLCALC, TRIG, CHOLHDL, LDLDIRECT,  in the last 72 hours  Microbiology Recent Results (from the past 240 hour(s))  CLOSTRIDIUM DIFFICILE BY PCR     Status: None   Collection Time    04/05/14  5:21 PM      Result Value Ref Range Status   C difficile by pcr NEGATIVE  NEGATIVE Final   Comment: Performed at [facility]     Medications:   . antiseptic oral rinse  7 mL Mouth Rinse BID  . DULoxetine  90 mg Oral Daily  . enoxaparin (LOVENOX) injection  40 mg Subcutaneous Q24H  . insulin aspart  0-15 Units Subcutaneous TID WC  . insulin glargine  25 Units Subcutaneous QHS  . levothyroxine  137 mcg Oral QAC breakfast   Continuous Infusions:   Time spent: 25 minutes.    LOS: 5 days   Niel Hummer A  [facility] Pager [phone].   *Please refer to [email or web address], password [email or web address] to get updated schedule on who will round on this patient, as hospitalists switch teams weekly. If 7PM-7AM, please contact night-coverage at [email or web address], password [email or web address] for any overnight needs.  04/06/2014, 10:35 AM

## 2014-04-06 NOTE — Consult Note (Signed)
Unassigned Consult  Reason for Consult: Pancreatitis Referring Physician: [facility]  [name] HPI: This is a 53 year old female with a PMH of pancreatitis in 09/2013, HTN, DM, anxiety, depression, and hyperlipidemia who is admitted for complaints of epigastric pain, nausea, and vomiting.  She has pain that radiates to her back.  During this admission her AST/ALT were markedly elevated in the 500-600 range and her lipase was also elevated.  In march this year she reports issues with nausea and vomiting and her lipase was elevated in the 600 range, but she did not have an elevation in her liver enzymes.  The current CT scan is negative for any overt stones and there is evidence of pancreatitis.  No history of ETOH or illicit drug use.  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  Past Medical History  Diagnosis Date  . HTN (hypertension)   . Macular degeneration, bilateral   . Hypercholesteremia   . Hypothyroidism   . Diabetes mellitus type I   . Migraines 1980's    "when I was in my 20's" (09/23/2012)  . Stroke ? date & 09/22/2012  . Peripheral neuropathy   . Anxiety   . Diabetic osteomyelitis     right great toe/progress notes 09/23/2012  . Depression   . PTSD (post-traumatic stress disorder)   . 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 Fatty liver 04/04/2014    Past Surgical History  Procedure Laterality Date  . Cesarean section  1989; 1995  . Tonsillectomy  1965  . Refractive surgery Bilateral 2013  . Total thyroidectomy  2012  . Peripherally inserted central catheter insertion Right 08/31/2012  . I&d extremity Right     "big toe"; multiple  . Toe amputation      Family History  Problem Relation Age of Onset  . Depression Mother   . Alcohol abuse Father   . Alcohol abuse Sister     Social History:  reports that she has never smoked. She has never used smokeless tobacco. She reports that she does not drink  alcohol or use illicit drugs.  Allergies:  Allergies  Allergen Reactions  . Erythromycin Anaphylaxis  . Morphine And Related Itching    Patient states that she had this in February with no complications  . Vancomycin Other (See Comments)    Red Man Syndrome    Medications:  Scheduled: . antiseptic oral rinse  7 mL Mouth Rinse BID  . DULoxetine  90 mg Oral Daily  . enoxaparin (LOVENOX) injection  40 mg Subcutaneous Q24H  . insulin aspart  0-15 Units Subcutaneous TID WC  . insulin glargine  25 Units Subcutaneous QHS  . levothyroxine  137 mcg Oral QAC breakfast   Continuous:   Results for orders placed during the hospital encounter of 04/01/14 (from the past 24 hour(s))  HIV ANTIBODY (ROUTINE TESTING)     Status: None   Collection Time    04/05/14  1:30 PM      Result Value Ref Range   HIV 1&2 Ab, 4th Generation NONREACTIVE  NONREACTIVE  GLUCOSE, CAPILLARY     Status: Abnormal   Collection Time    04/05/14  4:32 PM      Result Value Ref Range   Glucose-Capillary 101 (*) 70 - 99 mg/dL   Comment 1 Notify RN    CLOSTRIDIUM DIFFICILE BY PCR     Status: None   Collection Time    04/05/14  5:21 PM  Result Value Ref Range   C difficile by pcr NEGATIVE  NEGATIVE  OCCULT BLOOD X 1 CARD TO LAB, STOOL     Status: None   Collection Time    04/05/14  5:34 PM      Result Value Ref Range   Fecal Occult Bld NEGATIVE  NEGATIVE  GLUCOSE, CAPILLARY     Status: Abnormal   Collection Time    04/05/14  8:43 PM      Result Value Ref Range   Glucose-Capillary 158 (*) 70 - 99 mg/dL   Comment 1 Notify RN    GLUCOSE, CAPILLARY     Status: None   Collection Time    04/06/14  1:14 AM      Result Value Ref Range   Glucose-Capillary 79  70 - 99 mg/dL   Comment 1 Notify RN    GLUCOSE, CAPILLARY     Status: None   Collection Time    04/06/14  4:33 AM      Result Value Ref Range   Glucose-Capillary 82  70 - 99 mg/dL   Comment 1 Notify RN    GLUCOSE, CAPILLARY     Status: Abnormal    Collection Time    04/06/14  7:42 AM      Result Value Ref Range   Glucose-Capillary 103 (*) 70 - 99 mg/dL   Comment 1 Documented in Chart     Comment 2 Notify RN    COMPREHENSIVE METABOLIC PANEL     Status: Abnormal   Collection Time    04/06/14 10:48 AM      Result Value Ref Range   Sodium 141  137 - 147 mEq/L   Potassium 4.4  3.7 - 5.3 mEq/L   Chloride 102  96 - 112 mEq/L   CO2 27  19 - 32 mEq/L   Glucose, Bld 106 (*) 70 - 99 mg/dL   BUN 6  6 - 23 mg/dL   Creatinine, Ser 0.70  0.50 - 1.10 mg/dL   Calcium 8.6  8.4 - 10.5 mg/dL   Total Protein 7.2  6.0 - 8.3 g/dL   Albumin 3.0 (*) 3.5 - 5.2 g/dL   AST 28  0 - 37 U/L   ALT 104 (*) 0 - 35 U/L   Alkaline Phosphatase 133 (*) 39 - 117 U/L   Total Bilirubin 0.3  0.3 - 1.2 mg/dL   GFR calc non Af Amer >90  >90 mL/min   GFR calc Af Amer >90  >90 mL/min   Anion gap 12  5 - 15  CBC     Status: Abnormal   Collection Time    04/06/14 10:48 AM      Result Value Ref Range   WBC 8.2  4.0 - 10.5 K/uL   RBC 3.91  3.87 - 5.11 MIL/uL   Hemoglobin 11.9 (*) 12.0 - 15.0 g/dL   HCT 35.9 (*) 36.0 - 46.0 %   MCV 91.8  78.0 - 100.0 fL   MCH 30.4  26.0 - 34.0 pg   MCHC 33.1  30.0 - 36.0 g/dL   RDW 12.9  11.5 - 15.5 %   Platelets 407 (*) 150 - 400 K/uL  GLUCOSE, CAPILLARY     Status: None   Collection Time    04/06/14 12:12 PM      Result Value Ref Range   Glucose-Capillary 87  70 - 99 mg/dL   Comment 1 Documented in Chart     Comment 2 Notify RN  Mr Attempted Daymon Larsen Report  04/05/2014   This examination belongs to an outside facility and is stored  here for comparison purposes only.  Contact the originating outside  institution for any associated report or interpretation.   ROS:  As stated above in the HPI otherwise negative.  Blood pressure 156/80, pulse 72, temperature 97.9 F (36.6 C), temperature source Oral, resp. rate 16, height 5\' 3"  (1.6 m), weight 75.6 kg (166 lb 10.7 oz), SpO2 95.00%.    PE: Gen: NAD, Alert and  Oriented HEENT:  [place]/AT, EOMI Neck: Supple, no LAD Lungs: CTA Bilaterally CV: RRR without M/G/R ABM: Soft, obese, soft, +BS Ext: Partial right foot amputation  Assessment/Plan: 1) Acute pancreatitis. 2) Elevated liver enzymes. 3) Nausea/Vomiting and epigastric pain.   The patient most likely has a gallstone pancreatitis.  Her elevation in ALT is very specific for this etiology.  Unfortunately she was not able to undergo an MRCP, but she can be scheduled for an EUS as an outpatient.  Plan: 1) Pain control. 2) Supportive care. 3) Advance diet as necessary. 4) Outpatient EUS.  Daivon Rayos D 04/06/2014, 1:26 PM

## 2014-04-06 NOTE — Plan of Care (Signed)
Problem: Phase III Progression Outcomes Goal: Tolerating diet Patient has no c/o nausea or vomiting. Had full liquid diet.

## 2014-04-07 LAB — COMPREHENSIVE METABOLIC PANEL
ALT: 70 U/L — ABNORMAL HIGH (ref 0–35)
AST: 19 U/L (ref 0–37)
Albumin: 3 g/dL — ABNORMAL LOW (ref 3.5–5.2)
Alkaline Phosphatase: 124 U/L — ABNORMAL HIGH (ref 39–117)
Anion gap: 14 (ref 5–15)
BUN: 5 mg/dL — ABNORMAL LOW (ref 6–23)
CO2: 26 mEq/L (ref 19–32)
Calcium: 9 mg/dL (ref 8.4–10.5)
Chloride: 98 mEq/L (ref 96–112)
Creatinine, Ser: 0.72 mg/dL (ref 0.50–1.10)
GFR calc Af Amer: >90 mL/min (ref >90)
GFR calc non Af Amer: >90 mL/min (ref >90)
Glucose, Bld: 243 mg/dL — ABNORMAL HIGH (ref 70–99)
Potassium: 3.9 mEq/L (ref 3.7–5.3)
Sodium: 138 mEq/L (ref 137–147)
Total Bilirubin: 0.3 mg/dL (ref 0.3–1.2)
Total Protein: 7.2 g/dL (ref 6.0–8.3)

## 2014-04-07 LAB — GLUCOSE, CAPILLARY
Glucose-Capillary: 119 mg/dL — ABNORMAL HIGH (ref 70–99)
Glucose-Capillary: 94 mg/dL (ref 70–99)
Glucose-Capillary: 106 mg/dL — ABNORMAL HIGH (ref 70–99)
Glucose-Capillary: 125 mg/dL — ABNORMAL HIGH (ref 70–99)
Glucose-Capillary: 164 mg/dL — ABNORMAL HIGH (ref 70–99)
Glucose-Capillary: 310 mg/dL — ABNORMAL HIGH (ref 70–99)
Glucose-Capillary: 219 mg/dL — ABNORMAL HIGH (ref 70–99)

## 2014-04-07 MED ORDER — INSULIN ASPART 100 UNIT/ML ~~LOC~~ SOLN
0.0000 [IU] | Freq: Three times a day (TID) | SUBCUTANEOUS | Status: DC
  Administered 2014-04-08 – 2014-04-09 (×4): 3 [IU] via SUBCUTANEOUS
  Administered 2014-04-09 – 2014-04-10 (×2): 2 [IU] via SUBCUTANEOUS

## 2014-04-07 MED ORDER — INSULIN ASPART 100 UNIT/ML ~~LOC~~ SOLN
0.0000 [IU] | Freq: Every day | SUBCUTANEOUS | Status: DC
  Administered 2014-04-07 – 2014-04-09 (×3): 2 [IU] via SUBCUTANEOUS

## 2014-04-07 NOTE — Progress Notes (Signed)
Progress Note   Suzanne Allen [id number] DOB: September 22, 1960 DOA: 04/01/2014 PCP: Default, Provider, MD   Brief Narrative:   Suzanne Allen is an 53 y.o. female with a PMH of type 1 diabetes, hypertension managed with lisinopril/HCTZ, hyperlipidemia, anxiety/depression and prior pancreatitis [id number] of uncertain etiology who was admitted on 04/01/14 with a chief complaint of abdominal pain. Upon initial evaluation in the ED, the patient was noted to have a marked elevation of her serum lipase.   Assessment/Plan:   Principal Problem:   Acute pancreatitis  Status post CT of the abdomen/pelvis 04/02/14 which showed early pancreatitis without pseudocyst formation.  Status post abdominal ultrasound 04/02/14 which was negative for gallstones. The common bile duct was normal in diameter.  Triglyceride levels are WNL.  The patient denies alcohol intake.  At this time, the only possible trigger is her lisinopril/HCTZ which has been associated with pancreatitis and is currently on hold, although she may have passed a stone given her rapid resolution of elevated LFTs and lipase.  Continue pain management and antinausea medications for now.  Ruling out autoimmune pancreatitis with IgG 4 level. IgG 1130, normal IgG 1, 2, 3, 4 pending.   Attempted MRCP to rule out pancreatic divisum, but patient unable to tolerate.  Appreciate Dr Benson Norway assistance. Patient will need EUS outpatient.   LFT trending down.   Continue with current diet.      Diarrhea, Red stool.   Send stool for hemoccult negative time one. /C.difficile PCR negative.   HB stable at 11.9    Elevated LFT / fatty liver  Marked elevation of LFTs, trending down over time, suspect the patient may have passed a stone although there is no CBD dilatation to suggest such.  Elevated LFTs may be from fatty infiltration of the liver. The patient was counseled on diet and exercise.  Acute hepatitis panel was negative. Tylenol levels not  elevated.    Hypokalemia  Replete orally.    Adenopathy, right inguinal and external iliac  Thought to be reactive, but followup CT scan in 12 months recommended.  Check GC/chlamydia probes and HIV serologies.    Acute pulmonary edema  IV fluids at [facility]. Respiratory status improving. Likely from aggressive IV fluids.    History of diabetic osteomyelitis  Status post foot surgery with grafting per Dr. Erling Conte at [facility].  Dressings to be left in place due to the presence of a bioengineered graft until she follows up with her surgeon as an outpatient on 04/16/14.    Diabetes type 1, uncontrolled  Hemoglobin A1c was markedly elevated 10/04/13 at 14.5%. Currently 10.3%.  Currently being managed with moderate scale SS I 3 times a day and Lantus 25 units each bedtime.  CBGs 92-114.    Benign essential HTN  Lisinopril/HCTZ currently on hold. Would discontinue these medications in light of second episode of pancreatitis.    Unspecified hypothyroidism  Continue Synthroid.    Depression/anxiety   Continue Klonopin and Cymbalta.    Dyslipidemia   Continue Zocor.    DVT Prophylaxis  Continue Lovenox.  Code Status: Full. Family Communication: Father updated at the bedside 04/06/14. Disposition Plan: Home when stable.   IV Access:    Peripheral IV   Procedures and diagnostic studies:   Dg Chest 2 View 04/03/2014: Mild pulmonary edema and trace effusions.     Ct Abdomen Pelvis W Contrast 04/02/2014: Mild fatty infiltration of the liver.  Findings consistent with early pancreatitis without pseudocyst formation seen at  this time.  Stable retroperitoneal adenopathy is noted compared to prior exam of 2012. Right inguinal and external iliac adenopathy is noted which is slightly increased compared to prior exam of 2012 ; this most likely is reactive or inflammatory in origin, but followup CT scan in 12 months is recommended to ensure stability or resolution.     US Abdomen Limited 04/02/2014: Fatty infiltration of the liver. No other abnormality seen in the right upper quadrant of the abdomen.    Medical Consultants:    None.   Other Consultants:    Wound care nurse   Anti-Infectives:    Zosyn 04/02/14---> 04/04/14  Subjective:   [name] pain better , requiring less frequent pain medications. At time pain get worse with diet. Diarrhea persist.   Objective:    Filed Vitals:   04/06/14 1400 04/06/14 2046 04/07/14 0517 04/07/14 0753  BP: 147/76 148/74 154/79   Pulse: 70 65 59   Temp: 98.5 F (36.9 C) 98.7 F (37.1 C) 98.2 F (36.8 C)   TempSrc: Oral Oral Oral   Resp: 18 20 20    Height:      Weight:    73.3 kg (161 lb 9.6 oz)  SpO2: 96% 99% 97%     Intake/Output Summary (Last 24 hours) at 04/07/14 1330 Last data filed at 04/07/14 0200  Gross per 24 hour  Intake    460 ml  Output      0 ml  Net    460 ml    Exam: Gen:  NAD Cardiovascular:  RRR, No M/R/G Respiratory:  Lungs CTAB Gastrointestinal:  Abdomen soft, mildly tender, + BS Extremities:  Dressing right foot intact, No C/E/C   Data Reviewed:    Labs: Basic Metabolic Panel:  Recent Labs Lab 04/02/14 0537 04/03/14 0450 04/04/14 0430 04/05/14 0434 04/06/14 1048  NA 138 142 141 143 141  K 3.9 3.2* 3.6* 3.5* 4.4  CL 101 106 103 103 102  CO2 26 26 27 28 27   GLUCOSE 287* 118* 123* 123* 106*  BUN 14 10 9 7 6   CREATININE 0.62 0.70 0.75 0.72 0.70  CALCIUM 8.1* 7.8* 8.2* 8.4 8.6   GFR Estimated Creatinine Clearance: 79 ml/min (by C-G formula based on Cr of 0.7). Liver Function Tests:  Recent Labs Lab 04/02/14 0537 04/03/14 0450 04/04/14 0430 04/05/14 0434 04/06/14 1048  AST 370* 95* 66* 40* 28  ALT 495* 275* 232* 172* 104*  ALKPHOS 175* 145* 158* 162* 133*  BILITOT 0.8 0.6 0.6 0.4 0.3  PROT 6.6 6.3 7.4 7.9 7.2  ALBUMIN 2.8* 2.6* 3.0* 3.2* 3.0*    Recent Labs Lab 04/01/14 2352 04/02/14 0537 04/03/14 0450 04/04/14 0430  04/05/14 0434  LIPASE >3000* 2066* 300* 199* 70*   Coagulation profile  Recent Labs Lab 04/02/14 0537  INR 1.07    CBC:  Recent Labs Lab 04/01/14 2352 04/02/14 0537 04/03/14 0450 04/06/14 1048  WBC 16.6* 11.0* 10.5 8.2  NEUTROABS 12.6* 8.3*  --   --   HGB 13.5 12.3 11.0* 11.9*  HCT 39.3 36.7 32.7* 35.9*  MCV 90.6 90.8 91.6 91.8  PLT 350 308 301 407*   Cardiac Enzymes:  Recent Labs Lab 04/02/14 0537  TROPONINI <0.30   CBG:  Recent Labs Lab 04/06/14 1645 04/06/14 2040 04/07/14 0003 04/07/14 0509 04/07/14 0750  GLUCAP 151* 183* 119* 94 106*   Lipid Profile: No results found for this basename: CHOL, HDL, LDLCALC, TRIG, CHOLHDL, LDLDIRECT,  in the last 72 hours Microbiology Recent Results (  from the past 240 hour(s))  CLOSTRIDIUM DIFFICILE BY PCR     Status: None   Collection Time    04/05/14  5:21 PM      Result Value Ref Range Status   C difficile by pcr NEGATIVE  NEGATIVE Final   Comment: Performed at [facility]     Medications:   . antiseptic oral rinse  7 mL Mouth Rinse BID  . DULoxetine  90 mg Oral Daily  . enoxaparin (LOVENOX) injection  40 mg Subcutaneous Q24H  . insulin aspart  0-15 Units Subcutaneous TID WC  . insulin glargine  25 Units Subcutaneous QHS  . levothyroxine  137 mcg Oral QAC breakfast   Continuous Infusions:   Time spent: 25 minutes.    LOS: 6 days   Niel Hummer A  [facility] Pager [phone].   *Please refer to [email or web address], password [email or web address] to get updated schedule on who will round on this patient, as hospitalists switch teams weekly. If 7PM-7AM, please contact night-coverage at [email or web address], password [email or web address] for any overnight needs.  04/07/2014, 1:30 PM

## 2014-04-08 LAB — COMPREHENSIVE METABOLIC PANEL
ALT: 60 U/L — ABNORMAL HIGH (ref 0–35)
AST: 17 U/L (ref 0–37)
Albumin: 3 g/dL — ABNORMAL LOW (ref 3.5–5.2)
Alkaline Phosphatase: 118 U/L — ABNORMAL HIGH (ref 39–117)
Anion gap: 13 (ref 5–15)
BUN: 5 mg/dL — ABNORMAL LOW (ref 6–23)
CO2: 25 mEq/L (ref 19–32)
Calcium: 9.1 mg/dL (ref 8.4–10.5)
Chloride: 102 mEq/L (ref 96–112)
Creatinine, Ser: 0.68 mg/dL (ref 0.50–1.10)
GFR calc Af Amer: >90 mL/min (ref >90)
GFR calc non Af Amer: >90 mL/min (ref >90)
Glucose, Bld: 114 mg/dL — ABNORMAL HIGH (ref 70–99)
Potassium: 3.6 mEq/L — ABNORMAL LOW (ref 3.7–5.3)
Sodium: 140 mEq/L (ref 137–147)
Total Bilirubin: 0.3 mg/dL (ref 0.3–1.2)
Total Protein: 7.5 g/dL (ref 6.0–8.3)

## 2014-04-08 LAB — GLUCOSE, CAPILLARY
Glucose-Capillary: 97 mg/dL (ref 70–99)
Glucose-Capillary: 179 mg/dL — ABNORMAL HIGH (ref 70–99)
Glucose-Capillary: 157 mg/dL — ABNORMAL HIGH (ref 70–99)

## 2014-04-08 LAB — CBC
HCT: 38.6 % (ref 36.0–46.0)
Hemoglobin: 12.6 g/dL (ref 12.0–15.0)
MCH: 29.4 pg (ref 26.0–34.0)
MCHC: 32.6 g/dL (ref 30.0–36.0)
MCV: 90.2 fL (ref 78.0–100.0)
Platelets: 438 10*3/uL — ABNORMAL HIGH (ref 150–400)
RBC: 4.28 MIL/uL (ref 3.87–5.11)
RDW: 12.9 % (ref 11.5–15.5)
WBC: 7.9 10*3/uL (ref 4.0–10.5)

## 2014-04-08 MED ORDER — POTASSIUM CHLORIDE CRYS ER 20 MEQ PO TBCR
40.0000 meq | EXTENDED_RELEASE_TABLET | Freq: Once | ORAL | Status: AC
  Administered 2014-04-08: 40 meq via ORAL
  Filled 2014-04-08: qty 2

## 2014-04-08 NOTE — Progress Notes (Signed)
Pt orders were for q4 CBG's with no coverage. I called the doctor and had the orders changed from q4 to achs with coverage. [name] S 04/08/2014

## 2014-04-08 NOTE — Progress Notes (Signed)
Progress Note   Suzanne Allen [id number] DOB: 02-Nov-1960 DOA: 04/01/2014 PCP: Default, Provider, MD   Brief Narrative:   Suzanne Allen is an 53 y.o. female with a PMH of type 1 diabetes, hypertension managed with lisinopril/HCTZ, hyperlipidemia, anxiety/depression and prior pancreatitis [id number] of uncertain etiology who was admitted on 04/01/14 with a chief complaint of abdominal pain. Upon initial evaluation in the ED, the patient was noted to have a marked elevation of her serum lipase.   Assessment/Plan:   Principal Problem:   Acute pancreatitis  Status post CT of the abdomen/pelvis 04/02/14 which showed early pancreatitis without pseudocyst formation.  Status post abdominal ultrasound 04/02/14 which was negative for gallstones. The common bile duct was normal in diameter.  Triglyceride levels are WNL.  The patient denies alcohol intake.  At this time, the only possible trigger is her lisinopril/HCTZ which has been associated with pancreatitis and is currently on hold, although she may have passed a stone given her rapid resolution of elevated LFTs and lipase.  Continue pain management and antinausea medications for now.  Ruling out autoimmune pancreatitis with IgG 4 level. IgG 1130, normal IgG 1, 2, 3, 4 pending.   Attempted MRCP to rule out pancreatic divisum, but patient unable to tolerate.  Appreciate Dr Benson Norway assistance. Patient will need EUS outpatient.   LFT trending down.   Pain improving, will try to advance diet today to bland diet.      Diarrhea, Red stool.   Send stool for hemoccult negative time one. /C.difficile PCR negative.   Improved. If doesn't improved might need pancreatic enzymes supplement.   HB stable at 12.    Elevated LFT / fatty liver  Marked elevation of LFTs, trending down over time, suspect the patient may have passed a stone although there is no CBD dilatation to suggest such.  Elevated LFTs may be from fatty infiltration of the  liver. The patient was counseled on diet and exercise.  Acute hepatitis panel was negative. Tylenol levels not elevated.  Need EUS outpatient rule out stone.     Hypokalemia  Replete orally.    Adenopathy, right inguinal and external iliac  Thought to be reactive, but followup CT scan in 12 months recommended.  HIV serologies negative.     Acute pulmonary edema  IV fluids at [facility]. Respiratory status improving. Likely from aggressive IV fluids.    History of diabetic osteomyelitis  Status post foot surgery with grafting per Dr. Erling Conte at [facility].  Dressings to be left in place due to the presence of a bioengineered graft until she follows up with her surgeon as an outpatient on 04/16/14.    Diabetes type 1, uncontrolled  Hemoglobin A1c was markedly elevated 10/04/13 at 14.5%. Currently 10.3%.  Currently being managed with moderate scale SS I 3 times a day and Lantus 25 units each bedtime.  CBGs 106-219    Benign essential HTN  Lisinopril/HCTZ currently on hold. Would discontinue these medications in light of second episode of pancreatitis.  IV hydralazine PRN.     Unspecified hypothyroidism  Continue Synthroid.    Depression/anxiety   Continue Klonopin and Cymbalta.    Dyslipidemia   Continue Zocor.    DVT Prophylaxis  Continue Lovenox.  Code Status: Full. Family Communication: Father updated at the bedside 04/06/14. Disposition Plan: Home when stable.   IV Access:    Peripheral IV   Procedures and diagnostic studies:   Dg Chest 2 View 04/03/2014: Mild pulmonary edema and  trace effusions.     Ct Abdomen Pelvis W Contrast 04/02/2014: Mild fatty infiltration of the liver.  Findings consistent with early pancreatitis without pseudocyst formation seen at this time.  Stable retroperitoneal adenopathy is noted compared to prior exam of 2012. Right inguinal and external iliac adenopathy is noted which is slightly increased compared to prior  exam of 2012 ; this most likely is reactive or inflammatory in origin, but followup CT scan in 12 months is recommended to ensure stability or resolution.    US Abdomen Limited 04/02/2014: Fatty infiltration of the liver. No other abnormality seen in the right upper quadrant of the abdomen.    Medical Consultants:    None.   Other Consultants:    Wound care nurse   Anti-Infectives:    Zosyn 04/02/14---> 04/04/14  Subjective:   Suzanne Allen diarrhea persist but less frequent. She is requiring less frequent pain medication. Pain is not worse when she eats. Still with pain on and off, wake her up. She is willing to try bland diet. No vomiting. Oral pain medication is not strong enough.   Objective:    Filed Vitals:   04/07/14 2002 04/08/14 0547 04/08/14 0607 04/08/14 0716  BP: 160/76 173/89  156/74  Pulse: 70 64    Temp: 98.3 F (36.8 C) 98 F (36.7 C)    TempSrc: Oral Oral    Resp: 18 16    Height:      Weight:   72.3 kg (159 lb 6.3 oz)   SpO2: 98% 98%      Intake/Output Summary (Last 24 hours) at 04/08/14 1224 Last data filed at 04/07/14 1700  Gross per 24 hour  Intake    240 ml  Output      0 ml  Net    240 ml    Exam: Gen:  NAD Cardiovascular:  RRR, No M/R/G Respiratory:  Lungs CTAB Gastrointestinal:  Abdomen soft, mildly tender, + BS Extremities:  Dressing right foot intact, No C/E/C   Data Reviewed:    Labs: Basic Metabolic Panel:  Recent Labs Lab 04/04/14 0430 04/05/14 0434 04/06/14 1048 04/07/14 1402 04/08/14 0521  NA 141 143 141 138 140  K 3.6* 3.5* 4.4 3.9 3.6*  CL 103 103 102 98 102  CO2 27 28 27 26 25   GLUCOSE 123* 123* 106* 243* 114*  BUN 9 7 6  5* 5*  CREATININE 0.75 0.72 0.70 0.72 0.68  CALCIUM 8.2* 8.4 8.6 9.0 9.1   GFR Estimated Creatinine Clearance: 78.4 ml/min (by C-G formula based on Cr of 0.68). Liver Function Tests:  Recent Labs Lab 04/04/14 0430 04/05/14 0434 04/06/14 1048 04/07/14 1402 04/08/14 0521  AST 66*  40* 28 19 17   ALT 232* 172* 104* 70* 60*  ALKPHOS 158* 162* 133* 124* 118*  BILITOT 0.6 0.4 0.3 0.3 0.3  PROT 7.4 7.9 7.2 7.2 7.5  ALBUMIN 3.0* 3.2* 3.0* 3.0* 3.0*    Recent Labs Lab 04/01/14 2352 04/02/14 0537 04/03/14 0450 04/04/14 0430 04/05/14 0434  LIPASE >3000* 2066* 300* 199* 70*   Coagulation profile  Recent Labs Lab 04/02/14 0537  INR 1.07    CBC:  Recent Labs Lab 04/01/14 2352 04/02/14 0537 04/03/14 0450 04/06/14 1048 04/08/14 0521  WBC 16.6* 11.0* 10.5 8.2 7.9  NEUTROABS 12.6* 8.3*  --   --   --   HGB 13.5 12.3 11.0* 11.9* 12.6  HCT 39.3 36.7 32.7* 35.9* 38.6  MCV 90.6 90.8 91.6 91.8 90.2  PLT 350 308 301 407*  438*   Cardiac Enzymes:  Recent Labs Lab 04/02/14 0537  TROPONINI <0.30   CBG:  Recent Labs Lab 04/07/14 1705 04/07/14 1943 04/07/14 2200 04/08/14 0733 04/08/14 1154  GLUCAP 125* 310* 219* 97 179*   Lipid Profile: No results found for this basename: CHOL, HDL, LDLCALC, TRIG, CHOLHDL, LDLDIRECT,  in the last 72 hours Microbiology Recent Results (from the past 240 hour(s))  CLOSTRIDIUM DIFFICILE BY PCR     Status: None   Collection Time    04/05/14  5:21 PM      Result Value Ref Range Status   C difficile by pcr NEGATIVE  NEGATIVE Final   Comment: Performed at [facility]     Medications:   . antiseptic oral rinse  7 mL Mouth Rinse BID  . DULoxetine  90 mg Oral Daily  . enoxaparin (LOVENOX) injection  40 mg Subcutaneous Q24H  . insulin aspart  0-15 Units Subcutaneous TID WC  . insulin aspart  0-5 Units Subcutaneous QHS  . insulin glargine  25 Units Subcutaneous QHS  . levothyroxine  137 mcg Oral QAC breakfast  . potassium chloride  40 mEq Oral Once   Continuous Infusions:   Time spent: 25 minutes.    LOS: 7 days   Niel Hummer A  [facility] Pager [phone].   *Please refer to [email or web address], password [email or web address] to get updated schedule on who will round on this patient, as hospitalists switch teams  weekly. If 7PM-7AM, please contact night-coverage at [email or web address], password [email or web address] for any overnight needs.  04/08/2014, 12:24 PM

## 2014-04-09 ENCOUNTER — Inpatient Hospital Stay (HOSPITAL_COMMUNITY): Payer: Medicaid Other

## 2014-04-09 ENCOUNTER — Encounter (HOSPITAL_COMMUNITY): Payer: Self-pay | Admitting: Radiology

## 2014-04-09 DIAGNOSIS — M869 Osteomyelitis, unspecified: Secondary | ICD-10-CM

## 2014-04-09 DIAGNOSIS — E1169 Type 2 diabetes mellitus with other specified complication: Secondary | ICD-10-CM

## 2014-04-09 DIAGNOSIS — M9080 Osteopathy in diseases classified elsewhere, unspecified site: Secondary | ICD-10-CM

## 2014-04-09 LAB — COMPREHENSIVE METABOLIC PANEL
ALT: 46 U/L — ABNORMAL HIGH (ref 0–35)
ALT: 44 U/L — ABNORMAL HIGH (ref 0–35)
AST: 16 U/L (ref 0–37)
AST: 17 U/L (ref 0–37)
Albumin: 3.1 g/dL — ABNORMAL LOW (ref 3.5–5.2)
Albumin: 3.1 g/dL — ABNORMAL LOW (ref 3.5–5.2)
Alkaline Phosphatase: 115 U/L (ref 39–117)
Alkaline Phosphatase: 112 U/L (ref 39–117)
Anion gap: 14 (ref 5–15)
Anion gap: 14 (ref 5–15)
BUN: 9 mg/dL (ref 6–23)
BUN: 8 mg/dL (ref 6–23)
CO2: 24 mEq/L (ref 19–32)
CO2: 23 mEq/L (ref 19–32)
Calcium: 9.2 mg/dL (ref 8.4–10.5)
Calcium: 9.1 mg/dL (ref 8.4–10.5)
Chloride: 100 mEq/L (ref 96–112)
Chloride: 100 mEq/L (ref 96–112)
Creatinine, Ser: 0.64 mg/dL (ref 0.50–1.10)
Creatinine, Ser: 0.68 mg/dL (ref 0.50–1.10)
GFR calc Af Amer: >90 mL/min (ref >90)
GFR calc Af Amer: >90 mL/min (ref >90)
GFR calc non Af Amer: >90 mL/min (ref >90)
GFR calc non Af Amer: >90 mL/min (ref >90)
Glucose, Bld: 181 mg/dL — ABNORMAL HIGH (ref 70–99)
Glucose, Bld: 138 mg/dL — ABNORMAL HIGH (ref 70–99)
Potassium: 3.8 mEq/L (ref 3.7–5.3)
Potassium: 4.1 mEq/L (ref 3.7–5.3)
Sodium: 138 mEq/L (ref 137–147)
Sodium: 137 mEq/L (ref 137–147)
Total Bilirubin: 0.4 mg/dL (ref 0.3–1.2)
Total Bilirubin: 0.4 mg/dL (ref 0.3–1.2)
Total Protein: 7.4 g/dL (ref 6.0–8.3)
Total Protein: 7.5 g/dL (ref 6.0–8.3)

## 2014-04-09 LAB — GLUCOSE, CAPILLARY
Glucose-Capillary: 220 mg/dL — ABNORMAL HIGH (ref 70–99)
Glucose-Capillary: 129 mg/dL — ABNORMAL HIGH (ref 70–99)
Glucose-Capillary: 146 mg/dL — ABNORMAL HIGH (ref 70–99)
Glucose-Capillary: 180 mg/dL — ABNORMAL HIGH (ref 70–99)
Glucose-Capillary: 229 mg/dL — ABNORMAL HIGH (ref 70–99)

## 2014-04-09 LAB — CBC
HCT: 37.4 % (ref 36.0–46.0)
Hemoglobin: 12.5 g/dL (ref 12.0–15.0)
MCH: 30.2 pg (ref 26.0–34.0)
MCHC: 33.4 g/dL (ref 30.0–36.0)
MCV: 90.3 fL (ref 78.0–100.0)
Platelets: 446 10*3/uL — ABNORMAL HIGH (ref 150–400)
RBC: 4.14 MIL/uL (ref 3.87–5.11)
RDW: 13 % (ref 11.5–15.5)
WBC: 10 10*3/uL (ref 4.0–10.5)

## 2014-04-09 LAB — LIPASE, BLOOD: Lipase: 49 U/L (ref 11–59)

## 2014-04-09 MED ORDER — LORAZEPAM 2 MG/ML IJ SOLN
2.0000 mg | Freq: Once | INTRAMUSCULAR | Status: AC
  Administered 2014-04-09: 2 mg via INTRAVENOUS
  Filled 2014-04-09: qty 1

## 2014-04-09 MED ORDER — IOHEXOL 300 MG/ML  SOLN
25.0000 mL | INTRAMUSCULAR | Status: AC
  Administered 2014-04-09 (×2): 25 mL via ORAL

## 2014-04-09 MED ORDER — AMLODIPINE BESYLATE 5 MG PO TABS
5.0000 mg | ORAL_TABLET | Freq: Every day | ORAL | Status: DC
  Administered 2014-04-09 – 2014-04-10 (×2): 5 mg via ORAL
  Filled 2014-04-09 (×2): qty 1

## 2014-04-09 MED ORDER — OXYCODONE HCL 5 MG PO TABS
5.0000 mg | ORAL_TABLET | ORAL | Status: DC | PRN
  Administered 2014-04-09 – 2014-04-10 (×3): 10 mg via ORAL
  Filled 2014-04-09 (×3): qty 2

## 2014-04-09 MED ORDER — IOHEXOL 300 MG/ML  SOLN
100.0000 mL | Freq: Once | INTRAMUSCULAR | Status: AC | PRN
  Administered 2014-04-09: 100 mL via INTRAVENOUS

## 2014-04-09 NOTE — Progress Notes (Signed)
Progress Note   Suzanne Allen [id number] DOB: April 18, 1961 DOA: 04/01/2014 PCP: Default, Provider, MD   Brief Narrative:   Suzanne Allen is an 53 y.o. female with a PMH of type 1 diabetes, hypertension managed with lisinopril/HCTZ, hyperlipidemia, anxiety/depression and prior pancreatitis [id number] of uncertain etiology who was admitted on 04/01/14 with a chief complaint of abdominal pain. Upon initial evaluation in the ED, the patient was noted to have a marked elevation of her serum lipase.   Assessment/Plan:   Principal Problem:   Acute pancreatitis  Status post CT of the abdomen/pelvis 04/02/14 which showed early pancreatitis without pseudocyst formation.  Status post abdominal ultrasound 04/02/14 which was negative for gallstones. The common bile duct was normal in diameter.  Triglyceride levels are WNL. The patient denies alcohol intake.  At this time, the only possible trigger is her lisinopril/HCTZ which has been associated with pancreatitis and is currently on hold, although she may have passed a stone given her rapid resolution of elevated LFTs and lipase.  Continue pain management and antinausea medications for now.  Ruling out autoimmune pancreatitis with IgG 4 level which is still pending  Attempted MRCP to rule out pancreatic divisum, but patient unable to tolerate.  Seen by Dr. Benson Norway of GI with plans for outpatient EUS.  CT abdomen repeated secondary to patient complaints of ongoing severe abdominal pain.  Active Problems:   Diarrhea  Stool negative for C. Diff PCR.    Elevated LFT / fatty liver  Marked elevation of LFTs, trending down over time, suspect the patient may have passed a stone although there is no CBD dilatation to suggest such.  Elevated LFTs may be from fatty infiltration of the liver. The patient was counseled on diet and exercise.  Acute hepatitis panel was negative. Tylenol levels not elevated.  LFTs now close to normal.     Hypokalemia  Repleted orally.    Adenopathy, right inguinal and external iliac  Thought to be reactive, but followup CT scan in 12 months recommended.  HIV negative.    Acute pulmonary edema  Resolved. Likely from aggressive IV fluids.    History of diabetic osteomyelitis  Status post foot surgery with grafting per Dr. Erling Conte at [facility].  Dressings to be left in place due to the presence of a bioengineered graft until she follows up with her surgeon as an outpatient on 04/16/14.    Diabetes type 1, uncontrolled  Hemoglobin A1c was markedly elevated 10/04/13 at 14.5%. Currently 10.3%.  Currently being managed with moderate scale SSI 3 times a day and Lantus 25 units each bedtime.  CBGs [id number].    Benign essential HTN  Lisinopril/HCTZ currently on hold. Would discontinue these medications in light of second episode of pancreatitis.  Start Norvasc.    Unspecified hypothyroidism  Continue Synthroid.    Depression/anxiety   Continue Klonopin and Cymbalta.    Dyslipidemia   Continue Zocor.    DVT Prophylaxis  Continue Lovenox.  Code Status: Full. Family Communication: Father updated at the bedside. Disposition Plan: Home when stable.   IV Access:    Peripheral IV   Procedures and diagnostic studies:   Dg Chest 2 View 04/03/2014: Mild pulmonary edema and trace effusions.     Ct Abdomen Pelvis W Contrast 04/02/2014: Mild fatty infiltration of the liver.  Findings consistent with early pancreatitis without pseudocyst formation seen at this time.  Stable retroperitoneal adenopathy is noted compared to prior exam of 2012. Right inguinal and external iliac  adenopathy is noted which is slightly increased compared to prior exam of 2012 ; this most likely is reactive or inflammatory in origin, but followup CT scan in 12 months is recommended to ensure stability or resolution.    US Abdomen Limited 04/02/2014: Fatty infiltration of the liver. No other  abnormality seen in the right upper quadrant of the abdomen.    Medical Consultants:    Dr. Carol Ada, GI.   Other Consultants:    Wound care nurse   Anti-Infectives:    Zosyn 04/02/14---> 04/04/14  Subjective:   Suzanne Allen tells me she continues to have severe ongoing abdominal pain unrelieved by oral pain medications and with pain out of proportion to objective findings. Continues to complain of nausea but no frank vomiting noted per nursing staff.    Objective:    Filed Vitals:   04/08/14 2048 04/09/14 0528 04/09/14 0900 04/09/14 1309  BP: 171/72 154/78 144/76 179/93  Pulse:  61 68 75  Temp:  98.2 F (36.8 C) 98 F (36.7 C) 97.6 F (36.4 C)  TempSrc:  Oral Oral Oral  Resp:  16 18 16   Height:      Weight:  72.4 kg (159 lb 9.8 oz)    SpO2:  98% 98% 99%    Intake/Output Summary (Last 24 hours) at 04/09/14 1528 Last data filed at 04/09/14 0930  Gross per 24 hour  Intake    240 ml  Output      0 ml  Net    240 ml    Exam: Gen:  NAD Cardiovascular:  RRR, No M/R/G Respiratory:  Lungs CTAB Gastrointestinal:  Abdomen soft, mildly tender, + BS Extremities:  Dressing right foot intact, No C/E/C   Data Reviewed:    Labs: Basic Metabolic Panel:  Recent Labs Lab 04/06/14 1048 04/07/14 1402 04/08/14 0521 04/09/14 0429 04/09/14 1148  NA 141 138 140 138 137  K 4.4 3.9 3.6* 3.8 4.1  CL 102 98 102 100 100  CO2 27 26 25 24 23   GLUCOSE 106* 243* 114* 181* 138*  BUN 6 5* 5* 9 8  CREATININE 0.70 0.72 0.68 0.64 0.68  CALCIUM 8.6 9.0 9.1 9.2 9.1   GFR Estimated Creatinine Clearance: 78.4 ml/min (by C-G formula based on Cr of 0.68). Liver Function Tests:  Recent Labs Lab 04/06/14 1048 04/07/14 1402 04/08/14 0521 04/09/14 0429 04/09/14 1148  AST 28 19 17 16 17   ALT 104* 70* 60* 46* 44*  ALKPHOS 133* 124* 118* 115 112  BILITOT 0.3 0.3 0.3 0.4 0.4  PROT 7.2 7.2 7.5 7.4 7.5  ALBUMIN 3.0* 3.0* 3.0* 3.1* 3.1*    Recent Labs Lab 04/03/14 0450  04/04/14 0430 04/05/14 0434 04/09/14 1148  LIPASE 300* 199* 70* 49   CBC:  Recent Labs Lab 04/03/14 0450 04/06/14 1048 04/08/14 0521 04/09/14 0429  WBC 10.5 8.2 7.9 10.0  HGB 11.0* 11.9* 12.6 12.5  HCT 32.7* 35.9* 38.6 37.4  MCV 91.6 91.8 90.2 90.3  PLT 301 407* 438* 446*   CBG:  Recent Labs Lab 04/08/14 1154 04/08/14 1706 04/08/14 2053 04/09/14 0800 04/09/14 1156  GLUCAP 179* 157* 220* 129* 146*   Microbiology Recent Results (from the past 240 hour(s))  CLOSTRIDIUM DIFFICILE BY PCR     Status: None   Collection Time    04/05/14  5:21 PM      Result Value Ref Range Status   C difficile by pcr NEGATIVE  NEGATIVE Final   Comment: Performed at [facility]  Medications:   . antiseptic oral rinse  7 mL Mouth Rinse BID  . DULoxetine  90 mg Oral Daily  . enoxaparin (LOVENOX) injection  40 mg Subcutaneous Q24H  . insulin aspart  0-15 Units Subcutaneous TID WC  . insulin aspart  0-5 Units Subcutaneous QHS  . insulin glargine  25 Units Subcutaneous QHS  . levothyroxine  137 mcg Oral QAC breakfast   Continuous Infusions:   Time spent: 25 minutes.    LOS: 8 days   [facility] Pager [phone]. If unable to reach me by pager, please call my cell phone at [phone].  *Please refer to [email or web address], password [email or web address] to get updated schedule on who will round on this patient, as hospitalists switch teams weekly. If 7PM-7AM, please contact night-coverage at [email or web address], password [email or web address] for any overnight needs.  04/09/2014, 3:28 PM

## 2014-04-10 DIAGNOSIS — M5030 Other cervical disc degeneration, unspecified cervical region: Secondary | ICD-10-CM | POA: Diagnosis present

## 2014-04-10 DIAGNOSIS — M5136 Other intervertebral disc degeneration, lumbar region: Secondary | ICD-10-CM | POA: Diagnosis present

## 2014-04-10 LAB — GLUCOSE, CAPILLARY: Glucose-Capillary: 147 mg/dL — ABNORMAL HIGH (ref 70–99)

## 2014-04-10 MED ORDER — OXYCODONE HCL 5 MG PO TABS
10.0000 mg | ORAL_TABLET | ORAL | Status: DC | PRN
  Administered 2014-04-10: 15 mg via ORAL
  Filled 2014-04-10: qty 3

## 2014-04-10 MED ORDER — AMLODIPINE BESYLATE 10 MG PO TABS: 10.0000 mg | ORAL_TABLET | Freq: Every day | ORAL | Status: DC

## 2014-04-10 MED ORDER — OXYCODONE HCL 10 MG PO TABS: 10.0000 mg | ORAL_TABLET | ORAL | Status: DC | PRN

## 2014-04-10 MED ORDER — ONDANSETRON HCL 4 MG PO TABS: 4.0000 mg | ORAL_TABLET | Freq: Four times a day (QID) | ORAL | Status: DC | PRN

## 2014-04-10 NOTE — Discharge Summary (Signed)
Physician Discharge Summary  Suzanne Allen [id number] DOB: March 13, 1961 DOA: 04/01/2014  PCP: Saralyn Pilar  Admit date: 04/01/2014 Discharge date: 04/10/2014   Recommendations for Outpatient Follow-Up:   1. Outpatient followup with Dr. Benson Norway for endoscopic ultrasound. Consider outpatient followup with orthopedic or spinal surgeon if she has ongoing pain radiating to the back not explained by findings on endoscopic ultrasound. 2. Followup IgG 4 level to rule out autoimmune pancreatitis. 3. Note: Lisinopril/HCTZ was discontinued in case it was contributory to her pancreatitis, Norvasc was started, but if an alternative explanation for her pancreatitis is found, may resume these medications. 4. Recommend close outpatient followup if glycemic control given elevated hemoglobin A1c of 10.3%.   Discharge Diagnosis:   Principal Problem:    Acute pancreatitis Active Problems:    Anxiety    Depression    Diabetes type 1, uncontrolled    Benign essential HTN    Unspecified hypothyroidism    Dyslipidemia    Adenopathy, right inguinal and external iliac    Fatty liver    Acute pulmonary edema    Hypokalemia    Elevated LFTs    Diarrhea   Discharge Condition: Improved.  Diet recommendation: Low sodium, heart healthy.  Carbohydrate-modified.     History of Present Illness:   Suzanne Allen is an 53 y.o. female with a PMH of type 1 diabetes, hypertension managed with lisinopril/HCTZ, hyperlipidemia, anxiety/depression and prior pancreatitis [id number] of uncertain etiology who was admitted on 04/01/14 with a chief complaint of abdominal pain. Upon initial evaluation in the ED, the patient was noted to have a marked elevation of her serum lipase.   Hospital Course by Problem:   Principal Problem:  Acute pancreatitis  Status post CT of the abdomen/pelvis 04/02/14 which showed early pancreatitis without pseudocyst formation.  Status post abdominal ultrasound 04/02/14 which was  negative for gallstones. The common bile duct was normal in diameter.  Triglyceride levels are WNL. The patient denied alcohol intake.  At this time, the only possible trigger is her lisinopril/HCTZ which has been associated with pancreatitis and is currently on hold, although she may have passed a stone given her rapid resolution of elevated LFTs and lipase. Her elevation of ALT was felt to be very specific for this etiology. Ruling out autoimmune pancreatitis with IgG 4 level which is still pending  Attempted MRCP to rule out pancreatic divisum, but patient unable to tolerate. Seen by Dr. Benson Norway of GI with plans for outpatient EUS. CT abdomen repeated secondary to patient complaints of ongoing severe abdominal pain with no etiology other than possible degenerative disc disease causing her ongoing pain.  Active Problems:  Degenerative disc disease  Recommend outpatient followup with orthopedic or spinal surgeon if she has ongoing complaints of severe pain radiating to the back out of proportion to physical exam findings.  Diarrhea  Stool negative for C. Diff PCR.  Elevated LFT / fatty liver  Marked elevation of LFTs, trending down over time, suspect the patient may have passed a stone although there is no CBD dilatation to suggest such.  Elevated LFTs may be from fatty infiltration of the liver. The patient was counseled on diet and exercise.  Acute hepatitis panel was negative. Tylenol levels not elevated.  LFTs normal at discharge.  Hypokalemia  Repleted orally.  Adenopathy, right inguinal and external iliac  Thought to be reactive, and followup CT scan does not show adenopathy.  HIV negative.  Acute pulmonary edema  Resolved. Likely from aggressive IV fluids.  History  of diabetic osteomyelitis  Status post foot surgery with grafting per Dr. Erling Conte at [facility].  Dressings to be left in place due to the presence of a bioengineered graft until she follows up with her  surgeon as an outpatient on 04/16/14.  Diabetes type 1, uncontrolled  Hemoglobin A1c was markedly elevated 10/04/13 at 14.5%. Currently 10.3%.  Managed with moderate scale SSI 3 times a day and Lantus 25 units each bedtime.  Recommend close outpatient followup if glycemic control.  Benign essential HTN  Lisinopril/HCTZ currently on hold. Would discontinue these medications in light of second episode of pancreatitis.  Discharged home on Norvasc.  Unspecified hypothyroidism  Continue Synthroid.  Depression/anxiety  Continue Klonopin and Cymbalta.  Dyslipidemia  Continue Zocor.   Medical Consultants:    Dr. Carol Ada, Gastroenterology   Discharge Exam:   Filed Vitals:   04/10/14 0543  BP: 156/87  Pulse: 70  Temp: 97.9 F (36.6 C)  Resp: 16   Filed Vitals:   04/09/14 0900 04/09/14 1309 04/09/14 2057 04/10/14 0543  BP: 144/76 179/93 149/83 156/87  Pulse: 68 75 80 70  Temp: 98 F (36.7 C) 97.6 F (36.4 C) 98 F (36.7 C) 97.9 F (36.6 C)  TempSrc: Oral Oral Oral Oral  Resp: 18 16 16 16   Height:      Weight:    72.5 kg (159 lb 13.3 oz)  SpO2: 98% 99% 97% 96%    Gen:  NAD Cardiovascular:  RRR, No M/R/G Respiratory: Lungs CTAB Gastrointestinal: Abdomen soft, diffusely tender with normal active bowel sounds. Extremities: No C/E/C   The results of significant diagnostics from this hospitalization (including imaging, microbiology, ancillary and laboratory) are listed below for reference.     Procedures and Diagnostic Studies:   Dg Chest 2 View 04/03/2014: Mild pulmonary edema and trace effusions.   Ct Abdomen Pelvis W Contrast 04/02/2014: Mild fatty infiltration of the liver. Findings consistent with early pancreatitis without pseudocyst formation seen at this time. Stable retroperitoneal adenopathy is noted compared to prior exam of 2012. Right inguinal and external iliac adenopathy is noted which is slightly increased compared to prior exam of 2012 ; this most  likely is reactive or inflammatory in origin, but followup CT scan in 12 months is recommended to ensure stability or resolution.   US Abdomen Limited 04/02/2014: Fatty infiltration of the liver. No other abnormality seen in the right upper quadrant of the abdomen.   Ct Abdomen Pelvis W Contrast 04/09/2014: 1. Improving mild pancreatitis without pancreatic necrosis or pseudocyst. 2. Hepatic steatosis.     Labs:   Basic Metabolic Panel:  Recent Labs Lab 04/06/14 1048 04/07/14 1402 04/08/14 0521 04/09/14 0429 04/09/14 1148  NA 141 138 140 138 137  K 4.4 3.9 3.6* 3.8 4.1  CL 102 98 102 100 100  CO2 27 26 25 24 23   GLUCOSE 106* 243* 114* 181* 138*  BUN 6 5* 5* 9 8  CREATININE 0.70 0.72 0.68 0.64 0.68  CALCIUM 8.6 9.0 9.1 9.2 9.1   GFR Estimated Creatinine Clearance: 78.4 ml/min (by C-G formula based on Cr of 0.68). Liver Function Tests:  Recent Labs Lab 04/06/14 1048 04/07/14 1402 04/08/14 0521 04/09/14 0429 04/09/14 1148  AST 28 19 17 16 17   ALT 104* 70* 60* 46* 44*  ALKPHOS 133* 124* 118* 115 112  BILITOT 0.3 0.3 0.3 0.4 0.4  PROT 7.2 7.2 7.5 7.4 7.5  ALBUMIN 3.0* 3.0* 3.0* 3.1* 3.1*    Recent Labs Lab 04/04/14  0430 04/05/14 0434 04/09/14 1148  LIPASE 199* 70* 49   CBC:  Recent Labs Lab 04/06/14 1048 04/08/14 0521 04/09/14 0429  WBC 8.2 7.9 10.0  HGB 11.9* 12.6 12.5  HCT 35.9* 38.6 37.4  MCV 91.8 90.2 90.3  PLT 407* 438* 446*   CBG:  Recent Labs Lab 04/09/14 0800 04/09/14 1156 04/09/14 1701 04/09/14 2121 04/10/14 0757  GLUCAP 129* 146* 180* 229* 147*   Microbiology Recent Results (from the past 240 hour(s))  CLOSTRIDIUM DIFFICILE BY PCR     Status: None   Collection Time    04/05/14  5:21 PM      Result Value Ref Range Status   C difficile by pcr NEGATIVE  NEGATIVE Final   Comment: Performed at [facility]     Discharge Instructions:   Discharge Instructions   Call MD for:  persistant nausea and vomiting    Complete by:  As  directed      Call MD for:  severe uncontrolled pain    Complete by:  As directed      Diet - low sodium heart healthy    Complete by:  As directed   Slowly advance your diet as tolerated.     Discharge instructions    Complete by:  As directed   You were prescribed enough pain medication to get you to your appointment at [facility] on 04/17/14 at 2 pm.  Please note that hospital doctors are unable to provide you with prescription renewals once you are discharged, so it is important that you follow up at your scheduled appointment time to assess your pain and determine an appropriate treatment plan.    Slowly advance your diet as tolerated.  Discontinue your blood pressure medicine.  Norvasc (amlodipine) has been prescribed instead, for blood pressure control.     Increase activity slowly    Complete by:  As directed             Medication List    STOP taking these medications       lisinopril-hydrochlorothiazide 20-25 MG per tablet  Commonly known as:  PRINZIDE,ZESTORETIC      TAKE these medications       amLODipine 10 MG tablet  Commonly known as:  NORVASC  Take 1 tablet (10 mg total) by mouth daily.     clonazePAM 0.5 MG tablet  Commonly known as:  KLONOPIN  Take 0.25 mg by mouth 2 (two) times daily as needed for anxiety.     DULoxetine 30 MG capsule  Commonly known as:  CYMBALTA  Take 90 mg by mouth daily.     insulin aspart 100 UNIT/ML injection  Commonly known as:  novoLOG  Inject 40 Units into the skin 3 (three) times daily before meals.     insulin glargine 100 UNIT/ML injection  Commonly known as:  LANTUS  Inject 40 Units into the skin 2 (two) times daily.     levothyroxine 137 MCG tablet  Commonly known as:  SYNTHROID, LEVOTHROID  Take 137 mcg by mouth daily.     ondansetron 4 MG tablet  Commonly known as:  ZOFRAN  Take 1 tablet (4 mg total) by mouth every 6 (six) hours as needed for nausea.     Oxycodone HCl 10 MG Tabs  Take 1-1.5 tablets  (10-15 mg total) by mouth every 4 (four) hours as needed.     simvastatin 20 MG tablet  Commonly known as:  ZOCOR  Take 20 mg by mouth  at bedtime.           Follow-up Information   Follow up with Powers, Charolotte Eke On 04/17/2014. (You will be seeing Gwenlyn Perking (nurse practitioner) for hospital follow up at 2 pm.)    Specialty:  Family Medicine   Contact information:   [place] [address] [phone]        Time coordinating discharge: 35 minutes.  Signed:  RAMA,CHRISTINA  Pager [phone] [facility] 04/10/2014, 3:59 PM

## 2014-04-10 NOTE — Progress Notes (Signed)
Pt. Has been discharged home. She was given her discharge instructions, prescriptions, and all questions were answered. Pt. Is to be transported home by family.

## 2014-04-17 LAB — IGG 1, 2, 3, AND 4
IgG 4: 69
IgG, Subclass 1: 691
IgG, Subclass 2: 464
IgG, Subclass 3: 89

## 2014-05-01 ENCOUNTER — Ambulatory Visit (INDEPENDENT_AMBULATORY_CARE_PROVIDER_SITE_OTHER): Payer: Medicaid Other | Admitting: Psychiatry

## 2014-05-01 ENCOUNTER — Encounter (HOSPITAL_COMMUNITY): Payer: Self-pay | Admitting: Psychiatry

## 2014-05-01 VITALS — BP 146/89 | HR 101 | Ht 63.0 in | Wt 164.2 lb

## 2014-05-01 DIAGNOSIS — F339 Major depressive disorder, recurrent, unspecified: Secondary | ICD-10-CM

## 2014-05-01 DIAGNOSIS — F330 Major depressive disorder, recurrent, mild: Secondary | ICD-10-CM

## 2014-05-01 DIAGNOSIS — F4310 Post-traumatic stress disorder, unspecified: Secondary | ICD-10-CM

## 2014-05-01 MED ORDER — CLONAZEPAM 0.5 MG PO TABS: ORAL_TABLET | ORAL | Status: DC

## 2014-05-01 MED ORDER — DULOXETINE HCL 60 MG PO CPEP: 60.0000 mg | ORAL_CAPSULE | Freq: Every day | ORAL | Status: DC

## 2014-05-01 NOTE — Progress Notes (Signed)
[facility] Progress Note  Suzanne Allen [id number] 53 y.o.  05/01/2014 11:37 AM  Chief Complaint:  I have a lot of anxiety and panic attack.  I need to go back on Cymbalta.      History of Present Illness: Suzanne Allen came for her followup appointment.  She had stopped taking Cymbalta because of insurance reason .  She had called [place] and she was told that she can gradually cut on Cymbalta and at that time she does not want to try any other medication.  She developed severe anxiety depression and having panic attacks.  She started taking Cymbalta 30 mg however she still have a lot of anxiety and nervousness.  She recently had pancreatitis and she was admitted to the hospital.  She admitted taking Klonopin half tablet in the morning and felt that at that time because she could not sleep.  She wants to try less expensive medication however she felt that Cymbalta helping her chronic pain.  We talked about trying amitriptyline but she does not want to gain weight.  Since she is taking Cymbalta 60 mg she is less irritable, less agitated , less complaining of panic attack.  Her sleeping has improved .  She continues to have some flashback , dreams and nightmares but they are not on every night.  She admitted Klonopin helping her sleep.  She is not drinking or using any illicit substances.  I have referred her to [facility] but patient has mixed feelings about the [place].  She wants to stay in this office and continue to get medication .  She is trying to get some financial help .  She endorse lots of stress because her father has dementia.  She is trying to balance her life and giving enough time to her father.  Her appetite is okay.  Her vitals are stable.  She has difficulty walking because of foot pain.  Suicidal Ideation: No Plan Formed: No Patient has means to carry out plan: No  Homicidal Ideation: No Plan Formed: No Patient has means to carry out plan: No  Review of  Systems: Psychiatric: Agitation: No Hallucination: No Depressed Mood: Yes Insomnia: Yes Hypersomnia: No Altered Concentration: No Feels Worthless: No Grandiose Ideas: No Belief In Special Powers: No New/Increased Substance Abuse: No Compulsions: No  Neurologic: Headache: No Seizure: No Paresthesias: Diabetic neuropathy in both feet  Medical History:   Patient has history of hypothyroidism, insulin-dependent diabetes, diabetic neuropathy, hypertension macular degeneration.    Outpatient Encounter Prescriptions as of 05/01/2014  Medication Sig  . lisinopril-hydrochlorothiazide (PRINZIDE,ZESTORETIC) 20-25 MG per tablet Take 1 tablet by mouth daily.  . clonazePAM (KLONOPIN) 0.5 MG tablet Take 1/2 tab in am and 1 tab at bed time  . DULoxetine (CYMBALTA) 60 MG capsule Take 1 capsule (60 mg total) by mouth daily.  . insulin aspart (NOVOLOG) 100 UNIT/ML injection Inject 40 Units into the skin 3 (three) times daily before meals.  . insulin glargine (LANTUS) 100 UNIT/ML injection Inject 40 Units into the skin 2 (two) times daily.  Marland Kitchen levothyroxine (SYNTHROID, LEVOTHROID) 137 MCG tablet Take 137 mcg by mouth daily.    . ondansetron (ZOFRAN) 4 MG tablet Take 1 tablet (4 mg total) by mouth every 6 (six) hours as needed for nausea.  . simvastatin (ZOCOR) 20 MG tablet Take 20 mg by mouth at bedtime.  . [DISCONTINUED] amLODipine (NORVASC) 10 MG tablet Take 1 tablet (10 mg total) by mouth daily.  . [DISCONTINUED] clonazePAM (KLONOPIN) 0.5 MG tablet  Take 0.25 mg by mouth 2 (two) times daily as needed for anxiety.  . [DISCONTINUED] DULoxetine (CYMBALTA) 30 MG capsule Take 90 mg by mouth daily.  . [DISCONTINUED] Oxycodone HCl 10 MG TABS Take 1-1.5 tablets (10-15 mg total) by mouth every 4 (four) hours as needed.    Past Psychiatric History/Hospitalization(s): Patient has history of depression, anxiety and PTSD. Patient husband committed suicide in 1996. She witnessed when she saw his body hanging.  Patient has taken in the past Effexor Prozac and Celexa.  Anxiety: Yes Bipolar Disorder: No Depression: Yes Mania: No Psychosis: No Schizophrenia: No Personality Disorder: No Hospitalization for psychiatric illness: No History of Electroconvulsive Shock Therapy: No Prior Suicide Attempts: No  Physical Exam: Constitutional:  BP 146/89  Pulse 101  Ht 5\' 3"  (1.6 m)  Wt 164 lb 3.2 oz (74.481 kg)  BMI 29.09 kg/m2  General Appearance: Patient has difficulty walking.  She is wearing cast.   Musculoskeletal: Strength & Muscle Tone: Pain in her foot Gait & Station: Difficulty in walking due to pain Patient leans: N/A  Psychiatric: Speech (describe rate, volume, coherence, spontaneity, and abnormalities if any):  Clear and coherent.  Thought Process (describe rate, content, abstract reasoning, and computation):  Organized logical and goal-directed.  Associations: Coherent and Intact  Thoughts: Preoccupied with her stressors.  Mental Status: Orientation: oriented to person, place, time/date and situation Mood & Affect: anxiety Attention Span & Concentration:  Good  Established Problem, Stable/Improving (1), Review of Last Therapy Session (1) and Review of New Medication or Change in Dosage (2)  Assessment: Axis I:  Maj. depressive disorder, recurrent, PTSD.    Axis II:  Deferred  Axis III:  Patient Active Problem List   Diagnosis Date Noted  . DDD (degenerative disc disease), lumbar 04/10/2014  . Diarrhea 04/05/2014  . Dyslipidemia 04/04/2014  . 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 Fatty liver 04/04/2014  . Acute pulmonary edema 04/04/2014  . Hypokalemia 04/04/2014  . Elevated LFTs 04/04/2014  . Benign essential HTN 04/02/2014  . Unspecified hypothyroidism 04/02/2014  . Leukocytosis, unspecified 10/04/2013  . Acute pancreatitis 10/04/2013  . TIA (transient ischemic attack) 09/23/2012  . Diabetic osteomyelitis 08/29/2012  . Diabetes type 1,  uncontrolled 08/29/2012  . Anxiety 08/24/2011    Axis IV:  Mild to moderate  Axis V:  60-65   Plan:  I reviewed records from the hospital including current medication, blood work results .  She is no longer taking Norvasc .  She is taking lisinopril.  One more time I recommended to see [facility] but patient is not sure at this time.  She is trying to get financial help.  Recommended to increase Cymbalta 60 mg because it is helping her chronic pain and anxiety.  She used to take 90 mg but cannot afford higher dose.  I would continue Klonopin 0.5 mg half tablet in the morning and one tablet at bedtime.  Discussed medication side effects , risk of relapse due to noncompliance , withdrawal symptoms from Cymbalta.  Recommended to call us back if she has any question or any concern.  I will see her again in 2 months unless patient decided to go [place].  Time spent 25 minutes.  More than 50% of the time spent in psychoeducation, counseling and coordination of care.  Discuss safety plan that anytime having active suicidal thoughts or homicidal thoughts then patient need to call 911 or go to the local emergency room.  Anela Bensman T., MD 05/01/2014

## 2014-06-26 ENCOUNTER — Ambulatory Visit (INDEPENDENT_AMBULATORY_CARE_PROVIDER_SITE_OTHER): Payer: Medicaid Other | Admitting: Psychiatry

## 2014-06-26 ENCOUNTER — Encounter (HOSPITAL_COMMUNITY): Payer: Self-pay | Admitting: Psychiatry

## 2014-06-26 VITALS — BP 151/91 | HR 81 | Ht 63.0 in | Wt 166.0 lb

## 2014-06-26 DIAGNOSIS — F330 Major depressive disorder, recurrent, mild: Secondary | ICD-10-CM

## 2014-06-26 DIAGNOSIS — F4310 Post-traumatic stress disorder, unspecified: Secondary | ICD-10-CM

## 2014-06-26 MED ORDER — DULOXETINE HCL 60 MG PO CPEP: 60.0000 mg | ORAL_CAPSULE | Freq: Every day | ORAL | Status: DC

## 2014-06-26 MED ORDER — CLONAZEPAM 0.5 MG PO TABS: ORAL_TABLET | ORAL | Status: DC

## 2014-06-26 NOTE — Progress Notes (Signed)
[facility] Progress Note  Suzanne Allen [id number] 53 y.o.  06/26/2014 10:24 AM  Chief Complaint:  Medication management and follow-up.      History of Present Illness: Suzanne Allen came for her followup appointment.  She is feeling better with Cymbalta 60 mg .  She is sleeping better.  She takes Klonopin half tablet in the morning and 1 at bedtime.  She denies any major panic attack and her anxiety is less intense from the past.  She denies any crying spells or any bad dreams.  She has mixed feeling about future visits with [facility].  She is hoping that her father may help so she can continue to see this Probation officer in the future.  Overall her mood has been stable.  She denies any irritability or any anger.  Her appetite is okay.  Patient denies drinking or using any illegal substances.  She is happy that she does not have to go the emergency room because of chronic foot pain.  She is no longer taking pain medication.  Suicidal Ideation: No Plan Formed: No Patient has means to carry out plan: No  Homicidal Ideation: No Plan Formed: No Patient has means to carry out plan: No  Review of Systems: Psychiatric: Agitation: No Hallucination: No Depressed Mood: No Insomnia: No Hypersomnia: No Altered Concentration: No Feels Worthless: No Grandiose Ideas: No Belief In Special Powers: No New/Increased Substance Abuse: No Compulsions: No  Neurologic: Headache: No Seizure: No Paresthesias: Diabetic neuropathy in both feet  Medical History:   Patient has history of hypothyroidism, insulin-dependent diabetes, diabetic neuropathy, hypertension macular degeneration.    Outpatient Encounter Prescriptions as of 06/26/2014  Medication Sig  . clonazePAM (KLONOPIN) 0.5 MG tablet Take 1/2 tab in am and 1 tab at bed time  . DULoxetine (CYMBALTA) 60 MG capsule Take 1 capsule (60 mg total) by mouth daily.  . insulin aspart (NOVOLOG) 100 UNIT/ML injection Inject 40 Units into the skin 3  (three) times daily before meals.  . insulin glargine (LANTUS) 100 UNIT/ML injection Inject 40 Units into the skin 2 (two) times daily.  Marland Kitchen levothyroxine (SYNTHROID, LEVOTHROID) 137 MCG tablet Take 137 mcg by mouth daily.    Marland Kitchen lisinopril-hydrochlorothiazide (PRINZIDE,ZESTORETIC) 20-25 MG per tablet Take 1 tablet by mouth daily.  . ondansetron (ZOFRAN) 4 MG tablet Take 1 tablet (4 mg total) by mouth every 6 (six) hours as needed for nausea.  . [DISCONTINUED] clonazePAM (KLONOPIN) 0.5 MG tablet Take 1/2 tab in am and 1 tab at bed time  . [DISCONTINUED] DULoxetine (CYMBALTA) 60 MG capsule Take 1 capsule (60 mg total) by mouth daily.  . [DISCONTINUED] simvastatin (ZOCOR) 20 MG tablet Take 20 mg by mouth at bedtime.    Past Psychiatric History/Hospitalization(s): Patient has history of depression, anxiety and PTSD. Patient husband committed suicide in 1996. She witnessed when she saw his body hanging. Patient has taken in the past Effexor Prozac and Celexa.  Anxiety: Yes Bipolar Disorder: No Depression: Yes Mania: No Psychosis: No Schizophrenia: No Personality Disorder: No Hospitalization for psychiatric illness: No History of Electroconvulsive Shock Therapy: No Prior Suicide Attempts: No  Physical Exam: Constitutional:  BP 151/91 mmHg  Pulse 81  Ht 5\' 3"  (1.6 m)  Wt 166 lb (75.297 kg)  BMI 29.41 kg/m2  General Appearance: Patient has difficulty walking.  She is wearing cast.   Musculoskeletal: Strength & Muscle Tone: Pain in her foot Gait & Station: Difficulty in walking due to pain Patient leans: N/A  Psychiatric: Speech (describe rate,  volume, coherence, spontaneity, and abnormalities if any):  Clear and coherent.  Thought Process (describe rate, content, abstract reasoning, and computation):  Organized logical and goal-directed.  Associations: Coherent and Intact  Thoughts: normal  Mental Status: Orientation: oriented to person, place, time/date and situation Mood &  Affect: normal affect Attention Span & Concentration:  Good  Established Problem, Stable/Improving (1), Review of Last Therapy Session (1) and Review of New Medication or Change in Dosage (2)  Assessment: Axis I:  Maj. depressive disorder, recurrent, PTSD.    Axis II:  Deferred  Axis III:  Patient Active Problem List   Diagnosis Date Noted  . DDD (degenerative disc disease), lumbar 04/10/2014  . Diarrhea 04/05/2014  . Dyslipidemia 04/04/2014  . 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 Fatty liver 04/04/2014  . Acute pulmonary edema 04/04/2014  . Hypokalemia 04/04/2014  . Elevated LFTs 04/04/2014  . Benign essential HTN 04/02/2014  . Unspecified hypothyroidism 04/02/2014  . Leukocytosis, unspecified 10/04/2013  . Acute pancreatitis 10/04/2013  . TIA (transient ischemic attack) 09/23/2012  . Diabetic osteomyelitis 08/29/2012  . Diabetes type 1, uncontrolled 08/29/2012  . Anxiety 08/24/2011    Axis IV:  Mild to moderate  Axis V:  60-65   Plan:  Patient is doing better on current dose of Cymbalta 60 mg and Klonopin 0.5 mg in the morning and 1 at bedtime.  She has no side effects.  She is scheduled to see Beverly Sessions on January 12 however if she get financial help then she will call us to schedule appointment in this office.  I recommended to call us back if she has any worsening of the symptoms.  A new position of Cymbalta and Klonopin as given.  At this time we will not schedule appointment however if patient decided to come back she will call us.  Auset Fritzler T., MD 06/26/2014

## 2014-08-20 ENCOUNTER — Ambulatory Visit (HOSPITAL_COMMUNITY): Payer: Self-pay | Admitting: Psychiatry

## 2014-11-15 ENCOUNTER — Encounter: Payer: Self-pay | Admitting: Neurology

## 2014-11-15 ENCOUNTER — Ambulatory Visit (INDEPENDENT_AMBULATORY_CARE_PROVIDER_SITE_OTHER): Payer: Medicaid Other | Admitting: Neurology

## 2014-11-15 VITALS — BP 136/86 | HR 84 | Resp 16 | Ht 63.0 in | Wt 161.4 lb

## 2014-11-15 DIAGNOSIS — E1142 Type 2 diabetes mellitus with diabetic polyneuropathy: Secondary | ICD-10-CM | POA: Insufficient documentation

## 2014-11-15 DIAGNOSIS — R269 Unspecified abnormalities of gait and mobility: Secondary | ICD-10-CM | POA: Insufficient documentation

## 2014-11-15 DIAGNOSIS — M6289 Other specified disorders of muscle: Secondary | ICD-10-CM

## 2014-11-15 DIAGNOSIS — E1042 Type 1 diabetes mellitus with diabetic polyneuropathy: Secondary | ICD-10-CM | POA: Diagnosis not present

## 2014-11-15 DIAGNOSIS — G4700 Insomnia, unspecified: Secondary | ICD-10-CM | POA: Diagnosis not present

## 2014-11-15 DIAGNOSIS — F419 Anxiety disorder, unspecified: Secondary | ICD-10-CM

## 2014-11-15 DIAGNOSIS — R200 Anesthesia of skin: Secondary | ICD-10-CM | POA: Diagnosis not present

## 2014-11-15 DIAGNOSIS — R29898 Other symptoms and signs involving the musculoskeletal system: Secondary | ICD-10-CM | POA: Insufficient documentation

## 2014-11-15 MED ORDER — IMIPRAMINE HCL 25 MG PO TABS: 25.0000 mg | ORAL_TABLET | Freq: Every day | ORAL | Status: DC

## 2014-11-15 NOTE — Progress Notes (Signed)
[facility]  PATIENT: Suzanne Allen DOB: 22-Dec-1960  REFERRING DOCTOR OR PCP:  Gwenlyn Perking  SOURCE: Patient and records from primary care provider and EMR  _________________________________   HISTORICAL  CHIEF COMPLAINT:  Chief Complaint  Patient presents with  . Gait Problem    Sts. 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  She c/o weak grips in both hands but is seeing ortho for this--sts. has tendon damage in both hands related to Diabetes.  She has already had surgery on the left wrist to repair tendon damage.  She also c/o bilat leg weakness, right worse than left, onset about 9 mos. ago.  Sts. she has peripheral neuropathy in her   . Numbness    legs secondary to diabetes and she stumbles and falls often.  Sts. she had a ct head about 18 mos. ago that indicated she was having "mini strokes."  She does report a family hx. of MS./fim    HISTORY OF PRESENT ILLNESS:  I had the pleasure seeing you patient, Suzanne Allen, at [facility] for neurologic consultation regarding her weakness and concern about multiple sclerosis. She is a 54 year old reports a lot of problems with numbness, weakness and stumbling.    She reports she was at an event with some neurologists and they told her she should see someone about the possibility of multiple sclerosis.      She reports that she has had difficulty with her gait that has worsened over the past year. She stumbles a lot and sometimes has fallen. 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 She has a long history of diabetes mellitus and has numbness that goes up to the knee area of both legs. She notes that she is unable to stand unsupported with her eyes closed.   She has difficulty feeling her toes.  Over the past  year, she is also noted numbness that has developed in her fingertips. There is a tingling quality. She also has dropped a lot more items recently than she did in the past. She feels her hand is weaker than it was in the past and she has a poor grip.  Handwriting has not changed.  She notes that her vision is poor but she has diabetic retinopathy and has had multiple laser procedures and cataract surgery in both eyes. She denies any difficulty with her bowel or bladder.  She notes physical more than mental fatigue. She feels her fatigue worsens as the day goes on and she does worse when it is hot. She sleeps poorly with insomnia. She has difficulty with both sleep onset insomnia and sleep maintenance insomnia. The insomnia has also worsened over the past year. She has never been told that she snores or that she has gasping or apnea at night.  She has a long history of both depression and anxiety. However, she has noted much more apathy over the past year. She also has been much more irritable. She feels her memory is worse this year than it was in the past. She has not noted difficulties with processing speed apparent processing or word finding.  She does not recall having a nerve conduction/EMG study. She does see orthopedics for her arm issues and she  has had tendon surgery in the left arm. Her doctor has talked to her about similar surgery on the right.  Her insulin-dependent diabetes mellitus is poorly controlled and she recalls a recent hemoglobin A1c that was 13. Sugars are often high when she has any medical issues. She began to use insulin at age 29.  One of her aunts and her grandmother had multiple sclerosis.   REVIEW OF SYSTEMS: Constitutional: No fevers, chills, sweats, or change in appetite Eyes: She reports blurred vision and eye pain. Ear, nose and throat: No hearing loss, ear pain, nasal congestion, sore throat Cardiovascular: No chest pain, palpitations Respiratory: Occasional  shortness of breath  with exertion.   Occasional wheezes GastrointestinaI: No nausea, vomiting, diarrhea, abdominal pain, fecal incontinence Genitourinary: No dysuria, urinary retention or frequency.  No nocturia. Musculoskeletal: She notes some pain in the joints and muscles. No neck pain, back pain Integumentary: No rash, pruritus, skin lesions Neurological: as above Psychiatric: Notes depression and anxiety.  Also feels she has much more apathy and fatigue. She notes insomnia Endocrine: she has IDDM Hematologic/Lymphatic: No anemia, purpura, petechiae. Allergic/Immunologic: No itchy/runny eyes, nasal congestion, recent allergic reactions, rashes  ALLERGIES: Allergies  Allergen Reactions  . Erythromycin Anaphylaxis  . Morphine And Related Itching    Patient states that she had this in February with no complications  . Vancomycin Other (See Comments)    Red Man Syndrome    HOME MEDICATIONS:  Current outpatient prescriptions:  .  clonazePAM (KLONOPIN) 0.5 MG tablet, Take 1/2 tab in am and 1 tab at bed time, Disp: 45 tablet, Rfl: 1 .  DULoxetine (CYMBALTA) 60 MG capsule, Take 1 capsule (60 mg total) by mouth daily., Disp: 30 capsule, Rfl: 1 .  imipramine (TOFRANIL) 25 MG tablet, Take 1 tablet (25 mg total) by mouth at bedtime., Disp: 30 tablet, Rfl: 5 .  insulin aspart (NOVOLOG) 100 UNIT/ML injection, Inject 40 Units into the skin 3 (three) times daily before meals., Disp: , Rfl:  .  insulin glargine (LANTUS) 100 UNIT/ML injection, Inject 40 Units into the skin 2 (two) times daily., Disp: , Rfl:  .  levothyroxine (SYNTHROID, LEVOTHROID) 137 MCG tablet, Take 137 mcg by mouth daily.  , Disp: , Rfl:  .  lisinopril-hydrochlorothiazide (PRINZIDE,ZESTORETIC) 20-25 MG per tablet, Take 1 tablet by mouth daily., Disp: , Rfl:  .  ondansetron (ZOFRAN) 4 MG tablet, Take 1 tablet (4 mg total) by mouth every 6 (six) hours as needed for nausea., Disp: 30 tablet, Rfl: 0  PAST MEDICAL  HISTORY: Past Medical History  Diagnosis Date  . HTN (hypertension)   . Macular degeneration, bilateral   . Hypercholesteremia   . Hypothyroidism   . Diabetes mellitus type I   . Migraines 1980's    "when I was in my 20's" (09/23/2012)  . Stroke ? date & 09/22/2012  . Peripheral neuropathy   . Anxiety   . Diabetic osteomyelitis     right great toe/progress notes 09/23/2012  . Depression   . PTSD (post-traumatic stress disorder)   . 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 Fatty liver 04/04/2014    PAST SURGICAL HISTORY: Past Surgical History  Procedure Laterality Date  . Cesarean section  1989; 1995  . Tonsillectomy  1965  . Refractive surgery Bilateral 2013  . Total thyroidectomy  2012  . Peripherally inserted central catheter insertion Right 08/31/2012  . I&d extremity Right     "big toe"; multiple  . Toe amputation  FAMILY HISTORY: Family History  Problem Relation Age of Onset  . Depression Mother   . Alcohol abuse Father   . Alcohol abuse Sister     SOCIAL HISTORY:  History   Social History  . Marital Status: Single    Spouse Name: N/A  . Number of Children: N/A  . Years of Education: N/A   Occupational History  . Not on file.   Social History Main Topics  . Smoking status: Never Smoker   . Smokeless tobacco: Never Used  . Alcohol Use: No  . Drug Use: No  . Sexual Activity: Not Currently   Other Topics Concern  . Not on file   Social History Narrative     PHYSICAL EXAM  Filed Vitals:   11/15/14 1439  BP: 136/86  Pulse: 84  Resp: 16  Height: 5\' 3"  (1.6 m)  Weight: 161 lb 6.4 oz (73.211 kg)    Body mass index is 28.6 kg/(m^2).   General: The patient is well-developed and well-nourished and in no acute distress  Eyes:  Funduscopic exam shows normal optic discs on the right but I can' focus well on fundus on left (recent hemorrhage).  Neck: The neck is supple, no carotid bruits are noted.  The neck is  nontender.  Cardiovascular: The heart has a regular rate and rhythm with a normal S1 and S2. There were no murmurs, gallops or rubs. Lungs are clear to auscultation.  Skin: Extremities are without significant edema.   Right great toe amputation.  No ulcers  Musculoskeletal:  Back is nontender  Neurologic Exam  Mental status: The patient is alert and oriented x 3 at the time of the examination. The patient has apparent normal recent (3/3) and remote memory, with an apparently normal attention span (WORLD-DLROW)and concentration ability.   Speech is normal.  Cranial nerves: Extraocular movements are full. Pupils show left APD.   Dec left VA compared to right.  Visual fields are full.  Facial symmetry is present. There is good facial sensation to soft touch bilaterally.Facial strength is normal.  Trapezius and sternocleidomastoid strength is normal. No dysarthria is noted.  The tongue is midline, and the patient has symmetric elevation of the soft palate. No obvious hearing deficits are noted.  Motor:  Muscle bulk is normal.   Tone is normal. Strength is  5 / 5 proximally in all 4 extremities. However she has 4+ over 5 intrinsic hand muscle strength bilaterally and 4/5 toe extension in the feet  Sensory: Sensory testing is intact to pinprick, soft touch and vibration sensation in her arms. However, she has absent vibration sensation at the toes and ankle. She has near absent touch/temp sensation up to mid shin. And reduced sensation the rest of the way to the knee  Coordination: Cerebellar testing reveals good finger-nose-finger and ok heel-to-shin bilaterally.  Gait and station: Station is normal.   Gait is normal but she needs to look down to do tandem gait is mildly wide. Romberg is positive..   Reflexes: Deep tendon reflexes are 1+ in the arms and absent in the legs.   Plantar responses are flexor.    DIAGNOSTIC DATA (LABS, IMAGING, TESTING) - I reviewed patient records, labs, notes,  testing and imaging myself where available.  Lab Results  Component Value Date   WBC 10.0 04/09/2014   HGB 12.5 04/09/2014   HCT 37.4 04/09/2014   MCV 90.3 04/09/2014   PLT 446* 04/09/2014      Component Value Date/Time  NA 137 04/09/2014 1148   K 4.1 04/09/2014 1148   CL 100 04/09/2014 1148   CO2 23 04/09/2014 1148   GLUCOSE 138* 04/09/2014 1148   BUN 8 04/09/2014 1148   CREATININE 0.68 04/09/2014 1148   CREATININE 0.80 09/14/2012 1344   CALCIUM 9.1 04/09/2014 1148   PROT 7.5 04/09/2014 1148   ALBUMIN 3.1* 04/09/2014 1148   AST 17 04/09/2014 1148   ALT 44* 04/09/2014 1148   ALKPHOS 112 04/09/2014 1148   BILITOT 0.4 04/09/2014 1148   GFRNONAA >90 04/09/2014 1148   GFRAA >90 04/09/2014 1148   Lab Results  Component Value Date   CHOL 168 04/02/2014   HDL 43 04/02/2014   LDLCALC 101* 04/02/2014   TRIG 119 04/02/2014   CHOLHDL 3.9 04/02/2014   Lab Results  Component Value Date   HGBA1C 10.3* 04/04/2014   No results found for: VITAMINB12 Lab Results  Component Value Date   TSH 4.027 09/23/2012       ASSESSMENT AND PLAN  Diabetic polyneuropathy associated with type 1 diabetes mellitus - Plan: NCV with EMG(electromyography)  Anxiety  Numbness  Gait disturbance  Hand weakness  Insomnia   In summary, Suzanne Allen is a 54 year old woman who has a long history of insulin-dependent diabetes mellitus who has had more difficulty with gait, feeling, hand strength and other symptoms over the past year. Insomnia. Additionally, she has a long history of mood disturbances and insomnia. I believe that the majority of her symptoms especially the poor gait due to her diabetic polyneuropathy. We need to characterize this better and I will check an NCV/EMG. She could easily have superimposed carpal tunnel syndromes, as well as. I am less concerned about the possibility of MS she is not having exacerbations and diabetic polyneuropathy would better explain her symptoms. If her  neuropathy is just mild, then I think we would need to also check an MRI of the brain to assess for this possibility. To help her sleep and to better help the tingling I will add imipramine at bedtime. If this is not beneficial we will consider another agent.  She will return to see me after the NCV or sooner if she has new or worsening neurologic symptoms.   Kemonte Ullman A. Felecia Shelling, MD, PhD [id number], [id number] PM Certified in Neurology, Clinical Neurophysiology, Sleep Medicine, Pain Medicine and Neuroimaging  [facility] [address], [place], [place] [address] [phone]

## 2014-11-28 ENCOUNTER — Ambulatory Visit (INDEPENDENT_AMBULATORY_CARE_PROVIDER_SITE_OTHER): Payer: Self-pay | Admitting: Neurology

## 2014-11-28 ENCOUNTER — Encounter: Payer: Self-pay | Admitting: Neurology

## 2014-11-28 ENCOUNTER — Ambulatory Visit (INDEPENDENT_AMBULATORY_CARE_PROVIDER_SITE_OTHER): Payer: Medicaid Other | Admitting: Neurology

## 2014-11-28 DIAGNOSIS — E1042 Type 1 diabetes mellitus with diabetic polyneuropathy: Secondary | ICD-10-CM

## 2014-11-28 DIAGNOSIS — G5603 Carpal tunnel syndrome, bilateral upper limbs: Secondary | ICD-10-CM

## 2014-11-28 DIAGNOSIS — G5600 Carpal tunnel syndrome, unspecified upper limb: Secondary | ICD-10-CM | POA: Insufficient documentation

## 2014-11-28 DIAGNOSIS — G5602 Carpal tunnel syndrome, left upper limb: Secondary | ICD-10-CM

## 2014-11-28 DIAGNOSIS — G5601 Carpal tunnel syndrome, right upper limb: Secondary | ICD-10-CM | POA: Diagnosis not present

## 2014-11-28 HISTORY — DX: Carpal tunnel syndrome, unspecified upper limb: G56.00

## 2014-11-28 NOTE — Progress Notes (Signed)
Please refer to EMG and nerve conduction study procedure note. 

## 2014-11-28 NOTE — Procedures (Signed)
     HISTORY:  Suzanne Allen is a 54 year old patient with a history of uncontrolled diabetes with a 7 to 8 year history of numbness below the knees primarily. The patient denies any numbness in the hands or arms. She denies neck pain or pain in the low back or pain down the arms or legs. The patient is being evaluated for a possible peripheral neuropathy.  NERVE CONDUCTION STUDIES:  Nerve conduction studies were performed on both upper extremities. The distal motor latencies for the median nerves were prolonged bilaterally, with normal motor amplitudes for these nerves bilaterally. The distal motor latency for the right ulnar nerve was borderline normal, with a normal motor amplitude. The F wave latencies for the median nerves were borderline normal on the right, normal on the left, and normal for the ulnar nerve on the right. The nerve conduction velocities for the median nerves bilaterally and for the right ulnar nerve were normal. The sensory latencies for the median nerves were prolonged bilaterally, and normal for the right ulnar nerve.  Nerve conduction studies were performed on both lower extremities. The distal motor latencies for the peroneal and posterior tibial nerves were normal bilaterally, with low motor amplitudes for the peroneal nerves bilaterally and for the right posterior tibial nerve. The motor amplitude for the left posterior tibial nerve was normal. Nerve conduction velocities for the peroneal nerves were normal on the right, and slowed below the knee only on the left. The nerve conduction velocities for the posterior tibial nerves were normal bilaterally. The H reflex latencies were unobtainable bilaterally, and the sural and peroneal sensory latencies were unobtainable bilaterally.  EMG STUDIES:  EMG study was performed on the right lower extremity:  The tibialis anterior muscle reveals 2 to 4K motor units with decreased recruitment. No fibrillations or positive waves were  seen. The peroneus tertius muscle reveals 2 to 4K motor units with decreased  recruitment. No fibrillations or positive waves were seen. The medial gastrocnemius muscle reveals 1 to 3K motor units with full recruitment. No fibrillations or positive waves were seen. The vastus lateralis muscle reveals 2 to 4K motor units with full recruitment. No fibrillations or positive waves were seen. The iliopsoas muscle reveals 2 to 4K motor units with full recruitment. No fibrillations or positive waves were seen. The biceps femoris muscle (long head) reveals 2 to 4K motor units with full recruitment. No fibrillations or positive waves were seen. The lumbosacral paraspinal muscles were tested at 3 levels, and revealed no abnormalities of insertional activity at all 3 levels tested. There was good relaxation.   IMPRESSION:  Nerve conduction studies done on all 4 extremities shows evidence of bilateral mild carpal tunnel syndrome. Nerve conduction studies also revealed evidence of a primarily axonal peripheral neuropathy of moderate severity. EMG evaluation of the right lower extremity shows mild distal chronic denervation consistent with the diagnosis of peripheral neuropathy. There is no evidence of an overlying right lumbosacral radiculopathy.  Jill Alexanders MD 11/28/2014 10:47 AM  [facility] [address] [place], [place] [address]  Phone [phone] Fax [phone]

## 2014-11-29 ENCOUNTER — Telehealth: Payer: Self-pay | Admitting: *Deleted

## 2014-11-29 NOTE — Telephone Encounter (Signed)
-----   Message from Britt Bottom, MD sent at 11/29/2014  8:22 AM EDT ----- Nerve study showed carpal tunnel (mild) in both hands -- not bad enough for surgery   And also showed polyneuropathy, most likely from her Diabetes

## 2014-11-29 NOTE — Telephone Encounter (Signed)
I have spoken with Suzanne Allen this morning and per RAS, advised that her ncs showed mild cts bilat, not severe enough for surgery, and some neuropathy, probably from her diabetes.  Suzanne Allen verbalized understanding of same/fim

## 2014-12-21 ENCOUNTER — Encounter (HOSPITAL_COMMUNITY): Payer: Self-pay | Admitting: Psychiatry

## 2014-12-21 ENCOUNTER — Ambulatory Visit (INDEPENDENT_AMBULATORY_CARE_PROVIDER_SITE_OTHER): Payer: Medicaid Other | Admitting: Psychiatry

## 2014-12-21 VITALS — BP 138/86 | HR 104 | Ht 62.0 in | Wt 158.0 lb

## 2014-12-21 DIAGNOSIS — F339 Major depressive disorder, recurrent, unspecified: Secondary | ICD-10-CM

## 2014-12-21 DIAGNOSIS — F4310 Post-traumatic stress disorder, unspecified: Secondary | ICD-10-CM | POA: Diagnosis not present

## 2014-12-21 DIAGNOSIS — F330 Major depressive disorder, recurrent, mild: Secondary | ICD-10-CM

## 2014-12-21 MED ORDER — DULOXETINE HCL 60 MG PO CPEP: 60.0000 mg | ORAL_CAPSULE | Freq: Every day | ORAL | Status: DC

## 2014-12-21 MED ORDER — CLONAZEPAM 0.5 MG PO TABS: ORAL_TABLET | ORAL | Status: DC

## 2014-12-21 NOTE — Progress Notes (Signed)
[facility] Progress Note  Suzanne Allen RT:[id number] y.o.  12/21/2014 1:45 PM  Chief Complaint:  My anxiety is worse.  I do not like [facility] there were giving me BuSpar is not helping.  I have insurance and I want to continue here.        History of Present Illness: Suzanne Allen came for her appointment.  She was last seen in December 2015 and then she decided to go to [facility] because she could not afford coming here.  She does not have insurance.  She did not like [facility] because they did not provide Klonopin.  She was giving BuSpar but she has noticed her anxiety is getting worse.  She recently had procedure for her eye and she has noticed her vision continues to deteriorate.  She wants to go back on Klonopin.  She is taking Cymbalta and denies any side effects.  She is relieved that she has insurance and now she wants to continue coming here.  She is living with her parents.  Patient denies drinking or using any illegal substances.  Recently she has a nerve conduction studies because of neuropathy.  There were no new medication added.  She is monitoring her blood sugar on a regular basis.  Patient is no longer taking any pain medication.  She was to go back on Klonopin due to increase in anxiety level.  She sometimes sleep 3-4 hours.  She admitted nervousness and excessive worrying because of her health issues.  However she denies any suicidal thoughts or homicidal thought.  She denies any feeling of hopelessness or worthlessness.  Her appetite is okay.  Her vitals are stable.  She has high pulse which she believed due to high anxiety level.  Patient denies drinking or using any illegal substances.   Suicidal Ideation: No Plan Formed: No Patient has means to carry out plan: No  Homicidal Ideation: No Plan Formed: No Patient has means to carry out plan: No  Review of Systems  Constitutional: Negative.   Eyes: Positive for blurred vision, pain and redness.  Musculoskeletal:  Negative.   Skin: Negative for itching and rash.  Neurological: Positive for tingling and sensory change. Negative for tremors and headaches.  Psychiatric/Behavioral: The patient is nervous/anxious and has insomnia.     Psychiatric: Agitation: No Hallucination: No Depressed Mood: No Insomnia: No Hypersomnia: No Altered Concentration: No Feels Worthless: No Grandiose Ideas: No Belief In Special Powers: No New/Increased Substance Abuse: No Compulsions: No  Neurologic: Headache: No Seizure: No Paresthesias: Diabetic neuropathy in both feet  Medical History:   Patient has history of hypothyroidism, insulin-dependent diabetes, diabetic neuropathy, hypertension macular degeneration.    Outpatient Encounter Prescriptions as of 12/21/2014  Medication Sig  . amLODipine (NORVASC) 5 MG tablet Take 5 mg by mouth.  . clonazePAM (KLONOPIN) 0.5 MG tablet Take 1/2 tab in am and 1/2 at bed time  . CRESTOR 40 MG tablet TK ONE T PO QD  . DULoxetine (CYMBALTA) 60 MG capsule Take 1 capsule (60 mg total) by mouth daily.  . insulin aspart (NOVOLOG) 100 UNIT/ML injection Inject 40 Units into the skin 3 (three) times daily before meals.  . insulin glargine (LANTUS) 100 UNIT/ML injection Inject 40 Units into the skin 2 (two) times daily.  Marland Kitchen levothyroxine (SYNTHROID, LEVOTHROID) 137 MCG tablet Take 137 mcg by mouth daily.    Marland Kitchen lisinopril-hydrochlorothiazide (PRINZIDE,ZESTORETIC) 20-25 MG per tablet Take 1 tablet by mouth daily.  . ondansetron (ZOFRAN) 4 MG tablet Take 1 tablet (  4 mg total) by mouth every 6 (six) hours as needed for nausea.  . [DISCONTINUED] clonazePAM (KLONOPIN) 0.5 MG tablet Take 1/2 tab in am and 1 tab at bed time  . [DISCONTINUED] DULoxetine (CYMBALTA) 60 MG capsule Take 1 capsule (60 mg total) by mouth daily.  . [DISCONTINUED] imipramine (TOFRANIL) 25 MG tablet Take 1 tablet (25 mg total) by mouth at bedtime.   No facility-administered encounter medications on file as of  12/21/2014.    Past Psychiatric History/Hospitalization(s): Patient has history of depression, anxiety and PTSD. Patient husband committed suicide in 1996. She witnessed when she saw his body hanging. Patient has taken in the past Effexor Prozac and Celexa.  Anxiety: Yes Bipolar Disorder: No Depression: Yes Mania: No Psychosis: No Schizophrenia: No Personality Disorder: No Hospitalization for psychiatric illness: No History of Electroconvulsive Shock Therapy: No Prior Suicide Attempts: No  Physical Exam: Constitutional:  BP 138/86 mmHg  Pulse 104  Ht 5\' 2"  (1.575 m)  Wt 158 lb (71.668 kg)  BMI 28.89 kg/m2  General Appearance: Patient has difficulty walking.  She is wearing cast.   Musculoskeletal: Strength & Muscle Tone: Pain in her foot Gait & Station: Difficulty in walking due to pain Patient leans: N/A  Mental status examination: Patient is casually dressed and groomed.  She appears anxious and nervous.  She described her mood anxious and her affect is constricted.  She maintained fair eye contact.  Her speech is soft, clear and coherent.  Her thought processes logical and goal-directed.  There were no delusions, paranoia or any obsessive thoughts.  She denies any active or passive suicidal thoughts or homicidal thought.  Her fund of knowledge is good.  Her psychomotor activity is slow.  There were no flight of ideas or any loose association.  She denies any tremors or shakes.  She is alert and oriented 3.  Her insight judgment and impulse control is okay.  Established Problem, Stable/Improving (1), Review of Psycho-Social Stressors (1), Review and summation of old records (2), Established Problem, Worsening (2), Review of Last Therapy Session (1), Review of Medication Regimen & Side Effects (2) and Review of New Medication or Change in Dosage (2)  Assessment: Axis I:  Maj. depressive disorder, recurrent, PTSD.    Axis II:  Deferred  Axis III:  Patient Active Problem List    Diagnosis Date Noted  . Carpal tunnel syndrome 11/28/2014  . Diabetes, polyneuropathy 11/15/2014  . Numbness 11/15/2014  . Gait disturbance 11/15/2014  . Hand weakness 11/15/2014  . Insomnia 11/15/2014  . DDD (degenerative disc disease), lumbar 04/10/2014  . Diarrhea 04/05/2014  . Dyslipidemia 04/04/2014  . 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 Fatty liver 04/04/2014  . Acute pulmonary edema 04/04/2014  . Hypokalemia 04/04/2014  . Elevated LFTs 04/04/2014  . Benign essential HTN 04/02/2014  . Unspecified hypothyroidism 04/02/2014  . Leukocytosis, unspecified 10/04/2013  . Acute pancreatitis 10/04/2013  . TIA (transient ischemic attack) 09/23/2012  . Diabetic osteomyelitis 08/29/2012  . Diabetes type 1, uncontrolled 08/29/2012  . Anxiety 08/24/2011    Plan:  I review psychosocial stressors, collateral information and update the history.  Discontinue BuSpar since it is not helping.  She liked to go back on Klonopin.  I will start Klonopin 0.5 mg half tablet twice a day as needed and continue Cymbalta 60 mg.  I offer counseling but patient declined.  I reviewed records from neurology .  Her nerve conduction studies are pending.  Recommended to call us back if  she has any question or any concern.  Follow-up in 2 months.  Time spent 25 minutes.  More than 50% of the time spent his education, counseling and coordination of care.  Discussed benzodiazepine dependence, withdrawal and tolerance.    Suzanne Allen T., MD 12/21/2014

## 2015-02-15 ENCOUNTER — Ambulatory Visit (INDEPENDENT_AMBULATORY_CARE_PROVIDER_SITE_OTHER): Payer: Medicaid Other | Admitting: Neurology

## 2015-02-15 ENCOUNTER — Encounter: Payer: Self-pay | Admitting: Neurology

## 2015-02-15 VITALS — BP 112/77 | HR 78 | Ht 74.0 in | Wt 158.4 lb

## 2015-02-15 DIAGNOSIS — R269 Unspecified abnormalities of gait and mobility: Secondary | ICD-10-CM | POA: Diagnosis not present

## 2015-02-15 DIAGNOSIS — G5601 Carpal tunnel syndrome, right upper limb: Secondary | ICD-10-CM

## 2015-02-15 DIAGNOSIS — G5602 Carpal tunnel syndrome, left upper limb: Secondary | ICD-10-CM

## 2015-02-15 DIAGNOSIS — R200 Anesthesia of skin: Secondary | ICD-10-CM

## 2015-02-15 DIAGNOSIS — F329 Major depressive disorder, single episode, unspecified: Secondary | ICD-10-CM

## 2015-02-15 DIAGNOSIS — G5603 Carpal tunnel syndrome, bilateral upper limbs: Secondary | ICD-10-CM

## 2015-02-15 DIAGNOSIS — E1042 Type 1 diabetes mellitus with diabetic polyneuropathy: Secondary | ICD-10-CM | POA: Diagnosis not present

## 2015-02-15 DIAGNOSIS — F32A Depression, unspecified: Secondary | ICD-10-CM | POA: Insufficient documentation

## 2015-02-15 MED ORDER — LAMOTRIGINE 100 MG PO TABS: 100.0000 mg | ORAL_TABLET | Freq: Every day | ORAL | Status: DC

## 2015-02-15 MED ORDER — LAMOTRIGINE 25 MG PO TABS: ORAL_TABLET | ORAL | Status: DC

## 2015-02-15 NOTE — Progress Notes (Signed)
[facility]  PATIENT: Suzanne Allen DOB: December 27, 1960  REFERRING DOCTOR OR PCP:  Gwenlyn Perking  SOURCE: Patient and records from primary care provider and EMR  _________________________________   HISTORICAL  CHIEF COMPLAINT:  Chief Complaint  Patient presents with  . Follow-up    MS, pt states she has been doing "kind of" ok, no details    HISTORY OF PRESENT ILLNESS:  Suzanne Allen is a 54 year old reports a lot of problems with numbness, weakness and stumbling.    She reports she was at an event with some neurologists and they told her she should see someone about the possibility of multiple sclerosis.      She has had IDDM since age 59.  Control is difficult and HgBA1C is frequently above 10.0.    Sugars are elvated most afternoons, often > 300.     She continues to note some difficulty with her gait.   She stumbles a lot and sometimes has fallen. 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 She has numbness that goes up to the knee area of both legs. She notes that she is unable to stand unsupported with her eyes closed.   She has difficulty feeling her toes.  More recently, there has been  numbness in her fingertips.   Leg numbness is painful.   Lyrica caused weight gain.     Results of NCV/EMG 11/28/14 :  "Nerve conduction studies done on all 4 extremities shows evidence of bilateral mild carpal tunnel syndrome. Nerve conduction studies also revealed evidence of a primarily axonal peripheral neuropathy of moderate severity. EMG evaluation of the right lower extremity shows mild distal chronic denervation consistent with the diagnosis of peripheral neuropathy. There is no evidence of an overlying right lumbosacral radiculopathy."  She notes that her vision is poor but she has diabetic retinopathy and has had multiple laser procedures and cataract surgery in both eyes. She denies any difficulty with her bowel or  bladder.  She reports having 'mini-strokes' 5 year ago associated with a decline in cognition.   She reports an MRI showed she had had a few small strokes  She has depression and anxiety and gets irritable easily and feels she is mean when in a poor mood.   However, she has noted much more apathy over the past year. She also has been much more irritable. She feels her memory is worse this year than it was in the past. She has not noted difficulties with processing speed apparent processing or word finding.  One of her aunts and her grandmother had multiple sclerosis.  I reviewed a CT scan of the head dated 09/22/2012. It shows a possible hypodensity in the right occipital lobe that this is not definite pathologic. There were no lacunar infarctions and brain volume appeared normal.   REVIEW OF SYSTEMS: Constitutional: No fevers, chills, sweats, or change in appetite Eyes: She reports blurred vision and eye pain. Ear, nose and throat: No hearing loss, ear pain, nasal congestion, sore throat Cardiovascular: No chest pain, palpitations Respiratory: Occasional shortness of breath  with exertion.   Occasional wheezes GastrointestinaI: No nausea, vomiting, diarrhea, abdominal pain, fecal incontinence Genitourinary: No dysuria, urinary retention or frequency.  No nocturia. Musculoskeletal: She notes some pain in the joints and muscles. No neck pain, back pain Integumentary: No rash, pruritus, skin lesions Neurological: as above Psychiatric: Notes depression and anxiety.  Also feels she has much  more apathy and fatigue. She notes insomnia Endocrine: she has IDDM Hematologic/Lymphatic: No anemia, purpura, petechiae. Allergic/Immunologic: No itchy/runny eyes, nasal congestion, recent allergic reactions, rashes  ALLERGIES: Allergies  Allergen Reactions  . Erythromycin Anaphylaxis  . Vancomycin Other (See Comments)    Red Man Syndrome    HOME MEDICATIONS:  Current outpatient  prescriptions:  .  amLODipine (NORVASC) 5 MG tablet, Take 5 mg by mouth., Disp: , Rfl:  .  clonazePAM (KLONOPIN) 0.5 MG tablet, Take 1/2 tab in am and 1/2 at bed time, Disp: 30 tablet, Rfl: 1 .  CRESTOR 40 MG tablet, TK ONE T PO QD, Disp: , Rfl: 5 .  DULoxetine (CYMBALTA) 60 MG capsule, Take 1 capsule (60 mg total) by mouth daily., Disp: 30 capsule, Rfl: 1 .  insulin aspart (NOVOLOG) 100 UNIT/ML injection, Inject 40 Units into the skin 3 (three) times daily before meals., Disp: , Rfl:  .  insulin glargine (LANTUS) 100 UNIT/ML injection, Inject 40 Units into the skin 2 (two) times daily., Disp: , Rfl:  .  levothyroxine (SYNTHROID, LEVOTHROID) 137 MCG tablet, Take 137 mcg by mouth daily.  , Disp: , Rfl:  .  lisinopril-hydrochlorothiazide (PRINZIDE,ZESTORETIC) 20-25 MG per tablet, Take 1 tablet by mouth daily., Disp: , Rfl:  .  ondansetron (ZOFRAN) 4 MG tablet, Take 1 tablet (4 mg total) by mouth every 6 (six) hours as needed for nausea., Disp: 30 tablet, Rfl: 0  PAST MEDICAL HISTORY: Past Medical History  Diagnosis Date  . HTN (hypertension)   . Macular degeneration, bilateral   . Hypercholesteremia   . Hypothyroidism   . Diabetes mellitus type I   . Migraines 1980's    "when I was in my 20's" (09/23/2012)  . Stroke ? date & 09/22/2012  . Peripheral neuropathy   . Anxiety   . Diabetic osteomyelitis     right great toe/progress notes 09/23/2012  . Depression   . PTSD (post-traumatic stress disorder)   . 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 Fatty liver 04/04/2014  . Carpal tunnel syndrome 11/28/2014    Bilateral    PAST SURGICAL HISTORY: Past Surgical History  Procedure Laterality Date  . Cesarean section  1989; 1995  . Tonsillectomy  1965  . Refractive surgery Bilateral 2013  . Total thyroidectomy  2012  . Peripherally inserted central catheter insertion Right 08/31/2012  . I&d extremity Right     "big toe"; multiple  . Toe amputation      FAMILY  HISTORY: Family History  Problem Relation Age of Onset  . Depression Mother   . Alcohol abuse Father   . Alcohol abuse Sister     SOCIAL HISTORY:  History   Social History  . Marital Status: Single    Spouse Name: N/A  . Number of Children: N/A  . Years of Education: N/A   Occupational History  . Not on file.   Social History Main Topics  . Smoking status: Never Smoker   . Smokeless tobacco: Never Used  . Alcohol Use: No  . Drug Use: No  . Sexual Activity: Not Currently   Other Topics Concern  . Not on file   Social History Narrative     PHYSICAL EXAM  Filed Vitals:   02/15/15 1045  BP: 112/77  Pulse: 78  Height: 6\' 2"  (1.88 m)  Weight: 158 lb 6.4 oz (71.85 kg)    Body mass index is 20.33 kg/(m^2).   General: The patient is well-developed and well-nourished and in  no acute distress   Neurologic Exam  Mental status: The patient is alert and oriented x 3 at the time of the examination. The patient has apparent normal recent and remote memory,    Speech is normal.  Cranial nerves: Extraocular movements are full.   Facial symmetry is present. There is good facial sensation to soft touch bilaterally.Facial strength is normal.  Trapezius and sternocleidomastoid strength is normal. No dysarthria is noted.  The tongue is midline, and the patient has symmetric elevation of the soft palate. No obvious hearing deficits are noted.  Motor:  Muscle bulk is normal.   Tone is normal. Strength is  5 / 5 proximally in all 4 extremities. However she has 4+ over 5 intrinsic hand muscle strength bilaterally and 4/5 toe extension in the feet  Sensory: Sensory testing is intact to pinprick, soft touch sensation in her arms but decreased vibration in distal fingers. However, she has absent vibration sensation at the toes and ankle. She has near absent touch/temp sensation up to mid shin.    Coordination: Cerebellar testing reveals good finger-nose-finger and ok heel-to-shin  bilaterally.  Gait and station: Station is normal.   Gait is normal but she needs to look down to do tandem gait is mildly wide. Romberg is positive..   Reflexes: Deep tendon reflexes are 1+ in the arms and absent in the legs.      DIAGNOSTIC DATA (LABS, IMAGING, TESTING) - I reviewed patient records, labs, notes, testing and imaging myself where available.  Lab Results  Component Value Date   WBC 10.0 04/09/2014   HGB 12.5 04/09/2014   HCT 37.4 04/09/2014   MCV 90.3 04/09/2014   PLT 446* 04/09/2014      Component Value Date/Time   NA 137 04/09/2014 1148   K 4.1 04/09/2014 1148   CL 100 04/09/2014 1148   CO2 23 04/09/2014 1148   GLUCOSE 138* 04/09/2014 1148   BUN 8 04/09/2014 1148   CREATININE 0.68 04/09/2014 1148   CREATININE 0.80 09/14/2012 1344   CALCIUM 9.1 04/09/2014 1148   PROT 7.5 04/09/2014 1148   ALBUMIN 3.1* 04/09/2014 1148   AST 17 04/09/2014 1148   ALT 44* 04/09/2014 1148   ALKPHOS 112 04/09/2014 1148   BILITOT 0.4 04/09/2014 1148   GFRNONAA >90 04/09/2014 1148   GFRAA >90 04/09/2014 1148   Lab Results  Component Value Date   CHOL 168 04/02/2014   HDL 43 04/02/2014   LDLCALC 101* 04/02/2014   TRIG 119 04/02/2014   CHOLHDL 3.9 04/02/2014   Lab Results  Component Value Date   HGBA1C 10.3* 04/04/2014   No results found for: VITAMINB12 Lab Results  Component Value Date   TSH 4.027 09/23/2012       ASSESSMENT AND PLAN  Diabetic polyneuropathy associated with type 1 diabetes mellitus  Bilateral carpal tunnel syndrome  Gait disturbance - Plan: MR Brain Wo Contrast  Numbness - Plan: MR Brain Wo Contrast  Depression   1.  For her dysesthetic pain, I will start lamotrigine 25 mg and titrate up to 100 mg by mouth twice a day. Instructions were provided. 2.   Diabetes is probably playing a large role in her poor gait due to her polyneuropathy. However, I feel we need to check an MRI of the brain as she also notices some memory loss to make  sure that we are not dealing with another process. 3.   She will continue Cymbalta 60 mg daily. 4.  She will return to  see me in 3-4 months or sooner if she has new or worsening neurologic symptoms.  Natthew Marlatt A. Felecia Shelling, MD, PhD [id number], [id number] AM Certified in Neurology, Clinical Neurophysiology, Sleep Medicine, Pain Medicine and Neuroimaging  [facility] [address] [place], [place] [address] [phone]

## 2015-02-15 NOTE — Patient Instructions (Signed)
The pharmacy has the prescription of lamotrigine 25 mg. Please take it as follows: For 1 week take 1 pill once a day. The second week, take 1 pill twice a day. The third week, take 1 pill in the morning and 2 at bedtime or evening. The fourth week take 2 pills twice a day.  I have also give you a paper prescription for lamotrigine 100 mg tablets after the first 4 weeks you should take 100 mg twice a day.  Most people tolerate lamotrigine very well. Some will get a rash. If you do get a significant rash stop the medicine immediately and let us know.

## 2015-02-21 ENCOUNTER — Ambulatory Visit (INDEPENDENT_AMBULATORY_CARE_PROVIDER_SITE_OTHER): Payer: Medicaid Other | Admitting: Psychiatry

## 2015-02-21 ENCOUNTER — Encounter (HOSPITAL_COMMUNITY): Payer: Self-pay | Admitting: Psychiatry

## 2015-02-21 VITALS — BP 138/88 | HR 84 | Ht 62.0 in | Wt 158.6 lb

## 2015-02-21 DIAGNOSIS — F339 Major depressive disorder, recurrent, unspecified: Secondary | ICD-10-CM | POA: Diagnosis not present

## 2015-02-21 DIAGNOSIS — F4310 Post-traumatic stress disorder, unspecified: Secondary | ICD-10-CM | POA: Diagnosis not present

## 2015-02-21 DIAGNOSIS — F330 Major depressive disorder, recurrent, mild: Secondary | ICD-10-CM

## 2015-02-21 MED ORDER — DULOXETINE HCL 60 MG PO CPEP: 60.0000 mg | ORAL_CAPSULE | Freq: Every day | ORAL | Status: DC

## 2015-02-21 MED ORDER — CLONAZEPAM 0.5 MG PO TABS: ORAL_TABLET | ORAL | Status: DC

## 2015-02-21 NOTE — Progress Notes (Signed)
[facility] Progress Note  Suzanne Allen RT:[id number] y.o.  02/21/2015 1:26 PM  Chief Complaint:  Medication management and follow-up.    History of Present Illness: Suzanne Allen came for her appointment.  She is taking her medication as prescribed.  She is taking Cymbalta and Klonopin 0.5 mg up to 2 times a day.  She used to take 1 a day but sometime when she is very anxious she take second dose.  Recently she is given Lamictal to help her neuropathy.  She just started Lamictal and so far she does not see any improvement but also does not see any side effects.  Patient is concerned about her vision which continues to deteriorate.  However she is handling better.  She is sleeping better.  She denies any nightmares or flashbacks.  She denies drinking or using any illegal substances.  Her appetite is okay.  She denies any major panic attack or any feeling of hopelessness or worthlessness.  Her appetite is okay.  Her vitals are stable  Suicidal Ideation: No Plan Formed: No Patient has means to carry out plan: No  Homicidal Ideation: No Plan Formed: No Patient has means to carry out plan: No  Review of Systems  Constitutional: Negative.   Eyes: Positive for redness.  Musculoskeletal: Negative.   Skin: Negative for itching and rash.  Neurological: Positive for tingling and sensory change. Negative for tremors and headaches.    Psychiatric: Agitation: No Hallucination: No Depressed Mood: No Insomnia: No Hypersomnia: No Altered Concentration: No Feels Worthless: No Grandiose Ideas: No Belief In Special Powers: No New/Increased Substance Abuse: No Compulsions: No  Neurologic: Headache: No Seizure: No Paresthesias: Diabetic neuropathy in both feet  Medical History:   Patient has history of hypothyroidism, insulin-dependent diabetes, diabetic neuropathy, hypertension macular degeneration.    Outpatient Encounter Prescriptions as of 02/21/2015  Medication Sig  .  amLODipine (NORVASC) 5 MG tablet Take 5 mg by mouth.  . clonazePAM (KLONOPIN) 0.5 MG tablet Take 1/2 tab in am and 1/2 at bed time  . CRESTOR 40 MG tablet TK ONE T PO QD  . DULoxetine (CYMBALTA) 60 MG capsule Take 1 capsule (60 mg total) by mouth daily.  . insulin aspart (NOVOLOG) 100 UNIT/ML injection Inject 40 Units into the skin 3 (three) times daily before meals.  . insulin glargine (LANTUS) 100 UNIT/ML injection Inject 40 Units into the skin 2 (two) times daily.  Marland Kitchen lamoTRIgine (LAMICTAL) 100 MG tablet Take 1 tablet (100 mg total) by mouth daily.  Marland Kitchen lamoTRIgine (LAMICTAL) 25 MG tablet 1 po qd x 7d, then 1 po bid x 7d, then 1 po tid x 7d, then 2 po bid x 7 days  . levothyroxine (SYNTHROID, LEVOTHROID) 137 MCG tablet Take 137 mcg by mouth daily.    Marland Kitchen lisinopril-hydrochlorothiazide (PRINZIDE,ZESTORETIC) 20-25 MG per tablet Take 1 tablet by mouth daily.  . ondansetron (ZOFRAN) 4 MG tablet Take 1 tablet (4 mg total) by mouth every 6 (six) hours as needed for nausea.  . [DISCONTINUED] clonazePAM (KLONOPIN) 0.5 MG tablet Take 1/2 tab in am and 1/2 at bed time  . [DISCONTINUED] DULoxetine (CYMBALTA) 60 MG capsule Take 1 capsule (60 mg total) by mouth daily.  . [DISCONTINUED] DULoxetine (CYMBALTA) 60 MG capsule Take 1 capsule (60 mg total) by mouth daily.   No facility-administered encounter medications on file as of 02/21/2015.    Past Psychiatric History/Hospitalization(s): Patient has history of depression, anxiety and PTSD. Patient husband committed suicide in 1996. She witnessed  when she saw his body hanging. Patient has taken in the past Effexor Prozac and Celexa.  Anxiety: Yes Bipolar Disorder: No Depression: Yes Mania: No Psychosis: No Schizophrenia: No Personality Disorder: No Hospitalization for psychiatric illness: No History of Electroconvulsive Shock Therapy: No Prior Suicide Attempts: No  Physical Exam: Constitutional:  BP 138/88 mmHg  Pulse 84  Ht 5\' 2"  (1.575 m)  Wt 158  lb 9.6 oz (71.94 kg)  BMI 29.00 kg/m2  General Appearance: Patient has difficulty walking.  She is wearing cast.   Musculoskeletal: Strength & Muscle Tone: Pain in her foot Gait & Station: Difficulty in walking due to pain Patient leans: N/A  Mental status examination: Patient is casually dressed and groomed.  She appears anxious but cooperative.  She maintained good eye contact.  She described her mood euthymic and her affect is appropriate.  Her speech is soft, clear and coherent.  Her thought processes logical and goal-directed.  There were no delusions, paranoia or any obsessive thoughts.  She denies any active or passive suicidal thoughts or homicidal thought.  Her fund of knowledge is good.  Her psychomotor activity is slow.  There were no flight of ideas or any loose association.  She denies any tremors or shakes.  She is alert and oriented 3.  Her insight judgment and impulse control is okay.  Established Problem, Stable/Improving (1), Review of Psycho-Social Stressors (1), Review of Last Therapy Session (1) and Review of Medication Regimen & Side Effects (2)  Assessment: Axis I:  Maj. depressive disorder, recurrent, PTSD.    Axis II:  Deferred  Axis III:  Patient Active Problem List   Diagnosis Date Noted  . Depression 02/15/2015  . Carpal tunnel syndrome 11/28/2014  . Diabetes, polyneuropathy 11/15/2014  . Numbness 11/15/2014  . Gait disturbance 11/15/2014  . Hand weakness 11/15/2014  . Insomnia 11/15/2014  . DDD (degenerative disc disease), lumbar 04/10/2014  . Diarrhea 04/05/2014  . Dyslipidemia 04/04/2014  . 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 Fatty liver 04/04/2014  . Acute pulmonary edema 04/04/2014  . Hypokalemia 04/04/2014  . Elevated LFTs 04/04/2014  . Benign essential HTN 04/02/2014  . Unspecified hypothyroidism 04/02/2014  . Leukocytosis, unspecified 10/04/2013  . Acute pancreatitis 10/04/2013  . TIA (transient ischemic attack)  09/23/2012  . Diabetic osteomyelitis 08/29/2012  . Diabetes type 1, uncontrolled 08/29/2012  . Anxiety 08/24/2011    Plan:  Patient is a stable on her current medication.  Continue Klonopin 0.5 mg twice a day as needed and Cymbalta 60 mg daily.  She has no tremors or shakes.  Patient just started Lamictal to help neuropathy from a different provider.  Discussed medication side effects and benefits.  Recommended to call us back if she has any question or any concern.  Follow-up in 3 months I offer counseling but patient declined.    Samona Chihuahua T., MD 02/21/2015

## 2015-02-25 ENCOUNTER — Other Ambulatory Visit: Payer: Self-pay | Admitting: Neurology

## 2015-02-27 ENCOUNTER — Other Ambulatory Visit: Payer: Self-pay | Admitting: Neurology

## 2015-03-07 ENCOUNTER — Encounter (INDEPENDENT_AMBULATORY_CARE_PROVIDER_SITE_OTHER): Payer: Medicaid Other | Admitting: Neurology

## 2015-03-07 ENCOUNTER — Ambulatory Visit
Admission: RE | Admit: 2015-03-07 | Discharge: 2015-03-07 | Disposition: A | Discharge status: Home / Self Care | Payer: Self-pay | Source: Ambulatory Visit | Attending: Neurology | Admitting: Neurology

## 2015-03-07 ENCOUNTER — Ambulatory Visit
Admission: RE | Admit: 2015-03-07 | Discharge: 2015-03-07 | Disposition: A | Discharge status: Home / Self Care | Payer: Medicaid Other | Source: Ambulatory Visit | Attending: Neurology | Admitting: Neurology

## 2015-03-07 ENCOUNTER — Other Ambulatory Visit: Payer: Self-pay | Admitting: Neurology

## 2015-03-07 DIAGNOSIS — R200 Anesthesia of skin: Secondary | ICD-10-CM

## 2015-03-07 DIAGNOSIS — R269 Unspecified abnormalities of gait and mobility: Secondary | ICD-10-CM

## 2015-03-07 MED ORDER — GADOBENATE DIMEGLUMINE 529 MG/ML IV SOLN
14.0000 mL | Freq: Once | INTRAVENOUS | Status: AC | PRN
  Administered 2015-03-07: 14 mL via INTRAVENOUS

## 2015-03-08 ENCOUNTER — Telehealth: Payer: Self-pay

## 2015-03-08 NOTE — Telephone Encounter (Signed)
-----   Message from Britt Bottom, MD sent at 03/07/2015  8:46 PM EDT ----- Please let her know that the MRI of the brain was normal.

## 2015-03-08 NOTE — Telephone Encounter (Signed)
LVM for patient to call office to receive MRI results.  Ok to inform patient that MRI was normal.

## 2015-03-08 NOTE — Telephone Encounter (Signed)
LMTC./fim 

## 2015-03-13 ENCOUNTER — Inpatient Hospital Stay (HOSPITAL_COMMUNITY)
Admission: EM | Admit: 2015-03-13 | Discharge: 2015-03-17 | DRG: 314 | Disposition: A | Discharge status: Home / Self Care | Payer: Medicaid Other | Attending: Family Medicine | Admitting: Family Medicine

## 2015-03-13 ENCOUNTER — Emergency Department (HOSPITAL_COMMUNITY): Payer: Medicaid Other

## 2015-03-13 ENCOUNTER — Encounter (HOSPITAL_COMMUNITY): Payer: Self-pay | Admitting: *Deleted

## 2015-03-13 DIAGNOSIS — D72829 Elevated white blood cell count, unspecified: Secondary | ICD-10-CM | POA: Diagnosis present

## 2015-03-13 DIAGNOSIS — B349 Viral infection, unspecified: Secondary | ICD-10-CM | POA: Diagnosis present

## 2015-03-13 DIAGNOSIS — E785 Hyperlipidemia, unspecified: Secondary | ICD-10-CM | POA: Diagnosis present

## 2015-03-13 DIAGNOSIS — J90 Pleural effusion, not elsewhere classified: Secondary | ICD-10-CM | POA: Diagnosis present

## 2015-03-13 DIAGNOSIS — J189 Pneumonia, unspecified organism: Secondary | ICD-10-CM | POA: Diagnosis present

## 2015-03-13 DIAGNOSIS — F329 Major depressive disorder, single episode, unspecified: Secondary | ICD-10-CM | POA: Diagnosis present

## 2015-03-13 DIAGNOSIS — Z881 Allergy status to other antibiotic agents status: Secondary | ICD-10-CM | POA: Diagnosis not present

## 2015-03-13 DIAGNOSIS — R0600 Dyspnea, unspecified: Secondary | ICD-10-CM | POA: Diagnosis present

## 2015-03-13 DIAGNOSIS — H3530 Unspecified macular degeneration: Secondary | ICD-10-CM | POA: Diagnosis present

## 2015-03-13 DIAGNOSIS — E1065 Type 1 diabetes mellitus with hyperglycemia: Secondary | ICD-10-CM | POA: Diagnosis present

## 2015-03-13 DIAGNOSIS — G5601 Carpal tunnel syndrome, right upper limb: Secondary | ICD-10-CM | POA: Diagnosis present

## 2015-03-13 DIAGNOSIS — Z794 Long term (current) use of insulin: Secondary | ICD-10-CM | POA: Diagnosis not present

## 2015-03-13 DIAGNOSIS — J9601 Acute respiratory failure with hypoxia: Secondary | ICD-10-CM | POA: Diagnosis present

## 2015-03-13 DIAGNOSIS — J9811 Atelectasis: Secondary | ICD-10-CM | POA: Diagnosis present

## 2015-03-13 DIAGNOSIS — I319 Disease of pericardium, unspecified: Secondary | ICD-10-CM | POA: Diagnosis not present

## 2015-03-13 DIAGNOSIS — E780 Pure hypercholesterolemia: Secondary | ICD-10-CM | POA: Diagnosis present

## 2015-03-13 DIAGNOSIS — G5602 Carpal tunnel syndrome, left upper limb: Secondary | ICD-10-CM | POA: Diagnosis present

## 2015-03-13 DIAGNOSIS — R599 Enlarged lymph nodes, unspecified: Secondary | ICD-10-CM | POA: Diagnosis present

## 2015-03-13 DIAGNOSIS — E1042 Type 1 diabetes mellitus with diabetic polyneuropathy: Secondary | ICD-10-CM | POA: Diagnosis present

## 2015-03-13 DIAGNOSIS — E890 Postprocedural hypothyroidism: Secondary | ICD-10-CM | POA: Diagnosis present

## 2015-03-13 DIAGNOSIS — Z89411 Acquired absence of right great toe: Secondary | ICD-10-CM

## 2015-03-13 DIAGNOSIS — I313 Pericardial effusion (noninflammatory): Secondary | ICD-10-CM | POA: Diagnosis not present

## 2015-03-13 DIAGNOSIS — K760 Fatty (change of) liver, not elsewhere classified: Secondary | ICD-10-CM | POA: Diagnosis present

## 2015-03-13 DIAGNOSIS — I10 Essential (primary) hypertension: Secondary | ICD-10-CM | POA: Diagnosis present

## 2015-03-13 DIAGNOSIS — E039 Hypothyroidism, unspecified: Secondary | ICD-10-CM

## 2015-03-13 DIAGNOSIS — IMO0002 Reserved for concepts with insufficient information to code with codable children: Secondary | ICD-10-CM | POA: Diagnosis present

## 2015-03-13 DIAGNOSIS — Z23 Encounter for immunization: Secondary | ICD-10-CM

## 2015-03-13 DIAGNOSIS — Z8673 Personal history of transient ischemic attack (TIA), and cerebral infarction without residual deficits: Secondary | ICD-10-CM

## 2015-03-13 DIAGNOSIS — N179 Acute kidney failure, unspecified: Secondary | ICD-10-CM | POA: Diagnosis present

## 2015-03-13 DIAGNOSIS — J9691 Respiratory failure, unspecified with hypoxia: Secondary | ICD-10-CM | POA: Insufficient documentation

## 2015-03-13 DIAGNOSIS — F4310 Post-traumatic stress disorder, unspecified: Secondary | ICD-10-CM | POA: Diagnosis present

## 2015-03-13 DIAGNOSIS — F419 Anxiety disorder, unspecified: Secondary | ICD-10-CM | POA: Diagnosis present

## 2015-03-13 DIAGNOSIS — Z79899 Other long term (current) drug therapy: Secondary | ICD-10-CM

## 2015-03-13 DIAGNOSIS — I3139 Other pericardial effusion (noninflammatory): Secondary | ICD-10-CM | POA: Diagnosis present

## 2015-03-13 HISTORY — DX: Pneumonia, unspecified organism: J18.9

## 2015-03-13 HISTORY — DX: Transient cerebral ischemic attack, unspecified: G45.9

## 2015-03-13 HISTORY — DX: Other chronic pancreatitis: K86.1

## 2015-03-13 HISTORY — DX: Type 2 diabetes mellitus with unspecified diabetic retinopathy without macular edema: E11.319

## 2015-03-13 LAB — COMPREHENSIVE METABOLIC PANEL
ALT: 15 U/L (ref 14–54)
AST: 16 U/L (ref 15–41)
Albumin: 2.9 g/dL — ABNORMAL LOW (ref 3.5–5.0)
Alkaline Phosphatase: 88 U/L (ref 38–126)
Anion gap: 9 (ref 5–15)
BUN: 24 mg/dL — ABNORMAL HIGH (ref 6–20)
CO2: 25 mmol/L (ref 22–32)
Calcium: 8.7 mg/dL — ABNORMAL LOW (ref 8.9–10.3)
Chloride: 94 mmol/L — ABNORMAL LOW (ref 101–111)
Creatinine, Ser: 1.29 mg/dL — ABNORMAL HIGH (ref 0.44–1.00)
GFR calc Af Amer: 54 mL/min — ABNORMAL LOW (ref >60)
GFR calc non Af Amer: 46 mL/min — ABNORMAL LOW (ref >60)
Glucose, Bld: 463 mg/dL — ABNORMAL HIGH (ref 65–99)
Potassium: 3.8 mmol/L (ref 3.5–5.1)
Sodium: 128 mmol/L — ABNORMAL LOW (ref 135–145)
Total Bilirubin: 0.7 mg/dL (ref 0.3–1.2)
Total Protein: 7.2 g/dL (ref 6.5–8.1)

## 2015-03-13 LAB — CBC
HCT: 32.2 % — ABNORMAL LOW (ref 36.0–46.0)
Hemoglobin: 11.1 g/dL — ABNORMAL LOW (ref 12.0–15.0)
MCH: 30 pg (ref 26.0–34.0)
MCHC: 34.5 g/dL (ref 30.0–36.0)
MCV: 87 fL (ref 78.0–100.0)
Platelets: 426 10*3/uL — ABNORMAL HIGH (ref 150–400)
RBC: 3.7 MIL/uL — ABNORMAL LOW (ref 3.87–5.11)
RDW: 11.8 % (ref 11.5–15.5)
WBC: 14.5 10*3/uL — ABNORMAL HIGH (ref 4.0–10.5)

## 2015-03-13 LAB — TROPONIN I: Troponin I: <0.03 ng/mL (ref <0.031)

## 2015-03-13 LAB — GLUCOSE, CAPILLARY: Glucose-Capillary: 396 mg/dL — ABNORMAL HIGH (ref 65–99)

## 2015-03-13 LAB — BRAIN NATRIURETIC PEPTIDE: B Natriuretic Peptide: 132 pg/mL — ABNORMAL HIGH (ref 0.0–100.0)

## 2015-03-13 LAB — TSH: TSH: 5.231 u[IU]/mL — ABNORMAL HIGH (ref 0.350–4.500)

## 2015-03-13 LAB — I-STAT CG4 LACTIC ACID, ED: Lactic Acid, Venous: 1.12 mmol/L (ref 0.5–2.0)

## 2015-03-13 LAB — LIPASE, BLOOD: Lipase: 25 U/L (ref 22–51)

## 2015-03-13 MED ORDER — INSULIN ASPART 100 UNIT/ML ~~LOC~~ SOLN
0.0000 [IU] | Freq: Every day | SUBCUTANEOUS | Status: DC
  Administered 2015-03-13: 5 [IU] via SUBCUTANEOUS
  Administered 2015-03-15: 2 [IU] via SUBCUTANEOUS

## 2015-03-13 MED ORDER — INSULIN ASPART 100 UNIT/ML ~~LOC~~ SOLN
0.0000 [IU] | Freq: Three times a day (TID) | SUBCUTANEOUS | Status: DC
  Administered 2015-03-14: 5 [IU] via SUBCUTANEOUS
  Administered 2015-03-14 (×2): 3 [IU] via SUBCUTANEOUS
  Administered 2015-03-15 – 2015-03-16 (×3): 1 [IU] via SUBCUTANEOUS
  Administered 2015-03-16: 2 [IU] via SUBCUTANEOUS
  Administered 2015-03-17: 1 [IU] via SUBCUTANEOUS

## 2015-03-13 MED ORDER — IOHEXOL 350 MG/ML SOLN
80.0000 mL | Freq: Once | INTRAVENOUS | Status: AC | PRN
  Administered 2015-03-13: 100 mL via INTRAVENOUS

## 2015-03-13 MED ORDER — INSULIN ASPART 100 UNIT/ML ~~LOC~~ SOLN
10.0000 [IU] | Freq: Once | SUBCUTANEOUS | Status: AC
  Administered 2015-03-13: 10 [IU] via SUBCUTANEOUS

## 2015-03-13 MED ORDER — LEVOFLOXACIN IN D5W 750 MG/150ML IV SOLN
750.0000 mg | INTRAVENOUS | Status: DC
  Administered 2015-03-14 – 2015-03-16 (×2): 750 mg via INTRAVENOUS
  Filled 2015-03-13 (×2): qty 150

## 2015-03-13 MED ORDER — DEXTROSE 5 % IV SOLN
1.0000 g | Freq: Once | INTRAVENOUS | Status: AC
  Administered 2015-03-13: 1 g via INTRAVENOUS
  Filled 2015-03-13: qty 10

## 2015-03-13 MED ORDER — GI COCKTAIL ~~LOC~~
30.0000 mL | Freq: Once | ORAL | Status: AC
  Administered 2015-03-13: 30 mL via ORAL
  Filled 2015-03-13: qty 30

## 2015-03-13 MED ORDER — INSULIN ASPART 100 UNIT/ML ~~LOC~~ SOLN
10.0000 [IU] | Freq: Three times a day (TID) | SUBCUTANEOUS | Status: DC
  Administered 2015-03-14 – 2015-03-17 (×11): 10 [IU] via SUBCUTANEOUS

## 2015-03-13 MED ORDER — DOXYCYCLINE HYCLATE 100 MG PO TABS
100.0000 mg | ORAL_TABLET | Freq: Once | ORAL | Status: AC
  Administered 2015-03-13: 100 mg via ORAL
  Filled 2015-03-13: qty 1

## 2015-03-13 MED ORDER — DULOXETINE HCL 60 MG PO CPEP
60.0000 mg | ORAL_CAPSULE | Freq: Every day | ORAL | Status: DC
  Administered 2015-03-14 – 2015-03-17 (×4): 60 mg via ORAL
  Filled 2015-03-13 (×4): qty 1

## 2015-03-13 MED ORDER — INSULIN GLARGINE 100 UNIT/ML ~~LOC~~ SOLN
40.0000 [IU] | Freq: Two times a day (BID) | SUBCUTANEOUS | Status: DC
  Administered 2015-03-13 – 2015-03-17 (×8): 40 [IU] via SUBCUTANEOUS
  Filled 2015-03-13 (×11): qty 0.4

## 2015-03-13 MED ORDER — ROSUVASTATIN CALCIUM 40 MG PO TABS
40.0000 mg | ORAL_TABLET | Freq: Every day | ORAL | Status: DC
  Administered 2015-03-14 – 2015-03-17 (×4): 40 mg via ORAL
  Filled 2015-03-13 (×4): qty 1

## 2015-03-13 MED ORDER — ONDANSETRON HCL 4 MG PO TABS
4.0000 mg | ORAL_TABLET | Freq: Four times a day (QID) | ORAL | Status: DC | PRN
  Administered 2015-03-13: 4 mg via ORAL
  Filled 2015-03-13: qty 1

## 2015-03-13 MED ORDER — LEVOTHYROXINE SODIUM 112 MCG PO TABS
137.0000 ug | ORAL_TABLET | Freq: Every day | ORAL | Status: DC
  Administered 2015-03-14 – 2015-03-17 (×4): 137 ug via ORAL
  Filled 2015-03-13 (×8): qty 1

## 2015-03-13 MED ORDER — HEPARIN SODIUM (PORCINE) 5000 UNIT/ML IJ SOLN
5000.0000 [IU] | Freq: Three times a day (TID) | INTRAMUSCULAR | Status: DC
  Administered 2015-03-13 – 2015-03-14 (×2): 5000 [IU] via SUBCUTANEOUS
  Filled 2015-03-13 (×2): qty 1

## 2015-03-13 MED ORDER — HYDROMORPHONE HCL 1 MG/ML IJ SOLN
1.0000 mg | Freq: Once | INTRAMUSCULAR | Status: AC
  Administered 2015-03-13: 1 mg via INTRAVENOUS
  Filled 2015-03-13: qty 1

## 2015-03-13 MED ORDER — LAMOTRIGINE 100 MG PO TABS
100.0000 mg | ORAL_TABLET | Freq: Every day | ORAL | Status: DC
  Administered 2015-03-14 – 2015-03-17 (×4): 100 mg via ORAL
  Filled 2015-03-13 (×4): qty 1

## 2015-03-13 NOTE — ED Notes (Signed)
Patient undressed, in gown, on monitor, continuous pulse oximetry and blood pressure cuff 

## 2015-03-13 NOTE — ED Notes (Signed)
Patient transported to CT 

## 2015-03-13 NOTE — Progress Notes (Signed)
Pt came from ED to 6N09. Pt A/O x4.

## 2015-03-13 NOTE — ED Notes (Signed)
Patient transported to X-ray 

## 2015-03-13 NOTE — H&P (Addendum)
History and Physical    Suzanne Allen [id number] DOB: 24-Jul-1960 DOA: 03/13/2015  Referring physician: Dr. Vanita Panda PCP: Barrie Lyme, FNP  Specialists: none  Chief Complaint: Shortness of breath  HPI: Suzanne Allen is a 54 y.o. female has a past medical history significant for poorly controlled diabetes, hypertension, hypothyroidism, depression, presents to the emergency room with a chief complaint of shortness of breath and chest pain. This has been going on for 1-2 weeks, progressively getting worse, and today she was barely able to walk due to the shortness of breath. She states that she is okay while she is at rest however every time she tries to move she becomes dyspneic. She denies any cough or sputum production. She denies any fever or chills.  She also endorses chest pain, worse with deep breathing as well as with movement, in the middle of her chest, radiating into her back of her neck. She denies any palpitations. She denies any abdominal pain, nausea, vomiting or diarrhea. She endorses weight gain of about 3 pounds in the last week. She denies any orthopnea or lower extremity swelling. In the emergency room, patient's vital signs are stable, blood work shows elevated glucose to 400s, mild AKI with a creatinine of 1.3, as well as a leukocytosis of 14.5. Chest x-ray shows potential pneumonia, she underwent a CT angiogram which showed no evidence of acute PE, however it did show a small to moderate bilateral pleural effusions as well as small-to-moderate pericardial effusion. She was hypoxic with ambulation in the ED. TRH was asked for admission.   Review of Systems:  as per history of present illness, otherwise 10 point review of systems negative   Past Medical History  Diagnosis Date  . HTN (hypertension)   . Macular degeneration, bilateral   . Hypercholesteremia   . Hypothyroidism   . Diabetes mellitus type I   . Migraines 1980's    "when I was in my 20's" (09/23/2012)  .  Stroke ? date & 09/22/2012  . Peripheral neuropathy   . Anxiety   . Diabetic osteomyelitis     right great toe/progress notes 09/23/2012  . Depression   . PTSD (post-traumatic stress disorder)   . 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 Fatty liver 04/04/2014  . Carpal tunnel syndrome 11/28/2014    Bilateral   Past Surgical History  Procedure Laterality Date  . Cesarean section  1989; 1995  . Tonsillectomy  1965  . Refractive surgery Bilateral 2013  . Total thyroidectomy  2012  . Peripherally inserted central catheter insertion Right 08/31/2012  . I&d extremity Right     "big toe"; multiple  . Toe amputation     Social History:  reports that she has never smoked. She has never used smokeless tobacco. She reports that she does not drink alcohol or use illicit drugs.  Allergies  Allergen Reactions  . Erythromycin Anaphylaxis  . Vancomycin Other (See Comments)    Red Man Syndrome    Family History  Problem Relation Age of Onset  . Depression Mother   . Alcohol abuse Father   . Alcohol abuse Sister     Prior to Admission medications   Medication Sig Start Date End Date Taking? Authorizing Provider  amLODipine (NORVASC) 5 MG tablet Take 5 mg by mouth daily.  07/31/14 07/31/15 Yes Historical Provider, MD  CRESTOR 40 MG tablet Take 1 tablet by mouth daily 11/06/14  Yes Historical Provider, MD  DULoxetine (CYMBALTA) 60 MG capsule Take 1  capsule (60 mg total) by mouth daily. 02/21/15  Yes [name], MD  FOLIC ACID PO Take 1 tablet by mouth daily.   Yes Historical Provider, MD  insulin aspart (NOVOLOG) 100 UNIT/ML injection Inject 20 Units into the skin 3 (three) times daily before meals.    Yes Historical Provider, MD  insulin glargine (LANTUS) 100 UNIT/ML injection Inject 40 Units into the skin 2 (two) times daily.   Yes Historical Provider, MD  lamoTRIgine (LAMICTAL) 100 MG tablet Take 1 tablet (100 mg total) by mouth daily. 02/15/15  Yes Britt Bottom, MD    levothyroxine (SYNTHROID, LEVOTHROID) 137 MCG tablet Take 137 mcg by mouth daily.     Yes Historical Provider, MD  lisinopril-hydrochlorothiazide (PRINZIDE,ZESTORETIC) 20-25 MG per tablet Take 1 tablet by mouth daily.   Yes Historical Provider, MD  milk thistle 175 MG tablet Take 175 mg by mouth daily.   Yes Historical Provider, MD  ondansetron (ZOFRAN) 4 MG tablet Take 1 tablet (4 mg total) by mouth every 6 (six) hours as needed for nausea. 04/10/14  Yes Venetia Maxon Rama, MD  clonazePAM (KLONOPIN) 0.5 MG tablet Take 1/2 tab in am and 1/2 at bed time Patient not taking: Reported on 03/13/2015 02/21/15   [name], MD  lamoTRIgine (LAMICTAL) 25 MG tablet 1 po qd x 7d, then 1 po bid x 7d, then 1 po tid x 7d, then 2 po bid x 7 days 02/15/15   Britt Bottom, MD   Physical Exam: Filed Vitals:   03/13/15 1900 03/13/15 1915 03/13/15 1930 03/13/15 1945  BP: 125/61 126/70 125/64 112/62  Pulse:   83 85  Temp:      TempSrc:      Resp: 21 19 20 19   Height:      Weight:      SpO2:   97% 95%     GENERAL: NAD  HEENT: head NCAT, no scleral icterus. Pupils round and reactive. Mucous membranes are moist. Posterior pharynx clear of any exudate or lesions.  NECK: Supple.   LUNGS: . moves air well No wheezing, mild bibasilar crackles  HEART: Regular rate and rhythm without murmur. 2+ pulses, no peripheral edema  ABDOMEN: Soft, nontender, and nondistended. Positive bowel sounds.   EXTREMITIES: Without any cyanosis, clubbing, rash, lesions or edema.  NEUROLOGIC: Alert and oriented x3. Cranial nerves II through XII are grossly intact. Strength 5/5 in all 4.  PSYCHIATRIC: Normal mood and affect  SKIN: No ulceration or induration present.   Labs on Admission:  Basic Metabolic Panel:  Recent Labs Lab 03/13/15 1520  NA 128*  K 3.8  CL 94*  CO2 25  GLUCOSE 463*  BUN 24*  CREATININE 1.29*  CALCIUM 8.7*   Liver Function Tests:  Recent Labs Lab 03/13/15 1520  AST 16  ALT 15   ALKPHOS 88  BILITOT 0.7  PROT 7.2  ALBUMIN 2.9*    Recent Labs Lab 03/13/15 1520  LIPASE 25   CBC:  Recent Labs Lab 03/13/15 1520  WBC 14.5*  HGB 11.1*  HCT 32.2*  MCV 87.0  PLT 426*   Radiological Exams on Admission: Dg Chest 2 View  03/13/2015   CLINICAL DATA:  54 year old female with shortness of breath and chest pain on the left for 2 weeks. Initial encounter.  EXAM: CHEST  2 VIEW  COMPARISON:  04/03/2014 and earlier.  FINDINGS: Chronic low lung volumes. Stable mild cardiomegaly. Other mediastinal contours are within normal limits. Visualized tracheal air column is  within normal limits. No pneumothorax. No pulmonary edema. Chronic blunting of the costophrenic angles. There is left greater than right streaky increased lower lobe opacity. No consolidation. No acute osseous abnormality identified.  IMPRESSION: Low lung volumes with left greater than right nonspecific streaky lung base opacity. Chronic blunting of the costophrenic angles. The appearance is nonspecific. Lung base pneumonia cannot be excluded.   Electronically Signed   By: Genevie Ann M.D.   On: 03/13/2015 15:21   Ct Angio Chest Pe W/cm &/or Wo Cm  03/13/2015   CLINICAL DATA:  54 year old female with shortness of breath and chest pain.  EXAM: CT ANGIOGRAPHY CHEST WITH CONTRAST  TECHNIQUE: Multidetector CT imaging of the chest was performed using the standard protocol during bolus administration of intravenous contrast. Multiplanar CT image reconstructions and MIPs were obtained to evaluate the vascular anatomy.  CONTRAST:  153mL OMNIPAQUE IOHEXOL 350 MG/ML SOLN  COMPARISON:  None.  FINDINGS: Mediastinum: Normal heart size. There is a small to moderate pericardial effusion, image 59/ series 4. This measures up to 1.7 cm in thickness. The main pulmonary artery is normal. No left or right pulmonary emboli identified. No lobar or segmental pulmonary artery filling defects identified to suggest a clinically significant acute  pulmonary embolus.  Lungs/Pleura: Small to moderate bilateral pleural effusions are identified. Overlying compressive type atelectasis noted.  Upper Abdomen: Hepatic steatosis noted. The visualized portions of the spleen and adrenal glands are unremarkable.  Musculoskeletal: No aggressive lytic or sclerotic bone lesions identified.  Review of the MIP images confirms the above findings.  IMPRESSION: 1. No evidence for acute pulmonary embolus. 2. Pericardial effusion. 3. Bilateral pleural effusions with overlying atelectasis.   Electronically Signed   By: Kerby Moors M.D.   On: 03/13/2015 18:32    EKG: Independently reviewed. sinus rhythm, low voltage throughout   Assessment/Plan Active Problems:   Diabetes type 1, uncontrolled   Leukocytosis   Benign essential HTN   Hypothyroidism   Dyslipidemia   Fatty liver   CAP (community acquired pneumonia)   Dyspnea   Respiratory failure with hypoxia   Pericardial effusion  Acute hypoxic respiratory failure  - Patient has been progressively more dyspneic with exertion in the last few days.  - I am a bit concerned about her pericardial effusion seen on the CT scan, her EKG has overall low voltage, this is less likely pneumonia  - Oxygen as needed   Chest pain - Appears more pleuritic in nature  - Given any radiation into the neck area, she is diabetic, has new pericardial and pleural effusions,  we'll cycle cardiac enzymes overnight   Pericardial and pleural effusions  - She has no history of CHF, we'll obtain a BNP and repeat a 2-D echo  - Hold further IV fluids for now until echo is back, she received a bolus in the ED   Poorly controlled diabetes mellitus with hyperglycemia - Most recent hemoglobin A1c was 10.3 - Her CBGs were elevated to the 400 in the ED, she has no gap, her bicarbonate is normal at 25, give 10 units of short-acting insulin now and resume her home regimen of Lantus plus scheduled mealtime insulin plus sliding scale  -  pseudohyponatremia noted - Repeat an A1c   AKI - probable poor po intake in the past few days - received fluids in the ED, due to concerns above will hold further fluids, allow diet  Possible CAP - Initial chest x-ray with potential for pneumonia, however CT of the chest  is not very convincing. She does have a leukocytosis, will obtain strep pneumo and urine Legionella as well as sputum cultures.  - Empiric Levaquin, consider discontinuation soon once 2-D echo is back   Hypothyroidism  - Check a TSH, resume Synthroid   Hyperlipidemia - Resume statin  Hypertension - Hold Prinzide and Norvasc for now   Diet: heart healthy / carb modified Fluids: none DVT Prophylaxis: heparin  Code Status: Full  Family Communication: no family bedside  Disposition Plan: admit to inpatient    Sheri Prows M. Cruzita Lederer, MD [facility] Pager [phone]  If 7PM-7AM, please contact night-coverage [email or web address] Password [email or web address] 03/13/2015, 8:30 PM

## 2015-03-13 NOTE — Progress Notes (Signed)
  ANTIBIOTIC CONSULT NOTE - INITIAL  Pharmacy Consult for levofloxacin Indication: CAP  Allergies  Allergen Reactions  . Erythromycin Anaphylaxis  . Vancomycin Other (See Comments)    Red Man Syndrome    Patient Measurements: Height: 5\' 2"  (157.5 cm) Weight: 158 lb (71.668 kg) IBW/kg (Calculated) : 50.1   Vital Signs: Temp: 98 F (36.7 C) (08/31 1311) Temp Source: Oral (08/31 1311) BP: 112/62 mmHg (08/31 1945) Pulse Rate: 85 (08/31 1945) Intake/Output from previous day:   Intake/Output from this shift:    Labs:  Recent Labs  03/13/15 1520  WBC 14.5*  HGB 11.1*  PLT 426*  CREATININE 1.29*   Estimated Creatinine Clearance: 46.7 mL/min (by C-G formula based on Cr of 1.29). No results for input(s): VANCOTROUGH, VANCOPEAK, VANCORANDOM, GENTTROUGH, GENTPEAK, GENTRANDOM, TOBRATROUGH, TOBRAPEAK, TOBRARND, AMIKACINPEAK, AMIKACINTROU, AMIKACIN in the last 72 hours.   Microbiology: No results found for this or any previous visit (from the past 720 hour(s)).  Medical History: Past Medical History  Diagnosis Date  . HTN (hypertension)   . Macular degeneration, bilateral   . Hypercholesteremia   . Hypothyroidism   . Diabetes mellitus type I   . Migraines 1980's    "when I was in my 20's" (09/23/2012)  . Stroke ? date & 09/22/2012  . Peripheral neuropathy   . Anxiety   . Diabetic osteomyelitis     right great toe/progress notes 09/23/2012  . Depression   . PTSD (post-traumatic stress disorder)   . 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 Fatty liver 04/04/2014  . Carpal tunnel syndrome 11/28/2014    Bilateral    Assessment: 31 yof with CXR showing LLL PNA. No evidence of PE on CTa. Pharmacy consulted to dose levofloxacin for CAP. Patient received 1x doses of CTX/doxy in the ED tonight. Afebrile, wbc 14.5. SCr 1.29 on admit, CrCL~46.  8/31 levo>> 8/31 CTX x 1 dose 8/31 doxy x 1 dose  8/31 BCx2>> 8/31 sputum>>  Goal of Therapy:  Eradication  of infection  Plan:  Levofloxacin 750mg  IV q48h - to start in the AM Mon clinical progres, c/s, abx LOT/de-escalation plan Mon renal function and may need to adjust dose  Elicia Lamp, PharmD Clinical Pharmacist Pager [phone] 03/13/2015 8:16 PM

## 2015-03-13 NOTE — ED Notes (Signed)
Pt arrives via [facility] from home. Pt states she was dx with pancreatitis yesterday by her PCP. Pt states she has been having pain x4 days and began vomiting todayx4.

## 2015-03-13 NOTE — ED Provider Notes (Signed)
CSN: [id number]     Arrival date & time 03/13/15  1305 History   First MD Initiated Contact with Patient 03/13/15 1306     Chief Complaint  Patient presents with  . Pancreatitis     (Consider location/radiation/quality/duration/timing/severity/associated sxs/prior Treatment) HPI Comments: Went to [facility] for dizziness, SOB on exertion. She feels like her epigastric pain she's been having for 2 weeks is like her prodrome for pancreatitis.  Patient is a 54 y.o. female presenting with abdominal pain. The history is provided by the patient.  Abdominal Pain Pain location:  Epigastric Pain quality: aching and sharp   Pain radiates to:  Does not radiate Pain severity:  Moderate Onset quality:  Gradual Duration:  2 days Timing:  Constant Associated symptoms: nausea and shortness of breath   Associated symptoms: no chills, no cough, no fever and no vomiting     Past Medical History  Diagnosis Date  . HTN (hypertension)   . Macular degeneration, bilateral   . Hypercholesteremia   . Hypothyroidism   . Diabetes mellitus type I   . Migraines 1980's    "when I was in my 20's" (09/23/2012)  . Stroke ? date & 09/22/2012  . Peripheral neuropathy   . Anxiety   . Diabetic osteomyelitis     right great toe/progress notes 09/23/2012  . Depression   . PTSD (post-traumatic stress disorder)   . 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 Fatty liver 04/04/2014  . Carpal tunnel syndrome 11/28/2014    Bilateral   Past Surgical History  Procedure Laterality Date  . Cesarean section  1989; 1995  . Tonsillectomy  1965  . Refractive surgery Bilateral 2013  . Total thyroidectomy  2012  . Peripherally inserted central catheter insertion Right 08/31/2012  . I&d extremity Right     "big toe"; multiple  . Toe amputation     Family History  Problem Relation Age of Onset  . Depression Mother   . Alcohol abuse Father   . Alcohol abuse Sister    Social History   Substance Use Topics  . Smoking status: Never Smoker   . Smokeless tobacco: Never Used  . Alcohol Use: No   OB History    No data available     Review of Systems  Constitutional: Negative for fever and chills.  Respiratory: Positive for shortness of breath. Negative for cough.   Gastrointestinal: Positive for nausea and abdominal pain. Negative for vomiting.  All other systems reviewed and are negative.     Allergies  Erythromycin and Vancomycin  Home Medications   Prior to Admission medications   Medication Sig Start Date End Date Taking? Authorizing Provider  amLODipine (NORVASC) 5 MG tablet Take 5 mg by mouth daily.  07/31/14 07/31/15 Yes Historical Provider, MD  CRESTOR 40 MG tablet Take 1 tablet by mouth daily 11/06/14  Yes Historical Provider, MD  DULoxetine (CYMBALTA) 60 MG capsule Take 1 capsule (60 mg total) by mouth daily. 02/21/15  Yes [name], MD  FOLIC ACID PO Take 1 tablet by mouth daily.   Yes Historical Provider, MD  insulin aspart (NOVOLOG) 100 UNIT/ML injection Inject 20 Units into the skin 3 (three) times daily before meals.    Yes Historical Provider, MD  insulin glargine (LANTUS) 100 UNIT/ML injection Inject 40 Units into the skin 2 (two) times daily.   Yes Historical Provider, MD  lamoTRIgine (LAMICTAL) 100 MG tablet Take 1 tablet (100 mg total) by mouth daily.  02/15/15  Yes Britt Bottom, MD  levothyroxine (SYNTHROID, LEVOTHROID) 137 MCG tablet Take 137 mcg by mouth daily.     Yes Historical Provider, MD  lisinopril-hydrochlorothiazide (PRINZIDE,ZESTORETIC) 20-25 MG per tablet Take 1 tablet by mouth daily.   Yes Historical Provider, MD  milk thistle 175 MG tablet Take 175 mg by mouth daily.   Yes Historical Provider, MD  ondansetron (ZOFRAN) 4 MG tablet Take 1 tablet (4 mg total) by mouth every 6 (six) hours as needed for nausea. 04/10/14  Yes Venetia Maxon Rama, MD  clonazePAM (KLONOPIN) 0.5 MG tablet Take 1/2 tab in am and 1/2 at bed time Patient not  taking: Reported on 03/13/2015 02/21/15   [name], MD  lamoTRIgine (LAMICTAL) 25 MG tablet 1 po qd x 7d, then 1 po bid x 7d, then 1 po tid x 7d, then 2 po bid x 7 days 02/15/15   Britt Bottom, MD   Temp(Src) 98 F (36.7 C) (Oral)  Ht 5\' 2"  (1.575 m)  Wt 158 lb (71.668 kg)  BMI 28.89 kg/m2  SpO2 98% Physical Exam  Constitutional: She is oriented to person, place, and time. She appears well-developed and well-nourished. No distress.  HENT:  Head: Normocephalic and atraumatic.  Mouth/Throat: Oropharynx is clear and moist.  Eyes: EOM are normal. Pupils are equal, round, and reactive to light.  Neck: Normal range of motion. Neck supple.  Cardiovascular: Normal rate and regular rhythm.  Exam reveals no friction rub.   No murmur heard. Pulmonary/Chest: Effort normal and breath sounds normal. No respiratory distress. She has no wheezes. She has no rales.  Abdominal: Soft. She exhibits no distension. There is no tenderness. There is no rebound.  Musculoskeletal: Normal range of motion. She exhibits no edema.  Neurological: She is alert and oriented to person, place, and time.  Skin: No rash noted. She is not diaphoretic.  Nursing note and vitals reviewed.   ED Course  Procedures (including critical care time) Labs Review Labs Reviewed  CBC  COMPREHENSIVE METABOLIC PANEL  LIPASE, BLOOD  I-STAT CG4 LACTIC ACID, ED    Imaging Review Dg Chest 2 View  03/13/2015   CLINICAL DATA:  54 year old female with shortness of breath and chest pain on the left for 2 weeks. Initial encounter.  EXAM: CHEST  2 VIEW  COMPARISON:  04/03/2014 and earlier.  FINDINGS: Chronic low lung volumes. Stable mild cardiomegaly. Other mediastinal contours are within normal limits. Visualized tracheal air column is within normal limits. No pneumothorax. No pulmonary edema. Chronic blunting of the costophrenic angles. There is left greater than right streaky increased lower lobe opacity. No consolidation. No acute  osseous abnormality identified.  IMPRESSION: Low lung volumes with left greater than right nonspecific streaky lung base opacity. Chronic blunting of the costophrenic angles. The appearance is nonspecific. Lung base pneumonia cannot be excluded.   Electronically Signed   By: Genevie Ann M.D.   On: 03/13/2015 15:21   I have personally reviewed and evaluated these images and lab results as part of my medical decision-making.   EKG Interpretation   Date/Time:  Wednesday March 13 2015 13:10:51 EDT Ventricular Rate:  88 PR Interval:  151 QRS Duration: 85 QT Interval:  363 QTC Calculation: 439 R Axis:   -6 Text Interpretation:  Sinus rhythm Low voltage, extremity and precordial  leads Minimal ST elevation, inferior leads No significant change since  last tracing Confirmed by Mingo Amber  MD, [place] ([id number]) on 03/13/2015 1:17:34 PM  MDM   Final diagnoses:  Community acquired pneumonia    33F here with CXR showing PNA in the lingula and LLL. PCP was concerned for possible PE since she hadn't been complaining of fever, chills, or cough. I spoke with her PCP on the phone, Gwenlyn Perking. Will CT to look for PE. Patient is a severely uncontrolled Type I diabetic. CAP coverage provided.  1600 - Care to Dr. Vanita Panda.  Evelina Bucy, MD 03/13/15 [phone]

## 2015-03-14 ENCOUNTER — Encounter (HOSPITAL_COMMUNITY): Payer: Self-pay | Admitting: General Practice

## 2015-03-14 ENCOUNTER — Inpatient Hospital Stay (HOSPITAL_COMMUNITY): Payer: Medicaid Other

## 2015-03-14 DIAGNOSIS — I10 Essential (primary) hypertension: Secondary | ICD-10-CM

## 2015-03-14 DIAGNOSIS — I319 Disease of pericardium, unspecified: Secondary | ICD-10-CM

## 2015-03-14 DIAGNOSIS — J189 Pneumonia, unspecified organism: Secondary | ICD-10-CM

## 2015-03-14 DIAGNOSIS — R0600 Dyspnea, unspecified: Secondary | ICD-10-CM

## 2015-03-14 DIAGNOSIS — E1065 Type 1 diabetes mellitus with hyperglycemia: Secondary | ICD-10-CM

## 2015-03-14 LAB — GLUCOSE, CAPILLARY
Glucose-Capillary: 233 mg/dL — ABNORMAL HIGH (ref 65–99)
Glucose-Capillary: 240 mg/dL — ABNORMAL HIGH (ref 65–99)
Glucose-Capillary: 281 mg/dL — ABNORMAL HIGH (ref 65–99)
Glucose-Capillary: 196 mg/dL — ABNORMAL HIGH (ref 65–99)

## 2015-03-14 LAB — TROPONIN I
Troponin I: <0.03 ng/mL (ref <0.031)
Troponin I: <0.03 ng/mL (ref <0.031)

## 2015-03-14 LAB — STREP PNEUMONIAE URINARY ANTIGEN: Strep Pneumo Urinary Antigen: NEGATIVE

## 2015-03-14 MED ORDER — INFLUENZA VAC SPLIT QUAD 0.5 ML IM SUSY
0.5000 mL | PREFILLED_SYRINGE | Freq: Once | INTRAMUSCULAR | Status: AC
  Administered 2015-03-15: 0.5 mL via INTRAMUSCULAR
  Filled 2015-03-14: qty 0.5

## 2015-03-14 MED ORDER — IBUPROFEN 600 MG PO TABS
600.0000 mg | ORAL_TABLET | Freq: Three times a day (TID) | ORAL | Status: DC
  Administered 2015-03-14 – 2015-03-17 (×9): 600 mg via ORAL
  Filled 2015-03-14 (×9): qty 1

## 2015-03-14 MED ORDER — CLONAZEPAM 0.5 MG PO TABS
0.5000 mg | ORAL_TABLET | Freq: Once | ORAL | Status: AC
  Administered 2015-03-14: 0.5 mg via ORAL
  Filled 2015-03-14: qty 1

## 2015-03-14 MED ORDER — COLCHICINE 0.6 MG PO TABS
0.6000 mg | ORAL_TABLET | Freq: Every day | ORAL | Status: DC
  Administered 2015-03-14 – 2015-03-17 (×4): 0.6 mg via ORAL
  Filled 2015-03-14 (×4): qty 1

## 2015-03-14 MED ORDER — PANTOPRAZOLE SODIUM 40 MG PO TBEC
40.0000 mg | DELAYED_RELEASE_TABLET | Freq: Every day | ORAL | Status: DC
  Administered 2015-03-14 – 2015-03-17 (×4): 40 mg via ORAL
  Filled 2015-03-14 (×4): qty 1

## 2015-03-14 MED ORDER — OXYCODONE-ACETAMINOPHEN 5-325 MG PO TABS
1.0000 | ORAL_TABLET | Freq: Once | ORAL | Status: AC
  Administered 2015-03-14: 1 via ORAL
  Filled 2015-03-14: qty 1

## 2015-03-14 NOTE — Progress Notes (Signed)
[facility] PROGRESS NOTE  Bereniz Tocci [id number] DOB: 1961-02-11 DOA: 03/13/2015 PCP: Barrie Lyme, FNP  Assessment/Plan: Active Problems:   Chest pain - Echocardiogram results discussed with cardiology - Given echo results consulted cardiology - Troponins negative 3  Diabetes type 1, uncontrolled -Continue Lantus, sliding scale insulin - Diabetic diet    Leukocytosis - On Levaquin - CT results is not convincing for pneumonia - May be reactive     Hypothyroidism - continue synthroid    Dyslipidemia - continue statin    Dyspnea - most likely due to effusions   Respiratory failure with hypoxia   Pericardial effusion   Code Status: full3 Family Communication: no faimly at bedside  Disposition Plan: pending improvement in condition, repeat echocardiogram in 48 hours   Consultants:  Cardiology  Procedures:  echocardiogram  Antibiotics:  Levaquin  HPI/Subjective: Pt has no new complaints  Objective: Filed Vitals:   03/14/15 1429  BP: 111/65  Pulse: 96  Temp: 97.9 F (36.6 C)  Resp: 16    Intake/Output Summary (Last 24 hours) at 03/14/15 1741 Last data filed at 03/14/15 1430  Gross per 24 hour  Intake    820 ml  Output    300 ml  Net    520 ml   Filed Weights   03/13/15 1311 03/13/15 2135  Weight: 71.668 kg (158 lb) 75.07 kg (165 lb 8 oz)    Exam:   General:  Pt in nad, alert and awake  Cardiovascular: rrr, no mrg  Respiratory: cta bl, decreased breath sounds at bases, no wheezes  Abdomen: soft, ND, Nt  Musculoskeletal: no cyanosis   Data Reviewed: Basic Metabolic Panel:  Recent Labs Lab 03/13/15 1520  NA 128*  K 3.8  CL 94*  CO2 25  GLUCOSE 463*  BUN 24*  CREATININE 1.29*  CALCIUM 8.7*   Liver Function Tests:  Recent Labs Lab 03/13/15 1520  AST 16  ALT 15  ALKPHOS 88  BILITOT 0.7  PROT 7.2  ALBUMIN 2.9*    Recent Labs Lab 03/13/15 1520  LIPASE 25   No results for input(s): AMMONIA in the  last 168 hours. CBC:  Recent Labs Lab 03/13/15 1520  WBC 14.5*  HGB 11.1*  HCT 32.2*  MCV 87.0  PLT 426*   Cardiac Enzymes:  Recent Labs Lab 03/13/15 2033 03/14/15 0328 03/14/15 0755  TROPONINI <0.03 <0.03 <0.03   BNP (last 3 results)  Recent Labs  03/13/15 2033  BNP 132.0*    ProBNP (last 3 results) No results for input(s): PROBNP in the last 8760 hours.  CBG:  Recent Labs Lab 03/13/15 2149 03/14/15 0743 03/14/15 1201 03/14/15 1637  GLUCAP 396* 233* 240* 281*    Recent Results (from the past 240 hour(s))  Culture, blood (routine x 2)     Status: None (Preliminary result)   Collection Time: 03/13/15  3:53 PM  Result Value Ref Range Status   Specimen Description BLOOD LEFT FOREARM  Final   Special Requests BOTTLES DRAWN AEROBIC AND ANAEROBIC 10ML  Final   Culture NO GROWTH < 24 HOURS  Final   Report Status PENDING  Incomplete  Culture, blood (routine x 2)     Status: None (Preliminary result)   Collection Time: 03/13/15  3:55 PM  Result Value Ref Range Status   Specimen Description BLOOD RIGHT FOREARM  Final   Special Requests BOTTLES DRAWN AEROBIC AND ANAEROBIC 5CC  Final   Culture NO GROWTH < 24 HOURS  Final   Report Status  PENDING  Incomplete     Studies: Dg Chest 2 View  03/13/2015   CLINICAL DATA:  54 year old female with shortness of breath and chest pain on the left for 2 weeks. Initial encounter.  EXAM: CHEST  2 VIEW  COMPARISON:  04/03/2014 and earlier.  FINDINGS: Chronic low lung volumes. Stable mild cardiomegaly. Other mediastinal contours are within normal limits. Visualized tracheal air column is within normal limits. No pneumothorax. No pulmonary edema. Chronic blunting of the costophrenic angles. There is left greater than right streaky increased lower lobe opacity. No consolidation. No acute osseous abnormality identified.  IMPRESSION: Low lung volumes with left greater than right nonspecific streaky lung base opacity. Chronic blunting of  the costophrenic angles. The appearance is nonspecific. Lung base pneumonia cannot be excluded.   Electronically Signed   By: Genevie Ann M.D.   On: 03/13/2015 15:21   Ct Angio Chest Pe W/cm &/or Wo Cm  03/13/2015   CLINICAL DATA:  54 year old female with shortness of breath and chest pain.  EXAM: CT ANGIOGRAPHY CHEST WITH CONTRAST  TECHNIQUE: Multidetector CT imaging of the chest was performed using the standard protocol during bolus administration of intravenous contrast. Multiplanar CT image reconstructions and MIPs were obtained to evaluate the vascular anatomy.  CONTRAST:  179mL OMNIPAQUE IOHEXOL 350 MG/ML SOLN  COMPARISON:  None.  FINDINGS: Mediastinum: Normal heart size. There is a small to moderate pericardial effusion, image 59/ series 4. This measures up to 1.7 cm in thickness. The main pulmonary artery is normal. No left or right pulmonary emboli identified. No lobar or segmental pulmonary artery filling defects identified to suggest a clinically significant acute pulmonary embolus.  Lungs/Pleura: Small to moderate bilateral pleural effusions are identified. Overlying compressive type atelectasis noted.  Upper Abdomen: Hepatic steatosis noted. The visualized portions of the spleen and adrenal glands are unremarkable.  Musculoskeletal: No aggressive lytic or sclerotic bone lesions identified.  Review of the MIP images confirms the above findings.  IMPRESSION: 1. No evidence for acute pulmonary embolus. 2. Pericardial effusion. 3. Bilateral pleural effusions with overlying atelectasis.   Electronically Signed   By: Kerby Moors M.D.   On: 03/13/2015 18:32    Scheduled Meds: . colchicine  0.6 mg Oral Daily  . DULoxetine  60 mg Oral Daily  . ibuprofen  600 mg Oral TID  . Influenza vac split quadrivalent PF  0.5 mL Intramuscular Once  . insulin aspart  0-5 Units Subcutaneous QHS  . insulin aspart  0-9 Units Subcutaneous TID WC  . insulin aspart  10 Units Subcutaneous TID AC  . insulin glargine  40  Units Subcutaneous BID  . lamoTRIgine  100 mg Oral Daily  . levofloxacin (LEVAQUIN) IV  750 mg Intravenous Q48H  . levothyroxine  137 mcg Oral QAC breakfast  . pantoprazole  40 mg Oral Daily  . rosuvastatin  40 mg Oral Daily   Continuous Infusions:   Time spent: > 35 minutes  Velvet Bathe  [facility] Pager [phone]. If 7PM-7AM, please contact night-coverage at [email or web address], password [email or web address] 03/14/2015, 5:41 PM  LOS: 1 day

## 2015-03-14 NOTE — Consult Note (Signed)
CARDIOLOGY CONSULT NOTE  Patient ID: Suzanne Allen, MRN: [id number], DOB/AGE: [birth year] 54 y.o. Admit date: 03/13/2015 Date of Consult: 03/14/2015  Primary Physician: Barrie Lyme, FNP Primary Cardiologist: Burt Knack Referring Physician: Dr Cruzita Lederer  Chief Complaint: Shortness of breath Reason for Consultation: Pericardial effusion  HPI:  54 year old woman admitted yesterday evening with chest pain and shortness of breath. The patient has a history of poorly controlled diabetes, hypertension, and depression. She describes a 4 day history of arthralgias, shortness of breath, and pleuritic chest discomfort. Chest pain has been substernal and left-sided. It is worse with lying flat and with deep breathing. She denies orthopnea or PND. She has had no fevers or chills. Her joint pains are beginning to get better. She denies palpitations, lightheadedness, or syncope. When evaluated in the emergency department, she was noted to have leukocytosis and chest x-ray findings that raised question of pneumonia. A CT angiogram showed no evidence of pulmonary embolus but it did show moderate bilateral pleural effusions and a moderate pericardial effusion. An echocardiogram was done today and confirmed a moderate pericardial effusion. Cardiology consultation was placed to evaluate this patient with a paracardial effusion.  Medical History:  Past Medical History  Diagnosis Date  . HTN (hypertension)   . Macular degeneration, bilateral   . Hypercholesteremia   . Hypothyroidism   . Stroke ? date & 09/22/2012  . Peripheral neuropathy   . Anxiety   . Depression   . PTSD (post-traumatic stress disorder)   . 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 Fatty liver 04/04/2014  . Carpal tunnel syndrome 11/28/2014    Bilateral  . CAP (community acquired pneumonia) 03/13/2015  . Pneumonia 1993  . Diabetes mellitus type I dx'd 1977  . Diabetic osteomyelitis     right great toe/progress notes 09/23/2012    . Migraines 1980's    "when I was in my 20's" (03/14/2015)  . Chronic pancreatitis   . Diabetic retinopathy   . TIA (transient ischemic attack) 09/22/2012    [name] 10/25/2012      Surgical History:  Past Surgical History  Procedure Laterality Date  . Cesarean section  1989; 1995  . Tonsillectomy  1965  . Refractive surgery Bilateral 2013  . Total thyroidectomy  2012  . Peripherally inserted central catheter insertion Right 08/31/2012  . I&d extremity Right multiple    "big toe"  . Toe amputation Right 11/2012    "big toe"  . Tubal ligation  1995  . Eye surgery Bilateral     "hemorrhaged behind my eye"  . Picc line removal ([facility] hx) Right 10/12/2012    [name] 10/12/2012     Home Meds: Prior to Admission medications   Medication Sig Start Date End Date Taking? Authorizing Provider  amLODipine (NORVASC) 5 MG tablet Take 5 mg by mouth daily.  07/31/14 07/31/15 Yes Historical Provider, MD  CRESTOR 40 MG tablet Take 1 tablet by mouth daily 11/06/14  Yes Historical Provider, MD  DULoxetine (CYMBALTA) 60 MG capsule Take 1 capsule (60 mg total) by mouth daily. 02/21/15  Yes [name], MD  FOLIC ACID PO Take 1 tablet by mouth daily.   Yes Historical Provider, MD  insulin aspart (NOVOLOG) 100 UNIT/ML injection Inject 20 Units into the skin 3 (three) times daily before meals.    Yes Historical Provider, MD  insulin glargine (LANTUS) 100 UNIT/ML injection Inject 40 Units into the skin 2 (two) times daily.   Yes Historical Provider, MD  lamoTRIgine (LAMICTAL) 100  MG tablet Take 1 tablet (100 mg total) by mouth daily. 02/15/15  Yes Britt Bottom, MD  levothyroxine (SYNTHROID, LEVOTHROID) 137 MCG tablet Take 137 mcg by mouth daily.     Yes Historical Provider, MD  lisinopril-hydrochlorothiazide (PRINZIDE,ZESTORETIC) 20-25 MG per tablet Take 1 tablet by mouth daily.   Yes Historical Provider, MD  milk thistle 175 MG tablet Take 175 mg by mouth daily.   Yes Historical Provider, MD  ondansetron  (ZOFRAN) 4 MG tablet Take 1 tablet (4 mg total) by mouth every 6 (six) hours as needed for nausea. 04/10/14  Yes Venetia Maxon Rama, MD  clonazePAM (KLONOPIN) 0.5 MG tablet Take 1/2 tab in am and 1/2 at bed time Patient not taking: Reported on 03/13/2015 02/21/15   [name], MD  lamoTRIgine (LAMICTAL) 25 MG tablet 1 po qd x 7d, then 1 po bid x 7d, then 1 po tid x 7d, then 2 po bid x 7 days 02/15/15   Britt Bottom, MD    Inpatient Medications:  . DULoxetine  60 mg Oral Daily  . Influenza vac split quadrivalent PF  0.5 mL Intramuscular Once  . insulin aspart  0-5 Units Subcutaneous QHS  . insulin aspart  0-9 Units Subcutaneous TID WC  . insulin aspart  10 Units Subcutaneous TID AC  . insulin glargine  40 Units Subcutaneous BID  . lamoTRIgine  100 mg Oral Daily  . levofloxacin (LEVAQUIN) IV  750 mg Intravenous Q48H  . levothyroxine  137 mcg Oral QAC breakfast  . rosuvastatin  40 mg Oral Daily      Allergies:  Allergies  Allergen Reactions  . Erythromycin Anaphylaxis  . Vancomycin Other (See Comments)    Red Man Syndrome    Social History   Social History  . Marital Status: Widowed    Spouse Name: N/A  . Number of Children: N/A  . Years of Education: N/A   Occupational History  . Not on file.   Social History Main Topics  . Smoking status: Never Smoker   . Smokeless tobacco: Never Used  . Alcohol Use: No  . Drug Use: No  . Sexual Activity: Not Currently   Other Topics Concern  . Not on file   Social History Narrative     Family History  Problem Relation Age of Onset  . Depression Mother   . Alcohol abuse Father   . Alcohol abuse Sister      Review of Systems General: negative for chills, fever, night sweats or weight changes.  ENT: negative for rhinorrhea or epistaxis Cardiovascular: See history of present illness Dermatological: negative for rash Respiratory: negative for cough or wheezing. Positive for shortness of breath GI: negative for nausea,  vomiting, diarrhea, bright red blood per rectum, melena, or hematemesis GU: no hematuria, urgency, or frequency Neurologic: Positive for headache Heme: no easy bruising or bleeding Endo: negative for excessive thirst, thyroid disorder, or flushing Musculoskeletal: Positive for joint pains All other systems reviewed and are otherwise negative except as noted above.  Physical Exam: Blood pressure 109/50, pulse 88, temperature 99 F (37.2 C), temperature source Oral, resp. rate 16, height 5\' 2"  (1.575 m), weight 165 lb 8 oz (75.07 kg), SpO2 95 %. There is no pulsus paradoxus present Pt is alert and oriented, WD, WN, in no distress. HEENT: normal Neck: JVP normal. Carotid upstrokes normal without bruits. No thyromegaly. Lungs: Diminished in the bases, otherwise clear CV: Apex is discrete and nondisplaced, RRR a friction rub present Abd:  soft, NT, +BS, no bruit, no hepatosplenomegaly Back: no CVA tenderness Ext: no C/C/E        DP/PT pulses intact and = Skin: warm and dry without rash Neuro: CNII-XII intact             Strength intact = bilaterally   Labs:  Recent Labs  03/13/15 2033 03/14/15 0328 03/14/15 0755  TROPONINI <0.03 <0.03 <0.03   Lab Results  Component Value Date   WBC 14.5* 03/13/2015   HGB 11.1* 03/13/2015   HCT 32.2* 03/13/2015   MCV 87.0 03/13/2015   PLT 426* 03/13/2015    Recent Labs Lab 03/13/15 1520  NA 128*  K 3.8  CL 94*  CO2 25  BUN 24*  CREATININE 1.29*  CALCIUM 8.7*  PROT 7.2  BILITOT 0.7  ALKPHOS 88  ALT 15  AST 16  GLUCOSE 463*   Lab Results  Component Value Date   CHOL 168 04/02/2014   HDL 43 04/02/2014   LDLCALC 101* 04/02/2014   TRIG 119 04/02/2014   Lab Results  Component Value Date   DDIMER  11/15/2008    0.23        AT THE INHOUSE ESTABLISHED CUTOFF VALUE OF 0.48 ug/mL FEU, THIS ASSAY HAS BEEN DOCUMENTED IN THE LITERATURE TO HAVE A SENSITIVITY AND NEGATIVE PREDICTIVE VALUE OF AT LEAST 98 TO 99%.  THE TEST  RESULT SHOULD BE CORRELATED WITH AN ASSESSMENT OF THE CLINICAL PROBABILITY OF DVT / VTE.    Radiology/Studies:  Dg Chest 2 View  03/13/2015   CLINICAL DATA:  54 year old female with shortness of breath and chest pain on the left for 2 weeks. Initial encounter.  EXAM: CHEST  2 VIEW  COMPARISON:  04/03/2014 and earlier.  FINDINGS: Chronic low lung volumes. Stable mild cardiomegaly. Other mediastinal contours are within normal limits. Visualized tracheal air column is within normal limits. No pneumothorax. No pulmonary edema. Chronic blunting of the costophrenic angles. There is left greater than right streaky increased lower lobe opacity. No consolidation. No acute osseous abnormality identified.  IMPRESSION: Low lung volumes with left greater than right nonspecific streaky lung base opacity. Chronic blunting of the costophrenic angles. The appearance is nonspecific. Lung base pneumonia cannot be excluded.   Electronically Signed   By: Genevie Ann M.D.   On: 03/13/2015 15:21   Ct Angio Chest Pe W/cm &/or Wo Cm  03/13/2015   CLINICAL DATA:  54 year old female with shortness of breath and chest pain.  EXAM: CT ANGIOGRAPHY CHEST WITH CONTRAST  TECHNIQUE: Multidetector CT imaging of the chest was performed using the standard protocol during bolus administration of intravenous contrast. Multiplanar CT image reconstructions and MIPs were obtained to evaluate the vascular anatomy.  CONTRAST:  167mL OMNIPAQUE IOHEXOL 350 MG/ML SOLN  COMPARISON:  None.  FINDINGS: Mediastinum: Normal heart size. There is a small to moderate pericardial effusion, image 59/ series 4. This measures up to 1.7 cm in thickness. The main pulmonary artery is normal. No left or right pulmonary emboli identified. No lobar or segmental pulmonary artery filling defects identified to suggest a clinically significant acute pulmonary embolus.  Lungs/Pleura: Small to moderate bilateral pleural effusions are identified. Overlying compressive type  atelectasis noted.  Upper Abdomen: Hepatic steatosis noted. The visualized portions of the spleen and adrenal glands are unremarkable.  Musculoskeletal: No aggressive lytic or sclerotic bone lesions identified.  Review of the MIP images confirms the above findings.  IMPRESSION: 1. No evidence for acute pulmonary embolus. 2. Pericardial effusion. 3. Bilateral pleural  effusions with overlying atelectasis.   Electronically Signed   By: Kerby Moors M.D.   On: 03/13/2015 18:32   Mr Jeri Cos [id number] Contrast  03/07/2015     [facility] [address] [place], [place] [address] [phone]  NEUROIMAGING REPORT   STUDY DATE: 03/07/2015 PATIENT NAME: Suzanne Allen DOB: 11-12-1960 MRN: [id number]  EXAM: MRI Brain with and without contrast  ORDERING CLINICIAN: Richard A. Sater, MD. PhD CLINICAL HISTORY: 54 year old woman gait disturbance and numbness COMPARISON FILMS: None  TECHNIQUE:MRI of the brain with and without contrast was obtained  utilizing 5 mm axial slices with T1, T2, T2 flair, SWI and diffusion  weighted views.  T1 sagittal, T2 coronal and postcontrast views in the  axial and coronal plane were obtained. CONTRAST: 14 ml [employer] IMAGING SITE: [facility], [place].  FINDINGS: On sagittal images, the spinal cord is imaged caudally to C3 and  is normal in caliber.   The contents of the posterior fossa are of normal  size and position.   The pituitary gland and optic chiasm appear normal.     Brain volume appears normal.   The ventricles are normal in size and  without distortion.  There are no abnormal extra-axial collections of  fluid.    The cerebellum and brainstem appears normal.   The deep gray matter  appears normal.  The cerebral hemispheres appear normal.  The orbits  appear normal.   The VIIth/VIIIth nerve complex appears normal.  The  mastoid air cells appear normal.  There is a retention cyst at the floor  of the right maxillary sinus. The other paranasal  sinuses appear normal.   Flow voids are identified within the major intracerebral arteries.     Diffusion weighted images are normal.  Susceptibility weighted images are  normal.   After the infusion of contrast material, a normal enhancement  pattern is noted.       03/07/2015    This is a normal MRI of the brain with and without contrast.   There are no acute findings.    INTERPRETING PHYSICIAN:  Richard A. Felecia Shelling, MD, PhD Certified in  Neuroimaging by [employer]      EKG: Sinus rhythm, low voltage, minimal diffuse ST elevation  Cardiac Studies: 2D Echo: Study Conclusions  - Left ventricle: The cavity size was normal. There was mild concentric hypertrophy. Systolic function was normal. The estimated ejection fraction was in the range of 60% to 65%. Wall motion was normal; there were no regional wall motion abnormalities. Doppler parameters are consistent with abnormal left ventricular relaxation (grade 1 diastolic dysfunction). - Mitral valve: Calcified annulus. There was mild regurgitation. - Pericardium, extracardiac: A moderate pericardial effusion was identified circumferential to the heart. The fluid exhibited hematoma appearance around apex. There was mildright ventricular chamber collapse. There was mildright atrial chamber collapse. Features were consistent with mild tamponade physiology. There was a moderate-sized left pleural effusion.  Impressions:  - When compared to prior study, pericardial effusion is new.  CT Angio of chest: FINDINGS: Mediastinum: Normal heart size. There is a small to moderate pericardial effusion, image 59/ series 4. This measures up to 1.7 cm in thickness. The main pulmonary artery is normal. No left or right pulmonary emboli identified. No lobar or segmental pulmonary artery filling defects identified to suggest a clinically significant acute pulmonary embolus.  Lungs/Pleura: Small to moderate  bilateral pleural effusions are identified. Overlying compressive type atelectasis noted.  Upper  Abdomen: Hepatic steatosis noted. The visualized portions of the spleen and adrenal glands are unremarkable.  Musculoskeletal: No aggressive lytic or sclerotic bone lesions identified.  Review of the MIP images confirms the above findings.  IMPRESSION: 1. No evidence for acute pulmonary embolus. 2. Pericardial effusion. 3. Bilateral pleural effusions with overlying atelectasis.  ASSESSMENT AND PLAN:  This is a 54 year old woman with a moderate pericardial effusion. She has had a systemic illness over the last few days and her constellation of findings with pleuritic chest pain, pericardial effusion, friction rub on exam, and subtle EKG changes all suggest acute pleuro-pericarditis. I suspect this is related to a viral illness. While she has some echocardiographic findings suggestive of early tamponade physiology with variation in mitral inflow and mild RV and RA collapse, she does not clinically appear to be in tamponade. There is no paradoxical pulse on exam and the patient does not exhibit signs of tachycardia or systemic hypotension. I have recommended a treatment course of non-steroidal anti-inflammatory medication and culture seen. I would repeat a limited echocardiogram in about 48 hours to make sure that her effusion is not progressing. I have recommended that we place her on telemetry. Hopefully she will respond to conservative measures without clinical deterioration. If she shows clinical signs of cardiac tamponade, pericardial centesis may be required. I reviewed all treatment possibilities with the patient and her family today.  SignedSherren Mocha MD, [facility] 03/14/2015, 1:14 PM

## 2015-03-14 NOTE — Progress Notes (Signed)
  Echocardiogram 2D Echocardiogram has been performed.  Suzanne Allen 03/14/2015, 11:27 AM

## 2015-03-15 DIAGNOSIS — R0600 Dyspnea, unspecified: Secondary | ICD-10-CM

## 2015-03-15 LAB — GLUCOSE, CAPILLARY
Glucose-Capillary: 105 mg/dL — ABNORMAL HIGH (ref 65–99)
Glucose-Capillary: 124 mg/dL — ABNORMAL HIGH (ref 65–99)
Glucose-Capillary: 121 mg/dL — ABNORMAL HIGH (ref 65–99)
Glucose-Capillary: 222 mg/dL — ABNORMAL HIGH (ref 65–99)

## 2015-03-15 LAB — HEMOGLOBIN A1C
Hgb A1c MFr Bld: 16 % — ABNORMAL HIGH (ref 4.8–5.6)
Mean Plasma Glucose: 413 mg/dL

## 2015-03-15 LAB — LEGIONELLA ANTIGEN, URINE

## 2015-03-15 MED ORDER — CLONAZEPAM 0.5 MG PO TABS
0.5000 mg | ORAL_TABLET | Freq: Two times a day (BID) | ORAL | Status: DC | PRN
  Administered 2015-03-15 – 2015-03-17 (×4): 0.5 mg via ORAL
  Filled 2015-03-15 (×4): qty 1

## 2015-03-15 NOTE — Progress Notes (Signed)
[facility] PROGRESS NOTE  Suzanne Allen [id number] DOB: 1961/05/03 DOA: 03/13/2015 PCP: Barrie Lyme, FNP  Assessment/Plan: Active Problems:   Chest pain - Most likely due to pericardial effusion. Presumed to be secondary to viral infection. - Echo to be repeated tomorrow. - Troponins negative 3  Diabetes type 1, uncontrolled -Continue Lantus, sliding scale insulin - Diabetic diet    Leukocytosis - On Levaquin - CT results is not convincing for pneumonia - May be reactive     Hypothyroidism - continue synthroid    Dyslipidemia - continue statin    Dyspnea - most likely due to effusions   Respiratory failure with hypoxia   Pericardial effusion   Code Status: full3 Family Communication: no faimly at bedside  Disposition Plan: pending improvement in condition, repeat echocardiogram in 48 hours   Consultants:  Cardiology  Procedures:  echocardiogram  Antibiotics:  Levaquin  HPI/Subjective: Pt has no new complaints, no acute issues reported overnight.  Objective: Filed Vitals:   03/15/15 0604  BP: 106/64  Pulse: 80  Temp: 98.3 F (36.8 C)  Resp: 15    Intake/Output Summary (Last 24 hours) at 03/15/15 1423 Last data filed at 03/14/15 1850  Gross per 24 hour  Intake    460 ml  Output      0 ml  Net    460 ml   Filed Weights   03/13/15 1311 03/13/15 2135  Weight: 71.668 kg (158 lb) 75.07 kg (165 lb 8 oz)    Exam:   General:  Pt in nad, alert and awake  Cardiovascular: rrr, no mrg  Respiratory: cta bl, decreased breath sounds at bases, no wheezes  Abdomen: soft, ND, Nt  Musculoskeletal: no cyanosis   Data Reviewed: Basic Metabolic Panel:  Recent Labs Lab 03/13/15 1520  NA 128*  K 3.8  CL 94*  CO2 25  GLUCOSE 463*  BUN 24*  CREATININE 1.29*  CALCIUM 8.7*   Liver Function Tests:  Recent Labs Lab 03/13/15 1520  AST 16  ALT 15  ALKPHOS 88  BILITOT 0.7  PROT 7.2  ALBUMIN 2.9*    Recent Labs Lab  03/13/15 1520  LIPASE 25   No results for input(s): AMMONIA in the last 168 hours. CBC:  Recent Labs Lab 03/13/15 1520  WBC 14.5*  HGB 11.1*  HCT 32.2*  MCV 87.0  PLT 426*   Cardiac Enzymes:  Recent Labs Lab 03/13/15 2033 03/14/15 0328 03/14/15 0755  TROPONINI <0.03 <0.03 <0.03   BNP (last 3 results)  Recent Labs  03/13/15 2033  BNP 132.0*    ProBNP (last 3 results) No results for input(s): PROBNP in the last 8760 hours.  CBG:  Recent Labs Lab 03/14/15 1201 03/14/15 1637 03/14/15 2129 03/15/15 0728 03/15/15 1228  GLUCAP 240* 281* 196* 105* 124*    Recent Results (from the past 240 hour(s))  Culture, blood (routine x 2)     Status: None (Preliminary result)   Collection Time: 03/13/15  3:53 PM  Result Value Ref Range Status   Specimen Description BLOOD LEFT FOREARM  Final   Special Requests BOTTLES DRAWN AEROBIC AND ANAEROBIC 10ML  Final   Culture NO GROWTH < 24 HOURS  Final   Report Status PENDING  Incomplete  Culture, blood (routine x 2)     Status: None (Preliminary result)   Collection Time: 03/13/15  3:55 PM  Result Value Ref Range Status   Specimen Description BLOOD RIGHT FOREARM  Final   Special Requests BOTTLES DRAWN AEROBIC AND  ANAEROBIC 5CC  Final   Culture NO GROWTH < 24 HOURS  Final   Report Status PENDING  Incomplete     Studies: Dg Chest 2 View  03/13/2015   CLINICAL DATA:  54 year old female with shortness of breath and chest pain on the left for 2 weeks. Initial encounter.  EXAM: CHEST  2 VIEW  COMPARISON:  04/03/2014 and earlier.  FINDINGS: Chronic low lung volumes. Stable mild cardiomegaly. Other mediastinal contours are within normal limits. Visualized tracheal air column is within normal limits. No pneumothorax. No pulmonary edema. Chronic blunting of the costophrenic angles. There is left greater than right streaky increased lower lobe opacity. No consolidation. No acute osseous abnormality identified.  IMPRESSION: Low lung  volumes with left greater than right nonspecific streaky lung base opacity. Chronic blunting of the costophrenic angles. The appearance is nonspecific. Lung base pneumonia cannot be excluded.   Electronically Signed   By: Genevie Ann M.D.   On: 03/13/2015 15:21   Ct Angio Chest Pe W/cm &/or Wo Cm  03/13/2015   CLINICAL DATA:  54 year old female with shortness of breath and chest pain.  EXAM: CT ANGIOGRAPHY CHEST WITH CONTRAST  TECHNIQUE: Multidetector CT imaging of the chest was performed using the standard protocol during bolus administration of intravenous contrast. Multiplanar CT image reconstructions and MIPs were obtained to evaluate the vascular anatomy.  CONTRAST:  120mL OMNIPAQUE IOHEXOL 350 MG/ML SOLN  COMPARISON:  None.  FINDINGS: Mediastinum: Normal heart size. There is a small to moderate pericardial effusion, image 59/ series 4. This measures up to 1.7 cm in thickness. The main pulmonary artery is normal. No left or right pulmonary emboli identified. No lobar or segmental pulmonary artery filling defects identified to suggest a clinically significant acute pulmonary embolus.  Lungs/Pleura: Small to moderate bilateral pleural effusions are identified. Overlying compressive type atelectasis noted.  Upper Abdomen: Hepatic steatosis noted. The visualized portions of the spleen and adrenal glands are unremarkable.  Musculoskeletal: No aggressive lytic or sclerotic bone lesions identified.  Review of the MIP images confirms the above findings.  IMPRESSION: 1. No evidence for acute pulmonary embolus. 2. Pericardial effusion. 3. Bilateral pleural effusions with overlying atelectasis.   Electronically Signed   By: Kerby Moors M.D.   On: 03/13/2015 18:32    Scheduled Meds: . colchicine  0.6 mg Oral Daily  . DULoxetine  60 mg Oral Daily  . ibuprofen  600 mg Oral TID  . insulin aspart  0-5 Units Subcutaneous QHS  . insulin aspart  0-9 Units Subcutaneous TID WC  . insulin aspart  10 Units Subcutaneous TID  AC  . insulin glargine  40 Units Subcutaneous BID  . lamoTRIgine  100 mg Oral Daily  . levofloxacin (LEVAQUIN) IV  750 mg Intravenous Q48H  . levothyroxine  137 mcg Oral QAC breakfast  . pantoprazole  40 mg Oral Daily  . rosuvastatin  40 mg Oral Daily   Continuous Infusions:   Time spent: > 35 minutes  Velvet Bathe  [facility] Pager [phone]. If 7PM-7AM, please contact night-coverage at [email or web address], password [facility] 03/15/2015, 2:23 PM  LOS: 2 days

## 2015-03-15 NOTE — Progress Notes (Signed)
Subjective:  SOB improved, she id comfortable flat in bed.  Objective:  Vital Signs in the last 24 hours: Temp:  [97.9 F (36.6 C)-98.3 F (36.8 C)] 98.3 F (36.8 C) (09/02 0604) Pulse Rate:  [80-96] 80 (09/02 0604) Resp:  [15-16] 15 (09/02 0604) BP: (103-111)/(60-65) 106/64 mmHg (09/02 0604) SpO2:  [95 %-96 %] 95 % (09/02 0604)  Intake/Output from previous day:  Intake/Output Summary (Last 24 hours) at 03/15/15 1117 Last data filed at 03/14/15 1850  Gross per 24 hour  Intake    580 ml  Output      0 ml  Net    580 ml    Physical Exam: General appearance: alert, cooperative, no distress and moderately obese Neck: no JVD Lungs: decreased breath sounds at bases Heart: regular rate and rhythm and no rub Extremities: extremities normal, atraumatic, no cyanosis or edema Skin: Skin color, texture, turgor normal. No rashes or lesions Neurologic: Grossly normal   Rate: 80  Rhythm: normal sinus rhythm  Lab Results:  Recent Labs  03/13/15 1520  WBC 14.5*  HGB 11.1*  PLT 426*    Recent Labs  03/13/15 1520  NA 128*  K 3.8  CL 94*  CO2 25  GLUCOSE 463*  BUN 24*  CREATININE 1.29*    Recent Labs  03/14/15 0328 03/14/15 0755  TROPONINI <0.03 <0.03   No results for input(s): INR in the last 72 hours.  Scheduled Meds: . colchicine  0.6 mg Oral Daily  . DULoxetine  60 mg Oral Daily  . ibuprofen  600 mg Oral TID  . Influenza vac split quadrivalent PF  0.5 mL Intramuscular Once  . insulin aspart  0-5 Units Subcutaneous QHS  . insulin aspart  0-9 Units Subcutaneous TID WC  . insulin aspart  10 Units Subcutaneous TID AC  . insulin glargine  40 Units Subcutaneous BID  . lamoTRIgine  100 mg Oral Daily  . levofloxacin (LEVAQUIN) IV  750 mg Intravenous Q48H  . levothyroxine  137 mcg Oral QAC breakfast  . pantoprazole  40 mg Oral Daily  . rosuvastatin  40 mg Oral Daily   Continuous Infusions:  PRN Meds:.ondansetron   Imaging: Dg Chest 2  View  03/13/2015   CLINICAL DATA:  54 year old female with shortness of breath and chest pain on the left for 2 weeks. Initial encounter.  EXAM: CHEST  2 VIEW  COMPARISON:  04/03/2014 and earlier.  FINDINGS: Chronic low lung volumes. Stable mild cardiomegaly. Other mediastinal contours are within normal limits. Visualized tracheal air column is within normal limits. No pneumothorax. No pulmonary edema. Chronic blunting of the costophrenic angles. There is left greater than right streaky increased lower lobe opacity. No consolidation. No acute osseous abnormality identified.  IMPRESSION: Low lung volumes with left greater than right nonspecific streaky lung base opacity. Chronic blunting of the costophrenic angles. The appearance is nonspecific. Lung base pneumonia cannot be excluded.   Electronically Signed   By: Genevie Ann M.D.   On: 03/13/2015 15:21   Ct Angio Chest Pe W/cm &/or Wo Cm  03/13/2015   CLINICAL DATA:  54 year old female with shortness of breath and chest pain.  EXAM: CT ANGIOGRAPHY CHEST WITH CONTRAST  TECHNIQUE: Multidetector CT imaging of the chest was performed using the standard protocol during bolus administration of intravenous contrast. Multiplanar CT image reconstructions and MIPs were obtained to evaluate the vascular anatomy.  CONTRAST:  143mL OMNIPAQUE IOHEXOL 350 MG/ML SOLN  COMPARISON:  None.  FINDINGS: Mediastinum: Normal  heart size. There is a small to moderate pericardial effusion, image 59/ series 4. This measures up to 1.7 cm in thickness. The main pulmonary artery is normal. No left or right pulmonary emboli identified. No lobar or segmental pulmonary artery filling defects identified to suggest a clinically significant acute pulmonary embolus.  Lungs/Pleura: Small to moderate bilateral pleural effusions are identified. Overlying compressive type atelectasis noted.  Upper Abdomen: Hepatic steatosis noted. The visualized portions of the spleen and adrenal glands are unremarkable.   Musculoskeletal: No aggressive lytic or sclerotic bone lesions identified.  Review of the MIP images confirms the above findings.  IMPRESSION: 1. No evidence for acute pulmonary embolus. 2. Pericardial effusion. 3. Bilateral pleural effusions with overlying atelectasis.   Electronically Signed   By: Kerby Moors M.D.   On: 03/13/2015 18:32    Cardiac Studies: Echo 03/14/15 Study Conclusions  - Left ventricle: The cavity size was normal. There was mild concentric hypertrophy. Systolic function was normal. The estimated ejection fraction was in the range of 60% to 65%. Wall motion was normal; there were no regional wall motion abnormalities. Doppler parameters are consistent with abnormal left ventricular relaxation (grade 1 diastolic dysfunction). - Mitral valve: Calcified annulus. There was mild regurgitation. - Pericardium, extracardiac: A moderate pericardial effusion was identified circumferential to the heart. The fluid exhibited hematoma appearance around apex. There was mildright ventricular chamber collapse. There was mildright atrial chamber collapse. Features were consistent with mild tamponade physiology. There was a moderate-sized left pleural effusion.  Impressions:  - When compared to prior study, pericardial effusion is new.  Assessment/Plan:  54 year old woman admitted 03/13/15 with chest pain and shortness of breath. The patient has a history of poorly controlled diabetes, hypertension, and depression. She had pericardial effusion and pleural effusion.  She is being Rx'd with NSAID and antibiotics.   Principal Problem:   Pericardial effusion Active Problems:   CAP (community acquired pneumonia)   Diabetes type 1, uncontrolled   Dyspnea   Benign essential HTN   Hypothyroidism   Dyslipidemia   PLAN: Watch SCr on NSAID- slight bump noted today. Echo ordered for Sat.  Kerin Ransom PA-C 03/15/2015, 11:17 AM [phone]    Patient seen and  examined. Agree with assessment and plan. No pleuritic chest pain. No audible rub.  Feels better; f/u renal profile.   Troy Sine, MD, [facility] 03/15/2015 3:55 PM

## 2015-03-15 NOTE — Progress Notes (Signed)
Inpatient Diabetes Program Recommendations  AACE/ADA: New Consensus Statement on Inpatient Glycemic Control (2013)  Target Ranges:  Prepandial:   less than 140 mg/dL      Peak postprandial:   less than 180 mg/dL (1-2 hours)      Critically ill patients:  140 - 180 mg/dL    Results for SIMSYamilett, Janowiak (MRN [id number]) as of 03/15/2015 13:07  Ref. Range 03/14/2015 07:43 03/14/2015 12:01 03/14/2015 16:37 03/14/2015 21:29  Glucose-Capillary Latest Ref Range: 65-99 mg/dL 233 (H) 240 (H) 281 (H) 196 (H)    Results for SHAUNTRICE, GRABE (MRN [id number]) as of 03/15/2015 13:07  Ref. Range 03/15/2015 07:28 03/15/2015 12:28  Glucose-Capillary Latest Ref Range: 65-99 mg/dL 105 (H) 124 (H)    Results for SIMSGurpreet, Granados (MRN [id number]) as of 03/15/2015 13:07  Ref. Range 04/04/2014 05:00 03/14/2015 03:28  Hemoglobin A1C Latest Ref Range: 4.8-5.6 % 10.3 (H) 16.0 (H)    Home DM Meds: Lantus 40 units bid       Humalog 0-20 units tid  Current DM Orders: Lantus 40 units bid            Novolog Sensitive SSI (0-9 units) TID AC + HS            Novolog 10 units tidwc     -Glucose levels much improved today after initiation of Lantus and Novolog.  -Current A1c 16%- extremely poor glucose control at home.  -Spoke to patient about her current A1c of 16%.  Explained what an A1c is and what it measures.  Reminded patient that her goal A1c is 7% or less per ADA standards to prevent both acute and long-term complications.  Explained to patient the extreme importance of good glucose control at home.  Patient told me her glucose levels are always elevated when she gets sick.  Patient stated she sees Dr. Steffanie Dunn with [facility] for DM management.  I asked patient if she had any questions about her insulin regimen at home.  Patient told me she has had diabetes since she was 54 years old and knows what to do.  Patient stated she never takes more than 20 units of Humalog with meals.     Will follow Wyn Quaker RN, MSN,  CDE Diabetes Coordinator Inpatient Glycemic Control Team Team Pager: [phone] (8a-5p)

## 2015-03-16 ENCOUNTER — Inpatient Hospital Stay (HOSPITAL_COMMUNITY): Payer: Medicaid Other

## 2015-03-16 DIAGNOSIS — I313 Pericardial effusion (noninflammatory): Secondary | ICD-10-CM

## 2015-03-16 LAB — BASIC METABOLIC PANEL
Anion gap: 11 (ref 5–15)
BUN: 28 mg/dL — ABNORMAL HIGH (ref 6–20)
CO2: 25 mmol/L (ref 22–32)
Calcium: 8.3 mg/dL — ABNORMAL LOW (ref 8.9–10.3)
Chloride: 102 mmol/L (ref 101–111)
Creatinine, Ser: 1.31 mg/dL — ABNORMAL HIGH (ref 0.44–1.00)
GFR calc Af Amer: 53 mL/min — ABNORMAL LOW (ref >60)
GFR calc non Af Amer: 46 mL/min — ABNORMAL LOW (ref >60)
Glucose, Bld: 112 mg/dL — ABNORMAL HIGH (ref 65–99)
Potassium: 3.5 mmol/L (ref 3.5–5.1)
Sodium: 138 mmol/L (ref 135–145)

## 2015-03-16 LAB — GLUCOSE, CAPILLARY
Glucose-Capillary: 84 mg/dL (ref 65–99)
Glucose-Capillary: 121 mg/dL — ABNORMAL HIGH (ref 65–99)
Glucose-Capillary: 158 mg/dL — ABNORMAL HIGH (ref 65–99)
Glucose-Capillary: 128 mg/dL — ABNORMAL HIGH (ref 65–99)

## 2015-03-16 LAB — CBC
HCT: 32.7 % — ABNORMAL LOW (ref 36.0–46.0)
Hemoglobin: 11 g/dL — ABNORMAL LOW (ref 12.0–15.0)
MCH: 30.2 pg (ref 26.0–34.0)
MCHC: 33.6 g/dL (ref 30.0–36.0)
MCV: 89.8 fL (ref 78.0–100.0)
Platelets: 413 10*3/uL — ABNORMAL HIGH (ref 150–400)
RBC: 3.64 MIL/uL — ABNORMAL LOW (ref 3.87–5.11)
RDW: 11.9 % (ref 11.5–15.5)
WBC: 10.3 10*3/uL (ref 4.0–10.5)

## 2015-03-16 NOTE — Progress Notes (Signed)
Subjective:  Admitted with sob and found to have plerual and  pericardial effusion   Initial echo suggested some respiratroy variation in inflow suggestive of early tamponade physiology, but no systemic evidence with HR or BP Repeat echo today shows no change in fluid size but also NO evidence of tamponade physiology She remains on NSAIDS and colchicine  No chest pain But some DOE   Objective:  Vital Signs in the last 24 hours: Temp:  [98.1 F (36.7 C)-98.3 F (36.8 C)] 98.1 F (36.7 C) (09/03 0601) Pulse Rate:  [79-81] 81 (09/03 0601) Resp:  [16] 16 (09/03 0601) BP: (117)/(54-70) 117/54 mmHg (09/03 0601) SpO2:  [94 %-95 %] 95 % (09/03 0601)  Intake/Output from previous day:  Intake/Output Summary (Last 24 hours) at 03/16/15 1332 Last data filed at 03/16/15 0604  Gross per 24 hour  Intake      0 ml  Output      6 ml  Net     -6 ml    Physical Exam: General appearance: alert, cooperative, no distress and moderately obese Neck: no JVD Lungs: decreased breath sounds at bases Heart: regular rate and rhythm and no rub Extremities: extremities normal, atraumatic, no cyanosis or edema Skin: Skin color, texture, turgor normal. No rashes or lesions Neurologic: Grossly normal   Rate: 80  Rhythm: normal sinus rhythm  Lab Results:  Recent Labs  03/13/15 1520 03/16/15 0314  WBC 14.5* 10.3  HGB 11.1* 11.0*  PLT 426* 413*    Recent Labs  03/13/15 1520 03/16/15 0314  NA 128* 138  K 3.8 3.5  CL 94* 102  CO2 25 25  GLUCOSE 463* 112*  BUN 24* 28*  CREATININE 1.29* 1.31*    Recent Labs  03/14/15 0328 03/14/15 0755  TROPONINI <0.03 <0.03   No results for input(s): INR in the last 72 hours.  Scheduled Meds: . colchicine  0.6 mg Oral Daily  . DULoxetine  60 mg Oral Daily  . ibuprofen  600 mg Oral TID  . insulin aspart  0-5 Units Subcutaneous QHS  . insulin aspart  0-9 Units Subcutaneous TID WC  . insulin aspart  10 Units Subcutaneous TID AC  . insulin  glargine  40 Units Subcutaneous BID  . lamoTRIgine  100 mg Oral Daily  . levofloxacin (LEVAQUIN) IV  750 mg Intravenous Q48H  . levothyroxine  137 mcg Oral QAC breakfast  . pantoprazole  40 mg Oral Daily  . rosuvastatin  40 mg Oral Daily   Continuous Infusions:  PRN Meds:.clonazePAM, ondansetron   Imaging: No results found.  Cardiac Studies: Echo 03/14/15 Study Conclusions  - Left ventricle: The cavity size was normal. There was mild concentric hypertrophy. Systolic function was normal. The estimated ejection fraction was in the range of 60% to 65%. Wall motion was normal; there were no regional wall motion abnormalities. Doppler parameters are consistent with abnormal left ventricular relaxation (grade 1 diastolic dysfunction). - Mitral valve: Calcified annulus. There was mild regurgitation. - Pericardium, extracardiac: A moderate pericardial effusion was identified circumferential to the heart. The fluid exhibited hematoma appearance around apex. There was mildright ventricular chamber collapse. There was mildright atrial chamber collapse. Features were consistent with mild tamponade physiology. There was a moderate-sized left pleural effusion.  Impressions:  - When compared to prior study, pericardial effusion is new.  Assessment/Plan:  54 year old woman admitted 03/13/15 with chest pain and shortness of breath. The patient has a history of poorly controlled diabetes, hypertension, and depression. She had  pericardial effusion and pleural effusion.  She is being Rx'd with NSAID and antibiotics.   Principal Problem:   Pericardial effusion Active Problems:   Diabetes type 1, uncontrolled   Benign essential HTN   Hypothyroidism   Dyslipidemia   CAP (community acquired pneumonia)   Dyspnea   PLAN:   Continue NSAIDs x 3 weeks and colchine x 3 months prob reasonable to discharge and followup as outpt next week  If that desired call and will arrange  followup

## 2015-03-16 NOTE — Progress Notes (Signed)
[facility] PROGRESS NOTE  Suzanne Allen [id number] DOB: [birth year] DOA: 03/13/2015 PCP: Barrie Lyme, FNP  Assessment/Plan: Active Problems:   Chest pain - Most likely due to pericardial effusion. Presumed to be secondary to viral infection. - Echo repeated by cardiology. - Troponins negative 3 - Plan will be to continue NSAIDs for 3 weeks and colchicine for 3 weeks as well.  Diabetes type 1, uncontrolled -Continue Lantus, sliding scale insulin - Diabetic diet    Leukocytosis - will discontinue levaquin and observe overnight. - CT results is not convincing for pneumonia - May be reactive     Hypothyroidism - continue synthroid    Dyslipidemia - continue statin    Dyspnea - most likely due to effusions   Respiratory failure with hypoxia   Pericardial effusion   Code Status: full3 Family Communication: no faimly at bedside  Disposition Plan: Will continue to monitor may d/c next am.   Consultants:  Cardiology  Procedures:  echocardiogram  Antibiotics:  Levaquin  HPI/Subjective: Pt still complaining of some chest discomfort.  Objective: Filed Vitals:   03/16/15 0601  BP: 117/54  Pulse: 81  Temp: 98.1 F (36.7 C)  Resp: 16    Intake/Output Summary (Last 24 hours) at 03/16/15 1530 Last data filed at 03/16/15 0604  Gross per 24 hour  Intake      0 ml  Output      6 ml  Net     -6 ml   Filed Weights   03/13/15 1311 03/13/15 2135  Weight: 71.668 kg (158 lb) 75.07 kg (165 lb 8 oz)    Exam:   General:  Pt in nad, alert and awake  Cardiovascular: rrr, no mrg  Respiratory: cta bl, decreased breath sounds at bases, no wheezes  Abdomen: soft, ND, Nt  Musculoskeletal: no cyanosis   Data Reviewed: Basic Metabolic Panel:  Recent Labs Lab 03/13/15 1520 03/16/15 0314  NA 128* 138  K 3.8 3.5  CL 94* 102  CO2 25 25  GLUCOSE 463* 112*  BUN 24* 28*  CREATININE 1.29* 1.31*  CALCIUM 8.7* 8.3*   Liver Function  Tests:  Recent Labs Lab 03/13/15 1520  AST 16  ALT 15  ALKPHOS 88  BILITOT 0.7  PROT 7.2  ALBUMIN 2.9*    Recent Labs Lab 03/13/15 1520  LIPASE 25   No results for input(s): AMMONIA in the last 168 hours. CBC:  Recent Labs Lab 03/13/15 1520 03/16/15 0314  WBC 14.5* 10.3  HGB 11.1* 11.0*  HCT 32.2* 32.7*  MCV 87.0 89.8  PLT 426* 413*   Cardiac Enzymes:  Recent Labs Lab 03/13/15 2033 03/14/15 0328 03/14/15 0755  TROPONINI <0.03 <0.03 <0.03   BNP (last 3 results)  Recent Labs  03/13/15 2033  BNP 132.0*    ProBNP (last 3 results) No results for input(s): PROBNP in the last 8760 hours.  CBG:  Recent Labs Lab 03/15/15 1228 03/15/15 1650 03/15/15 2111 03/16/15 0802 03/16/15 1311  GLUCAP 124* 121* 222* 84 121*    Recent Results (from the past 240 hour(s))  Culture, blood (routine x 2)     Status: None (Preliminary result)   Collection Time: 03/13/15  3:53 PM  Result Value Ref Range Status   Specimen Description BLOOD LEFT FOREARM  Final   Special Requests BOTTLES DRAWN AEROBIC AND ANAEROBIC 10ML  Final   Culture NO GROWTH 3 DAYS  Final   Report Status PENDING  Incomplete  Culture, blood (routine x 2)  Status: None (Preliminary result)   Collection Time: 03/13/15  3:55 PM  Result Value Ref Range Status   Specimen Description BLOOD RIGHT FOREARM  Final   Special Requests BOTTLES DRAWN AEROBIC AND ANAEROBIC 5CC  Final   Culture NO GROWTH 3 DAYS  Final   Report Status PENDING  Incomplete     Studies: No results found.  Scheduled Meds: . colchicine  0.6 mg Oral Daily  . DULoxetine  60 mg Oral Daily  . ibuprofen  600 mg Oral TID  . insulin aspart  0-5 Units Subcutaneous QHS  . insulin aspart  0-9 Units Subcutaneous TID WC  . insulin aspart  10 Units Subcutaneous TID AC  . insulin glargine  40 Units Subcutaneous BID  . lamoTRIgine  100 mg Oral Daily  . levofloxacin (LEVAQUIN) IV  750 mg Intravenous Q48H  . levothyroxine  137 mcg Oral  QAC breakfast  . pantoprazole  40 mg Oral Daily  . rosuvastatin  40 mg Oral Daily   Continuous Infusions:   Time spent: > 35 minutes  Velvet Bathe  [facility] Pager [phone]. If 7PM-7AM, please contact night-coverage at [email or web address], password [facility] 03/16/2015, 3:30 PM  LOS: 3 days

## 2015-03-16 NOTE — Progress Notes (Signed)
  ANTIBIOTIC CONSULT NOTE   Pharmacy Consult for levofloxacin Indication: CAP  Allergies  Allergen Reactions  . Erythromycin Anaphylaxis  . Vancomycin Other (See Comments)    Red Man Syndrome    Patient Measurements: Height: 5\' 2"  (157.5 cm) Weight: 165 lb 8 oz (75.07 kg) IBW/kg (Calculated) : 50.1   Vital Signs: Temp: 98.1 F (36.7 C) (09/03 0601) Temp Source: Oral (09/03 0601) BP: 117/54 mmHg (09/03 0601) Pulse Rate: 81 (09/03 0601) Intake/Output from previous day: 09/02 0701 - 09/03 0700 In: 240 [P.O.:240] Out: 7 [Urine:5; Stool:2] Intake/Output from this shift:    Labs:  Recent Labs  03/13/15 1520 03/16/15 0314  WBC 14.5* 10.3  HGB 11.1* 11.0*  PLT 426* 413*  CREATININE 1.29* 1.31*   Estimated Creatinine Clearance: 47.1 mL/min (by C-G formula based on Cr of 1.31). No results for input(s): VANCOTROUGH, VANCOPEAK, VANCORANDOM, GENTTROUGH, GENTPEAK, GENTRANDOM, TOBRATROUGH, TOBRAPEAK, TOBRARND, AMIKACINPEAK, AMIKACINTROU, AMIKACIN in the last 72 hours.   Microbiology: Recent Results (from the past 720 hour(s))  Culture, blood (routine x 2)     Status: None (Preliminary result)   Collection Time: 03/13/15  3:53 PM  Result Value Ref Range Status   Specimen Description BLOOD LEFT FOREARM  Final   Special Requests BOTTLES DRAWN AEROBIC AND ANAEROBIC 10ML  Final   Culture NO GROWTH 3 DAYS  Final   Report Status PENDING  Incomplete  Culture, blood (routine x 2)     Status: None (Preliminary result)   Collection Time: 03/13/15  3:55 PM  Result Value Ref Range Status   Specimen Description BLOOD RIGHT FOREARM  Final   Special Requests BOTTLES DRAWN AEROBIC AND ANAEROBIC 5CC  Final   Culture NO GROWTH 3 DAYS  Final   Report Status PENDING  Incomplete    Medical History: Past Medical History  Diagnosis Date  . HTN (hypertension)   . Macular degeneration, bilateral   . Hypercholesteremia   . Hypothyroidism   . Stroke ? date & 09/22/2012  . Peripheral  neuropathy   . Anxiety   . Depression   . PTSD (post-traumatic stress disorder)   . 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 Fatty liver 04/04/2014  . Carpal tunnel syndrome 11/28/2014    Bilateral  . CAP (community acquired pneumonia) 03/13/2015  . Pneumonia 1993  . Diabetes mellitus type I dx'd 1977  . Diabetic osteomyelitis     right great toe/progress notes 09/23/2012  . Migraines 1980's    "when I was in my 20's" (03/14/2015)  . Chronic pancreatitis   . Diabetic retinopathy   . TIA (transient ischemic attack) 09/22/2012    [name] 10/25/2012    Assessment: 54 year old female on Day #4 of antibiotics for possible CAP.  She is afebrile and her WBC is declining.  Her serum creatinine is elevated but stable.  Her Levaquin regimen has been adjusted for CrCl <50.  Plan:  Continue Levofloxacin 750mg  IV q48h Consider discontinuing after her next dose on 9/5 as this will complete a 7 day course of antibiotics for pneumonia.  Legrand Como, Pharm.D., BCPS, AAHIVP Clinical Pharmacist Phone: [phone] or [phone] 03/16/2015, 1:35 PM

## 2015-03-16 NOTE — Progress Notes (Signed)
Echocardiogram 2D Echocardiogram limited has been performed.  Tresa Res 03/16/2015, 8:48 AM

## 2015-03-17 LAB — GLUCOSE, CAPILLARY
Glucose-Capillary: 122 mg/dL — ABNORMAL HIGH (ref 65–99)
Glucose-Capillary: 102 mg/dL — ABNORMAL HIGH (ref 65–99)

## 2015-03-17 MED ORDER — IBUPROFEN 600 MG PO TABS: 600.0000 mg | ORAL_TABLET | Freq: Three times a day (TID) | ORAL | Status: DC

## 2015-03-17 MED ORDER — COLCHICINE 0.6 MG PO TABS: 0.6000 mg | ORAL_TABLET | Freq: Every day | ORAL | Status: DC

## 2015-03-17 NOTE — Progress Notes (Signed)
Discharge home. Home discharge instruction given, no question verbalized. 

## 2015-03-17 NOTE — Progress Notes (Signed)
Subjective:  Admitted with sob and found to have plerual and  pericardial effusion   Initial echo suggested some respiratroy variation in inflow suggestive of early tamponade physiology, but no systemic evidence with HR or BP Repeat echo today shows no change in fluid size but also NO evidence of tamponade physiology She remains on NSAIDS and colchicine  No chest pain No dyspnea  Objective:  Vital Signs in the last 24 hours: Temp:  [98 F (36.7 C)-98.6 F (37 C)] 98 F (36.7 C) (09/04 0539) Pulse Rate:  [79-83] 79 (09/04 0539) Resp:  [18] 18 (09/04 0539) BP: (114-119)/(65-69) 114/65 mmHg (09/04 0539) SpO2:  [96 %-97 %] 96 % (09/04 0539)  Intake/Output from previous day: No intake or output data in the 24 hours ending 03/17/15 1131  Physical Exam: General appearance: alert, cooperative, no distress and moderately obese Neck: no JVD Lungs: decreased breath sounds at bases Heart: regular rate and rhythm and no rub Extremities: extremities normal, atraumatic, no cyanosis or edema Skin: Skin color, texture, turgor normal. No rashes or lesions Neurologic: Grossly normal   Rate: 80  Rhythm: normal sinus rhythm  Lab Results:  Recent Labs  03/16/15 0314  WBC 10.3  HGB 11.0*  PLT 413*    Recent Labs  03/16/15 0314  NA 138  K 3.5  CL 102  CO2 25  GLUCOSE 112*  BUN 28*  CREATININE 1.31*   No results for input(s): TROPONINI in the last 72 hours.  Invalid input(s): CK, MB No results for input(s): INR in the last 72 hours.  Scheduled Meds: . colchicine  0.6 mg Oral Daily  . DULoxetine  60 mg Oral Daily  . ibuprofen  600 mg Oral TID  . insulin aspart  0-5 Units Subcutaneous QHS  . insulin aspart  0-9 Units Subcutaneous TID WC  . insulin aspart  10 Units Subcutaneous TID AC  . insulin glargine  40 Units Subcutaneous BID  . lamoTRIgine  100 mg Oral Daily  . levothyroxine  137 mcg Oral QAC breakfast  . pantoprazole  40 mg Oral Daily  . rosuvastatin  40 mg  Oral Daily   Continuous Infusions:  PRN Meds:.clonazePAM, ondansetron   Imaging: No results found.  Cardiac Studies: Echo 03/14/15 Study Conclusions  - Left ventricle: The cavity size was normal. There was mild concentric hypertrophy. Systolic function was normal. The estimated ejection fraction was in the range of 60% to 65%. Wall motion was normal; there were no regional wall motion abnormalities. Doppler parameters are consistent with abnormal left ventricular relaxation (grade 1 diastolic dysfunction). - Mitral valve: Calcified annulus. There was mild regurgitation. - Pericardium, extracardiac: A moderate pericardial effusion was identified circumferential to the heart. The fluid exhibited hematoma appearance around apex. There was mildright ventricular chamber collapse. There was mildright atrial chamber collapse. Features were consistent with mild tamponade physiology. There was a moderate-sized left pleural effusion.  Impressions:  - When compared to prior study, pericardial effusion is new.  Assessment/Plan:  54 year old woman admitted 03/13/15 with chest pain and shortness of breath. The patient has a history of poorly controlled diabetes, hypertension, and depression. She had pericardial effusion and pleural effusion.  She is being Rx'd with NSAID and antibiotics.   Principal Problem:   Pericardial effusion Active Problems:   Diabetes type 1, uncontrolled   Benign essential HTN   Hypothyroidism   Dyslipidemia   CAP (community acquired pneumonia)   Dyspnea   PLAN:   Continue NSAIDs x 3 weeks and  colchine x 3 months  Will arrange office fulllowup

## 2015-03-17 NOTE — Discharge Summary (Signed)
Physician Discharge Summary  Suzanne Allen [id number] DOB: [birth year] DOA: 03/13/2015  PCP: Barrie Lyme, FNP  Admit date: 03/13/2015 Discharge date: 03/17/2015  Time spent: > 35 minutes  Recommendations for Outpatient Follow-up:  1. Recommend f/u with cardiologist or primary care physician  Discharge Diagnoses:  Principal Problem:   Pericardial effusion Active Problems:   Diabetes type 1, uncontrolled   Benign essential HTN   Hypothyroidism   Dyslipidemia   CAP (community acquired pneumonia)   Dyspnea   Discharge Condition: stable  Diet recommendation: Heart healthy/carb modified diet  Filed Weights   03/13/15 1311 03/13/15 2135  Weight: 71.668 kg (158 lb) 75.07 kg (165 lb 8 oz)    History of present illness:  From original HPI 54 y.o. female has a past medical history significant for poorly controlled diabetes, hypertension, hypothyroidism, depression, presents to the emergency room with a chief complaint of shortness of breath and chest pain.   Hospital Course:  Chest pain - Most likely from pericardial effusion. Presumed to be 2ary to viral infection. - Plan is for colchicine and nsaids for 3 weeks. - improving daily on nsaids and colchicine  SOB - resolved. Most likely due to effusions. Expected to improve on nsaids and colchicine. - pt breathing comfortably on room air.  Procedures:  None  Consultations:  Cardiology  Discharge Exam: Filed Vitals:   03/17/15 0539  BP: 114/65  Pulse: 79  Temp: 98 F (36.7 C)  Resp: 18    General: Pt in nad, alert and awake Cardiovascular: rrr, no mrg Respiratory: cta bl, no wheezes, no increased wob on room air, equal chest rise.  Discharge Instructions   Discharge Instructions    Call MD for:  difficulty breathing, headache or visual disturbances    Complete by:  As directed      Call MD for:  severe uncontrolled pain    Complete by:  As directed      Call MD for:  temperature >100.4    Complete by:   As directed      Diet - low sodium heart healthy    Complete by:  As directed      Discharge instructions    Complete by:  As directed   Please take ibuprofen and colchicine for 3 weeks. Follow up with your primary care physician within the next 1-3 weeks or sooner should any new concerns arise.     Increase activity slowly    Complete by:  As directed           Current Discharge Medication List    START taking these medications   Details  colchicine 0.6 MG tablet Take 1 tablet (0.6 mg total) by mouth daily. Qty: 21 tablet, Refills: 0    ibuprofen (ADVIL,MOTRIN) 600 MG tablet Take 1 tablet (600 mg total) by mouth 3 (three) times daily. Qty: 60 tablet, Refills: 0      CONTINUE these medications which have NOT CHANGED   Details  CRESTOR 40 MG tablet Take 1 tablet by mouth daily Refills: 5    DULoxetine (CYMBALTA) 60 MG capsule Take 1 capsule (60 mg total) by mouth daily. Qty: 30 capsule, Refills: 2   Associated Diagnoses: Major depressive disorder, recurrent episode, mild    FOLIC ACID PO Take 1 tablet by mouth daily.    insulin aspart (NOVOLOG) 100 UNIT/ML injection Inject 20 Units into the skin 3 (three) times daily before meals.     insulin glargine (LANTUS) 100 UNIT/ML injection Inject 40 Units into  the skin 2 (two) times daily.    lamoTRIgine (LAMICTAL) 100 MG tablet Take 1 tablet (100 mg total) by mouth daily. Qty: 60 tablet, Refills: 5    levothyroxine (SYNTHROID, LEVOTHROID) 137 MCG tablet Take 137 mcg by mouth daily.      clonazePAM (KLONOPIN) 0.5 MG tablet Take 1/2 tab in am and 1/2 at bed time Qty: 30 tablet, Refills: 1   Associated Diagnoses: Major depressive disorder, recurrent episode, mild      STOP taking these medications     amLODipine (NORVASC) 5 MG tablet      lisinopril-hydrochlorothiazide (PRINZIDE,ZESTORETIC) 20-25 MG per tablet      milk thistle 175 MG tablet      ondansetron (ZOFRAN) 4 MG tablet        Allergies  Allergen Reactions   . Erythromycin Anaphylaxis  . Vancomycin Other (See Comments)    Red Man Syndrome      The results of significant diagnostics from this hospitalization (including imaging, microbiology, ancillary and laboratory) are listed below for reference.    Significant Diagnostic Studies: Dg Chest 2 View  03/13/2015   CLINICAL DATA:  54 year old female with shortness of breath and chest pain on the left for 2 weeks. Initial encounter.  EXAM: CHEST  2 VIEW  COMPARISON:  04/03/2014 and earlier.  FINDINGS: Chronic low lung volumes. Stable mild cardiomegaly. Other mediastinal contours are within normal limits. Visualized tracheal air column is within normal limits. No pneumothorax. No pulmonary edema. Chronic blunting of the costophrenic angles. There is left greater than right streaky increased lower lobe opacity. No consolidation. No acute osseous abnormality identified.  IMPRESSION: Low lung volumes with left greater than right nonspecific streaky lung base opacity. Chronic blunting of the costophrenic angles. The appearance is nonspecific. Lung base pneumonia cannot be excluded.   Electronically Signed   By: Genevie Ann M.D.   On: 03/13/2015 15:21   Ct Angio Chest Pe W/cm &/or Wo Cm  03/13/2015   CLINICAL DATA:  54 year old female with shortness of breath and chest pain.  EXAM: CT ANGIOGRAPHY CHEST WITH CONTRAST  TECHNIQUE: Multidetector CT imaging of the chest was performed using the standard protocol during bolus administration of intravenous contrast. Multiplanar CT image reconstructions and MIPs were obtained to evaluate the vascular anatomy.  CONTRAST:  185mL OMNIPAQUE IOHEXOL 350 MG/ML SOLN  COMPARISON:  None.  FINDINGS: Mediastinum: Normal heart size. There is a small to moderate pericardial effusion, image 59/ series 4. This measures up to 1.7 cm in thickness. The main pulmonary artery is normal. No left or right pulmonary emboli identified. No lobar or segmental pulmonary artery filling defects identified  to suggest a clinically significant acute pulmonary embolus.  Lungs/Pleura: Small to moderate bilateral pleural effusions are identified. Overlying compressive type atelectasis noted.  Upper Abdomen: Hepatic steatosis noted. The visualized portions of the spleen and adrenal glands are unremarkable.  Musculoskeletal: No aggressive lytic or sclerotic bone lesions identified.  Review of the MIP images confirms the above findings.  IMPRESSION: 1. No evidence for acute pulmonary embolus. 2. Pericardial effusion. 3. Bilateral pleural effusions with overlying atelectasis.   Electronically Signed   By: Kerby Moors M.D.   On: 03/13/2015 18:32   Mr Jeri Cos [id number] Contrast  03/07/2015     [facility] [address] [place], [place] [address] [phone]  NEUROIMAGING REPORT   STUDY DATE: 03/07/2015 PATIENT NAME: Syra Sickinger DOB: [birth year] MRN: [id number]  EXAM: MRI Brain with and without contrast  ORDERING  CLINICIAN: Richard A. Sater, MD. PhD CLINICAL HISTORY: 54 year old woman gait disturbance and numbness COMPARISON FILMS: None  TECHNIQUE:MRI of the brain with and without contrast was obtained  utilizing 5 mm axial slices with T1, T2, T2 flair, SWI and diffusion  weighted views.  T1 sagittal, T2 coronal and postcontrast views in the  axial and coronal plane were obtained. CONTRAST: 14 ml [facility] IMAGING SITE: [facility], [place].  FINDINGS: On sagittal images, the spinal cord is imaged caudally to C3 and  is normal in caliber.   The contents of the posterior fossa are of normal  size and position.   The pituitary gland and optic chiasm appear normal.     Brain volume appears normal.   The ventricles are normal in size and  without distortion.  There are no abnormal extra-axial collections of  fluid.    The cerebellum and brainstem appears normal.   The deep gray matter  appears normal.  The cerebral hemispheres appear normal.  The orbits  appear normal.   The VIIth/VIIIth  nerve complex appears normal.  The  mastoid air cells appear normal.  There is a retention cyst at the floor  of the right maxillary sinus. The other paranasal sinuses appear normal.   Flow voids are identified within the major intracerebral arteries.     Diffusion weighted images are normal.  Susceptibility weighted images are  normal.   After the infusion of contrast material, a normal enhancement  pattern is noted.       03/07/2015    This is a normal MRI of the brain with and without contrast.   There are no acute findings.    INTERPRETING PHYSICIAN:  Richard A. Sater, MD, PhD Certified in  Neuroimaging by [employer]      Microbiology: Recent Results (from the past 240 hour(s))  Culture, blood (routine x 2)     Status: None (Preliminary result)   Collection Time: 03/13/15  3:53 PM  Result Value Ref Range Status   Specimen Description BLOOD LEFT FOREARM  Final   Special Requests BOTTLES DRAWN AEROBIC AND ANAEROBIC 10ML  Final   Culture NO GROWTH 3 DAYS  Final   Report Status PENDING  Incomplete  Culture, blood (routine x 2)     Status: None (Preliminary result)   Collection Time: 03/13/15  3:55 PM  Result Value Ref Range Status   Specimen Description BLOOD RIGHT FOREARM  Final   Special Requests BOTTLES DRAWN AEROBIC AND ANAEROBIC 5CC  Final   Culture NO GROWTH 3 DAYS  Final   Report Status PENDING  Incomplete     Labs: Basic Metabolic Panel:  Recent Labs Lab 03/13/15 1520 03/16/15 0314  NA 128* 138  K 3.8 3.5  CL 94* 102  CO2 25 25  GLUCOSE 463* 112*  BUN 24* 28*  CREATININE 1.29* 1.31*  CALCIUM 8.7* 8.3*   Liver Function Tests:  Recent Labs Lab 03/13/15 1520  AST 16  ALT 15  ALKPHOS 88  BILITOT 0.7  PROT 7.2  ALBUMIN 2.9*    Recent Labs Lab 03/13/15 1520  LIPASE 25   No results for input(s): AMMONIA in the last 168 hours. CBC:  Recent Labs Lab 03/13/15 1520 03/16/15 0314  WBC 14.5* 10.3  HGB 11.1* 11.0*  HCT 32.2* 32.7*  MCV  87.0 89.8  PLT 426* 413*   Cardiac Enzymes:  Recent Labs Lab 03/13/15 2033 03/14/15 0328 03/14/15 0755  TROPONINI <0.03 <0.03 <0.03  BNP: BNP (last 3 results)  Recent Labs  03/13/15 2033  BNP 132.0*    ProBNP (last 3 results) No results for input(s): PROBNP in the last 8760 hours.  CBG:  Recent Labs Lab 03/16/15 1311 03/16/15 1719 03/16/15 2128 03/17/15 0732 03/17/15 1214  GLUCAP 121* 158* 128* 122* 102*       Signed:  Velvet Bathe  [facility] 03/17/2015, 12:29 PM

## 2015-03-18 LAB — CULTURE, BLOOD (ROUTINE X 2)
Culture: NO GROWTH
Culture: NO GROWTH

## 2015-03-20 NOTE — Telephone Encounter (Signed)
I have spoken with Suzanne Allen this morning and per RAS, advised that the mri of her brain was normal/fim

## 2015-03-20 NOTE — Telephone Encounter (Signed)
-----   Message from Britt Bottom, MD sent at 03/07/2015  8:46 PM EDT ----- Please let her know that the MRI of the brain was normal.

## 2015-03-25 ENCOUNTER — Ambulatory Visit (INDEPENDENT_AMBULATORY_CARE_PROVIDER_SITE_OTHER): Payer: Medicaid Other | Admitting: Nurse Practitioner

## 2015-03-25 ENCOUNTER — Encounter: Payer: Self-pay | Admitting: Nurse Practitioner

## 2015-03-25 VITALS — BP 110/78 | HR 95 | Ht 62.0 in | Wt 161.4 lb

## 2015-03-25 DIAGNOSIS — I313 Pericardial effusion (noninflammatory): Secondary | ICD-10-CM

## 2015-03-25 DIAGNOSIS — I10 Essential (primary) hypertension: Secondary | ICD-10-CM | POA: Diagnosis not present

## 2015-03-25 DIAGNOSIS — I319 Disease of pericardium, unspecified: Secondary | ICD-10-CM | POA: Diagnosis not present

## 2015-03-25 DIAGNOSIS — I3139 Other pericardial effusion (noninflammatory): Secondary | ICD-10-CM

## 2015-03-25 DIAGNOSIS — R079 Chest pain, unspecified: Secondary | ICD-10-CM | POA: Diagnosis not present

## 2015-03-25 MED ORDER — IBUPROFEN 600 MG PO TABS: 600.0000 mg | ORAL_TABLET | Freq: Three times a day (TID) | ORAL | Status: DC

## 2015-03-25 MED ORDER — COLCHICINE 0.6 MG PO TABS: 0.6000 mg | ORAL_TABLET | Freq: Two times a day (BID) | ORAL | Status: DC

## 2015-03-25 NOTE — Patient Instructions (Addendum)
We will be checking the following labs today -    Medication Instructions:    Continue with your current medicines but  We are going to increase the Colchicine to twice a day for a total of 3 months  Stay on the NSAID - most likely for 6 weeks  If any stomach upset - take OTC Prilosec or Nexium daily.     Testing/Procedures To Be Arranged:  Limited echocardiogram  Follow-Up:   See me in 2 weeks    Other Special Instructions:   Call for any aching/myalgias.   Call the [place] office at [phone] if you have any questions, problems or concerns.

## 2015-03-25 NOTE — Progress Notes (Signed)
CARDIOLOGY OFFICE NOTE  Date:  03/25/2015    Suzanne Allen Date of Birth: 1960-07-25 Medical Record [id number]  PCP:  Barrie Lyme, FNP  Cardiologist:  Burt Knack    Chief Complaint  Patient presents with  . Pericardial Effusion    Post hospital visit - seen for Dr. Burt Knack.     History of Present Illness: Suzanne Allen is a 54 y.o. female who presents today for a post hospital visit. Seen for Dr. Burt Knack - he has not seen since 2014.   She has a history of poorly controlled DM, HTN, hypothyroidism, & depression.  Presented to the ER with chest pain and shortness of breath - found to have pericardial and pleural effusion - presumed viral - given colchicine (for 3 months)and NSAID for 3 weeks. Initial echo suggested some respiratroy variation in flow suggestive of early tamponade physiology, but no systemic evidence with HR or BP. Repeat echo with no change in fluid size but also NO evidence of tamponade physiology  Comes back today. Here alone. Says her "heart hurts". She notes that she is feeling better - probably about 50% better in regards to her chest pain/shortness of breath. She is taking her medicines. On colchicine just once a day - no stomach upset or diarrhea noted.   Past Medical History  Diagnosis Date  . HTN (hypertension)   . Macular degeneration, bilateral   . Hypercholesteremia   . Hypothyroidism   . Stroke ? date & 09/22/2012  . Peripheral neuropathy   . Anxiety   . Depression   . PTSD (post-traumatic stress disorder)   . 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 Fatty liver 04/04/2014  . Carpal tunnel syndrome 11/28/2014    Bilateral  . CAP (community acquired pneumonia) 03/13/2015  . Pneumonia 1993  . Diabetes mellitus type I dx'd 1977  . Diabetic osteomyelitis     right great toe/progress notes 09/23/2012  . Migraines 1980's    "when I was in my 20's" (03/14/2015)  . Chronic pancreatitis   . Diabetic retinopathy   . TIA (transient  ischemic attack) 09/22/2012    [name] 10/25/2012    Past Surgical History  Procedure Laterality Date  . Cesarean section  1989; 1995  . Tonsillectomy  1965  . Refractive surgery Bilateral 2013  . Total thyroidectomy  2012  . Peripherally inserted central catheter insertion Right 08/31/2012  . I&d extremity Right multiple    "big toe"  . Toe amputation Right 11/2012    "big toe"  . Tubal ligation  1995  . Eye surgery Bilateral     "hemorrhaged behind my eye"  . Picc line removal ([facility] hx) Right 10/12/2012    [name] 10/12/2012     Medications: Current Outpatient Prescriptions  Medication Sig Dispense Refill  . clonazePAM (KLONOPIN) 0.5 MG tablet Take 1/2 tab in am and 1/2 at bed time 30 tablet 1  . colchicine 0.6 MG tablet Take 1 tablet (0.6 mg total) by mouth 2 (two) times daily. 60 tablet 3  . CRESTOR 40 MG tablet Take 1 tablet by mouth daily  5  . DULoxetine (CYMBALTA) 60 MG capsule Take 1 capsule (60 mg total) by mouth daily. 30 capsule 2  . FOLIC ACID PO Take 1 tablet by mouth daily.    Marland Kitchen ibuprofen (ADVIL,MOTRIN) 600 MG tablet Take 1 tablet (600 mg total) by mouth 3 (three) times daily. 90 tablet 1  . insulin aspart (NOVOLOG) 100 UNIT/ML injection Inject 20 Units  into the skin 3 (three) times daily before meals.     . insulin glargine (LANTUS) 100 UNIT/ML injection Inject 40 Units into the skin 2 (two) times daily.    Marland Kitchen lamoTRIgine (LAMICTAL) 100 MG tablet Take 1 tablet (100 mg total) by mouth daily. 60 tablet 5  . levothyroxine (SYNTHROID, LEVOTHROID) 137 MCG tablet Take 137 mcg by mouth daily.       No current facility-administered medications for this visit.    Allergies: Allergies  Allergen Reactions  . Erythromycin Anaphylaxis  . Vancomycin Other (See Comments)    Red Man Syndrome    Social History: The patient  reports that she has never smoked. She has never used smokeless tobacco. She reports that she does not drink alcohol or use illicit drugs.   Family  History: The patient's family history includes Alcohol abuse in her father and sister; Depression in her mother.   Review of Systems: Please see the history of present illness.   Otherwise, the review of systems is positive for none.   All other systems are reviewed and negative.   Physical Exam: VS:  BP 110/78 mmHg  Pulse 95  Ht 5\' 2"  ([id number] m)  Wt 161 lb 6.4 oz (73.211 kg)  BMI 29.51 kg/m2 .  BMI Body mass index is 29.51 kg/(m^2).  Wt Readings from Last 3 Encounters:  03/25/15 161 lb 6.4 oz (73.211 kg)  03/13/15 165 lb 8 oz (75.07 kg)  02/21/15 158 lb 9.6 oz (71.94 kg)    General: Pleasant. Well developed, well nourished and in no acute distress.  HEENT: Normal. Neck: Supple, no JVD, carotid bruits, or masses noted.  Cardiac: Regular rate and rhythm. She has a friction rub noted. No edema.  Respiratory:  Lungs are clear to auscultation bilaterally with normal work of breathing.  GI: Soft and nontender.  MS: No deformity or atrophy. Gait and ROM intact. Skin: Warm and dry. Color is normal.  Neuro:  Strength and sensation are intact and no gross focal deficits noted.  Psych: Alert, appropriate and with normal affect.   LABORATORY DATA:  EKG:  EKG is ordered today. This demonstrates NSR with nonspecific changes. No ST elevation noted. Reviewed with Dr. Burt Knack.  Lab Results  Component Value Date   WBC 10.3 03/16/2015   HGB 11.0* 03/16/2015   HCT 32.7* 03/16/2015   PLT 413* 03/16/2015   GLUCOSE 112* 03/16/2015   CHOL 168 04/02/2014   TRIG 119 04/02/2014   HDL 43 04/02/2014   LDLCALC 101* 04/02/2014   ALT 15 03/13/2015   AST 16 03/13/2015   NA 138 03/16/2015   K 3.5 03/16/2015   CL 102 03/16/2015   CREATININE 1.31* 03/16/2015   BUN 28* 03/16/2015   CO2 25 03/16/2015   TSH 5.231* 03/13/2015   INR 1.07 04/02/2014   HGBA1C 16.0* 03/14/2015    BNP (last 3 results)  Recent Labs  03/13/15 2033  BNP 132.0*    ProBNP (last 3 results) No results for input(s):  PROBNP in the last 8760 hours.   Other Studies Reviewed Today: Initial Echo Study Conclusions from 03/14/2015  - Left ventricle: The cavity size was normal. There was mild concentric hypertrophy. Systolic function was normal. The estimated ejection fraction was in the range of 60% to 65%. Wall motion was normal; there were no regional wall motion abnormalities. Doppler parameters are consistent with abnormal left ventricular relaxation (grade 1 diastolic dysfunction). - Mitral valve: Calcified annulus. There was mild regurgitation. - Pericardium, extracardiac: A  moderate pericardial effusion was identified circumferential to the heart. The fluid exhibited hematoma appearance around apex. There was mildright ventricular chamber collapse. There was mildright atrial chamber collapse. Features were consistent with mild tamponade physiology. There was a moderate-sized left pleural effusion.  Impressions:  - When compared to prior study, pericardial effusion is new.  Limited Echo Study Conclusions from 03/16/2015  - Left ventricle: The cavity size was normal. Wall thickness was normal. Systolic function was normal. The estimated ejection fraction was in the range of 60% to 65%. Wall motion was normal; there were no regional wall motion abnormalities. - Pericardium, extracardiac: A moderate, free-flowing pericardial effusion was identified circumferential to the heart. There was no evidence of hemodynamic compromise.  Impressions:  - Compared to the 03/14/15, the size of the pericardial effusion is unchanged, but the signs of tamponade are no longer present. There is only intermittent and mild RA collapse.  CT ANGIOGRAPHY CHEST WITH CONTRAST  FINDINGS: Mediastinum: Normal heart size. There is a small to moderate pericardial effusion, image 59/ series 4. This measures up to 1.7 cm in thickness. The main pulmonary artery is normal. No left or  right pulmonary emboli identified. No lobar or segmental pulmonary artery filling defects identified to suggest a clinically significant acute pulmonary embolus.  Lungs/Pleura: Small to moderate bilateral pleural effusions are identified. Overlying compressive type atelectasis noted.  Upper Abdomen: Hepatic steatosis noted. The visualized portions of the spleen and adrenal glands are unremarkable.  Musculoskeletal: No aggressive lytic or sclerotic bone lesions identified.  Review of the MIP images confirms the above findings.  IMPRESSION: 1. No evidence for acute pulmonary embolus. 2. Pericardial effusion. 3. Bilateral pleural effusions with overlying atelectasis.   Electronically Signed  By: Kerby Moors M.D.  On: 03/13/2015 18:32  Assessment/Plan: 1. Pericardial and pleural effusion - improved clinically but has definite friction rub - not heard at discharge - consider increasing colchicine to BID. Continue NSAID for probably 6 weeks.  Repeat limited echo. Discussed with Dr. Burt Knack and he is in agreement. See back in 2 weeks.   2. HTN - BP ok on current regimen.   3. Uncontrolled DM as reflected by her A1C  4. HLD - on statin - instructed to look for increased myalgias since on longer use of Colchicine  Current medicines are reviewed with the patient today.  The patient does not have concerns regarding medicines other than what has been noted above.  The following changes have been made:  See above.  Labs/ tests ordered today include:    Orders Placed This Encounter  Procedures  . EKG 12-Lead  . ECHO LIMITED     Disposition:   FU with me in 2 weeks.   Patient is agreeable to this plan and will call if any problems develop in the interim.   Signed: Burtis Junes, RN, ANP-C 03/25/2015 11:31 AM  [facility] [address] [place], [place]  [address] Phone: [phone] Fax: [phone]

## 2015-03-29 ENCOUNTER — Other Ambulatory Visit: Payer: Self-pay

## 2015-03-29 ENCOUNTER — Telehealth: Payer: Self-pay | Admitting: *Deleted

## 2015-03-29 ENCOUNTER — Ambulatory Visit (HOSPITAL_COMMUNITY): Payer: Medicaid Other | Attending: Cardiovascular Disease

## 2015-03-29 DIAGNOSIS — E119 Type 2 diabetes mellitus without complications: Secondary | ICD-10-CM | POA: Diagnosis not present

## 2015-03-29 DIAGNOSIS — I319 Disease of pericardium, unspecified: Secondary | ICD-10-CM | POA: Diagnosis not present

## 2015-03-29 DIAGNOSIS — E785 Hyperlipidemia, unspecified: Secondary | ICD-10-CM | POA: Diagnosis not present

## 2015-03-29 DIAGNOSIS — Z8249 Family history of ischemic heart disease and other diseases of the circulatory system: Secondary | ICD-10-CM | POA: Insufficient documentation

## 2015-03-29 DIAGNOSIS — I3139 Other pericardial effusion (noninflammatory): Secondary | ICD-10-CM

## 2015-03-29 DIAGNOSIS — R079 Chest pain, unspecified: Secondary | ICD-10-CM

## 2015-03-29 DIAGNOSIS — E039 Hypothyroidism, unspecified: Secondary | ICD-10-CM | POA: Diagnosis not present

## 2015-03-29 DIAGNOSIS — I10 Essential (primary) hypertension: Secondary | ICD-10-CM | POA: Insufficient documentation

## 2015-03-29 DIAGNOSIS — I313 Pericardial effusion (noninflammatory): Secondary | ICD-10-CM

## 2015-03-29 NOTE — Telephone Encounter (Signed)
Pt notified of Limited echo results by phone with verbal understanding. Pt states she is still not feeling better and still having sob, chest discomfort when breathing, pt states she thinks it is her lungs. I advised go to ED if SOB over wknd worse.

## 2015-04-01 ENCOUNTER — Other Ambulatory Visit: Payer: Self-pay | Admitting: *Deleted

## 2015-04-01 DIAGNOSIS — J90 Pleural effusion, not elsewhere classified: Secondary | ICD-10-CM

## 2015-04-02 ENCOUNTER — Ambulatory Visit
Admission: RE | Admit: 2015-04-02 | Discharge: 2015-04-02 | Disposition: A | Discharge status: Home / Self Care | Payer: Medicaid Other | Source: Ambulatory Visit | Attending: Nurse Practitioner | Admitting: Nurse Practitioner

## 2015-04-02 ENCOUNTER — Other Ambulatory Visit: Payer: Self-pay | Admitting: Nurse Practitioner

## 2015-04-02 DIAGNOSIS — J90 Pleural effusion, not elsewhere classified: Secondary | ICD-10-CM

## 2015-04-02 MED ORDER — FUROSEMIDE 20 MG PO TABS: 20.0000 mg | ORAL_TABLET | Freq: Every day | ORAL | Status: DC

## 2015-04-02 MED ORDER — DOXYCYCLINE HYCLATE 100 MG PO CAPS: 100.0000 mg | ORAL_CAPSULE | Freq: Two times a day (BID) | ORAL | Status: DC

## 2015-04-08 ENCOUNTER — Encounter: Payer: Self-pay | Admitting: Nurse Practitioner

## 2015-04-08 ENCOUNTER — Ambulatory Visit (INDEPENDENT_AMBULATORY_CARE_PROVIDER_SITE_OTHER): Payer: Medicaid Other | Admitting: Nurse Practitioner

## 2015-04-08 VITALS — BP 130/84 | HR 95 | Ht 62.0 in | Wt 161.8 lb

## 2015-04-08 DIAGNOSIS — I319 Disease of pericardium, unspecified: Secondary | ICD-10-CM | POA: Diagnosis not present

## 2015-04-08 DIAGNOSIS — J90 Pleural effusion, not elsewhere classified: Secondary | ICD-10-CM | POA: Diagnosis not present

## 2015-04-08 DIAGNOSIS — I10 Essential (primary) hypertension: Secondary | ICD-10-CM | POA: Diagnosis not present

## 2015-04-08 DIAGNOSIS — I3139 Other pericardial effusion (noninflammatory): Secondary | ICD-10-CM

## 2015-04-08 DIAGNOSIS — I313 Pericardial effusion (noninflammatory): Secondary | ICD-10-CM

## 2015-04-08 LAB — BASIC METABOLIC PANEL
BUN: 43 mg/dL — ABNORMAL HIGH (ref 6–23)
CO2: 27 mEq/L (ref 19–32)
Calcium: 8.6 mg/dL (ref 8.4–10.5)
Chloride: 100 mEq/L (ref 96–112)
Creatinine, Ser: 1.12 mg/dL (ref 0.40–1.20)
GFR: 54.02 mL/min — ABNORMAL LOW (ref >60.00)
Glucose, Bld: 191 mg/dL — ABNORMAL HIGH (ref 70–99)
Potassium: 3.5 mEq/L (ref 3.5–5.1)
Sodium: 138 mEq/L (ref 135–145)

## 2015-04-08 NOTE — Progress Notes (Signed)
CARDIOLOGY OFFICE NOTE  Date:  04/08/2015    [name] Date of Birth: 1961-05-10 Medical Record [id number]  PCP:  Barrie Lyme, FNP  Cardiologist:  Burt Knack    Chief Complaint  Patient presents with  . FU for pericardial/pleural effusion    Seen for Dr. Burt Knack    History of Present Illness: Suzanne Allen is a 54 y.o. female who presents today for a 2 week check. Seen for Dr. Burt Knack - he has not seen since 2014.   She has a history of poorly controlled DM, HTN, hypothyroidism, & depression.  Presented to the ER at the end of August with chest pain and shortness of breath - found to have pericardial and pleural effusion - presumed viral - given colchicine (for 3 months) and NSAID for 3 weeks. Initial echo suggested some respiratory variation in flow suggestive of early tamponade physiology, but no systemic evidence with HR or BP. Repeat echo with no change in fluid size but also NO evidence of tamponade physiology.  I saw her 2 weeks ago - she was still symptomatic. Limited echo updated - her pericardial effusion was resolved. We also got a repeat CXR - this showed progression of her pleural effusion/atelectasis. I then treated her with antibiotics and diuretics.   Comes back today. Here alone. She is feeling better. No more chest pain. Her breathing is back to her normal baseline. She says that she is now 90% improved. No abdominal upset. Has a few more doses of her antibiotic. No swelling. No cough. She is pleased with how she is doing.   Past Medical History  Diagnosis Date  . HTN (hypertension)   . Macular degeneration, bilateral   . Hypercholesteremia   . Hypothyroidism   . Stroke ? date & 09/22/2012  . Peripheral neuropathy   . Anxiety   . Depression   . PTSD (post-traumatic stress disorder)   . 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 Fatty liver 04/04/2014  . Carpal tunnel syndrome 11/28/2014    Bilateral  . CAP (community acquired pneumonia)  03/13/2015  . Pneumonia 1993  . Diabetes mellitus type I dx'd 1977  . Diabetic osteomyelitis     right great toe/progress notes 09/23/2012  . Migraines 1980's    "when I was in my 20's" (03/14/2015)  . Chronic pancreatitis   . Diabetic retinopathy   . TIA (transient ischemic attack) 09/22/2012    [name] 10/25/2012    Past Surgical History  Procedure Laterality Date  . Cesarean section  1989; 1995  . Tonsillectomy  1965  . Refractive surgery Bilateral 2013  . Total thyroidectomy  2012  . Peripherally inserted central catheter insertion Right 08/31/2012  . I&d extremity Right multiple    "big toe"  . Toe amputation Right 11/2012    "big toe"  . Tubal ligation  1995  . Eye surgery Bilateral     "hemorrhaged behind my eye"  . Picc line removal ([facility] hx) Right 10/12/2012    [name] 10/12/2012     Medications: Current Outpatient Prescriptions  Medication Sig Dispense Refill  . clonazePAM (KLONOPIN) 0.5 MG tablet Take 1/2 tab in am and 1/2 at bed time 30 tablet 1  . colchicine 0.6 MG tablet Take 1 tablet (0.6 mg total) by mouth 2 (two) times daily. 60 tablet 3  . CRESTOR 40 MG tablet Take 1 tablet by mouth daily  5  . doxycycline (VIBRAMYCIN) 100 MG capsule Take 1 capsule (100 mg total)  by mouth 2 (two) times daily. 14 capsule 0  . DULoxetine (CYMBALTA) 60 MG capsule Take 1 capsule (60 mg total) by mouth daily. 30 capsule 2  . FOLIC ACID PO Take 1 tablet by mouth daily.    . furosemide (LASIX) 20 MG tablet Take 1 tablet (20 mg total) by mouth daily. 10 tablet 0  . ibuprofen (ADVIL,MOTRIN) 600 MG tablet Take 1 tablet (600 mg total) by mouth 3 (three) times daily. 90 tablet 1  . insulin aspart (NOVOLOG) 100 UNIT/ML injection Inject 20 Units into the skin 3 (three) times daily before meals.     . insulin glargine (LANTUS) 100 UNIT/ML injection Inject 40 Units into the skin 2 (two) times daily.    Marland Kitchen lamoTRIgine (LAMICTAL) 100 MG tablet Take 1 tablet (100 mg total) by mouth daily. 60 tablet 5  .  levothyroxine (SYNTHROID, LEVOTHROID) 137 MCG tablet Take 137 mcg by mouth daily.       No current facility-administered medications for this visit.    Allergies: Allergies  Allergen Reactions  . Erythromycin Anaphylaxis  . Vancomycin Other (See Comments)    Red Man Syndrome    Social History: The patient  reports that she has never smoked. She has never used smokeless tobacco. She reports that she does not drink alcohol or use illicit drugs.   Family History: The patient's family history includes Alcohol abuse in her father and sister; Depression in her mother.   Review of Systems: Please see the history of present illness.   Otherwise, the review of systems is positive for DOE, depression, excessive sweating and anxiety.   All other systems are reviewed and negative.   Physical Exam: VS:  BP 130/84 mmHg  Pulse 95  Ht 5\' 2"  (1.575 m)  Wt 161 lb 12.8 oz (73.392 kg)  BMI 29.59 kg/m2  SpO2 96% .  BMI Body mass index is 29.59 kg/(m^2).  Wt Readings from Last 3 Encounters:  04/08/15 161 lb 12.8 oz (73.392 kg)  03/25/15 161 lb 6.4 oz (73.211 kg)  03/13/15 165 lb 8 oz (75.07 kg)    General: Pleasant. Well developed, well nourished and in no acute distress.  HEENT: Normal. Neck: Supple, no JVD, carotid bruits, or masses noted.  Cardiac: Regular rate and rhythm. I do not appreciate a rub today. No edema.  Respiratory:  Lungs are clear to auscultation bilaterally with normal work of breathing.  GI: Soft and nontender.  MS: No deformity or atrophy. Gait and ROM intact. Skin: Warm and dry. Color is normal.  Neuro:  Strength and sensation are intact and no gross focal deficits noted.  Psych: Alert, appropriate and with normal affect.   LABORATORY DATA:  EKG:  EKG is not ordered today.   Lab Results  Component Value Date   WBC 10.3 03/16/2015   HGB 11.0* 03/16/2015   HCT 32.7* 03/16/2015   PLT 413* 03/16/2015   GLUCOSE 112* 03/16/2015   CHOL 168 04/02/2014   TRIG 119  04/02/2014   HDL 43 04/02/2014   LDLCALC 101* 04/02/2014   ALT 15 03/13/2015   AST 16 03/13/2015   NA 138 03/16/2015   K 3.5 03/16/2015   CL 102 03/16/2015   CREATININE 1.31* 03/16/2015   BUN 28* 03/16/2015   CO2 25 03/16/2015   TSH 5.231* 03/13/2015   INR 1.07 04/02/2014   HGBA1C 16.0* 03/14/2015    BNP (last 3 results)  Recent Labs  03/13/15 2033  BNP 132.0*    ProBNP (last 3  results) No results for input(s): PROBNP in the last 8760 hours.   Other Studies Reviewed Today:  CXR IMPRESSION FROM 04/02/15: Bibasilar atelectasis and/or infiltrates with small bilateral pleural effusions. Findings have progressed slightly from prior chest x-ray of 03/13/2015.   Electronically Signed  By: Marcello Moores Register  On: 04/02/2015 12:22  Initial Echo Study Conclusions from 03/14/2015  - Left ventricle: The cavity size was normal. There was mild concentric hypertrophy. Systolic function was normal. The estimated ejection fraction was in the range of 60% to 65%. Wall motion was normal; there were no regional wall motion abnormalities. Doppler parameters are consistent with abnormal left ventricular relaxation (grade 1 diastolic dysfunction). - Mitral valve: Calcified annulus. There was mild regurgitation. - Pericardium, extracardiac: A moderate pericardial effusion was identified circumferential to the heart. The fluid exhibited hematoma appearance around apex. There was mildright ventricular chamber collapse. There was mildright atrial chamber collapse. Features were consistent with mild tamponade physiology. There was a moderate-sized left pleural effusion.  Impressions:  - When compared to prior study, pericardial effusion is new.  Limited Echo Study Conclusions from 03/16/2015  - Left ventricle: The cavity size was normal. Wall thickness was normal. Systolic function was normal. The estimated ejection fraction was in the range of 60% to 65%.  Wall motion was normal; there were no regional wall motion abnormalities. - Pericardium, extracardiac: A moderate, free-flowing pericardial effusion was identified circumferential to the heart. There was no evidence of hemodynamic compromise.  Impressions:  - Compared to the 03/14/15, the size of the pericardial effusion is unchanged, but the signs of tamponade are no longer present. There is only intermittent and mild RA collapse.  Limited Echo Study Conclusions from 03/2015  - Left ventricle: The cavity size was normal. Systolic function was normal. The estimated ejection fraction was in the range of 55% to 60%. Wall motion was normal; there were no regional wall motion abnormalities. Left ventricular diastolic function parameters were normal. - Atrial septum: No defect or patent foramen ovale was identified.   Assessment/Plan: 1. Pericardial and pleural effusion - improved clinically - especially after treatment with Lasix and round of antibiotics. She will finish these in just a couple of days and then stop. Finish the NSAID after total course of 6 weeks. Finish colchicine after total course of 3 months. Will see back prn.   2. HTN - BP ok on current regimen.   3. Uncontrolled DM as reflected by her A1C  4. HLD - on statin - instructed to look for increased myalgias since on longer use of Colchicine.  Overall, she is improved.   Current medicines are reviewed with the patient today.  The patient does not have concerns regarding medicines other than what has been noted above.  The following changes have been made:  See above.  Labs/ tests ordered today include:    Orders Placed This Encounter  Procedures  . Basic metabolic panel     Disposition:   FU with Dr. Burt Knack prn.   Patient is agreeable to this plan and will call if any problems develop in the interim.   Signed: Burtis Junes, RN, ANP-C 04/08/2015 8:59 AM  [facility] [address] [place], [place]  [address] Phone: [phone] Fax: [phone]

## 2015-04-08 NOTE — Patient Instructions (Addendum)
We will be checking the following labs today - BMET   Medication Instructions:    Continue with your current medicines.   Finish your antibiotics and when finished - may stop the Lasix as well.   Total duration of Ibuprofen is for 6 weeks only  Total duration of Colchicine is 3 months only    Testing/Procedures To Be Arranged:  N/A  Follow-Up:   See [place] back prn.     Other Special Instructions:   N/A  Call the [place] office at [phone] if you have any questions, problems or concerns.

## 2015-04-09 ENCOUNTER — Other Ambulatory Visit: Payer: Self-pay | Admitting: *Deleted

## 2015-04-09 ENCOUNTER — Telehealth: Payer: Self-pay | Admitting: Nurse Practitioner

## 2015-04-09 DIAGNOSIS — N189 Chronic kidney disease, unspecified: Secondary | ICD-10-CM

## 2015-04-09 NOTE — Telephone Encounter (Signed)
Follow Up  ° °Pt returned the call  °

## 2015-04-11 NOTE — Telephone Encounter (Signed)
Pt stated not feeling any better. Is winded again, pain all across the front of chest and is moaning in her sleep.  Pt stated is having active chest pain. I stated if pt is having active chest pain pt needs to go to ER. Will send to Truitt Merle, NP to [facility].

## 2015-04-11 NOTE — Telephone Encounter (Signed)
Per pt call she does not feel any better. Pt would like a call back please.

## 2015-04-12 NOTE — Telephone Encounter (Signed)
Let's see how she is doing.  She may need to go and get another CXR.

## 2015-04-12 NOTE — Telephone Encounter (Signed)
Left message on machine for pt to contact the office.   

## 2015-04-23 ENCOUNTER — Other Ambulatory Visit: Payer: Self-pay

## 2015-05-10 ENCOUNTER — Other Ambulatory Visit (HOSPITAL_COMMUNITY): Payer: Self-pay | Admitting: Psychiatry

## 2015-05-24 ENCOUNTER — Other Ambulatory Visit (HOSPITAL_COMMUNITY): Payer: Self-pay | Admitting: Psychiatry

## 2015-05-27 ENCOUNTER — Ambulatory Visit (INDEPENDENT_AMBULATORY_CARE_PROVIDER_SITE_OTHER): Payer: Medicaid Other | Admitting: Psychiatry

## 2015-05-27 ENCOUNTER — Encounter (HOSPITAL_COMMUNITY): Payer: Self-pay | Admitting: Psychiatry

## 2015-05-27 VITALS — BP 137/78 | HR 97 | Ht 62.0 in | Wt 162.0 lb

## 2015-05-27 DIAGNOSIS — F4310 Post-traumatic stress disorder, unspecified: Secondary | ICD-10-CM | POA: Diagnosis not present

## 2015-05-27 DIAGNOSIS — F330 Major depressive disorder, recurrent, mild: Secondary | ICD-10-CM

## 2015-05-27 MED ORDER — DULOXETINE HCL 60 MG PO CPEP: 60.0000 mg | ORAL_CAPSULE | Freq: Every day | ORAL | Status: DC

## 2015-05-27 MED ORDER — CLONAZEPAM 0.5 MG PO TABS: 0.5000 mg | ORAL_TABLET | Freq: Every day | ORAL | Status: DC

## 2015-05-27 NOTE — Progress Notes (Signed)
[facility] Progress Note  Suzanne Allen LY:[id number] 54 y.o.  05/27/2015 10:47 AM  Chief Complaint:  Medication management and follow-up.    History of Present Illness: Suzanne Allen came for her appointment.  She is taking Klonopin 0.5 mg at bedtime and Cymbalta 60 mg daily.  She denies any major panic attack.  She is getting treatment for her eye and now she endorsed some improvement in her vision.  She is taking Lamictal for her neuropathy and she has seen improvement in her tingling and numbness.  She denies any side effects including any rash or itching.  She denies any irritability, anger, crying spells or any nightmares.  She denies any nightmares, flashback or any bad dreams.  Her sleep is good.  Her energy level is okay.  She denies any feeling of hopelessness or worthlessness.  Her appetite is okay.  Her vitals are stable.  Patient denies drinking or using any illegal substances.  She has no tremors, shakes or any EPS.  Suicidal Ideation: No Plan Formed: No Patient has means to carry out plan: No  Homicidal Ideation: No Plan Formed: No Patient has means to carry out plan: No  Review of Systems  Constitutional: Negative.   Eyes: Positive for redness.  Musculoskeletal: Negative.   Skin: Negative for itching and rash.  Neurological: Positive for tingling and sensory change. Negative for tremors and headaches.    Psychiatric: Agitation: No Hallucination: No Depressed Mood: No Insomnia: No Hypersomnia: No Altered Concentration: No Feels Worthless: No Grandiose Ideas: No Belief In Special Powers: No New/Increased Substance Abuse: No Compulsions: No  Neurologic: Headache: No Seizure: No Paresthesias: Diabetic neuropathy in both feet  Medical History:   Patient has history of hypothyroidism, insulin-dependent diabetes, diabetic neuropathy, hypertension macular degeneration.    Outpatient Encounter Prescriptions as of 05/27/2015  Medication Sig  .  clonazePAM (KLONOPIN) 0.5 MG tablet Take 1 tablet (0.5 mg total) by mouth at bedtime.  . colchicine 0.6 MG tablet Take 1 tablet (0.6 mg total) by mouth 2 (two) times daily.  . CRESTOR 40 MG tablet Take 1 tablet by mouth daily  . doxycycline (VIBRAMYCIN) 100 MG capsule Take 1 capsule (100 mg total) by mouth 2 (two) times daily.  . DULoxetine (CYMBALTA) 60 MG capsule Take 1 capsule (60 mg total) by mouth daily.  Marland Kitchen FOLIC ACID PO Take 1 tablet by mouth daily.  . furosemide (LASIX) 20 MG tablet Take 1 tablet (20 mg total) by mouth daily.  Marland Kitchen ibuprofen (ADVIL,MOTRIN) 600 MG tablet Take 1 tablet (600 mg total) by mouth 3 (three) times daily.  . insulin aspart (NOVOLOG) 100 UNIT/ML injection Inject 20 Units into the skin 3 (three) times daily before meals.   . insulin glargine (LANTUS) 100 UNIT/ML injection Inject 40 Units into the skin 2 (two) times daily.  Marland Kitchen lamoTRIgine (LAMICTAL) 100 MG tablet Take 1 tablet (100 mg total) by mouth daily.  Marland Kitchen levothyroxine (SYNTHROID, LEVOTHROID) 137 MCG tablet Take 137 mcg by mouth daily.    . [DISCONTINUED] clonazePAM (KLONOPIN) 0.5 MG tablet Take 1/2 tab in am and 1/2 at bed time  . [DISCONTINUED] DULoxetine (CYMBALTA) 60 MG capsule Take 1 capsule (60 mg total) by mouth daily.   No facility-administered encounter medications on file as of 05/27/2015.    Past Psychiatric History/Hospitalization(s): Patient has history of depression, anxiety and PTSD. Patient husband committed suicide in 1996. She witnessed when she saw his body hanging. Patient has taken in the past Effexor Prozac and Celexa.  Anxiety: Yes Bipolar Disorder: No Depression: Yes Mania: No Psychosis: No Schizophrenia: No Personality Disorder: No Hospitalization for psychiatric illness: No History of Electroconvulsive Shock Therapy: No Prior Suicide Attempts: No  Physical Exam: Constitutional:  BP 137/78 mmHg  Pulse 97  Ht 5\' 2"  (1.575 m)  Wt 162 lb (73.483 kg)  BMI 29.62 kg/m2  General  Appearance: Patient has difficulty walking.  She is wearing cast.   Musculoskeletal: Strength & Muscle Tone: Pain in her foot Gait & Station: Difficulty in walking due to pain Patient leans: N/A  Mental status examination: Patient is casually dressed and groomed.  She is pleasant, cooperative and maintained good eye contact.  She described her mood euthymic and her affect is appropriate.  Her speech is soft, clear and coherent.  Her thought processes logical and goal-directed.  There were no delusions, paranoia or any obsessive thoughts.  She denies any active or passive suicidal thoughts or homicidal thought.  Her fund of knowledge is good.  Her psychomotor activity is slow.  There were no flight of ideas or any loose association.  She denies any tremors or shakes.  She is alert and oriented 3.  Her insight judgment and impulse control is okay.  Established Problem, Stable/Improving (1), Review of Psycho-Social Stressors (1), Review of Last Therapy Session (1) and Review of Medication Regimen & Side Effects (2)  Assessment: Axis I:  Maj. depressive disorder, recurrent, PTSD.    Axis II:  Deferred  Axis III:  Patient Active Problem List   Diagnosis Date Noted  . CAP (community acquired pneumonia) 03/13/2015  . Dyspnea 03/13/2015  . Respiratory failure with hypoxia ([place]) 03/13/2015  . Pericardial effusion 03/13/2015  . Depression 02/15/2015  . Carpal tunnel syndrome 11/28/2014  . Diabetes, polyneuropathy ([place]) 11/15/2014  . Numbness 11/15/2014  . Gait disturbance 11/15/2014  . Hand weakness 11/15/2014  . Insomnia 11/15/2014  . DDD (degenerative disc disease), lumbar 04/10/2014  . Diarrhea 04/05/2014  . Dyslipidemia 04/04/2014  . 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 Fatty liver 04/04/2014  . Acute pulmonary edema ([place]) 04/04/2014  . Hypokalemia 04/04/2014  . Elevated LFTs 04/04/2014  . Benign essential HTN 04/02/2014  . Hypothyroidism 04/02/2014  .  Leukocytosis 10/04/2013  . Acute pancreatitis 10/04/2013  . TIA (transient ischemic attack) 09/23/2012  . Diabetic osteomyelitis ([place]) 08/29/2012  . Diabetes type 1, uncontrolled ([place]) 08/29/2012  . Anxiety 08/24/2011    Plan:  Patient is a stable on her current medication.  She like to continue Klonopin 0.5 mg at bedtime and Cymbalta 60 mg daily.  Discussed medication side effects and benefits.  She is getting Lamictal 100 mg prescribed from a different provider for neuropathy. Recommended to call us back if she has any question or any concern.  Follow-up in 3 months I offer counseling but patient declined.    ARFEEN,SYED T., MD 05/27/2015

## 2015-06-17 ENCOUNTER — Encounter: Payer: Self-pay | Admitting: Neurology

## 2015-06-17 ENCOUNTER — Ambulatory Visit (INDEPENDENT_AMBULATORY_CARE_PROVIDER_SITE_OTHER): Payer: Medicaid Other | Admitting: Neurology

## 2015-06-17 VITALS — BP 116/68 | HR 78 | Resp 18 | Ht 62.0 in | Wt 163.4 lb

## 2015-06-17 DIAGNOSIS — E042 Nontoxic multinodular goiter: Secondary | ICD-10-CM | POA: Insufficient documentation

## 2015-06-17 DIAGNOSIS — F32A Depression, unspecified: Secondary | ICD-10-CM

## 2015-06-17 DIAGNOSIS — E1142 Type 2 diabetes mellitus with diabetic polyneuropathy: Secondary | ICD-10-CM

## 2015-06-17 DIAGNOSIS — F329 Major depressive disorder, single episode, unspecified: Secondary | ICD-10-CM

## 2015-06-17 DIAGNOSIS — R269 Unspecified abnormalities of gait and mobility: Secondary | ICD-10-CM

## 2015-06-17 DIAGNOSIS — E890 Postprocedural hypothyroidism: Secondary | ICD-10-CM | POA: Insufficient documentation

## 2015-06-17 DIAGNOSIS — E113599 Type 2 diabetes mellitus with proliferative diabetic retinopathy without macular edema, unspecified eye: Secondary | ICD-10-CM | POA: Insufficient documentation

## 2015-06-17 DIAGNOSIS — I10 Essential (primary) hypertension: Secondary | ICD-10-CM | POA: Insufficient documentation

## 2015-06-17 DIAGNOSIS — R208 Other disturbances of skin sensation: Secondary | ICD-10-CM

## 2015-06-17 DIAGNOSIS — E11620 Type 2 diabetes mellitus with diabetic dermatitis: Secondary | ICD-10-CM | POA: Insufficient documentation

## 2015-06-17 DIAGNOSIS — G5603 Carpal tunnel syndrome, bilateral upper limbs: Secondary | ICD-10-CM | POA: Diagnosis not present

## 2015-06-17 MED ORDER — LAMOTRIGINE 150 MG PO TABS: 150.0000 mg | ORAL_TABLET | Freq: Every day | ORAL | Status: DC

## 2015-06-17 NOTE — Progress Notes (Signed)
[facility]  PATIENT: Suzanne Allen DOB: Oct 10, 1960  REFERRING DOCTOR OR PCP:  Gwenlyn Perking  SOURCE: Patient and records from primary care provider and EMR  _________________________________   HISTORICAL  CHIEF COMPLAINT:  Chief Complaint  Patient presents with  . Diabetic Polyneuropathy    Sts. numbness in bilat legs is improved since starting Lamictal.  Sts. numbness used to be to the knees in both legs, now is just in ankles and feet. Sts. she continues to take Cymbalta as well/fim  . Rib Fracture    Since last ov, she has fallen (fell into washing machine at home) and has right rib fx for which she is taking Hydrocodone/fim    HISTORY OF PRESENT ILLNESS:  Diabetic polyneuropathy and numbness:    Suzanne Allen is a 54 year old reports with numbness and diabetic polyneuropathy.    She was having pain from the knees to the feet but feels pain is much better since starting lamotrigine a few months ago.   Now pain is just below the ankles and is not as severe, though still troublesome.  She reports numbness up to the knees and mild weakness in her feet.     She has had IDDM since age 57.  Control is difficult and HgBA1C is frequently above 10.0.    Sugars are elvated most afternoons, often > 300.      CTS:   She was diagnosed with carpal tunnel earlier this year but she feels that pain has also improved and she denies any hand weakness.    Gait/balance:   She has some difficulty with gait and balance.   She notes that she is unable to stand unsupported with her eyes closed.   She has difficulty feeling her toes.  She fell while trying to replace a light and fractured her rib.  MRI of the brain 03/07/2015 was normal.  Mood:   She has depression and anxiety.   She feels her irritability and anger is better on lamotrigine.      DATA Results of NCV/EMG 11/28/14 :  "Nerve conduction studies done on all 4 extremities shows evidence of bilateral mild carpal tunnel syndrome.  Nerve conduction studies also revealed evidence of a primarily axonal peripheral neuropathy of moderate severity. EMG evaluation of the right lower extremity shows mild distal chronic denervation consistent with the diagnosis of peripheral neuropathy. There is no evidence of an overlying right lumbosacral radiculopathy."  REVIEW OF SYSTEMS: Constitutional: No fevers, chills, sweats, or change in appetite Eyes: She reports blurred vision and eye pain. Ear, nose and throat: No hearing loss, ear pain, nasal congestion, sore throat Cardiovascular: No chest pain, palpitations Respiratory: Occasional shortness of breath  with exertion.   Occasional wheezes GastrointestinaI: No nausea, vomiting, diarrhea, abdominal pain, fecal incontinence Genitourinary: No dysuria, urinary retention or frequency.  No nocturia. Musculoskeletal: She notes some pain in the joints and muscles. No neck pain, back pain Integumentary: No rash, pruritus, skin lesions Neurological: as above Psychiatric: Notes depression and anxiety.  Also feels she has much more apathy and fatigue. She notes insomnia Endocrine: she has IDDM Hematologic/Lymphatic: No anemia, purpura, petechiae. Allergic/Immunologic: No itchy/runny eyes, nasal congestion, recent allergic reactions, rashes  ALLERGIES: Allergies  Allergen Reactions  . Erythromycin Anaphylaxis  . Vancomycin Other (See Comments)    [place] Syndrome    HOME MEDICATIONS:  Current outpatient prescriptions:  .  clonazePAM (KLONOPIN) 0.5 MG tablet, Take 1 tablet (0.5 mg total) by mouth at bedtime., Disp: 30 tablet, Rfl:  2 .  CRESTOR 40 MG tablet, Take 1 tablet by mouth daily, Disp: , Rfl: 5 .  DULoxetine (CYMBALTA) 60 MG capsule, Take 1 capsule (60 mg total) by mouth daily., Disp: 30 capsule, Rfl: 2 .  FOLIC ACID PO, Take 1 tablet by mouth daily., Disp: , Rfl:  .  HYDROcodone-acetaminophen (NORCO/VICODIN) 5-325 MG tablet, Take 1 or 2 tablets PO Q6-8H PRN severe pain,  Disp: , Rfl:  .  ibuprofen (ADVIL,MOTRIN) 600 MG tablet, Take 1 tablet (600 mg total) by mouth 3 (three) times daily., Disp: 90 tablet, Rfl: 1 .  insulin aspart (NOVOLOG) 100 UNIT/ML injection, Inject 20 Units into the skin 3 (three) times daily before meals. , Disp: , Rfl:  .  insulin glargine (LANTUS) 100 UNIT/ML injection, Inject 40 Units into the skin 2 (two) times daily., Disp: , Rfl:  .  lamoTRIgine (LAMICTAL) 100 MG tablet, Take 1 tablet (100 mg total) by mouth daily., Disp: 60 tablet, Rfl: 5 .  levothyroxine (SYNTHROID, LEVOTHROID) 137 MCG tablet, Take 137 mcg by mouth daily.  , Disp: , Rfl:  .  doxycycline (VIBRAMYCIN) 100 MG capsule, Take 1 capsule (100 mg total) by mouth 2 (two) times daily. (Patient not taking: Reported on 06/17/2015), Disp: 14 capsule, Rfl: 0 .  furosemide (LASIX) 20 MG tablet, Take 1 tablet (20 mg total) by mouth daily. (Patient not taking: Reported on 06/17/2015), Disp: 10 tablet, Rfl: 0  PAST MEDICAL HISTORY: Past Medical History  Diagnosis Date  . HTN (hypertension)   . Macular degeneration, bilateral   . Hypercholesteremia   . Hypothyroidism   . Stroke [facility]) ? date & 09/22/2012  . Peripheral neuropathy ([place])   . Anxiety   . Depression   . PTSD (post-traumatic stress disorder)   . 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 Fatty liver 04/04/2014  . Carpal tunnel syndrome 11/28/2014    Bilateral  . CAP (community acquired pneumonia) 03/13/2015  . Pneumonia 1993  . Diabetes mellitus type I ([place]) dx'd 1977  . Diabetic osteomyelitis ([place])     right great toe/progress notes 09/23/2012  . Migraines 1980's    "when I was in my 20's" (03/14/2015)  . Chronic pancreatitis ([place])   . Diabetic retinopathy ([place])   . TIA (transient ischemic attack) 09/22/2012    [name] 10/25/2012    PAST SURGICAL HISTORY: Past Surgical History  Procedure Laterality Date  . Cesarean section  1989; 1995  . Tonsillectomy  1965  . Refractive surgery Bilateral 2013  .  Total thyroidectomy  2012  . Peripherally inserted central catheter insertion Right 08/31/2012  . I&d extremity Right multiple    "big toe"  . Toe amputation Right 11/2012    "big toe"  . Tubal ligation  1995  . Eye surgery Bilateral     "hemorrhaged behind my eye"  . Picc line removal ([facility] hx) Right 10/12/2012    [name] 10/12/2012    FAMILY HISTORY: Family History  Problem Relation Age of Onset  . Depression Mother   . Alcohol abuse Father   . Alcohol abuse Sister     SOCIAL HISTORY:  Social History   Social History  . Marital Status: Widowed    Spouse Name: N/A  . Number of Children: N/A  . Years of Education: N/A   Occupational History  . Not on file.   Social History Main Topics  . Smoking status: Never Smoker   . Smokeless tobacco: Never Used  . Alcohol Use: No  .  Drug Use: No  . Sexual Activity: Not Currently   Other Topics Concern  . Not on file   Social History Narrative     PHYSICAL EXAM  Filed Vitals:   06/17/15 1006  BP: 116/68  Pulse: 78  Resp: 18  Height: 5\' 2"  (1.575 m)  Weight: 163 lb 6.4 oz (74.118 kg)    Body mass index is 29.88 kg/(m^2).   General: The patient is well-developed and well-nourished and in no acute distress   Neurologic Exam  Mental status: The patient is alert and oriented x 3 at the time of the examination. The patient has apparent normal recent and remote memory,    Speech is normal.  Cranial nerves: Extraocular movements are full.   Facial symmetry is present. There is good facial sensation to soft touch bilaterally.Facial strength is normal.  Trapezius and sternocleidomastoid strength is normal. No dysarthria is noted.  The tongue is midline, and the patient has symmetric elevation of the soft palate. No obvious hearing deficits are noted.  Motor:  Muscle bulk is normal.   Tone is normal. Strength is  5 / 5 proximally in all 4 extremities. However she has 4+ over 5 intrinsic hand muscle strength bilaterally and 4/5  toe extension in the feet  Sensory: Sensory testing is intact to pinprick, soft touch sensation in her arms but decreased vibration in distal fingers. However, she has absent vibration sensation at the toes and ankle. She has near absent touch/temp sensation up to mid shin.    Coordination: Cerebellar testing reveals good finger-nose-finger and ok heel-to-shin bilaterally.  Gait and station: Station is normal.   Gait is normal but she needs to look down to do tandem gait is mildly wide. Romberg is positive..   Reflexes: Deep tendon reflexes are 1+ in the arms and absent in the legs.      DIAGNOSTIC DATA (LABS, IMAGING, TESTING) - I reviewed patient records, labs, notes, testing and imaging myself where available.  Lab Results  Component Value Date   WBC 10.3 03/16/2015   HGB 11.0* 03/16/2015   HCT 32.7* 03/16/2015   MCV 89.8 03/16/2015   PLT 413* 03/16/2015      Component Value Date/Time   NA 138 04/08/2015 0916   K 3.5 04/08/2015 0916   CL 100 04/08/2015 0916   CO2 27 04/08/2015 0916   GLUCOSE 191* 04/08/2015 0916   BUN 43* 04/08/2015 0916   CREATININE 1.12 04/08/2015 0916   CREATININE 0.80 09/14/2012 1344   CALCIUM 8.6 04/08/2015 0916   PROT 7.2 03/13/2015 1520   ALBUMIN 2.9* 03/13/2015 1520   AST 16 03/13/2015 1520   ALT 15 03/13/2015 1520   ALKPHOS 88 03/13/2015 1520   BILITOT 0.7 03/13/2015 1520   GFRNONAA 46* 03/16/2015 0314   GFRAA 53* 03/16/2015 0314   Lab Results  Component Value Date   CHOL 168 04/02/2014   HDL 43 04/02/2014   LDLCALC 101* 04/02/2014   TRIG 119 04/02/2014   CHOLHDL 3.9 04/02/2014   Lab Results  Component Value Date   HGBA1C 16.0* 03/14/2015   No results found for: VITAMINB12 Lab Results  Component Value Date   TSH 5.231* 03/13/2015       ASSESSMENT AND PLAN  Diabetic polyneuropathy associated with type 2 diabetes mellitus (HCC)  Bilateral carpal tunnel syndrome  Dysesthesia  Gait disturbance  Depression     1.   Increase lamotrigine from 100 mg by mouth twice a day to 150 mg by mouth twice a  day as it seems to be helping the dysesthetic pain as well as mood. 2.   Continue Cymbalta 60 mg daily. 3.  She will return to see me in 12 months or sooner if she has new or worsening neurologic symptoms.  Richard A. Felecia Shelling, MD, PhD [id number] AM Certified in Neurology, Clinical Neurophysiology, Sleep Medicine, Pain Medicine and Neuroimaging  [facility] [address] [place], [place] [address] [phone]

## 2015-06-17 NOTE — Patient Instructions (Signed)
We will increase your lamotrigine for 100 mg twice a day to 150 mg twice a day.  I will see you back in 1 year but if his symptoms get much worse, give [place] a call.

## 2015-07-08 ENCOUNTER — Emergency Department (HOSPITAL_COMMUNITY): Payer: Medicaid Other

## 2015-07-08 ENCOUNTER — Encounter (HOSPITAL_COMMUNITY): Payer: Self-pay | Admitting: *Deleted

## 2015-07-08 ENCOUNTER — Inpatient Hospital Stay (HOSPITAL_COMMUNITY)
Admission: EM | Admit: 2015-07-08 | Discharge: 2015-07-13 | DRG: 854 | Disposition: A | Discharge status: Home / Self Care | Payer: Medicaid Other | Attending: Internal Medicine | Admitting: Internal Medicine

## 2015-07-08 DIAGNOSIS — F418 Other specified anxiety disorders: Secondary | ICD-10-CM | POA: Diagnosis present

## 2015-07-08 DIAGNOSIS — E1065 Type 1 diabetes mellitus with hyperglycemia: Secondary | ICD-10-CM | POA: Diagnosis present

## 2015-07-08 DIAGNOSIS — N179 Acute kidney failure, unspecified: Secondary | ICD-10-CM | POA: Diagnosis present

## 2015-07-08 DIAGNOSIS — N183 Chronic kidney disease, stage 3 (moderate): Secondary | ICD-10-CM | POA: Diagnosis present

## 2015-07-08 DIAGNOSIS — F4310 Post-traumatic stress disorder, unspecified: Secondary | ICD-10-CM | POA: Diagnosis present

## 2015-07-08 DIAGNOSIS — E1169 Type 2 diabetes mellitus with other specified complication: Secondary | ICD-10-CM

## 2015-07-08 DIAGNOSIS — Z8673 Personal history of transient ischemic attack (TIA), and cerebral infarction without residual deficits: Secondary | ICD-10-CM

## 2015-07-08 DIAGNOSIS — Z794 Long term (current) use of insulin: Secondary | ICD-10-CM | POA: Diagnosis not present

## 2015-07-08 DIAGNOSIS — Z89411 Acquired absence of right great toe: Secondary | ICD-10-CM | POA: Diagnosis not present

## 2015-07-08 DIAGNOSIS — E872 Acidosis, unspecified: Secondary | ICD-10-CM

## 2015-07-08 DIAGNOSIS — Z881 Allergy status to other antibiotic agents status: Secondary | ICD-10-CM

## 2015-07-08 DIAGNOSIS — D649 Anemia, unspecified: Secondary | ICD-10-CM | POA: Diagnosis not present

## 2015-07-08 DIAGNOSIS — E10621 Type 1 diabetes mellitus with foot ulcer: Secondary | ICD-10-CM | POA: Diagnosis present

## 2015-07-08 DIAGNOSIS — E1069 Type 1 diabetes mellitus with other specified complication: Secondary | ICD-10-CM | POA: Diagnosis present

## 2015-07-08 DIAGNOSIS — M869 Osteomyelitis, unspecified: Secondary | ICD-10-CM

## 2015-07-08 DIAGNOSIS — M86671 Other chronic osteomyelitis, right ankle and foot: Secondary | ICD-10-CM | POA: Diagnosis present

## 2015-07-08 DIAGNOSIS — E1165 Type 2 diabetes mellitus with hyperglycemia: Secondary | ICD-10-CM

## 2015-07-08 DIAGNOSIS — E1022 Type 1 diabetes mellitus with diabetic chronic kidney disease: Secondary | ICD-10-CM | POA: Diagnosis present

## 2015-07-08 DIAGNOSIS — I129 Hypertensive chronic kidney disease with stage 1 through stage 4 chronic kidney disease, or unspecified chronic kidney disease: Secondary | ICD-10-CM | POA: Diagnosis present

## 2015-07-08 DIAGNOSIS — E1040 Type 1 diabetes mellitus with diabetic neuropathy, unspecified: Secondary | ICD-10-CM | POA: Diagnosis present

## 2015-07-08 DIAGNOSIS — A419 Sepsis, unspecified organism: Principal | ICD-10-CM | POA: Diagnosis present

## 2015-07-08 DIAGNOSIS — E039 Hypothyroidism, unspecified: Secondary | ICD-10-CM

## 2015-07-08 DIAGNOSIS — L97519 Non-pressure chronic ulcer of other part of right foot with unspecified severity: Secondary | ICD-10-CM | POA: Diagnosis present

## 2015-07-08 DIAGNOSIS — M79671 Pain in right foot: Secondary | ICD-10-CM | POA: Diagnosis present

## 2015-07-08 DIAGNOSIS — Z89419 Acquired absence of unspecified great toe: Secondary | ICD-10-CM | POA: Diagnosis not present

## 2015-07-08 DIAGNOSIS — E785 Hyperlipidemia, unspecified: Secondary | ICD-10-CM

## 2015-07-08 LAB — CBC
HCT: 38.7 % (ref 36.0–46.0)
Hemoglobin: 13 g/dL (ref 12.0–15.0)
MCH: 29.5 pg (ref 26.0–34.0)
MCHC: 33.6 g/dL (ref 30.0–36.0)
MCV: 87.8 fL (ref 78.0–100.0)
Platelets: 400 10*3/uL (ref 150–400)
RBC: 4.41 MIL/uL (ref 3.87–5.11)
RDW: 14.1 % (ref 11.5–15.5)
WBC: 15.5 10*3/uL — ABNORMAL HIGH (ref 4.0–10.5)

## 2015-07-08 LAB — I-STAT CG4 LACTIC ACID, ED
Lactic Acid, Venous: 2.97 mmol/L (ref 0.5–2.0)
Lactic Acid, Venous: 1.58 mmol/L (ref 0.5–2.0)

## 2015-07-08 LAB — BASIC METABOLIC PANEL
Anion gap: 13 (ref 5–15)
BUN: 26 mg/dL — ABNORMAL HIGH (ref 6–20)
CO2: 22 mmol/L (ref 22–32)
Calcium: 9.3 mg/dL (ref 8.9–10.3)
Chloride: 102 mmol/L (ref 101–111)
Creatinine, Ser: 1.18 mg/dL — ABNORMAL HIGH (ref 0.44–1.00)
GFR calc Af Amer: 60 mL/min — ABNORMAL LOW (ref >60)
GFR calc non Af Amer: 52 mL/min — ABNORMAL LOW (ref >60)
Glucose, Bld: 254 mg/dL — ABNORMAL HIGH (ref 65–99)
Potassium: 4.5 mmol/L (ref 3.5–5.1)
Sodium: 137 mmol/L (ref 135–145)

## 2015-07-08 LAB — GLUCOSE, CAPILLARY: Glucose-Capillary: 337 mg/dL — ABNORMAL HIGH (ref 65–99)

## 2015-07-08 LAB — PREALBUMIN: Prealbumin: 20.5 mg/dL (ref 18–38)

## 2015-07-08 LAB — C-REACTIVE PROTEIN: CRP: 5.9 mg/dL — ABNORMAL HIGH (ref <1.0)

## 2015-07-08 LAB — SEDIMENTATION RATE: Sed Rate: 97 mm/hr — ABNORMAL HIGH (ref 0–22)

## 2015-07-08 MED ORDER — ONDANSETRON HCL 4 MG PO TABS: 4.0000 mg | ORAL_TABLET | Freq: Four times a day (QID) | ORAL | Status: DC | PRN

## 2015-07-08 MED ORDER — LAMOTRIGINE 100 MG PO TABS
150.0000 mg | ORAL_TABLET | Freq: Every day | ORAL | Status: DC
  Administered 2015-07-09 – 2015-07-13 (×5): 150 mg via ORAL
  Filled 2015-07-08 (×7): qty 2

## 2015-07-08 MED ORDER — METRONIDAZOLE IN NACL 5-0.79 MG/ML-% IV SOLN
500.0000 mg | Freq: Three times a day (TID) | INTRAVENOUS | Status: DC
  Administered 2015-07-08 – 2015-07-13 (×15): 500 mg via INTRAVENOUS
  Filled 2015-07-08 (×17): qty 100

## 2015-07-08 MED ORDER — DULOXETINE HCL 60 MG PO CPEP
60.0000 mg | ORAL_CAPSULE | Freq: Every day | ORAL | Status: DC
  Administered 2015-07-09 – 2015-07-13 (×5): 60 mg via ORAL
  Filled 2015-07-08 (×6): qty 1

## 2015-07-08 MED ORDER — HEPARIN SODIUM (PORCINE) 5000 UNIT/ML IJ SOLN
5000.0000 [IU] | Freq: Three times a day (TID) | INTRAMUSCULAR | Status: DC
  Administered 2015-07-08 – 2015-07-13 (×13): 5000 [IU] via SUBCUTANEOUS
  Filled 2015-07-08 (×13): qty 1

## 2015-07-08 MED ORDER — SODIUM CHLORIDE 0.9 % IV SOLN: INTRAVENOUS | Status: AC

## 2015-07-08 MED ORDER — VANCOMYCIN HCL 10 G IV SOLR
1250.0000 mg | Freq: Once | INTRAVENOUS | Status: DC
  Filled 2015-07-08: qty 1250

## 2015-07-08 MED ORDER — CLONAZEPAM 0.5 MG PO TABS
0.5000 mg | ORAL_TABLET | Freq: Every day | ORAL | Status: DC
  Administered 2015-07-08 – 2015-07-12 (×5): 0.5 mg via ORAL
  Filled 2015-07-08 (×5): qty 1

## 2015-07-08 MED ORDER — ROSUVASTATIN CALCIUM 10 MG PO TABS
40.0000 mg | ORAL_TABLET | Freq: Every day | ORAL | Status: DC
  Administered 2015-07-09 – 2015-07-12 (×4): 40 mg via ORAL
  Filled 2015-07-08 (×6): qty 4

## 2015-07-08 MED ORDER — DEXTROSE 5 % IV SOLN
2.0000 g | Freq: Two times a day (BID) | INTRAVENOUS | Status: DC
  Administered 2015-07-08 – 2015-07-12 (×8): 2 g via INTRAVENOUS
  Filled 2015-07-08 (×14): qty 2

## 2015-07-08 MED ORDER — VANCOMYCIN HCL IN DEXTROSE 750-5 MG/150ML-% IV SOLN
750.0000 mg | Freq: Two times a day (BID) | INTRAVENOUS | Status: DC
  Filled 2015-07-08: qty 150

## 2015-07-08 MED ORDER — SODIUM CHLORIDE 0.9 % IV BOLUS (SEPSIS)
1000.0000 mL | Freq: Once | INTRAVENOUS | Status: AC
  Administered 2015-07-08: 1000 mL via INTRAVENOUS

## 2015-07-08 MED ORDER — INSULIN ASPART 100 UNIT/ML ~~LOC~~ SOLN
0.0000 [IU] | Freq: Three times a day (TID) | SUBCUTANEOUS | Status: DC
  Administered 2015-07-09: 4 [IU] via SUBCUTANEOUS
  Administered 2015-07-09: 11 [IU] via SUBCUTANEOUS
  Administered 2015-07-09: 4 [IU] via SUBCUTANEOUS

## 2015-07-08 MED ORDER — SODIUM CHLORIDE 0.45 % IV SOLN
INTRAVENOUS | Status: DC
  Administered 2015-07-08: 19:00:00 via INTRAVENOUS

## 2015-07-08 MED ORDER — ONDANSETRON HCL 4 MG/2ML IJ SOLN
4.0000 mg | Freq: Four times a day (QID) | INTRAMUSCULAR | Status: DC | PRN
  Administered 2015-07-12: 4 mg via INTRAVENOUS
  Filled 2015-07-08: qty 2

## 2015-07-08 MED ORDER — INSULIN GLARGINE 100 UNIT/ML ~~LOC~~ SOLN
40.0000 [IU] | Freq: Two times a day (BID) | SUBCUTANEOUS | Status: DC
  Administered 2015-07-08 – 2015-07-13 (×8): 40 [IU] via SUBCUTANEOUS
  Filled 2015-07-08 (×11): qty 0.4

## 2015-07-08 MED ORDER — LEVOTHYROXINE SODIUM 25 MCG PO TABS
137.0000 ug | ORAL_TABLET | Freq: Every day | ORAL | Status: DC
  Administered 2015-07-09 – 2015-07-13 (×5): 137 ug via ORAL
  Filled 2015-07-08 (×6): qty 1

## 2015-07-08 MED ORDER — INSULIN ASPART 100 UNIT/ML ~~LOC~~ SOLN
0.0000 [IU] | Freq: Every day | SUBCUTANEOUS | Status: DC
  Administered 2015-07-08: 4 [IU] via SUBCUTANEOUS

## 2015-07-08 MED ORDER — DOXYCYCLINE HYCLATE 100 MG PO TABS
100.0000 mg | ORAL_TABLET | Freq: Two times a day (BID) | ORAL | Status: DC
  Administered 2015-07-08 – 2015-07-13 (×10): 100 mg via ORAL
  Filled 2015-07-08 (×10): qty 1

## 2015-07-08 NOTE — ED Provider Notes (Signed)
CSN: [id number]     Arrival date & time 07/08/15  1125 History   First MD Initiated Contact with Patient 07/08/15 1346     Chief Complaint  Patient presents with  . Leg Pain   HPI  Suzanne Allen is a 54 year old female with PMHx of HTN, CVA and DM presenting with toe and leg pain. She reports noticing the third toe of the right foot was blackened today. She is unsure when the last time her toe appeared normal. She reports decreased sensation in her feet bilaterally at baseline. She states that she is feeling a burning sensation up her entire right leg to the groin. She denies weakness or difficulty ambulating on the leg. Her toe pain is exacerbated by ambulation. She does not have regular foot exams but does state that she "lotions my feet regularly so I probably would've seen it ". She reports checking her blood glucose 4 times a day and states they are typically in the mid-100s. she reports compliance with her insulin regimen. Denies fevers, chills, dizziness, headache, syncope, abdominal pain, nausea, vomiting or diarrhea.   Past Medical History  Diagnosis Date  . HTN (hypertension)   . Macular degeneration, bilateral   . Hypercholesteremia   . Hypothyroidism   . Stroke [facility]) ? date & 09/22/2012  . Peripheral neuropathy ([place])   . Anxiety   . Depression   . PTSD (post-traumatic stress disorder)   . 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 Fatty liver 04/04/2014  . Carpal tunnel syndrome 11/28/2014    Bilateral  . CAP (community acquired pneumonia) 03/13/2015  . Pneumonia 1993  . Diabetes mellitus type I ([place]) dx'd 1977  . Diabetic osteomyelitis ([place])     right great toe/progress notes 09/23/2012  . Migraines 1980's    "when I was in my 20's" (03/14/2015)  . Chronic pancreatitis ([place])   . Diabetic retinopathy ([place])   . TIA (transient ischemic attack) 09/22/2012    [name] 10/25/2012   Past Surgical History  Procedure Laterality Date  . Cesarean section  1989; 1995  .  Tonsillectomy  1965  . Refractive surgery Bilateral 2013  . Total thyroidectomy  2012  . Peripherally inserted central catheter insertion Right 08/31/2012  . I&d extremity Right multiple    "big toe"  . Toe amputation Right 11/2012    "big toe"  . Tubal ligation  1995  . Eye surgery Bilateral     "hemorrhaged behind my eye"  . Picc line removal ([facility] hx) Right 10/12/2012    [name] 10/12/2012   Family History  Problem Relation Age of Onset  . Depression Mother   . Alcohol abuse Father   . Alcohol abuse Sister    Social History  Substance Use Topics  . Smoking status: Never Smoker   . Smokeless tobacco: Never Used  . Alcohol Use: No   OB History    No data available     Review of Systems  Musculoskeletal: Positive for myalgias.  Skin: Positive for color change.  All other systems reviewed and are negative.     Allergies  Erythromycin and Vancomycin  Home Medications   Prior to Admission medications   Medication Sig Start Date End Date Taking? Authorizing Provider  clonazePAM (KLONOPIN) 0.5 MG tablet Take 1 tablet (0.5 mg total) by mouth at bedtime. 05/27/15  Yes [name], MD  CRESTOR 40 MG tablet Take 1 tablet by mouth daily 11/06/14  Yes Historical Provider, MD  DULoxetine (  CYMBALTA) 60 MG capsule Take 1 capsule (60 mg total) by mouth daily. 05/27/15  Yes [name], MD  folic acid (FOLVITE) 1 MG tablet Take 1 mg by mouth daily.   Yes Historical Provider, MD  insulin aspart (NOVOLOG) 100 UNIT/ML injection Inject 20 Units into the skin 3 (three) times daily before meals.    Yes Historical Provider, MD  insulin glargine (LANTUS) 100 UNIT/ML injection Inject 40 Units into the skin 2 (two) times daily.   Yes Historical Provider, MD  lamoTRIgine (LAMICTAL) 150 MG tablet Take 1 tablet (150 mg total) by mouth daily. 06/17/15  Yes Britt Bottom, MD  levothyroxine (SYNTHROID, LEVOTHROID) 137 MCG tablet Take 137 mcg by mouth daily.     Yes Historical Provider, MD    lisinopril-hydrochlorothiazide (PRINZIDE,ZESTORETIC) 20-25 MG tablet Take 1 tablet by mouth every morning. 03/12/15  Yes Historical Provider, MD  doxycycline (VIBRAMYCIN) 100 MG capsule Take 1 capsule (100 mg total) by mouth 2 (two) times daily. Patient not taking: Reported on 06/17/2015 04/02/15   Burtis Junes, NP  furosemide (LASIX) 20 MG tablet Take 1 tablet (20 mg total) by mouth daily. Patient not taking: Reported on 06/17/2015 04/02/15   Burtis Junes, NP  ibuprofen (ADVIL,MOTRIN) 600 MG tablet Take 1 tablet (600 mg total) by mouth 3 (three) times daily. Patient not taking: Reported on 07/08/2015 03/25/15   Burtis Junes, NP   BP 116/64 mmHg  Pulse 80  Temp(Src) 97.9 F (36.6 C) (Oral)  Resp 18  Ht 5\' 2"  (1.575 m)  Wt 70.308 kg  BMI 28.34 kg/m2  SpO2 98% Physical Exam  Constitutional: She appears well-developed and well-nourished. No distress.  HENT:  Head: Normocephalic and atraumatic.  Eyes: Conjunctivae are normal. Right eye exhibits no discharge. Left eye exhibits no discharge. No scleral icterus.  Neck: Normal range of motion.  Cardiovascular: Normal rate, regular rhythm and intact distal pulses.   Pulmonary/Chest: Effort normal. No respiratory distress.  Musculoskeletal: Normal range of motion.       Feet:  Great toe amputation of the right foot. Third digit of right foot with ulceration of the pad of the toe. Erythema surrounding the wound without purulent discharge. Distal tip of the toe appears dark and and possibly necrotic. Faint erythema over the mid foot with ascending lymphangitis to mid tibia. Full range of motion of the ankles and knee. Patient retains full range of motion of the other toes but cannot move her third digit. Pedal pulse is palpable. Decreased sensation over the bilateral feet.  Neurological: She is alert. Coordination normal.  Skin: Skin is warm and dry.  Psychiatric: She has a normal mood and affect. Her behavior is normal.  Nursing note and  vitals reviewed.   ED Course  Procedures (including critical care time) Labs Review Labs Reviewed  CBC - Abnormal; Notable for the following:    WBC 15.5 (*)    All other components within normal limits  BASIC METABOLIC PANEL - Abnormal; Notable for the following:    Glucose, Bld 254 (*)    BUN 26 (*)    Creatinine, Ser 1.18 (*)    GFR calc non Af Amer 52 (*)    GFR calc Af Amer 60 (*)    All other components within normal limits  HEMOGLOBIN A1C - Abnormal; Notable for the following:    Hgb A1c MFr Bld 10.9 (*)    All other components within normal limits  SEDIMENTATION RATE - Abnormal; Notable for the  following:    Sed Rate 97 (*)    All other components within normal limits  C-REACTIVE PROTEIN - Abnormal; Notable for the following:    CRP 5.9 (*)    All other components within normal limits  CBC - Abnormal; Notable for the following:    WBC 11.4 (*)    RBC 3.63 (*)    Hemoglobin 10.3 (*)    HCT 31.6 (*)    All other components within normal limits  BASIC METABOLIC PANEL - Abnormal; Notable for the following:    Glucose, Bld 183 (*)    BUN 24 (*)    Creatinine, Ser 1.06 (*)    Calcium 8.4 (*)    GFR calc non Af Amer 59 (*)    All other components within normal limits  APTT - Abnormal; Notable for the following:    aPTT 39 (*)    All other components within normal limits  GLUCOSE, CAPILLARY - Abnormal; Notable for the following:    Glucose-Capillary 337 (*)    All other components within normal limits  GLUCOSE, CAPILLARY - Abnormal; Notable for the following:    Glucose-Capillary 167 (*)    All other components within normal limits  GLUCOSE, CAPILLARY - Abnormal; Notable for the following:    Glucose-Capillary 153 (*)    All other components within normal limits  I-STAT CG4 LACTIC ACID, ED - Abnormal; Notable for the following:    Lactic Acid, Venous 2.97 (*)    All other components within normal limits  CULTURE, BLOOD (ROUTINE X 2)  CULTURE, BLOOD (ROUTINE X 2)   HIV ANTIBODY (ROUTINE TESTING)  PREALBUMIN  PROTIME-INR  VITAMIN D 25 HYDROXY (VIT D DEFICIENCY, FRACTURES)  I-STAT CG4 LACTIC ACID, ED    Imaging Review Dg Foot Complete Right  07/08/2015  CLINICAL DATA:  Foot would add diabetic patient. History of prior amputation. Initial encounter. EXAM: RIGHT FOOT COMPLETE - 3+ VIEW COMPARISON:  Plain films the right great toe 04/04/2013. FINDINGS: The patient is status post amputation at the level of the mid first metatarsal. Bony destructive change is seen in the head of the second metatarsal with sclerosis throughout the diaphysis of the second metatarsal. Soft tissues of the foot are swollen. Calcaneal spurring is noted. No fracture dislocation. IMPRESSION: Findings most consistent with chronic osteomyelitis throughout the diaphysis and head of the second metatarsal. Status post amputation at the level of the mid first metatarsal. Diffuse soft tissue swelling. Electronically Signed   By: Inge Rise M.D.   On: 07/08/2015 13:10   I have personally reviewed and evaluated these images and lab results as part of my medical decision-making.   EKG Interpretation None      MDM   Final diagnoses:  Diabetic osteomyelitis ([place])   54 year old female with PMHx of diabetes presenting with pain and darkening of the skin of the 3rd right toe. She is s/p great toe amputation. VSS. Pt is nontoxic appearing. Toe of the right foot with significant skin breakdown and darkening of the skin. Faint cellulitis with streaking up the anterior tibia. Lactic acid 2.97. WBC 15.5. DG foot shows chronic osteomyelitis of head and diaphysis of 2nd metatarsal with diffuse soft tissue swelling. IV fluids started in ED. Consulted orthopedics who recommended hospitalization and starting on broad spectrum abx. Pt has allergy to vancomycin. Consulted pharmacy who recommends continuing with vancomycin with slow infusion and benadryl for symptom control. Pt refusing vancomycin.  Consulted hospitalist (Dr. Marily Memos) for admission for osteo and  likely surgery.     Josephina Gip, PA-C 07/09/15 [place], MD 07/10/15 [phone]

## 2015-07-08 NOTE — Progress Notes (Addendum)
ANTIBIOTIC CONSULT NOTE - INITIAL  Pharmacy Consult for vancomycin Indication: Osteo  Allergies  Allergen Reactions  . Erythromycin Anaphylaxis  . Vancomycin Other (See Comments)    Red Man Syndrome    Patient Measurements: Height: 5\' 2"  (157.5 cm) Weight: 155 lb (70.308 kg) IBW/kg (Calculated) : 50.1   Vital Signs: Temp: 98.5 F (36.9 C) (12/26 1345) Temp Source: Oral (12/26 1345) BP: 142/67 mmHg (12/26 1425) Pulse Rate: 94 (12/26 1425) Intake/Output from previous day:   Intake/Output from this shift:    Labs:  Recent Labs  07/08/15 1257  WBC 15.5*  HGB 13.0  PLT 400  CREATININE 1.18*   Estimated Creatinine Clearance: 50.7 mL/min (by C-G formula based on Cr of 1.18). No results for input(s): VANCOTROUGH, VANCOPEAK, VANCORANDOM, GENTTROUGH, GENTPEAK, GENTRANDOM, TOBRATROUGH, TOBRAPEAK, TOBRARND, AMIKACINPEAK, AMIKACINTROU, AMIKACIN in the last 72 hours.   Microbiology: No results found for this or any previous visit (from the past 720 hour(s)).  Medical History: Past Medical History  Diagnosis Date  . HTN (hypertension)   . Macular degeneration, bilateral   . Hypercholesteremia   . Hypothyroidism   . Stroke [facility]) ? date & 09/22/2012  . Peripheral neuropathy ([place])   . Anxiety   . Depression   . PTSD (post-traumatic stress disorder)   . 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 Fatty liver 04/04/2014  . Carpal tunnel syndrome 11/28/2014    Bilateral  . CAP (community acquired pneumonia) 03/13/2015  . Pneumonia 1993  . Diabetes mellitus type I ([place]) dx'd 1977  . Diabetic osteomyelitis ([place])     right great toe/progress notes 09/23/2012  . Migraines 1980's    "when I was in my 20's" (03/14/2015)  . Chronic pancreatitis ([place])   . Diabetic retinopathy ([place])   . TIA (transient ischemic attack) 09/22/2012    [name] 10/25/2012    Assessment: 54 yo female with a block toe on her right leg and allso noted with pain and redness. Pharmacy has been  consulted to dose vancomycin for concern of osteomyelitis. -WBC= 15.5, afebrile, SCr= 1.18 and CrCl ~ 50  12/26 vanc>> 12/26 blood x2  Goal of Therapy:  Vancomycin trough level 15-20 mcg/ml  Plan:  -Vancomycin 1250mg  IV x1 followed by 750mg  IV q12h -Will infuse each dose over 2 hours as patient noted with a history of red man syndrome -Will follow renal function, cultures and clinical progress  Hildred Laser, Pharm D 07/08/2015 3:55 PM

## 2015-07-08 NOTE — Consult Note (Addendum)
Orthopaedic Trauma Service Consultation  Reason for Consult: Right foot ulcer and osteomyelitis Referring Physician: Orlie Dakin, MD  Suzanne Allen is an 54 y.o. female.  HPI: 18 months ago underwent first ray amputation at [facility] in [place]. Reports excellent glucose control with readings in the 100's on QID sticks but HgbA1c of 16, suggesting routine levels in 300--400's.  Currently burning pain in the right leg and odor of 2nd two which did turn black yesterday and was darkening over the course of three days but has improved since IV administration down stairs in ED.  Denies foot pain,  Ambulatory.  No previous interventions.  Seeing a neurologist and taking Lamictal for neuralgia.  Past Medical History  Diagnosis Date  . HTN (hypertension)   . Macular degeneration, bilateral   . Hypercholesteremia   . Hypothyroidism   . Stroke [facility]) ? date & 09/22/2012  . Peripheral neuropathy ([place])   . Anxiety   . Depression   . PTSD (post-traumatic stress disorder)   . 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 Fatty liver 04/04/2014  . Carpal tunnel syndrome 11/28/2014    Bilateral  . CAP (community acquired pneumonia) 03/13/2015  . Pneumonia 1993  . Diabetes mellitus type I ([place]) dx'd 1977  . Diabetic osteomyelitis ([place])     right great toe/progress notes 09/23/2012  . Migraines 1980's    "when I was in my 20's" (03/14/2015)  . Chronic pancreatitis ([place])   . Diabetic retinopathy ([place])   . TIA (transient ischemic attack) 09/22/2012    [name] 10/25/2012    Past Surgical History  Procedure Laterality Date  . Cesarean section  1989; 1995  . Tonsillectomy  1965  . Refractive surgery Bilateral 2013  . Total thyroidectomy  2012  . Peripherally inserted central catheter insertion Right 08/31/2012  . I&d extremity Right multiple    "big toe"  . Toe amputation Right 11/2012    "big toe"  . Tubal ligation  1995  . Eye surgery Bilateral     "hemorrhaged behind my eye"  . Picc line  removal ([facility] hx) Right 10/12/2012    [name] 10/12/2012    Family History  Problem Relation Age of Onset  . Depression Mother   . Alcohol abuse Father   . Alcohol abuse Sister     Social History:  reports that she has never smoked. She has never used smokeless tobacco. She reports that she does not drink alcohol or use illicit drugs.  Allergies:  Allergies  Allergen Reactions  . Erythromycin Anaphylaxis  . Vancomycin Other (See Comments)    Red Man Syndrome    Medications: I have reviewed the patient's current medications.  Results for orders placed or performed during the hospital encounter of 07/08/15 (from the past 48 hour(s))  I-Stat CG4 Lactic Acid, ED     Status: Abnormal   Collection Time: 07/08/15 12:45 PM  Result Value Ref Range   Lactic Acid, Venous 2.97 (HH) 0.5 - 2.0 mmol/L   Comment NOTIFIED PHYSICIAN   CBC     Status: Abnormal   Collection Time: 07/08/15 12:57 PM  Result Value Ref Range   WBC 15.5 (H) 4.0 - 10.5 K/uL   RBC 4.41 3.87 - 5.11 MIL/uL   Hemoglobin 13.0 12.0 - 15.0 g/dL   HCT 38.7 36.0 - 46.0 %   MCV 87.8 78.0 - 100.0 fL   MCH 29.5 26.0 - 34.0 pg   MCHC 33.6 30.0 - 36.0 g/dL   RDW 14.1 11.5 -  15.5 %   Platelets 400 150 - 400 K/uL  Basic metabolic panel     Status: Abnormal   Collection Time: 07/08/15 12:57 PM  Result Value Ref Range   Sodium 137 135 - 145 mmol/L   Potassium 4.5 3.5 - 5.1 mmol/L   Chloride 102 101 - 111 mmol/L   CO2 22 22 - 32 mmol/L   Glucose, Bld 254 (H) 65 - 99 mg/dL   BUN 26 (H) 6 - 20 mg/dL   Creatinine, Ser 1.18 (H) 0.44 - 1.00 mg/dL   Calcium 9.3 8.9 - 10.3 mg/dL   GFR calc non Af Amer 52 (L) >60 mL/min   GFR calc Af Amer 60 (L) >60 mL/min    Comment: (NOTE) The eGFR has been calculated using the CKD EPI equation. This calculation has not been validated in all clinical situations. eGFR's persistently <60 mL/min signify possible Chronic Kidney Disease.    Anion gap 13 5 - 15  I-Stat CG4 Lactic Acid, ED     Status:  None   Collection Time: 07/08/15  3:50 PM  Result Value Ref Range   Lactic Acid, Venous 1.58 0.5 - 2.0 mmol/L    Dg Foot Complete Right  07/08/2015  CLINICAL DATA:  Foot would add diabetic patient. History of prior amputation. Initial encounter. EXAM: RIGHT FOOT COMPLETE - 3+ VIEW COMPARISON:  Plain films the right great toe 04/04/2013. FINDINGS: The patient is status post amputation at the level of the mid first metatarsal. Bony destructive change is seen in the head of the second metatarsal with sclerosis throughout the diaphysis of the second metatarsal. Soft tissues of the foot are swollen. Calcaneal spurring is noted. No fracture dislocation. IMPRESSION: Findings most consistent with chronic osteomyelitis throughout the diaphysis and head of the second metatarsal. Status post amputation at the level of the mid first metatarsal. Diffuse soft tissue swelling. Electronically Signed   By: Inge Rise M.D.   On: 07/08/2015 13:10    ROS Denies recent fever, bleeding abnormalities, urologic dysfunction, GI problems, or weight gain.  Blood pressure 122/69, pulse 87, temperature 98.3 F (36.8 C), temperature source Oral, resp. rate 15, height '5\' 2"'  (1.575 m), weight 155 lb (70.308 kg), SpO2 98 %. 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  No ascending erythema and none of the forefoot whatsoever beyond MCP junction  Nontender  Sens insensate  Motor ext, flex toes grossly  DP palp  PROCEDURE:  USING A SIZE 15 STAINLESS STEEL SCALPEL FROM [employer], I PERFORMED A SHARP EXCISIONAL DEBRIDEMENT OF SKIN, SUBCUTANEOUS TISSUE, AND MUSCLE TENDON DOWN TO, BUT NOT INCLUDING, THE DISTAL PHALANX BONE OF SECOND TOE  FINDINGS: NO PURULENCE, scant bleeding    Assessment/Plan: 1. Continue IV abx for local soft tissue control 2. Will likely end up  with BKA given poor sugar control, which I discussed with her 3. ABIs ordered 4. Have d/w Dr. Sharol Given who will discuss options more thoroughly and assume management on his return--namely, feasibility of more distal amputation if soft tissues sufficiently respond to abx.  Clinical appearance is more encouraging than history and xrays.   Altamese Oxford, MD [facility] [phone] (p)   07/08/2015  6:00 PM

## 2015-07-08 NOTE — ED Notes (Signed)
Doctor notified of lactic acid results

## 2015-07-08 NOTE — ED Notes (Signed)
Spoke with phlebtomy who will obtain second set of blood cultures.

## 2015-07-08 NOTE — ED Notes (Signed)
EDP at bedside  

## 2015-07-08 NOTE — ED Provider Notes (Signed)
Patient complains of right leg and foot pain onset 3 days ago. She noticed discoloration of her second toe of her right foot this morning. No other associated symptoms Pain is mild to moderate at present. No treatment prior to coming here. On exam patient is alert and nontoxic right lower extremity second toe swollen, reddened with open wound which appears somewhat necrotic there is a red streak along the dorsum of the foot and up the sh distal two thirds of the shin area and thighs nontender. There are no inguinal nodes present  Orlie Dakin, MD 07/08/15 1712

## 2015-07-08 NOTE — Progress Notes (Signed)
ANTIBIOTIC CONSULT NOTE - INITIAL  Pharmacy Consult for Cefepime Indication: diabetic foot infection  Allergies  Allergen Reactions  . Erythromycin Anaphylaxis  . Vancomycin Other (See Comments)    Red Man Syndrome    Patient Measurements: Height: 5\' 2"  (157.5 cm) Weight: 155 lb (70.308 kg) IBW/kg (Calculated) : 50.1  Vital Signs: Temp: 98.3 F (36.8 C) (12/26 1718) Temp Source: Oral (12/26 1718) BP: 122/69 mmHg (12/26 1718) Pulse Rate: 87 (12/26 1718)  Labs:  Recent Labs  07/08/15 1257  WBC 15.5*  HGB 13.0  PLT 400  CREATININE 1.18*   Microbiology: 12/26 BCx: px   Antiinfectives  12/26 Cefepime >> 12/26 Doxycycline >> 12/26 Flagyl >>   Assessment: 54 yo female admitted 12/26 osteomyelitis of R foot/toe in setting of DM.   Goal of Therapy: treatment of infection  Plan:  1. Begin Cefepime 2 grams IV q 12 hours 2. Await pending micro data and evaluation by ortho 3. Vancomycin allergy; if the need for MRSA coverage is felt to be needed can pre treat with IV diphenhydramine and lengthen administration time to decreases chances the "red man syndrome" occurs  Vincenza Hews, PharmD, BCPS 07/08/2015, 6:14 PM Pager: [phone]

## 2015-07-08 NOTE — ED Notes (Signed)
Spoke with provider regarding blood cultures and allergy to vancomycin. Provider spoke with pharmacy stated to administer slowly and give benadryl if develop itching.

## 2015-07-08 NOTE — H&P (Addendum)
[facility] History and Physical  Railee Kesecker [id number] DOB: 02/14/1961 DOA: 07/08/2015  Referring physician: PA Lahoma Crocker - [facility] PCP: Barrie Lyme, FNP   Chief Complaint: Toe pain  HPI: Suzanne Allen is a 54 y.o. female  Right third toe swollen, painful, purplish black. Started 2 days ago. Patient states that prior to this toe is normal. Problem is constant. Getting worse. Has not tried anything for her symptoms to make them better. Worse with ambulation. Now with foul odor. Denies any fevers, rash, nausea, vomiting, chills, fatigue, decreased appetite. Some radiation of the pain up her leg.  States that she is compliant with her insulin regimen which includes NovoLog 20 units 4 times a day and Lantus 40 units twice a day. States that her blood sugar levels are typically in the mid 100 range.      Review of Systems:  Constitutional:  No weight loss, night sweats, Fevers, chills, fatigue.  HEENT:  No headaches, Difficulty swallowing,Tooth/dental problems,Sore throat, Cardio-vascular:  No chest pain, Orthopnea, PND, swelling in lower extremities, anasarca, dizziness, palpitations  GI:  No heartburn, indigestion, abdominal pain, nausea, vomiting, diarrhea, change in bowel habits, loss of appetite  Resp:   No shortness of breath with exertion or at rest. No excess mucus, no productive cough, No non-productive cough, No coughing up of blood.No change in color of mucus.No wheezing.No chest wall deformity  Skin:  no rash or lesions.  GU:  no dysuria, change in color of urine, no urgency or frequency. No flank pain.  Musculoskeletal:  Per HPI Psych:  No change in mood or affect. No depression or anxiety. No memory loss.  Neuro:  No change in sensation, unilateral strength, or cognitive abilities  All other systems were reviewed and are negative.  Past Medical History  Diagnosis Date  . HTN (hypertension)   . Macular degeneration, bilateral   . Hypercholesteremia   .  Hypothyroidism   . Stroke [facility]) ? date & 09/22/2012  . Peripheral neuropathy ([place])   . Anxiety   . Depression   . PTSD (post-traumatic stress disorder)   . 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 Fatty liver 04/04/2014  . Carpal tunnel syndrome 11/28/2014    Bilateral  . CAP (community acquired pneumonia) 03/13/2015  . Pneumonia 1993  . Diabetes mellitus type I ([place]) dx'd 1977  . Diabetic osteomyelitis ([place])     right great toe/progress notes 09/23/2012  . Migraines 1980's    "when I was in my 20's" (03/14/2015)  . Chronic pancreatitis ([place])   . Diabetic retinopathy ([place])   . TIA (transient ischemic attack) 09/22/2012    [name] 10/25/2012   Past Surgical History  Procedure Laterality Date  . Cesarean section  1989; 1995  . Tonsillectomy  1965  . Refractive surgery Bilateral 2013  . Total thyroidectomy  2012  . Peripherally inserted central catheter insertion Right 08/31/2012  . I&d extremity Right multiple    "big toe"  . Toe amputation Right 11/2012    "big toe"  . Tubal ligation  1995  . Eye surgery Bilateral     "hemorrhaged behind my eye"  . Picc line removal ([facility] hx) Right 10/12/2012    [name] 10/12/2012   Social History:  reports that she has never smoked. She has never used smokeless tobacco. She reports that she does not drink alcohol or use illicit drugs.  Allergies  Allergen Reactions  . Erythromycin Anaphylaxis  . Vancomycin Other (See Comments)    [place]  Syndrome    Family History  Problem Relation Age of Onset  . Depression Mother   . Alcohol abuse Father   . Alcohol abuse Sister      Prior to Admission medications   Medication Sig Start Date End Date Taking? Authorizing Provider  clonazePAM (KLONOPIN) 0.5 MG tablet Take 1 tablet (0.5 mg total) by mouth at bedtime. 05/27/15  Yes [name], MD  CRESTOR 40 MG tablet Take 1 tablet by mouth daily 11/06/14  Yes Historical Provider, MD  DULoxetine (CYMBALTA) 60 MG capsule Take 1 capsule (60  mg total) by mouth daily. 05/27/15  Yes [name], MD  folic acid (FOLVITE) 1 MG tablet Take 1 mg by mouth daily.   Yes Historical Provider, MD  insulin aspart (NOVOLOG) 100 UNIT/ML injection Inject 20 Units into the skin 3 (three) times daily before meals.    Yes Historical Provider, MD  insulin glargine (LANTUS) 100 UNIT/ML injection Inject 40 Units into the skin 2 (two) times daily.   Yes Historical Provider, MD  lamoTRIgine (LAMICTAL) 150 MG tablet Take 1 tablet (150 mg total) by mouth daily. 06/17/15  Yes Britt Bottom, MD  levothyroxine (SYNTHROID, LEVOTHROID) 137 MCG tablet Take 137 mcg by mouth daily.     Yes Historical Provider, MD  doxycycline (VIBRAMYCIN) 100 MG capsule Take 1 capsule (100 mg total) by mouth 2 (two) times daily. Patient not taking: Reported on 06/17/2015 04/02/15   Burtis Junes, NP  furosemide (LASIX) 20 MG tablet Take 1 tablet (20 mg total) by mouth daily. Patient not taking: Reported on 06/17/2015 04/02/15   Burtis Junes, NP  ibuprofen (ADVIL,MOTRIN) 600 MG tablet Take 1 tablet (600 mg total) by mouth 3 (three) times daily. Patient not taking: Reported on 07/08/2015 03/25/15   Burtis Junes, NP   Physical Exam: Filed Vitals:   07/08/15 1500 07/08/15 1530 07/08/15 1600 07/08/15 1718  BP: 132/77 132/76 135/83 122/69  Pulse: 88 87 88 87  Temp:    98.3 F (36.8 C)  TempSrc:    Oral  Resp:    15  Height:      Weight:      SpO2: 100% 100% 100% 98%    Wt Readings from Last 3 Encounters:  07/08/15 70.308 kg (155 lb)  06/17/15 74.118 kg (163 lb 6.4 oz)  05/27/15 73.483 kg (162 lb)    General:  Appears calm and comfortable Eyes:  PERRL, EOMI, normal lids, iris ENT:  grossly normal hearing, lips & tongue Neck:  no LAD, masses or thyromegaly Cardiovascular:  RRR, no m/r/g. No LE edema.  Respiratory:  CTA bilaterally, no w/r/r. Normal respiratory effort. Abdomen:  soft, ntnd Skin: Significant skin breakdown on the third toe on the right foot with  complete maceration and destruction of the epidermis and dermis at the toe tip on the plantar surface. Significant induration, erythema, and tenderness to palpation of the digit extending to the distal portion of the dorsum of the foot. Foul odor present. Musculoskeletal: As per above otherwise grossly normal tone BUE/BLE Psychiatric:  grossly normal mood and affect, speech fluent and appropriate Neurologic:  CN 2-12 grossly intact, moves all extremities in coordinated fashion.          Labs on Admission:  Basic Metabolic Panel:  Recent Labs Lab 07/08/15 1257  NA 137  K 4.5  CL 102  CO2 22  GLUCOSE 254*  BUN 26*  CREATININE 1.18*  CALCIUM 9.3   Liver Function Tests: No  results for input(s): AST, ALT, ALKPHOS, BILITOT, PROT, ALBUMIN in the last 168 hours. No results for input(s): LIPASE, AMYLASE in the last 168 hours. No results for input(s): AMMONIA in the last 168 hours. CBC:  Recent Labs Lab 07/08/15 1257  WBC 15.5*  HGB 13.0  HCT 38.7  MCV 87.8  PLT 400   Cardiac Enzymes: No results for input(s): CKTOTAL, CKMB, CKMBINDEX, TROPONINI in the last 168 hours.  BNP (last 3 results)  Recent Labs  03/13/15 2033  BNP 132.0*    ProBNP (last 3 results) No results for input(s): PROBNP in the last 8760 hours.   CREATININE: 1.18 mg/dL ABNORMAL (07/08/15 1257) Estimated creatinine clearance - 50.7 mL/min  CBG: No results for input(s): GLUCAP in the last 168 hours.  Radiological Exams on Admission: Dg Foot Complete Right  07/08/2015  CLINICAL DATA:  Foot would add diabetic patient. History of prior amputation. Initial encounter. EXAM: RIGHT FOOT COMPLETE - 3+ VIEW COMPARISON:  Plain films the right great toe 04/04/2013. FINDINGS: The patient is status post amputation at the level of the mid first metatarsal. Bony destructive change is seen in the head of the second metatarsal with sclerosis throughout the diaphysis of the second metatarsal. Soft tissues of the foot are  swollen. Calcaneal spurring is noted. No fracture dislocation. IMPRESSION: Findings most consistent with chronic osteomyelitis throughout the diaphysis and head of the second metatarsal. Status post amputation at the level of the mid first metatarsal. Diffuse soft tissue swelling. Electronically Signed   By: Inge Rise M.D.   On: 07/08/2015 13:10     Assessment/Plan Principal Problem:   Diabetic osteomyelitis ([place]) Active Problems:   Hypothyroidism   HLD (hyperlipidemia)   Osteomyelitis ([place])   Poorly controlled diabetes mellitus ([place])   Depression with anxiety   Lactic acid acidosis  Osteomyelitis: 3rd R toe. Previously w/ 1st toe amputation on same foot. Suspect digit threatening infection. 2+ dosalis pedis pulses. Ortho consulted by EDP and to see pt. Lactic acid 2.97 improved to 1.5 after IVF. WBC 15. AFVSS.  - [place] - f/u Ortho recs - start cefepime and Metro per DM foot ulcer order set. (Vanc and erythro allergy) - Consider ABI - wound consult - Trend lactic acid.   DM: Very poorly controlled. Pt claims her home glucose is around 150 or less. States she checks her glucose QID. Discussed A1c value of 16 and how this correlates w/ glucose in 3-400+ range. Encouraged pt to get another meter. - A1c - continue lantus 40 BID - SSI - resistant  HLD: - continue crestor  Hypothyroid: - cointineu synthroid  Depression/Anxiety: - continue clindamycin, klonopin, lamictal     Code Status: FULL  DVT Prophylaxis: Hep Family Communication: none Disposition Plan: Pending Improvement    MERRELL, DAVID Lenna Sciara, MD Family Medicine [facility] [email or web address] Password [email or web address]

## 2015-07-08 NOTE — ED Notes (Addendum)
Pt states that she has a black toe on her rt leg. Pt reports having diabetes and having a toe amputated on the same foot as well. Pt states that she has pain in her leg as well. Pt noted to have a sore on the toe and reddness extending up her leg. Pulses present.

## 2015-07-08 NOTE — ED Notes (Signed)
Admitting at bedside 

## 2015-07-09 ENCOUNTER — Ambulatory Visit (HOSPITAL_COMMUNITY): Payer: Medicaid Other

## 2015-07-09 DIAGNOSIS — E872 Acidosis: Secondary | ICD-10-CM

## 2015-07-09 DIAGNOSIS — M869 Osteomyelitis, unspecified: Secondary | ICD-10-CM

## 2015-07-09 DIAGNOSIS — D649 Anemia, unspecified: Secondary | ICD-10-CM

## 2015-07-09 DIAGNOSIS — A419 Sepsis, unspecified organism: Principal | ICD-10-CM

## 2015-07-09 DIAGNOSIS — E1169 Type 2 diabetes mellitus with other specified complication: Secondary | ICD-10-CM

## 2015-07-09 DIAGNOSIS — N183 Chronic kidney disease, stage 3 (moderate): Secondary | ICD-10-CM

## 2015-07-09 LAB — HIV ANTIBODY (ROUTINE TESTING W REFLEX): HIV Screen 4th Generation wRfx: NONREACTIVE

## 2015-07-09 LAB — CBC
HCT: 31.6 % — ABNORMAL LOW (ref 36.0–46.0)
Hemoglobin: 10.3 g/dL — ABNORMAL LOW (ref 12.0–15.0)
MCH: 28.4 pg (ref 26.0–34.0)
MCHC: 32.6 g/dL (ref 30.0–36.0)
MCV: 87.1 fL (ref 78.0–100.0)
Platelets: 383 10*3/uL (ref 150–400)
RBC: 3.63 MIL/uL — ABNORMAL LOW (ref 3.87–5.11)
RDW: 13.9 % (ref 11.5–15.5)
WBC: 11.4 10*3/uL — ABNORMAL HIGH (ref 4.0–10.5)

## 2015-07-09 LAB — BASIC METABOLIC PANEL
Anion gap: 8 (ref 5–15)
BUN: 24 mg/dL — ABNORMAL HIGH (ref 6–20)
CO2: 23 mmol/L (ref 22–32)
Calcium: 8.4 mg/dL — ABNORMAL LOW (ref 8.9–10.3)
Chloride: 107 mmol/L (ref 101–111)
Creatinine, Ser: 1.06 mg/dL — ABNORMAL HIGH (ref 0.44–1.00)
GFR calc Af Amer: >60 mL/min (ref >60)
GFR calc non Af Amer: 59 mL/min — ABNORMAL LOW (ref >60)
Glucose, Bld: 183 mg/dL — ABNORMAL HIGH (ref 65–99)
Potassium: 3.7 mmol/L (ref 3.5–5.1)
Sodium: 138 mmol/L (ref 135–145)

## 2015-07-09 LAB — APTT: aPTT: 39 seconds — ABNORMAL HIGH (ref 24–37)

## 2015-07-09 LAB — GLUCOSE, CAPILLARY
Glucose-Capillary: 167 mg/dL — ABNORMAL HIGH (ref 65–99)
Glucose-Capillary: 153 mg/dL — ABNORMAL HIGH (ref 65–99)
Glucose-Capillary: 271 mg/dL — ABNORMAL HIGH (ref 65–99)
Glucose-Capillary: 161 mg/dL — ABNORMAL HIGH (ref 65–99)

## 2015-07-09 LAB — HEMOGLOBIN A1C
Hgb A1c MFr Bld: 10.9 % — ABNORMAL HIGH (ref 4.8–5.6)
Mean Plasma Glucose: 266 mg/dL

## 2015-07-09 LAB — PROTIME-INR
INR: 1.15 (ref 0.00–1.49)
Prothrombin Time: 14.9 seconds (ref 11.6–15.2)

## 2015-07-09 MED ORDER — PRO-STAT SUGAR FREE PO LIQD
30.0000 mL | Freq: Every day | ORAL | Status: DC
  Administered 2015-07-09 – 2015-07-13 (×3): 30 mL via ORAL
  Filled 2015-07-09 (×4): qty 30

## 2015-07-09 NOTE — Progress Notes (Signed)
[facility]                                                                              Patient Demographics  Suzanne Allen, is a 54 y.o. female, DOB - 05-31-1961, [id number]  Admit date - 07/08/2015   Admitting Physician Waldemar Dickens, MD  Outpatient Primary MD for the patient is Barrie Lyme, FNP  LOS - 1   Chief Complaint  Patient presents with  . Leg Pain      HPI on 07/08/2015 by Dr. Linna Darner [name] is a 54 y.o. female  Right third toe swollen, painful, purplish black. Started 2 days ago. Patient states that prior to this toe is normal. Problem is constant. Getting worse. Has not tried anything for her symptoms to make them better. Worse with ambulation. Now with foul odor. Denies any fevers, rash, nausea, vomiting, chills, fatigue, decreased appetite. Some radiation of the pain up her leg.  States that she is compliant with her insulin regimen which includes NovoLog 20 units 4 times a day and Lantus 40 units twice a day. States that her blood sugar levels are typically in the mid 100 range.   Assessment & Plan   Sepsis secondary to Osteomyelitis of the right third toe -Patient did have one toe amputation on the same foot -Likely secondary to uncontrolled diabetes -Orthopedic consultation appreciated -Lactic acid was 2.97 upon admission however 1.5 after fluids -Patient also had leukocytosis with mild tachycardia -Right foot x-ray showed findings consistent with chronic osteomyelitis throughout the diaphysis and the head of the second metatarsal -Continue Flagyl, cefepime, doxycycline, patient has allergy to vancomycin -Pending further results from orthopedics -ABI of the right 1.2, left 1.1.  Left TBI borderline, abnormal.  Uncontrolled Diabetes mellitus, type II -Continue Lantus, insulin sliding scale and CBG monitoring -Hemoglobin A1c 10.9  Hyperlipidemia -Continue statin  Hypothyroidism -Continue  Synthroid  Depression/anxiety -Continue Cymbalta, Lamictal, Klonopin  Normocytic anemia -Hemoglobin 11-12, currently 10.3 -Suspect drop in hemoglobin due to dilutional component  CKD, stage III -Creatinine currently stable, continue to monitor BMP  Code Status: Full  Family Communication: None at bedside  Disposition Plan: Admitted. Pending further recommendations from orthopedics  Time Spent in minutes   30 minutes  Procedures  ABI  Consults   Orthopedics  DVT Prophylaxis  heparin  Lab Results  Component Value Date   PLT 383 07/09/2015    Medications  Scheduled Meds: . ceFEPime (MAXIPIME) IV  2 g Intravenous Q12H  . clonazePAM  0.5 mg Oral QHS  . doxycycline  100 mg Oral Q12H  . DULoxetine  60 mg Oral Daily  . feeding supplement (PRO-STAT SUGAR FREE 64)  30 mL Oral Daily  . heparin  5,000 Units Subcutaneous 3 times per day  . insulin aspart  0-20 Units Subcutaneous TID WC  . insulin aspart  0-5 Units Subcutaneous QHS  . insulin glargine  40 Units Subcutaneous BID  . lamoTRIgine  150 mg Oral Daily  . levothyroxine  137 mcg Oral QAC breakfast  . metronidazole  500 mg Intravenous Q8H  . rosuvastatin  40 mg Oral q1800   Continuous Infusions: . sodium chloride 75 mL/hr at 07/08/15 [id number]  PRN Meds:.ondansetron **OR** ondansetron (ZOFRAN) IV  Antibiotics    Anti-infectives    Start     Dose/Rate Route Frequency Ordered Stop   07/09/15 0500  vancomycin (VANCOCIN) IVPB 750 mg/150 ml premix  Status:  Discontinued     750 mg 75 mL/hr over 120 Minutes Intravenous Every 12 hours 07/08/15 1559 07/08/15 1741   07/08/15 2000  doxycycline (VIBRA-TABS) tablet 100 mg     100 mg Oral Every 12 hours 07/08/15 1741     07/08/15 1900  metroNIDAZOLE (FLAGYL) IVPB 500 mg     500 mg 100 mL/hr over 60 Minutes Intravenous Every 8 hours 07/08/15 1741     07/08/15 1830  ceFEPIme (MAXIPIME) 2 g in dextrose 5 % 50 mL IVPB     2 g 100 mL/hr over 30 Minutes Intravenous Every 12  hours 07/08/15 1801     07/08/15 1500  vancomycin (VANCOCIN) 1,250 mg in sodium chloride 0.9 % 250 mL IVPB  Status:  Discontinued     1,250 mg 125 mL/hr over 120 Minutes Intravenous  Once 07/08/15 1457 07/08/15 1741      Subjective:   Suzanne Allen seen and examined today.  Patient has not explained. Is concerned about having another patient. Does agree that her diabetes is controlled. Denies any chest pain, shortness breath, abdominal pain, nausea or vomiting.   Objective:   Filed Vitals:   07/08/15 2059 07/08/15 2121 07/09/15 0610 07/09/15 1245  BP: 107/40 128/64 116/64 137/68  Pulse: 88 87 80 95  Temp: 98.8 F (37.1 C)  97.9 F (36.6 C) 98.1 F (36.7 C)  TempSrc: Oral  Oral   Resp: 17  18 18   Height:      Weight:      SpO2: 100%  98% 100%    Wt Readings from Last 3 Encounters:  07/08/15 70.308 kg (155 lb)  06/17/15 74.118 kg (163 lb 6.4 oz)  05/27/15 73.483 kg (162 lb)     Intake/Output Summary (Last 24 hours) at 07/09/15 1405 Last data filed at 07/09/15 0300  Gross per 24 hour  Intake    290 ml  Output    700 ml  Net   -410 ml    Exam  General: Well developed, well nourished, NAD, appears stated age  50: NCAT,mucous membranes moist.   Cardiovascular: S1 S2 auscultated, no rubs, murmurs or gallops. Regular rate and rhythm.  Respiratory: Clear to auscultation bilaterally with equal chest rise  Abdomen: Soft, nontender, nondistended, + bowel sounds  Extremities: warm dry without cyanosis clubbing or edema, TTP RLE  Neuro: AAOx3, nonfocal  Skin: Right foot currently wrapped however TTP  Psych: Normal affect and demeanor   Data Review   Micro Results No results found for this or any previous visit (from the past 240 hour(s)).  Radiology Reports Dg Foot Complete Right  07/08/2015  CLINICAL DATA:  Foot would add diabetic patient. History of prior amputation. Initial encounter. EXAM: RIGHT FOOT COMPLETE - 3+ VIEW COMPARISON:  Plain films the right  great toe 04/04/2013. FINDINGS: The patient is status post amputation at the level of the mid first metatarsal. Bony destructive change is seen in the head of the second metatarsal with sclerosis throughout the diaphysis of the second metatarsal. Soft tissues of the foot are swollen. Calcaneal spurring is noted. No fracture dislocation. IMPRESSION: Findings most consistent with chronic osteomyelitis throughout the diaphysis and head of the second metatarsal. Status post amputation at the level of the mid first metatarsal. Diffuse soft  tissue swelling. Electronically Signed   By: Inge Rise M.D.   On: 07/08/2015 13:10    CBC  Recent Labs Lab 07/08/15 1257 07/09/15 0535  WBC 15.5* 11.4*  HGB 13.0 10.3*  HCT 38.7 31.6*  PLT 400 383  MCV 87.8 87.1  MCH 29.5 28.4  MCHC 33.6 32.6  RDW 14.1 13.9    Chemistries   Recent Labs Lab 07/08/15 1257 07/09/15 0535  NA 137 138  K 4.5 3.7  CL 102 107  CO2 22 23  GLUCOSE 254* 183*  BUN 26* 24*  CREATININE 1.18* 1.06*  CALCIUM 9.3 8.4*   ------------------------------------------------------------------------------------------------------------------ estimated creatinine clearance is 56.4 mL/min (by C-G formula based on Cr of 1.06). ------------------------------------------------------------------------------------------------------------------  Recent Labs  07/08/15 1844  HGBA1C 10.9*   ------------------------------------------------------------------------------------------------------------------ No results for input(s): CHOL, HDL, LDLCALC, TRIG, CHOLHDL, LDLDIRECT in the last 72 hours. ------------------------------------------------------------------------------------------------------------------ No results for input(s): TSH, T4TOTAL, T3FREE, THYROIDAB in the last 72 hours.  Invalid input(s): FREET3 ------------------------------------------------------------------------------------------------------------------ No results  for input(s): VITAMINB12, FOLATE, FERRITIN, TIBC, IRON, RETICCTPCT in the last 72 hours.  Coagulation profile  Recent Labs Lab 07/09/15 0535  INR 1.15    No results for input(s): DDIMER in the last 72 hours.  Cardiac Enzymes No results for input(s): CKMB, TROPONINI, MYOGLOBIN in the last 168 hours.  Invalid input(s): CK ------------------------------------------------------------------------------------------------------------------ Invalid input(s): POCBNP    Suzanne Allen D.O. on 07/09/2015 at 2:05 PM  Between 7am to 7pm - Pager - [phone]  After 7pm go to [email or web address] - password [email or web address]  And look for the night coverage person covering for me after hours  [facility] Office  [phone]

## 2015-07-09 NOTE — Progress Notes (Signed)
Utilization review completed.  L. J. Lamoyne Hessel RN, BSN, CM 

## 2015-07-09 NOTE — Consult Note (Addendum)
WOC consult requested prior to ortho service involvement. Pt has osteomyelitis according to X-ray; this complex medical condition is beyond the [facility] [place] scope of practice. Ortho team is now following for assessment and plan of care for foot wound.  Please refer to their team for further questions. Please re-consult if further assistance is needed.  Thank-you,  Julien Girt MSN, [place], [place], [place], [place]

## 2015-07-09 NOTE — Progress Notes (Signed)
VASCULAR LAB PRELIMINARY  PRELIMINARY  PRELIMINARY  PRELIMINARY  VASCULAR LAB PRELIMINARY  ARTERIAL  ABI completed: Bilateral ABI's are normal at rest. Left TBI suggestive borderline - abnormal    RIGHT    LEFT    PRESSURE WAVEFORM  PRESSURE WAVEFORM  BRACHIAL 129 triphasic BRACHIAL 133 triphasic  DP 160 triphasic DP 147 triphasic  PT 167 triphasic PT 149 triphasic  GREAT TOE n/a NA GREAT TOE 0.64 NA    RIGHT LEFT  ABI 1.2 1.1    Suzanne Allen, RVT, RDMS 07/09/2015, 9:31 AM

## 2015-07-09 NOTE — Progress Notes (Signed)
Initial Nutrition Assessment  DOCUMENTATION CODES:   Not applicable  INTERVENTION:  Provide 30 ml Prostat po once daily, each supplement provides 100 kcal and 15 grams of protein.   Provide nourishment snacks. (Ordered).  Encourage adequate PO intake   NUTRITION DIAGNOSIS:   Increased nutrient needs related to wound healing as evidenced by estimated needs.  GOAL:   Patient will meet greater than or equal to 90% of their needs  MONITOR:   PO intake, Supplement acceptance, Skin, Weight trends, Labs, I & O's  REASON FOR ASSESSMENT:   Consult Wound healing  ASSESSMENT:  54 y.o. female 18 months ago underwent first ray amputation at [facility] [place] Reports excellent glucose control with readings in the 100's on QID sticks but HgbA1c of 16, suggesting routine levels in 300--400's. Currently burning pain in the right leg and odor of 2nd two which did turn black and was darkening over the course of three days but has improved since IV administration down stairs in ED.  Pt reports appetite is fine currently and PTA with consumption of at least 3 meals a day. No percent meal completion recorded, however pt reports good intake. Pt reports weight has been stable with usual body weight of 155 lbs. Pt is agreeable to nutritional supplements to aid in wound healing. Pt does report not liking "milky" drinks. RD to order Prostat and nourishment snacks.   Pt with no observed significant fat or muscle mass loss.   Labs and medications reviewed.   Diet Order:  Diet Carb Modified Fluid consistency:: Thin; Room service appropriate?: Yes  Skin:  Wound (see comment) (Diabetic ulcer on R foot 2nd and 3rd toe)  Last BM:  12/26  Height:   Ht Readings from Last 1 Encounters:  07/08/15 5\' 2"  (1.575 m)    Weight:   Wt Readings from Last 1 Encounters:  07/08/15 155 lb (70.308 kg)    Ideal Body Weight:  50 kg  BMI:  Body mass index is 28.34 kg/(m^2).  Estimated Nutritional Needs:    Kcal:  1800-2000  Protein:  85-95 grams  Fluid:  1.8 - 2 L/day  EDUCATION NEEDS:   No education needs identified at this time  Corrin Parker, MS, RD, LDN Pager # [phone] After hours/ weekend pager # [phone]

## 2015-07-09 NOTE — Progress Notes (Signed)
Inpatient Diabetes Program Recommendations  AACE/ADA: New Consensus Statement on Inpatient Glycemic Control (2015)  Target Ranges:  Prepandial:   less than 140 mg/dL      Peak postprandial:   less than 180 mg/dL (1-2 hours)      Critically ill patients:  140 - 180 mg/dL   Review of Glycemic Control  Results for EDELL, MESENBRINK (MRN [id number]) as of 07/09/2015 15:51  Ref. Range 07/08/2015 21:06 07/09/2015 06:08 07/09/2015 11:34  Glucose-Capillary Latest Ref Range: 65-99 mg/dL 337 (H) 167 (H) 153 (H)  Results for TOMEKA, KANTNER (MRN [id number]) as of 07/09/2015 15:51  Ref. Range 07/09/2015 05:35  Sodium Latest Ref Range: 135-145 mmol/L 138  Potassium Latest Ref Range: 3.5-5.1 mmol/L 3.7  Chloride Latest Ref Range: 101-111 mmol/L 107  CO2 Latest Ref Range: 22-32 mmol/L 23  BUN Latest Ref Range: 6-20 mg/dL 24 (H)  Creatinine Latest Ref Range: 0.44-1.00 mg/dL 1.06 (H)  Calcium Latest Ref Range: 8.9-10.3 mg/dL 8.4 (L)  EGFR (Non-African Amer.) Latest Ref Range: >60 mL/min 59 (L)  EGFR (African American) Latest Ref Range: >60 mL/min >60  Glucose Latest Ref Range: 65-99 mg/dL 183 (H)  Anion gap Latest Ref Range: 5-15  8  Results for Dobesh, NAILAH (MRN [id number]) as of 07/09/2015 15:51  Ref. Range 03/14/2015 03:28 07/08/2015 18:44  Hemoglobin A1C Latest Ref Range: 4.8-5.6 % 16.0 (H) 10.9 (H)    Inpatient Diabetes Program Recommendations:    Home DM Meds: Lantus 40 units bid  Humalog 20-30 units tid  Current DM Orders: Lantus 40 units bid  Novolog resistant SSI (0-20 units) TID AC + HS   -Glucose levels much improved today after initiation of Lantus and Novolog.  -Current A1c 10.9% - poor glucose control at home, although improved from 16% on 03/14/2015.  -Spoke to patient about her current A1c of 16%. Explained what an A1c is and what it measures. Reminded patient that her goal A1c is 7% or less per ADA  standards to prevent both acute and long-term complications. Explained to patient the extreme importance of good glucose control at home. Patient told me her glucose levels are always elevated when she gets sick. Patient stated she sees Dr. Steffanie Dunn with [facility] for DM management.Has had DM since age 48. States she takes Novolog tidwc - usually 20-30 units at home. Takes blood sugar log to MD visits for needed adjustments. Pt seems very depressed. "I don't care too much whether I wake up or not from surgery."  -May consider psych consult for depression as outpatient. Very little support at home. Has mentally handicapped daughter and father. -Decrease Novolog to sensitive tidwc and hs since pt has Type 1 DM. -Add meal coverage insulin - Novolog 8 units tidwc if pt eats > 50% meal.   Will follow daily. Thank you. Lorenda Peck, RD, LDN, CDE Inpatient Diabetes Coordinator [phone]

## 2015-07-10 LAB — BASIC METABOLIC PANEL
Anion gap: 8 (ref 5–15)
BUN: 20 mg/dL (ref 6–20)
CO2: 25 mmol/L (ref 22–32)
Calcium: 8.5 mg/dL — ABNORMAL LOW (ref 8.9–10.3)
Chloride: 107 mmol/L (ref 101–111)
Creatinine, Ser: 1.07 mg/dL — ABNORMAL HIGH (ref 0.44–1.00)
GFR calc Af Amer: >60 mL/min (ref >60)
GFR calc non Af Amer: 58 mL/min — ABNORMAL LOW (ref >60)
Glucose, Bld: 103 mg/dL — ABNORMAL HIGH (ref 65–99)
Potassium: 3.6 mmol/L (ref 3.5–5.1)
Sodium: 140 mmol/L (ref 135–145)

## 2015-07-10 LAB — GLUCOSE, CAPILLARY
Glucose-Capillary: 102 mg/dL — ABNORMAL HIGH (ref 65–99)
Glucose-Capillary: 142 mg/dL — ABNORMAL HIGH (ref 65–99)
Glucose-Capillary: 139 mg/dL — ABNORMAL HIGH (ref 65–99)
Glucose-Capillary: 154 mg/dL — ABNORMAL HIGH (ref 65–99)

## 2015-07-10 LAB — CBC
HCT: 32.2 % — ABNORMAL LOW (ref 36.0–46.0)
Hemoglobin: 10.6 g/dL — ABNORMAL LOW (ref 12.0–15.0)
MCH: 28.4 pg (ref 26.0–34.0)
MCHC: 32.9 g/dL (ref 30.0–36.0)
MCV: 86.3 fL (ref 78.0–100.0)
Platelets: 400 10*3/uL (ref 150–400)
RBC: 3.73 MIL/uL — ABNORMAL LOW (ref 3.87–5.11)
RDW: 13.8 % (ref 11.5–15.5)
WBC: 9.8 10*3/uL (ref 4.0–10.5)

## 2015-07-10 LAB — VITAMIN D 25 HYDROXY (VIT D DEFICIENCY, FRACTURES): Vit D, 25-Hydroxy: 8.6 ng/mL — ABNORMAL LOW (ref 30.0–100.0)

## 2015-07-10 MED ORDER — IBUPROFEN 200 MG PO TABS
600.0000 mg | ORAL_TABLET | Freq: Four times a day (QID) | ORAL | Status: DC | PRN
  Administered 2015-07-10 – 2015-07-11 (×2): 600 mg via ORAL
  Filled 2015-07-10 (×2): qty 3

## 2015-07-10 MED ORDER — INSULIN ASPART 100 UNIT/ML ~~LOC~~ SOLN
0.0000 [IU] | Freq: Every day | SUBCUTANEOUS | Status: DC
  Administered 2015-07-12: 2 [IU] via SUBCUTANEOUS

## 2015-07-10 MED ORDER — INSULIN ASPART 100 UNIT/ML ~~LOC~~ SOLN
0.0000 [IU] | Freq: Three times a day (TID) | SUBCUTANEOUS | Status: DC
  Administered 2015-07-11 – 2015-07-13 (×3): 2 [IU] via SUBCUTANEOUS

## 2015-07-10 MED ORDER — LIVING WELL WITH DIABETES BOOK
Freq: Once | Status: AC
  Administered 2015-07-10: 14:00:00
  Filled 2015-07-10: qty 1

## 2015-07-10 MED ORDER — INSULIN ASPART 100 UNIT/ML ~~LOC~~ SOLN
8.0000 [IU] | Freq: Three times a day (TID) | SUBCUTANEOUS | Status: DC
  Administered 2015-07-10: 8 [IU] via SUBCUTANEOUS
  Administered 2015-07-10: 5 [IU] via SUBCUTANEOUS
  Administered 2015-07-11 – 2015-07-13 (×5): 8 [IU] via SUBCUTANEOUS

## 2015-07-10 NOTE — Progress Notes (Signed)
[facility]                                                                              Patient Demographics  Suzanne Allen, is a 54 y.o. female, DOB - 1960/10/13, [id number]  Admit date - 07/08/2015   Admitting Physician Waldemar Dickens, MD  Outpatient Primary MD for the patient is Barrie Lyme, FNP  LOS - 2   Chief Complaint  Patient presents with  . Leg Pain      HPI on 07/08/2015 by Dr. Linna Darner Suzanne Allen is a 54 y.o. female  Right third toe swollen, painful, purplish black. Started 2 days ago. Patient states that prior to this toe is normal. Problem is constant. Getting worse. Has not tried anything for her symptoms to make them better. Worse with ambulation. Now with foul odor. Denies any fevers, rash, nausea, vomiting, chills, fatigue, decreased appetite. Some radiation of the pain up her leg.  States that she is compliant with her insulin regimen which includes NovoLog 20 units 4 times a day and Lantus 40 units twice a day. States that her blood sugar levels are typically in the mid 100 range.   Assessment & Plan   Sepsis secondary to Osteomyelitis of the right third toe -Patient did have one toe amputation on the same foot -Upon admission, patient had leukocytosis with mild tachycardia, both have resolved -Likely secondary to uncontrolled diabetes -Orthopedic consultation appreciated -Lactic acid was 2.97 upon admission however 1.58 after fluids -Right foot x-ray showed findings consistent with chronic osteomyelitis throughout the diaphysis and the head of the second metatarsal -Continue Flagyl, cefepime, doxycycline, patient has allergy to vancomycin -Pending further recommendations from orthopedics, spoke with Dr. Sharol Given who will see the patient today. -ABI of the right 1.2, left 1.1.  Left TBI borderline, abnormal.  Uncontrolled Diabetes mellitus, type I -Continue Lantus, insulin sliding scale and CBG monitoring -Hemoglobin A1c 10.9 -Diabetes  coordinator consulted for education -Added novolog 8u TIDwc, decreased ISS to sensitive  Hyperlipidemia -Continue statin  Hypothyroidism -Continue Synthroid  Depression/anxiety -Continue Cymbalta, Lamictal, Klonopin  Normocytic anemia -Hemoglobin 11-12, currently 10.3 -Suspect drop in hemoglobin due to dilutional component  CKD, stage III -Creatinine currently stable, continue to monitor BMP  Code Status: Full  Family Communication: None at bedside  Disposition Plan: Admitted. Pending further recommendations from orthopedics  Time Spent in minutes   30 minutes  Procedures  ABI  Consults   Orthopedics  DVT Prophylaxis  heparin  Lab Results  Component Value Date   PLT 400 07/10/2015    Medications  Scheduled Meds: . ceFEPime (MAXIPIME) IV  2 g Intravenous Q12H  . clonazePAM  0.5 mg Oral QHS  . doxycycline  100 mg Oral Q12H  . DULoxetine  60 mg Oral Daily  . feeding supplement (PRO-STAT SUGAR FREE 64)  30 mL Oral Daily  . heparin  5,000 Units Subcutaneous 3 times per day  . insulin aspart  0-20 Units Subcutaneous TID WC  . insulin aspart  0-5 Units Subcutaneous QHS  . insulin glargine  40 Units Subcutaneous BID  . lamoTRIgine  150 mg Oral Daily  . levothyroxine  137 mcg Oral QAC breakfast  .  metronidazole  500 mg Intravenous Q8H  . rosuvastatin  40 mg Oral q1800   Continuous Infusions: . sodium chloride 75 mL/hr at 07/08/15 1848   PRN Meds:.ondansetron **OR** ondansetron (ZOFRAN) IV  Antibiotics    Anti-infectives    Start     Dose/Rate Route Frequency Ordered Stop   07/09/15 0500  vancomycin (VANCOCIN) IVPB 750 mg/150 ml premix  Status:  Discontinued     750 mg 75 mL/hr over 120 Minutes Intravenous Every 12 hours 07/08/15 1559 07/08/15 1741   07/08/15 2000  doxycycline (VIBRA-TABS) tablet 100 mg     100 mg Oral Every 12 hours 07/08/15 1741     07/08/15 1900  metroNIDAZOLE (FLAGYL) IVPB 500 mg     500 mg 100 mL/hr over 60 Minutes Intravenous Every  8 hours 07/08/15 1741     07/08/15 1830  ceFEPIme (MAXIPIME) 2 g in dextrose 5 % 50 mL IVPB     2 g 100 mL/hr over 30 Minutes Intravenous Every 12 hours 07/08/15 1801     07/08/15 1500  vancomycin (VANCOCIN) 1,250 mg in sodium chloride 0.9 % 250 mL IVPB  Status:  Discontinued     1,250 mg 125 mL/hr over 120 Minutes Intravenous  Once 07/08/15 1457 07/08/15 1741      Subjective:   [name] seen and examined today.  Patient denies chest pain, shortness of breath, abdominal pain.  Waiting to see if this can be treated medically or if she will need surgery.   Objective:   Filed Vitals:   07/09/15 0610 07/09/15 1245 07/09/15 2103 07/10/15 0522  BP: 116/64 137/68 118/61 112/63  Pulse: 80 95 80 83  Temp: 97.9 F (36.6 C) 98.1 F (36.7 C) 98.6 F (37 C) 98.6 F (37 C)  TempSrc: Oral  Oral Oral  Resp: 18 18 18 18   Height:      Weight:      SpO2: 98% 100% 97% 97%    Wt Readings from Last 3 Encounters:  07/08/15 70.308 kg (155 lb)  06/17/15 74.118 kg (163 lb 6.4 oz)  05/27/15 73.483 kg (162 lb)     Intake/Output Summary (Last 24 hours) at 07/10/15 1107 Last data filed at 07/10/15 0900  Gross per 24 hour  Intake    480 ml  Output      0 ml  Net    480 ml    Exam  General: Well developed, well nourished, NAD  HEENT: NCAT,mucous membranes moist.   Cardiovascular: S1 S2 auscultated, RRR, no murmurs  Respiratory: Clear to auscultation bilaterally  Abdomen: Soft, nontender, nondistended, + bowel sounds  Extremities: warm dry without cyanosis clubbing or edema, TTP RLE  Neuro: AAOx3, nonfocal  Skin: Right foot currently wrapped however TTP  Psych: Depressed mood, flat affect  Data Review   Micro Results No results found for this or any previous visit (from the past 240 hour(s)).  Radiology Reports Dg Foot Complete Right  07/08/2015  CLINICAL DATA:  Foot would add diabetic patient. History of prior amputation. Initial encounter. EXAM: RIGHT FOOT COMPLETE - 3+  VIEW COMPARISON:  Plain films the right great toe 04/04/2013. FINDINGS: The patient is status post amputation at the level of the mid first metatarsal. Bony destructive change is seen in the head of the second metatarsal with sclerosis throughout the diaphysis of the second metatarsal. Soft tissues of the foot are swollen. Calcaneal spurring is noted. No fracture dislocation. IMPRESSION: Findings most consistent with chronic osteomyelitis throughout the diaphysis and head  of the second metatarsal. Status post amputation at the level of the mid first metatarsal. Diffuse soft tissue swelling. Electronically Signed   By: Inge Rise M.D.   On: 07/08/2015 13:10    CBC  Recent Labs Lab 07/08/15 1257 07/09/15 0535 07/10/15 0650  WBC 15.5* 11.4* 9.8  HGB 13.0 10.3* 10.6*  HCT 38.7 31.6* 32.2*  PLT 400 383 400  MCV 87.8 87.1 86.3  MCH 29.5 28.4 28.4  MCHC 33.6 32.6 32.9  RDW 14.1 13.9 13.8    Chemistries   Recent Labs Lab 07/08/15 1257 07/09/15 0535 07/10/15 0650  NA 137 138 140  K 4.5 3.7 3.6  CL 102 107 107  CO2 22 23 25   GLUCOSE 254* 183* 103*  BUN 26* 24* 20  CREATININE 1.18* 1.06* 1.07*  CALCIUM 9.3 8.4* 8.5*   ------------------------------------------------------------------------------------------------------------------ estimated creatinine clearance is 55.9 mL/min (by C-G formula based on Cr of 1.07). ------------------------------------------------------------------------------------------------------------------  Recent Labs  07/08/15 1844  HGBA1C 10.9*   ------------------------------------------------------------------------------------------------------------------ No results for input(s): CHOL, HDL, LDLCALC, TRIG, CHOLHDL, LDLDIRECT in the last 72 hours. ------------------------------------------------------------------------------------------------------------------ No results for input(s): TSH, T4TOTAL, T3FREE, THYROIDAB in the last 72 hours.  Invalid  input(s): FREET3 ------------------------------------------------------------------------------------------------------------------ No results for input(s): VITAMINB12, FOLATE, FERRITIN, TIBC, IRON, RETICCTPCT in the last 72 hours.  Coagulation profile  Recent Labs Lab 07/09/15 0535  INR 1.15    No results for input(s): DDIMER in the last 72 hours.  Cardiac Enzymes No results for input(s): CKMB, TROPONINI, MYOGLOBIN in the last 168 hours.  Invalid input(s): CK ------------------------------------------------------------------------------------------------------------------ Invalid input(s): POCBNP    Heath Badon D.O. on 07/10/2015 at 11:07 AM  Between 7am to 7pm - Pager - [phone]  After 7pm go to [email or web address] - password [email or web address]  And look for the night coverage person covering for me after hours  [facility] Office  [phone]

## 2015-07-10 NOTE — Clinical Documentation Improvement (Signed)
Orthopedics  (Query responses must be documented in the progress notes and discharged summary, not on the CDI BPA itself.  Query responses documented on the CDI BPA are not codeable because BPA's are not part of the permanent medical record.)  Please document the type of debridement (excisional or nonexcisional) and the instrumentation used to perform the debridement in the progress notes.   Ortho Consult Note 07/08/15 at 6:16 pm by Dr. Marcelino Scot: DEBRIDEMENT OF SKIN, SUBCU, AND FASCIA DOWN TO DISTAL PHALANX BONE  FINDINGS:  NO PURULENCE, scant bleeding     Please exercise your independent, professional judgment when responding. A specific answer is not anticipated or expected.   Thank You, Erling Conte  RN BSN CCDS [phone] Health Information Management [place]

## 2015-07-10 NOTE — Consult Note (Signed)
Reason for Consult: Osteomyelitis right foot third toe and second metatarsal head Referring Physician: Dr. Wallene Huh Suzanne Allen is an 54 y.o. female.  HPI: Patient is a 54 year old woman with poorly controlled diabetes type 1 who is status post partial first ray amputation in [place]. Patient states she's had on and off problems with ulceration of the third toe.  Past Medical History  Diagnosis Date  . HTN (hypertension)   . Macular degeneration, bilateral   . Hypercholesteremia   . Hypothyroidism   . Stroke [facility]) ? date & 09/22/2012  . Peripheral neuropathy ([place])   . Anxiety   . Depression   . PTSD (post-traumatic stress disorder)   . 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 Fatty liver 04/04/2014  . Carpal tunnel syndrome 11/28/2014    Bilateral  . CAP (community acquired pneumonia) 03/13/2015  . Pneumonia 1993  . Diabetes mellitus type I ([place]) dx'd 1977  . Diabetic osteomyelitis ([employer])     right great toe/progress notes 09/23/2012  . Migraines 1980's    "when I was in my 20's" (03/14/2015)  . Chronic pancreatitis ([place])   . Diabetic retinopathy ([place])   . TIA (transient ischemic attack) 09/22/2012    Suzanne Allen 10/25/2012    Past Surgical History  Procedure Laterality Date  . Cesarean section  1989; 1995  . Tonsillectomy  1965  . Refractive surgery Bilateral 2013  . Total thyroidectomy  2012  . Peripherally inserted central catheter insertion Right 08/31/2012  . I&d extremity Right multiple    "big toe"  . Toe amputation Right 11/2012    "big toe"  . Tubal ligation  1995  . Eye surgery Bilateral     "hemorrhaged behind my eye"  . Picc line removal ([facility]) Right 10/12/2012    Suzanne Allen 10/12/2012    Family History  Problem Relation Age of Onset  . Depression Mother   . Alcohol abuse Father   . Alcohol abuse Sister     Social History:  reports that she has never smoked. She has never used smokeless tobacco. She reports that she does not drink alcohol or  use illicit drugs.  Allergies:  Allergies  Allergen Reactions  . Erythromycin Anaphylaxis  . Vancomycin Other (See Comments)    Red Man Syndrome    Medications: I have reviewed the patient's current medications.  Results for orders placed or performed during the hospital encounter of 07/08/15 (from the past 48 hour(s))  Hemoglobin A1c     Status: Abnormal   Collection Time: 07/08/15  6:44 PM  Result Value Ref Range   Hgb A1c MFr Bld 10.9 (H) 4.8 - 5.6 %    Comment: (NOTE)         Pre-diabetes: 5.7 - 6.4         Diabetes: >6.4         Glycemic control for adults with diabetes: <7.0    Mean Plasma Glucose 266 mg/dL    Comment: (NOTE) Performed At: [facility] [address] [place], [place] [id number] Lindon Romp MD [id number]   HIV antibody     Status: None   Collection Time: 07/08/15  6:44 PM  Result Value Ref Range   HIV Screen 4th Generation wRfx Non Reactive Non Reactive    Comment: (NOTE) Performed At: [facility] [place], [place] [id number] Lindon Romp MD [id number]   Sedimentation rate     Status: Abnormal   Collection Time: 07/08/15  6:44 PM  Result Value Ref Range   Sed Rate 97 (H) 0 - 22 mm/hr  C-reactive protein     Status: Abnormal   Collection Time: 07/08/15  6:44 PM  Result Value Ref Range   CRP 5.9 (H) <1.0 mg/dL  Prealbumin     Status: None   Collection Time: 07/08/15  6:44 PM  Result Value Ref Range   Prealbumin 20.5 18 - 38 mg/dL  VITAMIN D 25 Hydroxy (Vit-D Deficiency, Fractures)     Status: Abnormal   Collection Time: 07/08/15  6:44 PM  Result Value Ref Range   Vit D, 25-Hydroxy 8.6 (L) 30.0 - 100.0 ng/mL    Comment: (NOTE) Vitamin D deficiency has been defined by the [place] practice guideline as a level of serum 25-OH vitamin D less than 20 ng/mL (1,2). The Endocrine Society went on to further define vitamin D insufficiency as a level between 21 and 29  ng/mL (2). 1. IOM (Institute of Medicine). 2010. Dietary reference   intakes for calcium and D. [place]: The   [employer]. 2. Holick MF, Binkley Clay, Bischoff-Ferrari HA, et al.   Evaluation, treatment, and prevention of vitamin D   deficiency: an Endocrine Society clinical practice   guideline. JCEM. 2011 Jul; 96(7):1911-30. Performed At: [facility] [place], [place] [id number] Lindon Romp MD [id number]   Glucose, capillary     Status: Abnormal   Collection Time: 07/08/15  9:06 PM  Result Value Ref Range   Glucose-Capillary 337 (H) 65 - 99 mg/dL  CBC     Status: Abnormal   Collection Time: 07/09/15  5:35 AM  Result Value Ref Range   WBC 11.4 (H) 4.0 - 10.5 K/uL   RBC 3.63 (L) 3.87 - 5.11 MIL/uL   Hemoglobin 10.3 (L) 12.0 - 15.0 g/dL    Comment: RESULT REPEATED AND VERIFIED   HCT 31.6 (L) 36.0 - 46.0 %   MCV 87.1 78.0 - 100.0 fL   MCH 28.4 26.0 - 34.0 pg   MCHC 32.6 30.0 - 36.0 g/dL   RDW 13.9 11.5 - 15.5 %   Platelets 383 150 - 400 K/uL  Basic metabolic panel     Status: Abnormal   Collection Time: 07/09/15  5:35 AM  Result Value Ref Range   Sodium 138 135 - 145 mmol/L   Potassium 3.7 3.5 - 5.1 mmol/L   Chloride 107 101 - 111 mmol/L   CO2 23 22 - 32 mmol/L   Glucose, Bld 183 (H) 65 - 99 mg/dL   BUN 24 (H) 6 - 20 mg/dL   Creatinine, Ser 1.06 (H) 0.44 - 1.00 mg/dL   Calcium 8.4 (L) 8.9 - 10.3 mg/dL   GFR calc non Af Amer 59 (L) >60 mL/min   GFR calc Af Amer >60 >60 mL/min    Comment: (NOTE) The eGFR has been calculated using the CKD EPI equation. This calculation has not been validated in all clinical situations. eGFR's persistently <60 mL/min signify possible Chronic Kidney Disease.    Anion gap 8 5 - 15  APTT     Status: Abnormal   Collection Time: 07/09/15  5:35 AM  Result Value Ref Range   aPTT 39 (H) 24 - 37 seconds    Comment:        IF BASELINE aPTT IS ELEVATED, SUGGEST PATIENT RISK ASSESSMENT BE USED TO  DETERMINE APPROPRIATE ANTICOAGULANT THERAPY.   Protime-INR     Status: None  Collection Time: 07/09/15  5:35 AM  Result Value Ref Range   Prothrombin Time 14.9 11.6 - 15.2 seconds   INR 1.15 0.00 - 1.49  Glucose, capillary     Status: Abnormal   Collection Time: 07/09/15  6:08 AM  Result Value Ref Range   Glucose-Capillary 167 (H) 65 - 99 mg/dL  Glucose, capillary     Status: Abnormal   Collection Time: 07/09/15 11:34 AM  Result Value Ref Range   Glucose-Capillary 153 (H) 65 - 99 mg/dL   Comment 1 Notify RN   Glucose, capillary     Status: Abnormal   Collection Time: 07/09/15  4:11 PM  Result Value Ref Range   Glucose-Capillary 271 (H) 65 - 99 mg/dL   Comment 1 Notify RN   Glucose, capillary     Status: Abnormal   Collection Time: 07/09/15 10:10 PM  Result Value Ref Range   Glucose-Capillary 161 (H) 65 - 99 mg/dL  Glucose, capillary     Status: Abnormal   Collection Time: 07/10/15  6:34 AM  Result Value Ref Range   Glucose-Capillary 102 (H) 65 - 99 mg/dL  CBC     Status: Abnormal   Collection Time: 07/10/15  6:50 AM  Result Value Ref Range   WBC 9.8 4.0 - 10.5 K/uL   RBC 3.73 (L) 3.87 - 5.11 MIL/uL   Hemoglobin 10.6 (L) 12.0 - 15.0 g/dL   HCT 32.2 (L) 36.0 - 46.0 %   MCV 86.3 78.0 - 100.0 fL   MCH 28.4 26.0 - 34.0 pg   MCHC 32.9 30.0 - 36.0 g/dL   RDW 13.8 11.5 - 15.5 %   Platelets 400 150 - 400 K/uL  Basic metabolic panel     Status: Abnormal   Collection Time: 07/10/15  6:50 AM  Result Value Ref Range   Sodium 140 135 - 145 mmol/L   Potassium 3.6 3.5 - 5.1 mmol/L   Chloride 107 101 - 111 mmol/L   CO2 25 22 - 32 mmol/L   Glucose, Bld 103 (H) 65 - 99 mg/dL   BUN 20 6 - 20 mg/dL   Creatinine, Ser 1.07 (H) 0.44 - 1.00 mg/dL   Calcium 8.5 (L) 8.9 - 10.3 mg/dL   GFR calc non Af Amer 58 (L) >60 mL/min   GFR calc Af Amer >60 >60 mL/min    Comment: (NOTE) The eGFR has been calculated using the CKD EPI equation. This calculation has not been validated in all clinical  situations. eGFR's persistently <60 mL/min signify possible Chronic Kidney Disease.    Anion gap 8 5 - 15  Glucose, capillary     Status: Abnormal   Collection Time: 07/10/15 11:17 AM  Result Value Ref Range   Glucose-Capillary 142 (H) 65 - 99 mg/dL   Comment 1 Notify RN   Glucose, capillary     Status: Abnormal   Collection Time: 07/10/15  4:14 PM  Result Value Ref Range   Glucose-Capillary 139 (H) 65 - 99 mg/dL   Comment 1 Notify RN     No results found.  Review of Systems  All other systems reviewed and are negative.  Blood pressure 121/70, pulse 85, temperature 98.4 F (36.9 C), temperature source Oral, resp. rate 18, height _0  (1.575 m), weight 70.308 kg (155 lb), SpO2 100 %. Physical Exam On examination patient has no ascending cellulitis she has a good dorsalis pedis pulse she has good motor function she has insensate neuropathy. Patient has sausage digit swelling with  ulceration down to bone Wagner grade 3 ulcer of the right foot third toe. She has no redness or swelling or tenderness to palpation around the second metatarsal head status post a well-healed partial first ray amputation. Review of the radiographs shows osteomyelitis of the second metatarsal head and shows osteomyelitis of the third toe. Brachial indices show good circulation. Hemoglobin A1c 10.9. Assessment/Plan: Assessment: Diabetic insensate neuropathy with good circulation with osteomyelitis chronic of the second metatarsal head right foot and osteomyelitis and ulceration of the third toe right foot.  Plan: I discussed that a foot salvage option would be a transmetatarsal amputation. Risks and benefits were discussed including risk of the wound not healing need for additional surgery. Patient states she understands she states she would like to proceed with foot salvage intervention we will plan for surgery Friday afternoon. Continue IV antibiotics for 24 hours postoperatively. She will need to be  nonweightbearing on the right postoperatively for at least 4 weeks.  Suzanne Allen,Suzanne Allen 07/10/2015, 6:28 PM

## 2015-07-11 LAB — BASIC METABOLIC PANEL
Anion gap: 8 (ref 5–15)
BUN: 26 mg/dL — ABNORMAL HIGH (ref 6–20)
CO2: 23 mmol/L (ref 22–32)
Calcium: 8.4 mg/dL — ABNORMAL LOW (ref 8.9–10.3)
Chloride: 109 mmol/L (ref 101–111)
Creatinine, Ser: 1.16 mg/dL — ABNORMAL HIGH (ref 0.44–1.00)
GFR calc Af Amer: >60 mL/min (ref >60)
GFR calc non Af Amer: 53 mL/min — ABNORMAL LOW (ref >60)
Glucose, Bld: 79 mg/dL (ref 65–99)
Potassium: 3.5 mmol/L (ref 3.5–5.1)
Sodium: 140 mmol/L (ref 135–145)

## 2015-07-11 LAB — CBC
HCT: 31.8 % — ABNORMAL LOW (ref 36.0–46.0)
Hemoglobin: 10.5 g/dL — ABNORMAL LOW (ref 12.0–15.0)
MCH: 28.7 pg (ref 26.0–34.0)
MCHC: 33 g/dL (ref 30.0–36.0)
MCV: 86.9 fL (ref 78.0–100.0)
Platelets: 383 10*3/uL (ref 150–400)
RBC: 3.66 MIL/uL — ABNORMAL LOW (ref 3.87–5.11)
RDW: 13.8 % (ref 11.5–15.5)
WBC: 9.2 10*3/uL (ref 4.0–10.5)

## 2015-07-11 LAB — GLUCOSE, CAPILLARY
Glucose-Capillary: 85 mg/dL (ref 65–99)
Glucose-Capillary: 95 mg/dL (ref 65–99)
Glucose-Capillary: 198 mg/dL — ABNORMAL HIGH (ref 65–99)
Glucose-Capillary: 114 mg/dL — ABNORMAL HIGH (ref 65–99)

## 2015-07-11 LAB — MRSA PCR SCREENING: MRSA by PCR: NEGATIVE

## 2015-07-11 NOTE — Clinical Social Work Note (Signed)
CSW received referral for SNF.  Case discussed with case manager and plan is to discharge home with home health.  CSW to sign off please re-consult if social work needs arise.  Darril Patriarca R. Jakerria Kingbird, MSW, LCSWA [phone]  

## 2015-07-11 NOTE — Progress Notes (Signed)
Pharmacy Antibiotic Follow-up Note  Suzanne Allen is a 54 y.o. year-old female admitted on 07/08/2015.  The patient is currently on day #3 of cefepime/doxycycline/Flagyl for DFI.  Assessment/Plan: Day #4 of abx for DFI. Will likely end up with BKA due to poor glucose control. Ortho saw and will plan for foot salvage intervention on 12/30 with transmetatarsal amputation. Patient understand may need more surgery later to amputate more if does not heal. CRP elevated at 5.9. Plan is to continue IV abx for 24hrs s/p OR. Afebrile, WBC wnl. SCr 1.16, CrCl ~29ml/min.   Plan: Continue cefepime 2g IV Q12 Continue doxy 100mg  PO Q12 per MD Continue metronidazole 500mg  IV Q8 per MD F/U stopping abx 24hrs s/p amputation   Temp (24hrs), Avg:98.2 F (36.8 C), Min:97.7 F (36.5 C), Max:98.5 F (36.9 C)   Recent Labs Lab 07/08/15 1257 07/09/15 0535 07/10/15 0650 07/11/15 0432  WBC 15.5* 11.4* 9.8 9.2    Recent Labs Lab 07/08/15 1257 07/09/15 0535 07/10/15 0650 07/11/15 0432  CREATININE 1.18* 1.06* 1.07* 1.16*   Estimated Creatinine Clearance: 51.5 mL/min (by C-G formula based on Cr of 1.16).    Allergies  Allergen Reactions  . Erythromycin Anaphylaxis  . Vancomycin Other (See Comments)    Red Man Syndrome    Antimicrobials this admission: 12/26 Cefepime >> 12/26 Doxy (po) >>  12/26 Flagyl  (iv) >>   Microbiology results: 12/26 blood x2 > NGTD  Thank you for allowing pharmacy to be a part of this patient's care.  Elenor Quinones, PharmD, BCPS Clinical Pharmacist Pager [phone] 07/11/2015 10:33 AM

## 2015-07-11 NOTE — Clinical Social Work Note (Signed)
CSW received consult for access to medications, case manager will assist with this, CSW to sign off, please reconsult if other social work needs arise.  Jones Broom. [place], MSW, [place] 07/11/2015 8:58 AM

## 2015-07-11 NOTE — Progress Notes (Signed)
[facility]                                                                              Patient Demographics  Suzanne Allen, is a 54 y.o. female, DOB - 05/30/1961, [id number]  Admit date - 07/08/2015   Admitting Physician Waldemar Dickens, MD  Outpatient Primary MD for the patient is Barrie Lyme, FNP  LOS - 3   Chief Complaint  Patient presents with  . Leg Pain      HPI on 07/08/2015 by Dr. Linna Darner Suzanne Allen is a 54 y.o. female  Right third toe swollen, painful, purplish black. Started 2 days ago. Patient states that prior to this toe is normal. Problem is constant. Getting worse. Has not tried anything for her symptoms to make them better. Worse with ambulation. Now with foul odor. Denies any fevers, rash, nausea, vomiting, chills, fatigue, decreased appetite. Some radiation of the pain up her leg.  States that she is compliant with her insulin regimen which includes NovoLog 20 units 4 times a day and Lantus 40 units twice a day. States that her blood sugar levels are typically in the mid 100 range.   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  Left TBI borderline, abnormal. -Dr. Sharol Given, orthopedics consulted and appreciated, plan for surgery 07/11/2015  Uncontrolled Diabetes mellitus, type I -Continue Lantus, insulin sliding scale and CBG monitoring -Hemoglobin A1c 10.9 -Diabetes coordinator consulted for  education  Hyperlipidemia -Continue statin  Hypothyroidism -Continue Synthroid  Depression/anxiety -Continue Cymbalta, Lamictal, Klonopin  Normocytic anemia -Hemoglobin 11-12, currently 10.5 -Continue to monitor CBC  CKD, stage III -Creatinine currently stable, 1.16 -continue to monitor BMP  Code Status: Full  Family Communication: Family at bedside  Disposition Plan: Admitted. Surgery planned for 07/11/2015  Time Spent in minutes   30 minutes  Procedures  ABI  Consults   Orthopedics  DVT Prophylaxis  heparin  Lab Results  Component Value Date   PLT 383 07/11/2015    Medications  Scheduled Meds: . ceFEPime (MAXIPIME) IV  2 g Intravenous Q12H  . clonazePAM  0.5 mg Oral QHS  . doxycycline  100 mg Oral Q12H  . DULoxetine  60 mg Oral Daily  . feeding supplement (PRO-STAT SUGAR FREE 64)  30 mL Oral Daily  . heparin  5,000 Units Subcutaneous 3 times per day  . insulin aspart  0-5 Units Subcutaneous QHS  . insulin aspart  0-9 Units Subcutaneous TID WC  . insulin aspart  8 Units Subcutaneous TID WC  . insulin glargine  40 Units Subcutaneous BID  . lamoTRIgine  150 mg Oral Daily  . levothyroxine  137 mcg Oral QAC breakfast  . metronidazole  500 mg Intravenous Q8H  .  rosuvastatin  40 mg Oral q1800   Continuous Infusions: . sodium chloride 75 mL/hr at 07/08/15 1848   PRN Meds:.ibuprofen, ondansetron **OR** ondansetron (ZOFRAN) IV  Antibiotics    Anti-infectives    Start     Dose/Rate Route Frequency Ordered Stop   07/09/15 0500  vancomycin (VANCOCIN) IVPB 750 mg/150 ml premix  Status:  Discontinued     750 mg 75 mL/hr over 120 Minutes Intravenous Every 12 hours 07/08/15 1559 07/08/15 1741   07/08/15 2000  doxycycline (VIBRA-TABS) tablet 100 mg     100 mg Oral Every 12 hours 07/08/15 1741     07/08/15 1900  metroNIDAZOLE (FLAGYL) IVPB 500 mg     500 mg 100 mL/hr over 60 Minutes Intravenous Every 8 hours 07/08/15 1741     07/08/15 1830  ceFEPIme  (MAXIPIME) 2 g in dextrose 5 % 50 mL IVPB     2 g 100 mL/hr over 30 Minutes Intravenous Every 12 hours 07/08/15 1801     07/08/15 1500  vancomycin (VANCOCIN) 1,250 mg in sodium chloride 0.9 % 250 mL IVPB  Status:  Discontinued     1,250 mg 125 mL/hr over 120 Minutes Intravenous  Once 07/08/15 1457 07/08/15 1741      Subjective:   Suzanne Allen seen and examined today.  Patient denies chest pain, shortness of breath, abdominal pain.  Would like to know when she can go home after surgery.   Objective:   Filed Vitals:   07/10/15 0522 07/10/15 1300 07/10/15 1956 07/11/15 0459  BP: 112/63 121/70 122/67 122/64  Pulse: 83 85 82 73  Temp: 98.6 F (37 C) 98.4 F (36.9 C) 98.5 F (36.9 C) 97.7 F (36.5 C)  TempSrc: Oral  Oral Oral  Resp: 18 18 18 18   Height:      Weight:      SpO2: 97% 100% 98% 97%    Wt Readings from Last 3 Encounters:  07/08/15 70.308 kg (155 lb)  06/17/15 74.118 kg (163 lb 6.4 oz)  05/27/15 73.483 kg (162 lb)     Intake/Output Summary (Last 24 hours) at 07/11/15 1226 Last data filed at 07/10/15 1300  Gross per 24 hour  Intake    240 ml  Output      0 ml  Net    240 ml    Exam  General: Well developed, well nourished, NAD  HEENT: NCAT,mucous membranes moist.   Cardiovascular: S1 S2 auscultated, RRR, no murmurs  Respiratory: Clear to auscultation bilaterally  Abdomen: Soft, nontender, nondistended, + bowel sounds  Extremities: warm dry without cyanosis clubbing or edema, TTP RLE  Neuro: AAOx3, nonfocal  Skin: Right foot currently wrapped however TTP  Psych: Appropriate mood and affect  Data Review   Micro Results Recent Results (from the past 240 hour(s))  Blood culture (routine x 2)     Status: None (Preliminary result)   Collection Time: 07/08/15  3:30 PM  Result Value Ref Range Status   Specimen Description BLOOD LEFT ARM  Final   Special Requests IN PEDIATRIC BOTTLE 4CC  Final   Culture NO GROWTH 3 DAYS  Final   Report Status  PENDING  Incomplete  Blood culture (routine x 2)     Status: None (Preliminary result)   Collection Time: 07/08/15  3:44 PM  Result Value Ref Range Status   Specimen Description BLOOD LEFT HAND  Final   Special Requests BOTTLES DRAWN AEROBIC ONLY 5CC  Final   Culture NO GROWTH 3 DAYS  Final  Report Status PENDING  Incomplete    Radiology Reports Dg Foot Complete Right  07/08/2015  CLINICAL DATA:  Foot would add diabetic patient. History of prior amputation. Initial encounter. EXAM: RIGHT FOOT COMPLETE - 3+ VIEW COMPARISON:  Plain films the right great toe 04/04/2013. FINDINGS: The patient is status post amputation at the level of the mid first metatarsal. Bony destructive change is seen in the head of the second metatarsal with sclerosis throughout the diaphysis of the second metatarsal. Soft tissues of the foot are swollen. Calcaneal spurring is noted. No fracture dislocation. IMPRESSION: Findings most consistent with chronic osteomyelitis throughout the diaphysis and head of the second metatarsal. Status post amputation at the level of the mid first metatarsal. Diffuse soft tissue swelling. Electronically Signed   By: Inge Rise M.D.   On: 07/08/2015 13:10    CBC  Recent Labs Lab 07/08/15 1257 07/09/15 0535 07/10/15 0650 07/11/15 0432  WBC 15.5* 11.4* 9.8 9.2  HGB 13.0 10.3* 10.6* 10.5*  HCT 38.7 31.6* 32.2* 31.8*  PLT 400 383 400 383  MCV 87.8 87.1 86.3 86.9  MCH 29.5 28.4 28.4 28.7  MCHC 33.6 32.6 32.9 33.0  RDW 14.1 13.9 13.8 13.8    Chemistries   Recent Labs Lab 07/08/15 1257 07/09/15 0535 07/10/15 0650 07/11/15 0432  NA 137 138 140 140  K 4.5 3.7 3.6 3.5  CL 102 107 107 109  CO2 22 23 25 23   GLUCOSE 254* 183* 103* 79  BUN 26* 24* 20 26*  CREATININE 1.18* 1.06* 1.07* 1.16*  CALCIUM 9.3 8.4* 8.5* 8.4*   ------------------------------------------------------------------------------------------------------------------ estimated creatinine clearance is  51.5 mL/min (by C-G formula based on Cr of 1.16). ------------------------------------------------------------------------------------------------------------------  Recent Labs  07/08/15 1844  HGBA1C 10.9*   ------------------------------------------------------------------------------------------------------------------ No results for input(s): CHOL, HDL, LDLCALC, TRIG, CHOLHDL, LDLDIRECT in the last 72 hours. ------------------------------------------------------------------------------------------------------------------ No results for input(s): TSH, T4TOTAL, T3FREE, THYROIDAB in the last 72 hours.  Invalid input(s): FREET3 ------------------------------------------------------------------------------------------------------------------ No results for input(s): VITAMINB12, FOLATE, FERRITIN, TIBC, IRON, RETICCTPCT in the last 72 hours.  Coagulation profile  Recent Labs Lab 07/09/15 0535  INR 1.15    No results for input(s): DDIMER in the last 72 hours.  Cardiac Enzymes No results for input(s): CKMB, TROPONINI, MYOGLOBIN in the last 168 hours.  Invalid input(s): CK ------------------------------------------------------------------------------------------------------------------ Invalid input(s): POCBNP    Lequita Meadowcroft D.O. on 07/11/2015 at 12:26 PM  Between 7am to 7pm - Pager - [phone]  After 7pm go to [email or web address] - password [email or web address]  And look for the night coverage person covering for me after hours  [facility] Office  [phone]

## 2015-07-12 ENCOUNTER — Inpatient Hospital Stay (HOSPITAL_COMMUNITY): Payer: Medicaid Other | Admitting: Certified Registered Nurse Anesthetist

## 2015-07-12 ENCOUNTER — Encounter (HOSPITAL_COMMUNITY)
Admission: EM | Disposition: A | Discharge status: Home / Self Care | Payer: Self-pay | Source: Home / Self Care | Attending: Internal Medicine

## 2015-07-12 HISTORY — PX: AMPUTATION: SHX166

## 2015-07-12 LAB — GLUCOSE, CAPILLARY
Glucose-Capillary: 162 mg/dL — ABNORMAL HIGH (ref 65–99)
Glucose-Capillary: 152 mg/dL — ABNORMAL HIGH (ref 65–99)
Glucose-Capillary: 139 mg/dL — ABNORMAL HIGH (ref 65–99)
Glucose-Capillary: 123 mg/dL — ABNORMAL HIGH (ref 65–99)
Glucose-Capillary: 127 mg/dL — ABNORMAL HIGH (ref 65–99)
Glucose-Capillary: 207 mg/dL — ABNORMAL HIGH (ref 65–99)

## 2015-07-12 LAB — BASIC METABOLIC PANEL
Anion gap: 9 (ref 5–15)
BUN: 19 mg/dL (ref 6–20)
CO2: 23 mmol/L (ref 22–32)
Calcium: 8.7 mg/dL — ABNORMAL LOW (ref 8.9–10.3)
Chloride: 107 mmol/L (ref 101–111)
Creatinine, Ser: 0.93 mg/dL (ref 0.44–1.00)
GFR calc Af Amer: >60 mL/min (ref >60)
GFR calc non Af Amer: >60 mL/min (ref >60)
Glucose, Bld: 196 mg/dL — ABNORMAL HIGH (ref 65–99)
Potassium: 4.4 mmol/L (ref 3.5–5.1)
Sodium: 139 mmol/L (ref 135–145)

## 2015-07-12 LAB — CBC
HCT: 35.8 % — ABNORMAL LOW (ref 36.0–46.0)
Hemoglobin: 11.6 g/dL — ABNORMAL LOW (ref 12.0–15.0)
MCH: 28.2 pg (ref 26.0–34.0)
MCHC: 32.4 g/dL (ref 30.0–36.0)
MCV: 86.9 fL (ref 78.0–100.0)
Platelets: 443 10*3/uL — ABNORMAL HIGH (ref 150–400)
RBC: 4.12 MIL/uL (ref 3.87–5.11)
RDW: 13.7 % (ref 11.5–15.5)
WBC: 11 10*3/uL — ABNORMAL HIGH (ref 4.0–10.5)

## 2015-07-12 SURGERY — AMPUTATION, FOOT, RAY
Anesthesia: General | Laterality: Right

## 2015-07-12 MED ORDER — 0.9 % SODIUM CHLORIDE (POUR BTL) OPTIME
TOPICAL | Status: DC | PRN
  Administered 2015-07-12: 1000 mL

## 2015-07-12 MED ORDER — METOCLOPRAMIDE HCL 5 MG PO TABS: 5.0000 mg | ORAL_TABLET | Freq: Three times a day (TID) | ORAL | Status: DC | PRN

## 2015-07-12 MED ORDER — ONDANSETRON HCL 4 MG/2ML IJ SOLN
INTRAMUSCULAR | Status: DC | PRN
  Administered 2015-07-12: 4 mg via INTRAVENOUS

## 2015-07-12 MED ORDER — LACTATED RINGERS IV SOLN
INTRAVENOUS | Status: DC
  Administered 2015-07-12: 15:00:00 via INTRAVENOUS

## 2015-07-12 MED ORDER — ONDANSETRON HCL 4 MG/2ML IJ SOLN
4.0000 mg | Freq: Four times a day (QID) | INTRAMUSCULAR | Status: DC | PRN
  Administered 2015-07-12: 4 mg via INTRAVENOUS
  Filled 2015-07-12: qty 2

## 2015-07-12 MED ORDER — METOCLOPRAMIDE HCL 5 MG/ML IJ SOLN
5.0000 mg | Freq: Three times a day (TID) | INTRAMUSCULAR | Status: DC | PRN
  Administered 2015-07-13: 10 mg via INTRAVENOUS
  Filled 2015-07-12: qty 2

## 2015-07-12 MED ORDER — PROPOFOL 10 MG/ML IV BOLUS
INTRAVENOUS | Status: DC | PRN
  Administered 2015-07-12: 200 mg via INTRAVENOUS

## 2015-07-12 MED ORDER — LACTATED RINGERS IV SOLN
INTRAVENOUS | Status: DC | PRN
  Administered 2015-07-12: 15:00:00 via INTRAVENOUS

## 2015-07-12 MED ORDER — SODIUM CHLORIDE 0.45 % IV SOLN
INTRAVENOUS | Status: DC
Start: 2015-07-12 — End: 2015-07-12

## 2015-07-12 MED ORDER — SODIUM CHLORIDE 0.9 % IV SOLN
INTRAVENOUS | Status: DC
  Administered 2015-07-12: 19:00:00 via INTRAVENOUS

## 2015-07-12 MED ORDER — ONDANSETRON HCL 4 MG/2ML IJ SOLN
INTRAMUSCULAR | Status: AC
  Filled 2015-07-12: qty 2

## 2015-07-12 MED ORDER — HYDROMORPHONE HCL 1 MG/ML IJ SOLN: 0.2500 mg | INTRAMUSCULAR | Status: DC | PRN

## 2015-07-12 MED ORDER — PHENYLEPHRINE 40 MCG/ML (10ML) SYRINGE FOR IV PUSH (FOR BLOOD PRESSURE SUPPORT)
PREFILLED_SYRINGE | INTRAVENOUS | Status: AC
  Filled 2015-07-12: qty 10

## 2015-07-12 MED ORDER — MIDAZOLAM HCL 5 MG/5ML IJ SOLN
INTRAMUSCULAR | Status: DC | PRN
  Administered 2015-07-12: 2 mg via INTRAVENOUS

## 2015-07-12 MED ORDER — METHOCARBAMOL 500 MG PO TABS
500.0000 mg | ORAL_TABLET | Freq: Four times a day (QID) | ORAL | Status: DC | PRN
  Administered 2015-07-13: 500 mg via ORAL
  Filled 2015-07-12: qty 1

## 2015-07-12 MED ORDER — ONDANSETRON HCL 4 MG PO TABS
4.0000 mg | ORAL_TABLET | Freq: Four times a day (QID) | ORAL | Status: DC | PRN
  Administered 2015-07-13: 4 mg via ORAL
  Filled 2015-07-12: qty 1

## 2015-07-12 MED ORDER — LIDOCAINE HCL (CARDIAC) 20 MG/ML IV SOLN
INTRAVENOUS | Status: DC | PRN
  Administered 2015-07-12: 60 mg via INTRAVENOUS

## 2015-07-12 MED ORDER — FENTANYL CITRATE (PF) 250 MCG/5ML IJ SOLN
INTRAMUSCULAR | Status: AC
  Filled 2015-07-12: qty 5

## 2015-07-12 MED ORDER — ACETAMINOPHEN 650 MG RE SUPP: 650.0000 mg | Freq: Four times a day (QID) | RECTAL | Status: DC | PRN

## 2015-07-12 MED ORDER — METHOCARBAMOL 1000 MG/10ML IJ SOLN
500.0000 mg | Freq: Four times a day (QID) | INTRAVENOUS | Status: DC | PRN
  Filled 2015-07-12: qty 5

## 2015-07-12 MED ORDER — ACETAMINOPHEN 325 MG PO TABS: 650.0000 mg | ORAL_TABLET | Freq: Four times a day (QID) | ORAL | Status: DC | PRN

## 2015-07-12 MED ORDER — OXYCODONE HCL 5 MG PO TABS
5.0000 mg | ORAL_TABLET | ORAL | Status: DC | PRN
  Administered 2015-07-12 – 2015-07-13 (×3): 10 mg via ORAL
  Filled 2015-07-12 (×3): qty 2

## 2015-07-12 MED ORDER — DEXTROSE 5 % IV SOLN
2.0000 g | Freq: Two times a day (BID) | INTRAVENOUS | Status: DC
  Administered 2015-07-13: 2 g via INTRAVENOUS
  Filled 2015-07-12 (×3): qty 2

## 2015-07-12 MED ORDER — PHENYLEPHRINE HCL 10 MG/ML IJ SOLN
INTRAMUSCULAR | Status: DC | PRN
  Administered 2015-07-12 (×2): 80 ug via INTRAVENOUS

## 2015-07-12 MED ORDER — FENTANYL CITRATE (PF) 100 MCG/2ML IJ SOLN
INTRAMUSCULAR | Status: DC | PRN
  Administered 2015-07-12: 100 ug via INTRAVENOUS

## 2015-07-12 MED ORDER — EPHEDRINE SULFATE 50 MG/ML IJ SOLN
INTRAMUSCULAR | Status: AC
  Filled 2015-07-12: qty 1

## 2015-07-12 MED ORDER — PROMETHAZINE HCL 25 MG/ML IJ SOLN: 6.2500 mg | INTRAMUSCULAR | Status: DC | PRN

## 2015-07-12 MED ORDER — MIDAZOLAM HCL 2 MG/2ML IJ SOLN
INTRAMUSCULAR | Status: AC
  Filled 2015-07-12: qty 2

## 2015-07-12 MED ORDER — HYDROMORPHONE HCL 1 MG/ML IJ SOLN: 0.5000 mg | INTRAMUSCULAR | Status: DC | PRN

## 2015-07-12 SURGICAL SUPPLY — 37 items
BLADE SAW SGTL MED 73X18.5 STR (BLADE) IMPLANT
BNDG COHESIVE 4X5 TAN STRL (GAUZE/BANDAGES/DRESSINGS) ×2 IMPLANT
BNDG GAUZE ELAST 4 BULKY (GAUZE/BANDAGES/DRESSINGS) ×4 IMPLANT
COVER SURGICAL LIGHT HANDLE (MISCELLANEOUS) ×4 IMPLANT
DRAPE U-SHAPE 47X51 STRL (DRAPES) ×4 IMPLANT
DRSG ADAPTIC 3X8 NADH LF (GAUZE/BANDAGES/DRESSINGS) ×2 IMPLANT
DRSG PAD ABDOMINAL 8X10 ST (GAUZE/BANDAGES/DRESSINGS) ×4 IMPLANT
DURAPREP 26ML APPLICATOR (WOUND CARE) ×2 IMPLANT
ELECT REM PT RETURN 9FT ADLT (ELECTROSURGICAL) ×2
ELECTRODE REM PT RTRN 9FT ADLT (ELECTROSURGICAL) ×1 IMPLANT
GAUZE SPONGE 4X4 12PLY STRL (GAUZE/BANDAGES/DRESSINGS) ×2 IMPLANT
GLOVE BIOGEL PI IND STRL 6 (GLOVE) ×1 IMPLANT
GLOVE BIOGEL PI IND STRL 7.0 (GLOVE) ×1 IMPLANT
GLOVE BIOGEL PI IND STRL 9 (GLOVE) ×1 IMPLANT
GLOVE BIOGEL PI INDICATOR 6 (GLOVE) ×1
GLOVE BIOGEL PI INDICATOR 7.0 (GLOVE) ×1
GLOVE BIOGEL PI INDICATOR 9 (GLOVE) ×1
GLOVE SURG ORTHO 9.0 STRL STRW (GLOVE) ×2 IMPLANT
GLOVE SURG SS PI 6.0 STRL IVOR (GLOVE) ×2 IMPLANT
GLOVE SURG SS PI 6.5 STRL IVOR (GLOVE) ×2 IMPLANT
GOWN STRL REUS W/ TWL LRG LVL3 (GOWN DISPOSABLE) ×1 IMPLANT
GOWN STRL REUS W/ TWL XL LVL3 (GOWN DISPOSABLE) ×2 IMPLANT
GOWN STRL REUS W/TWL LRG LVL3 (GOWN DISPOSABLE) ×1
GOWN STRL REUS W/TWL XL LVL3 (GOWN DISPOSABLE) ×2
KIT BASIN OR (CUSTOM PROCEDURE TRAY) ×2 IMPLANT
KIT ROOM TURNOVER OR (KITS) ×2 IMPLANT
NS IRRIG 1000ML POUR BTL (IV SOLUTION) ×2 IMPLANT
PACK ORTHO EXTREMITY (CUSTOM PROCEDURE TRAY) ×2 IMPLANT
PAD ARMBOARD 7.5X6 YLW CONV (MISCELLANEOUS) ×4 IMPLANT
SPONGE GAUZE 4X4 12PLY STER LF (GAUZE/BANDAGES/DRESSINGS) ×2 IMPLANT
SPONGE LAP 18X18 X RAY DECT (DISPOSABLE) ×2 IMPLANT
STOCKINETTE IMPERVIOUS LG (DRAPES) IMPLANT
SUT ETHILON 2 0 PSLX (SUTURE) ×4 IMPLANT
TOWEL OR 17X24 6PK STRL BLUE (TOWEL DISPOSABLE) ×2 IMPLANT
TOWEL OR 17X26 10 PK STRL BLUE (TOWEL DISPOSABLE) ×2 IMPLANT
UNDERPAD 30X30 INCONTINENT (UNDERPADS AND DIAPERS) ×2 IMPLANT
WATER STERILE IRR 1000ML POUR (IV SOLUTION) ×2 IMPLANT

## 2015-07-12 NOTE — Transfer of Care (Signed)
Immediate Anesthesia Transfer of Care Note  Patient: Suzanne Allen  Procedure(s) Performed: Procedure(s): Right Transmetatarsal amputation (Right)  Patient Location: PACU  Anesthesia Type:General  Level of Consciousness: awake, alert  and sedated  Airway & Oxygen Therapy: Patient Spontanous Breathing and Patient connected to nasal cannula oxygen  Post-op Assessment: Report given to RN, Post -op Vital signs reviewed and stable, Patient moving all extremities and Patient moving all extremities X 4  Post vital signs: Reviewed and stable  Last Vitals:  Filed Vitals:   07/12/15 0635 07/12/15 1320  BP: 141/70 130/75  Pulse: 79 78  Temp: 36.6 C 36.7 C  Resp: 16 18    Complications: No apparent anesthesia complications

## 2015-07-12 NOTE — H&P (View-Only) (Signed)
Reason for Consult: Osteomyelitis right foot third toe and second metatarsal head Referring Physician: Dr. Wallene Huh Suzanne Allen is an 54 y.o. female.  HPI: Patient is a 54 year old woman with poorly controlled diabetes type 1 who is status post partial first ray amputation in [place]. Patient states she's had on and off problems with ulceration of the third toe.  Past Medical History  Diagnosis Date  . HTN (hypertension)   . Macular degeneration, bilateral   . Hypercholesteremia   . Hypothyroidism   . Stroke [facility]) ? date & 09/22/2012  . Peripheral neuropathy ([place])   . Anxiety   . Depression   . PTSD (post-traumatic stress disorder)   . 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 Fatty liver 04/04/2014  . Carpal tunnel syndrome 11/28/2014    Bilateral  . CAP (community acquired pneumonia) 03/13/2015  . Pneumonia 1993  . Diabetes mellitus type I ([place]) dx'd 1977  . Diabetic osteomyelitis ([employer])     right great toe/progress notes 09/23/2012  . Migraines 1980's    "when I was in my 20's" (03/14/2015)  . Chronic pancreatitis ([place])   . Diabetic retinopathy ([place])   . TIA (transient ischemic attack) 09/22/2012    Suzanne Allen 10/25/2012    Past Surgical History  Procedure Laterality Date  . Cesarean section  1989; 1995  . Tonsillectomy  1965  . Refractive surgery Bilateral 2013  . Total thyroidectomy  2012  . Peripherally inserted central catheter insertion Right 08/31/2012  . I&d extremity Right multiple    "big toe"  . Toe amputation Right 11/2012    "big toe"  . Tubal ligation  1995  . Eye surgery Bilateral     "hemorrhaged behind my eye"  . Picc line removal ([facility]) Right 10/12/2012    Suzanne Allen 10/12/2012    Family History  Problem Relation Age of Onset  . Depression Mother   . Alcohol abuse Father   . Alcohol abuse Sister     Social History:  reports that she has never smoked. She has never used smokeless tobacco. She reports that she does not drink alcohol or  use illicit drugs.  Allergies:  Allergies  Allergen Reactions  . Erythromycin Anaphylaxis  . Vancomycin Other (See Comments)    Red Man Syndrome    Medications: I have reviewed the patient's current medications.  Results for orders placed or performed during the hospital encounter of 07/08/15 (from the past 48 hour(s))  Hemoglobin A1c     Status: Abnormal   Collection Time: 07/08/15  6:44 PM  Result Value Ref Range   Hgb A1c MFr Bld 10.9 (H) 4.8 - 5.6 %    Comment: (NOTE)         Pre-diabetes: 5.7 - 6.4         Diabetes: >6.4         Glycemic control for adults with diabetes: <7.0    Mean Plasma Glucose 266 mg/dL    Comment: (NOTE) Performed At: [facility] [address] [place], [place] [id number] Lindon Romp MD [id number]   HIV antibody     Status: None   Collection Time: 07/08/15  6:44 PM  Result Value Ref Range   HIV Screen 4th Generation wRfx Non Reactive Non Reactive    Comment: (NOTE) Performed At: [facility] [place], [place] [id number] Lindon Romp MD [id number]   Sedimentation rate     Status: Abnormal   Collection Time: 07/08/15  6:44 PM  Result Value Ref Range   Sed Rate 97 (H) 0 - 22 mm/hr  C-reactive protein     Status: Abnormal   Collection Time: 07/08/15  6:44 PM  Result Value Ref Range   CRP 5.9 (H) <1.0 mg/dL  Prealbumin     Status: None   Collection Time: 07/08/15  6:44 PM  Result Value Ref Range   Prealbumin 20.5 18 - 38 mg/dL  VITAMIN D 25 Hydroxy (Vit-D Deficiency, Fractures)     Status: Abnormal   Collection Time: 07/08/15  6:44 PM  Result Value Ref Range   Vit D, 25-Hydroxy 8.6 (L) 30.0 - 100.0 ng/mL    Comment: (NOTE) Vitamin D deficiency has been defined by the [place] practice guideline as a level of serum 25-OH vitamin D less than 20 ng/mL (1,2). The Endocrine Society went on to further define vitamin D insufficiency as a level between 21 and 29  ng/mL (2). 1. IOM (Institute of Medicine). 2010. Dietary reference   intakes for calcium and D. [place]: The   [employer]. 2. Holick MF, Binkley [place], Bischoff-Ferrari HA, et al.   Evaluation, treatment, and prevention of vitamin D   deficiency: an Endocrine Society clinical practice   guideline. JCEM. 2011 Jul; 96(7):1911-30. Performed At: [facility] [place], [place] [id number] Lindon Romp MD [id number]   Glucose, capillary     Status: Abnormal   Collection Time: 07/08/15  9:06 PM  Result Value Ref Range   Glucose-Capillary 337 (H) 65 - 99 mg/dL  CBC     Status: Abnormal   Collection Time: 07/09/15  5:35 AM  Result Value Ref Range   WBC 11.4 (H) 4.0 - 10.5 K/uL   RBC 3.63 (L) 3.87 - 5.11 MIL/uL   Hemoglobin 10.3 (L) 12.0 - 15.0 g/dL    Comment: RESULT REPEATED AND VERIFIED   HCT 31.6 (L) 36.0 - 46.0 %   MCV 87.1 78.0 - 100.0 fL   MCH 28.4 26.0 - 34.0 pg   MCHC 32.6 30.0 - 36.0 g/dL   RDW 13.9 11.5 - 15.5 %   Platelets 383 150 - 400 K/uL  Basic metabolic panel     Status: Abnormal   Collection Time: 07/09/15  5:35 AM  Result Value Ref Range   Sodium 138 135 - 145 mmol/L   Potassium 3.7 3.5 - 5.1 mmol/L   Chloride 107 101 - 111 mmol/L   CO2 23 22 - 32 mmol/L   Glucose, Bld 183 (H) 65 - 99 mg/dL   BUN 24 (H) 6 - 20 mg/dL   Creatinine, Ser 1.06 (H) 0.44 - 1.00 mg/dL   Calcium 8.4 (L) 8.9 - 10.3 mg/dL   GFR calc non Af Amer 59 (L) >60 mL/min   GFR calc Af Amer >60 >60 mL/min    Comment: (NOTE) The eGFR has been calculated using the CKD EPI equation. This calculation has not been validated in all clinical situations. eGFR's persistently <60 mL/min signify possible Chronic Kidney Disease.    Anion gap 8 5 - 15  APTT     Status: Abnormal   Collection Time: 07/09/15  5:35 AM  Result Value Ref Range   aPTT 39 (H) 24 - 37 seconds    Comment:        IF BASELINE aPTT IS ELEVATED, SUGGEST PATIENT RISK ASSESSMENT BE USED TO  DETERMINE APPROPRIATE ANTICOAGULANT THERAPY.   Protime-INR     Status: None  Collection Time: 07/09/15  5:35 AM  Result Value Ref Range   Prothrombin Time 14.9 11.6 - 15.2 seconds   INR 1.15 0.00 - 1.49  Glucose, capillary     Status: Abnormal   Collection Time: 07/09/15  6:08 AM  Result Value Ref Range   Glucose-Capillary 167 (H) 65 - 99 mg/dL  Glucose, capillary     Status: Abnormal   Collection Time: 07/09/15 11:34 AM  Result Value Ref Range   Glucose-Capillary 153 (H) 65 - 99 mg/dL   Comment 1 Notify RN   Glucose, capillary     Status: Abnormal   Collection Time: 07/09/15  4:11 PM  Result Value Ref Range   Glucose-Capillary 271 (H) 65 - 99 mg/dL   Comment 1 Notify RN   Glucose, capillary     Status: Abnormal   Collection Time: 07/09/15 10:10 PM  Result Value Ref Range   Glucose-Capillary 161 (H) 65 - 99 mg/dL  Glucose, capillary     Status: Abnormal   Collection Time: 07/10/15  6:34 AM  Result Value Ref Range   Glucose-Capillary 102 (H) 65 - 99 mg/dL  CBC     Status: Abnormal   Collection Time: 07/10/15  6:50 AM  Result Value Ref Range   WBC 9.8 4.0 - 10.5 K/uL   RBC 3.73 (L) 3.87 - 5.11 MIL/uL   Hemoglobin 10.6 (L) 12.0 - 15.0 g/dL   HCT 32.2 (L) 36.0 - 46.0 %   MCV 86.3 78.0 - 100.0 fL   MCH 28.4 26.0 - 34.0 pg   MCHC 32.9 30.0 - 36.0 g/dL   RDW 13.8 11.5 - 15.5 %   Platelets 400 150 - 400 K/uL  Basic metabolic panel     Status: Abnormal   Collection Time: 07/10/15  6:50 AM  Result Value Ref Range   Sodium 140 135 - 145 mmol/L   Potassium 3.6 3.5 - 5.1 mmol/L   Chloride 107 101 - 111 mmol/L   CO2 25 22 - 32 mmol/L   Glucose, Bld 103 (H) 65 - 99 mg/dL   BUN 20 6 - 20 mg/dL   Creatinine, Ser 1.07 (H) 0.44 - 1.00 mg/dL   Calcium 8.5 (L) 8.9 - 10.3 mg/dL   GFR calc non Af Amer 58 (L) >60 mL/min   GFR calc Af Amer >60 >60 mL/min    Comment: (NOTE) The eGFR has been calculated using the CKD EPI equation. This calculation has not been validated in all clinical  situations. eGFR's persistently <60 mL/min signify possible Chronic Kidney Disease.    Anion gap 8 5 - 15  Glucose, capillary     Status: Abnormal   Collection Time: 07/10/15 11:17 AM  Result Value Ref Range   Glucose-Capillary 142 (H) 65 - 99 mg/dL   Comment 1 Notify RN   Glucose, capillary     Status: Abnormal   Collection Time: 07/10/15  4:14 PM  Result Value Ref Range   Glucose-Capillary 139 (H) 65 - 99 mg/dL   Comment 1 Notify RN     No results found.  Review of Systems  All other systems reviewed and are negative.  Blood pressure 121/70, pulse 85, temperature 98.4 F (36.9 C), temperature source Oral, resp. rate 18, height _0  (1.575 m), weight 70.308 kg (155 lb), SpO2 100 %. Physical Exam On examination patient has no ascending cellulitis she has a good dorsalis pedis pulse she has good motor function she has insensate neuropathy. Patient has sausage digit swelling with  ulceration down to bone Wagner grade 3 ulcer of the right foot third toe. She has no redness or swelling or tenderness to palpation around the second metatarsal head status post a well-healed partial first ray amputation. Review of the radiographs shows osteomyelitis of the second metatarsal head and shows osteomyelitis of the third toe. Brachial indices show good circulation. Hemoglobin A1c 10.9. Assessment/Plan: Assessment: Diabetic insensate neuropathy with good circulation with osteomyelitis chronic of the second metatarsal head right foot and osteomyelitis and ulceration of the third toe right foot.  Plan: I discussed that a foot salvage option would be a transmetatarsal amputation. Risks and benefits were discussed including risk of the wound not healing need for additional surgery. Patient states she understands she states she would like to proceed with foot salvage intervention we will plan for surgery Friday afternoon. Continue IV antibiotics for 24 hours postoperatively. She will need to be  nonweightbearing on the right postoperatively for at least 4 weeks.  Suzanne Allen 07/10/2015, 6:28 PM

## 2015-07-12 NOTE — Interval H&P Note (Signed)
History and Physical Interval Note:  07/12/2015 6:37 AM  [name]  has presented today for surgery, with the diagnosis of osteomyelitis right forefoot  The various methods of treatment have been discussed with the patient and family. After consideration of risks, benefits and other options for treatment, the patient has consented to  Procedure(s): Right Transmetatarsal amputation (Right) as a surgical intervention .  The patient's history has been reviewed, patient examined, no change in status, stable for surgery.  I have reviewed the patient's chart and labs.  Questions were answered to the patient's satisfaction.     DUDA,MARCUS V

## 2015-07-12 NOTE — Anesthesia Procedure Notes (Signed)
Procedure Name: LMA Insertion Date/Time: 07/12/2015 3:30 PM Performed by: Izora Gala Pre-anesthesia Checklist: Patient identified, Emergency Drugs available, Suction available and Patient being monitored Patient Re-evaluated:Patient Re-evaluated prior to inductionOxygen Delivery Method: Circle system utilized Preoxygenation: Pre-oxygenation with 100% oxygen Intubation Type: IV induction Ventilation: Mask ventilation without difficulty LMA: LMA inserted LMA Size: 4.0 Number of attempts: 1 Placement Confirmation: positive ETCO2 Tube secured with: Tape

## 2015-07-12 NOTE — Anesthesia Postprocedure Evaluation (Signed)
Anesthesia Post Note  Patient: Mellanie Altschuler  Procedure(s) Performed: Procedure(s) (LRB): Right Transmetatarsal amputation (Right)  Patient location during evaluation: PACU Anesthesia Type: General Level of consciousness: awake and alert Pain management: pain level controlled Vital Signs Assessment: post-procedure vital signs reviewed and stable Respiratory status: spontaneous breathing and respiratory function stable Cardiovascular status: blood pressure returned to baseline and stable Postop Assessment: no signs of nausea or vomiting Anesthetic complications: no    Last Vitals:  Filed Vitals:   07/12/15 1320 07/12/15 1605  BP: 130/75   Pulse: 78   Temp: 36.7 C 36.7 C  Resp: 18     Last Pain:  Filed Vitals:   07/12/15 1610  PainSc: 0-No pain                 Moiz Ryant S

## 2015-07-12 NOTE — Op Note (Signed)
07/08/2015 - 07/12/2015  3:44 PM  PATIENT:  [name]    PRE-OPERATIVE DIAGNOSIS:  osteomyelitis right forefoot  POST-OPERATIVE DIAGNOSIS:  Same  PROCEDURE:  Right Transmetatarsal amputation  SURGEON:  Newt Minion, MD  PHYSICIAN ASSISTANT:None ANESTHESIA:   General  PREOPERATIVE INDICATIONS:  Holleigh Coleson is a  54 y.o. female with a diagnosis of osteomyelitis right forefoot who failed conservative measures and elected for surgical management.    The risks benefits and alternatives were discussed with the patient preoperatively including but not limited to the risks of infection, bleeding, nerve injury, cardiopulmonary complications, the need for revision surgery, among others, and the patient was willing to proceed.  OPERATIVE IMPLANTS: None  OPERATIVE FINDINGS: Severely calcified vessels  OPERATIVE PROCEDURE: Patient was brought to the operating room and underwent a general anesthetic. After adequate levels anesthesia obtained patient's right lower extremity was prepped using DuraPrep draped into a sterile field. A timeout was called. A fishmouth incision was made through the forefoot. A transmetatarsal amputation was performed electrocautery was used for hemostasis there is no deep abscess no necrotic tissue there was severe calcified vessels. The wound was then closed using 2-0 nylon after irrigation. A sterile compressive dressing was applied. Patient was extubated taken to the PACU in stable condition.

## 2015-07-12 NOTE — Progress Notes (Signed)
CBG, done at 1442- 139

## 2015-07-12 NOTE — Progress Notes (Signed)
[facility]                                                                              Patient Demographics  Suzanne Allen, is a 54 y.o. female, DOB - 03/10/1961, [id number]  Admit date - 07/08/2015   Admitting Physician Waldemar Dickens, MD  Outpatient Primary MD for the patient is Barrie Lyme, FNP  LOS - 4   Chief Complaint  Patient presents with  . Leg Pain      HPI on 07/08/2015 by Dr. Linna Darner Suzanne Allen is a 54 y.o. female  Right third toe swollen, painful, purplish black. Started 2 days ago. Patient states that prior to this toe is normal. Problem is constant. Getting worse. Has not tried anything for her symptoms to make them better. Worse with ambulation. Now with foul odor. Denies any fevers, rash, nausea, vomiting, chills, fatigue, decreased appetite. Some radiation of the pain up her leg.  States that she is compliant with her insulin regimen which includes NovoLog 20 units 4 times a day and Lantus 40 units twice a day. States that her blood sugar levels are typically in the mid 100 range.   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  Left TBI borderline, abnormal. -Dr. Sharol Given, orthopedics consulted and appreciated, plan for surgery today 07/11/2015  Uncontrolled Diabetes mellitus, type I -Continue Lantus, insulin sliding scale and CBG monitoring -Hemoglobin A1c 10.9 -Diabetes coordinator consulted  for education  Hyperlipidemia -Continue statin  Hypothyroidism -Continue Synthroid  Depression/anxiety -Continue Cymbalta, Lamictal, Klonopin  Normocytic anemia -Hemoglobin 11-12, currently 11.6 -Continue to monitor CBC  CKD, stage III -Creatinine currently stable, 0.93 -continue to monitor BMP  Code Status: Full  Family Communication: None at bedside  Disposition Plan: Admitted. Surgery planned for  Today 07/11/2015  Time Spent in minutes   30 minutes  Procedures  ABI  Consults   Orthopedics  DVT Prophylaxis  heparin  Lab Results  Component Value Date   PLT 443* 07/12/2015    Medications  Scheduled Meds: . ceFEPime (MAXIPIME) IV  2 g Intravenous Q12H  . clonazePAM  0.5 mg Oral QHS  . doxycycline  100 mg Oral Q12H  . DULoxetine  60 mg Oral Daily  . feeding supplement (PRO-STAT SUGAR FREE 64)  30 mL Oral Daily  . heparin  5,000 Units Subcutaneous 3 times per day  . insulin aspart  0-5 Units Subcutaneous QHS  . insulin aspart  0-9 Units Subcutaneous TID WC  . insulin aspart  8 Units Subcutaneous TID WC  . insulin glargine  40 Units Subcutaneous BID  . lamoTRIgine  150 mg Oral Daily  . levothyroxine  137 mcg Oral QAC breakfast  . metronidazole  500  mg Intravenous Q8H  . rosuvastatin  40 mg Oral q1800   Continuous Infusions: . sodium chloride 75 mL/hr at 07/08/15 1848  . sodium chloride     PRN Meds:.ibuprofen, ondansetron **OR** ondansetron (ZOFRAN) IV  Antibiotics    Anti-infectives    Start     Dose/Rate Route Frequency Ordered Stop   07/09/15 0500  vancomycin (VANCOCIN) IVPB 750 mg/150 ml premix  Status:  Discontinued     750 mg 75 mL/hr over 120 Minutes Intravenous Every 12 hours 07/08/15 1559 07/08/15 1741   07/08/15 2000  doxycycline (VIBRA-TABS) tablet 100 mg     100 mg Oral Every 12 hours 07/08/15 1741     07/08/15 1900  metroNIDAZOLE (FLAGYL) IVPB 500 mg     500 mg 100 mL/hr over 60 Minutes Intravenous Every 8 hours 07/08/15 1741      07/08/15 1830  ceFEPIme (MAXIPIME) 2 g in dextrose 5 % 50 mL IVPB     2 g 100 mL/hr over 30 Minutes Intravenous Every 12 hours 07/08/15 1801     07/08/15 1500  vancomycin (VANCOCIN) 1,250 mg in sodium chloride 0.9 % 250 mL IVPB  Status:  Discontinued     1,250 mg 125 mL/hr over 120 Minutes Intravenous  Once 07/08/15 1457 07/08/15 1741      Subjective:   Suzanne Allen seen and examined today.  Patient denies chest pain, shortness of breath, abdominal pain.  Patient is ready for her surgery to be over.   Objective:   Filed Vitals:   07/11/15 0459 07/11/15 1300 07/11/15 2210 07/12/15 0635  BP: 122/64 126/74 125/65 141/70  Pulse: 73 79 76 79  Temp: 97.7 F (36.5 C) 98.4 F (36.9 C) 98.2 F (36.8 C) 97.9 F (36.6 C)  TempSrc: Oral  Oral Oral  Resp: 18 18 20 16   Height:      Weight:      SpO2: 97% 99% 99% 96%    Wt Readings from Last 3 Encounters:  07/08/15 70.308 kg (155 lb)  06/17/15 74.118 kg (163 lb 6.4 oz)  05/27/15 73.483 kg (162 lb)     Intake/Output Summary (Last 24 hours) at 07/12/15 1202 Last data filed at 07/11/15 2300  Gross per 24 hour  Intake    480 ml  Output      0 ml  Net    480 ml    Exam  General: Well developed, well nourished, NAD  HEENT: NCAT,mucous membranes moist.   Cardiovascular: S1 S2 auscultated, RRR, no murmurs  Respiratory: Clear to auscultation bilaterally  Abdomen: Soft, nontender, nondistended, + bowel sounds  Extremities: warm dry without cyanosis clubbing or edema, TTP RLE  Neuro: AAOx3, nonfocal  Skin: Right foot currently wrapped however TTP  Psych: Flat affect  Data Review   Micro Results Recent Results (from the past 240 hour(s))  Blood culture (routine x 2)     Status: None (Preliminary result)   Collection Time: 07/08/15  3:30 PM  Result Value Ref Range Status   Specimen Description BLOOD LEFT ARM  Final   Special Requests IN PEDIATRIC BOTTLE 4CC  Final   Culture NO GROWTH 4 DAYS  Final   Report Status  PENDING  Incomplete  Blood culture (routine x 2)     Status: None (Preliminary result)   Collection Time: 07/08/15  3:44 PM  Result Value Ref Range Status   Specimen Description BLOOD LEFT HAND  Final   Special Requests BOTTLES DRAWN AEROBIC ONLY 5CC  Final  Culture NO GROWTH 4 DAYS  Final   Report Status PENDING  Incomplete  MRSA PCR Screening     Status: None   Collection Time: 07/11/15  9:43 PM  Result Value Ref Range Status   MRSA by PCR NEGATIVE NEGATIVE Final    Comment:        The GeneXpert MRSA Assay (FDA approved for NASAL specimens only), is one component of a comprehensive MRSA colonization surveillance program. It is not intended to diagnose MRSA infection nor to guide or monitor treatment for MRSA infections.     Radiology Reports Dg Foot Complete Right  07/08/2015  CLINICAL DATA:  Foot would add diabetic patient. History of prior amputation. Initial encounter. EXAM: RIGHT FOOT COMPLETE - 3+ VIEW COMPARISON:  Plain films the right great toe 04/04/2013. FINDINGS: The patient is status post amputation at the level of the mid first metatarsal. Bony destructive change is seen in the head of the second metatarsal with sclerosis throughout the diaphysis of the second metatarsal. Soft tissues of the foot are swollen. Calcaneal spurring is noted. No fracture dislocation. IMPRESSION: Findings most consistent with chronic osteomyelitis throughout the diaphysis and head of the second metatarsal. Status post amputation at the level of the mid first metatarsal. Diffuse soft tissue swelling. Electronically Signed   By: Inge Rise M.D.   On: 07/08/2015 13:10    CBC  Recent Labs Lab 07/08/15 1257 07/09/15 0535 07/10/15 0650 07/11/15 0432 07/12/15 0930  WBC 15.5* 11.4* 9.8 9.2 11.0*  HGB 13.0 10.3* 10.6* 10.5* 11.6*  HCT 38.7 31.6* 32.2* 31.8* 35.8*  PLT 400 383 400 383 443*  MCV 87.8 87.1 86.3 86.9 86.9  MCH 29.5 28.4 28.4 28.7 28.2  MCHC 33.6 32.6 32.9 33.0 32.4    RDW 14.1 13.9 13.8 13.8 13.7    Chemistries   Recent Labs Lab 07/08/15 1257 07/09/15 0535 07/10/15 0650 07/11/15 0432 07/12/15 0930  NA 137 138 140 140 139  K 4.5 3.7 3.6 3.5 4.4  CL 102 107 107 109 107  CO2 22 23 25 23 23   GLUCOSE 254* 183* 103* 79 196*  BUN 26* 24* 20 26* 19  CREATININE 1.18* 1.06* 1.07* 1.16* 0.93  CALCIUM 9.3 8.4* 8.5* 8.4* 8.7*   ------------------------------------------------------------------------------------------------------------------ estimated creatinine clearance is 64.3 mL/min (by C-G formula based on Cr of 0.93). ------------------------------------------------------------------------------------------------------------------ No results for input(s): HGBA1C in the last 72 hours. ------------------------------------------------------------------------------------------------------------------ No results for input(s): CHOL, HDL, LDLCALC, TRIG, CHOLHDL, LDLDIRECT in the last 72 hours. ------------------------------------------------------------------------------------------------------------------ No results for input(s): TSH, T4TOTAL, T3FREE, THYROIDAB in the last 72 hours.  Invalid input(s): FREET3 ------------------------------------------------------------------------------------------------------------------ No results for input(s): VITAMINB12, FOLATE, FERRITIN, TIBC, IRON, RETICCTPCT in the last 72 hours.  Coagulation profile  Recent Labs Lab 07/09/15 0535  INR 1.15    No results for input(s): DDIMER in the last 72 hours.  Cardiac Enzymes No results for input(s): CKMB, TROPONINI, MYOGLOBIN in the last 168 hours.  Invalid input(s): CK ------------------------------------------------------------------------------------------------------------------ Invalid input(s): POCBNP    Carolos Fecher D.O. on 07/12/2015 at 12:02 PM  Between 7am to 7pm - Pager - [phone]  After 7pm go to [email or web address] - password [email or web address]  And look  for the night coverage person covering for me after hours  [facility] Office  [phone]

## 2015-07-12 NOTE — Anesthesia Preprocedure Evaluation (Addendum)
Anesthesia Evaluation  Patient identified by MRN, date of birth, ID band Patient awake    Reviewed: Allergy & Precautions, NPO status , Patient's Chart, lab work & pertinent test results  Airway Mallampati: II  TM Distance: >3 FB Neck ROM: Full    Dental no notable dental hx.    Pulmonary neg pulmonary ROS,    Pulmonary exam normal breath sounds clear to auscultation       Cardiovascular hypertension, Pt. on medications Normal cardiovascular exam Rhythm:Regular Rate:Normal     Neuro/Psych TIACVA negative psych ROS   GI/Hepatic negative GI ROS, Neg liver ROS,   Endo/Other  diabetes, Insulin DependentHypothyroidism   Renal/GU negative Renal ROS  negative genitourinary   Musculoskeletal negative musculoskeletal ROS (+)   Abdominal   Peds negative pediatric ROS (+)  Hematology negative hematology ROS (+)   Anesthesia Other Findings   Reproductive/Obstetrics negative OB ROS                           Anesthesia Physical Anesthesia Plan  ASA: III  Anesthesia Plan: General   Post-op Pain Management:    Induction: Intravenous  Airway Management Planned: LMA  Additional Equipment:   Intra-op Plan:   Post-operative Plan: Extubation in OR  Informed Consent: I have reviewed the patients History and Physical, chart, labs and discussed the procedure including the risks, benefits and alternatives for the proposed anesthesia with the patient or authorized representative who has indicated his/her understanding and acceptance.   Dental advisory given  Plan Discussed with: CRNA and Surgeon  Anesthesia Plan Comments:         Anesthesia Quick Evaluation

## 2015-07-13 LAB — CBC
HCT: 30.9 % — ABNORMAL LOW (ref 36.0–46.0)
Hemoglobin: 10.7 g/dL — ABNORMAL LOW (ref 12.0–15.0)
MCH: 29.3 pg (ref 26.0–34.0)
MCHC: 34.6 g/dL (ref 30.0–36.0)
MCV: 84.7 fL (ref 78.0–100.0)
Platelets: 356 10*3/uL (ref 150–400)
RBC: 3.65 MIL/uL — ABNORMAL LOW (ref 3.87–5.11)
RDW: 14.4 % (ref 11.5–15.5)
WBC: 13.4 10*3/uL — ABNORMAL HIGH (ref 4.0–10.5)

## 2015-07-13 LAB — CULTURE, BLOOD (ROUTINE X 2)
Culture: NO GROWTH
Culture: NO GROWTH

## 2015-07-13 LAB — GLUCOSE, CAPILLARY
Glucose-Capillary: 197 mg/dL — ABNORMAL HIGH (ref 65–99)
Glucose-Capillary: 151 mg/dL — ABNORMAL HIGH (ref 65–99)

## 2015-07-13 LAB — BASIC METABOLIC PANEL
Anion gap: 10 (ref 5–15)
BUN: 16 mg/dL (ref 6–20)
CO2: 23 mmol/L (ref 22–32)
Calcium: 8.5 mg/dL — ABNORMAL LOW (ref 8.9–10.3)
Chloride: 107 mmol/L (ref 101–111)
Creatinine, Ser: 0.85 mg/dL (ref 0.44–1.00)
GFR calc Af Amer: >60 mL/min (ref >60)
GFR calc non Af Amer: >60 mL/min (ref >60)
Glucose, Bld: 239 mg/dL — ABNORMAL HIGH (ref 65–99)
Potassium: 5.2 mmol/L — ABNORMAL HIGH (ref 3.5–5.1)
Sodium: 140 mmol/L (ref 135–145)

## 2015-07-13 MED ORDER — OXYCODONE HCL 5 MG PO TABS: 5.0000 mg | ORAL_TABLET | ORAL | Status: DC | PRN

## 2015-07-13 MED ORDER — ONDANSETRON HCL 4 MG PO TABS: 4.0000 mg | ORAL_TABLET | Freq: Four times a day (QID) | ORAL | Status: DC | PRN

## 2015-07-13 MED ORDER — DOXYCYCLINE HYCLATE 100 MG PO TABS: 100.0000 mg | ORAL_TABLET | Freq: Two times a day (BID) | ORAL | Status: DC

## 2015-07-13 MED ORDER — PRO-STAT SUGAR FREE PO LIQD: 30.0000 mL | Freq: Every day | ORAL | Status: DC

## 2015-07-13 NOTE — Discharge Summary (Signed)
Physician Discharge Summary  Suzanne Allen [id number] DOB: 27-Jun-1961 DOA: 07/08/2015  PCP: Barrie Lyme, FNP  Admit date: 07/08/2015 Discharge date: 07/13/2015  Time spent: 45 minutes  Recommendations for Outpatient Follow-up:  Patient will be discharged to home.  Patient will need to follow up with primary care provider within one week of discharge, repeat CBC and BMP in 1 week.  Follow up with Dr. Sharol Given in 2 weeks..  Patient should continue medications as prescribed.  Patient should follow a  Carb modified diet. Patient may weight bare as tolerated.  Discharge Diagnoses:  Principal Problem:   Diabetic osteomyelitis ([place]) Active Problems:   Hypothyroidism   HLD (hyperlipidemia)   Osteomyelitis ([place])   Poorly controlled diabetes mellitus ([place])   Depression with anxiety   Lactic acid acidosis   Discharge Condition: Stable  Diet recommendation:   Carb modified   Filed Weights   07/08/15 1151  Weight: 70.308 kg (155 lb)    History of present illness:  on 07/08/2015 by Dr. Linna Darner Suzanne Allen is a 54 y.o. female  Right third toe swollen, painful, purplish black. Started 2 days ago. Patient states that prior to this toe is normal. Problem is constant. Getting worse. Has not tried anything for her symptoms to make them better. Worse with ambulation. Now with foul odor. Denies any fevers, rash, nausea, vomiting, chills, fatigue, decreased appetite. Some radiation of the pain up her leg.  States that she is compliant with her insulin regimen which includes NovoLog 20 units 4 times a day and Lantus 40 units twice a day. States that her blood sugar levels are typically in the mid 100 range.   Hospital Course:  Sepsis secondary to Osteomyelitis of the right third toe -Patient did have one toe amputation on the same foot -Upon admission, patient had leukocytosis with mild tachycardia -Likely secondary to uncontrolled diabetes -Orthopedic consultation appreciated -Lactic  acid was 2.97 upon admission however 1.58 after fluids -Right foot x-ray showed findings consistent with chronic osteomyelitis throughout the diaphysis and the head of the second metatarsal -Initially placed on Flagyl, cefepime, doxycycline, patient has allergy to vancomycin -ABI of the right 1.2, left 1.1. Left TBI borderline, abnormal. -Dr. 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 Sharol Given in 2 weeks -Will discharge with doxy and pain medication  Uncontrolled Diabetes mellitus, type I -During hospitalization, placed on Lantus, insulin sliding scale and CBG monitoring -Hemoglobin A1c 10.9 -Diabetes coordinator consulted for education -Continue home regimen at discharge  Hyperlipidemia -Continue statin  Hypothyroidism -Continue Synthroid  Depression/anxiety -Continue Cymbalta, Lamictal, Klonopin  Normocytic anemia -Hemoglobin 11-12, currently 10.7  -Repeat CBC in one week  AKI vs CKD, stage III -Creatinine currently stable, 0.85 -continue to monitor BMP  Procedures  ABI  Consults  Orthopedics  Discharge Exam: Filed Vitals:   07/12/15 2251 07/13/15 0645  BP: 119/64 145/77  Pulse: 72 86  Temp: 97.8 F (36.6 C) 98 F (36.7 C)  Resp: 18 18   Exam  General: Well developed, well nourished, NAD  HEENT: NCAT,mucous membranes moist.   Cardiovascular: S1 S2 auscultated, RRR, no murmurs  Respiratory: Clear to auscultation bilaterally  Abdomen: Soft, nontender, nondistended, + bowel sounds  Extremities: warm dry without cyanosis clubbing or edema, TTP RLE  Neuro: AAOx3, nonfocal  Skin: Right foot currently wrapped  Psych: Flat affect  Discharge Instructions      Discharge Instructions  Discharge instructions    Complete by:  As directed   Patient will be discharged to home.  Patient will need to follow up  with primary care provider within one week of discharge, repeat CBC and BMP in 1 week.  Follow up with Dr. Sharol Given in 2 weeks..  Patient should continue medications as prescribed.  Patient should follow a  Carb modified diet. Patient may weight bare as tolerated.            Medication List    STOP taking these medications        doxycycline 100 MG capsule  Commonly known as:  VIBRAMYCIN  Replaced by:  doxycycline 100 MG tablet     furosemide 20 MG tablet  Commonly known as:  LASIX      TAKE these medications        clonazePAM 0.5 MG tablet  Commonly known as:  KLONOPIN  Take 1 tablet (0.5 mg total) by mouth at bedtime.     CRESTOR 40 MG tablet  Generic drug:  rosuvastatin  Take 1 tablet by mouth daily     doxycycline 100 MG tablet  Commonly known as:  VIBRA-TABS  Take 1 tablet (100 mg total) by mouth every 12 (twelve) hours.     DULoxetine 60 MG capsule  Commonly known as:  CYMBALTA  Take 1 capsule (60 mg total) by mouth daily.     feeding supplement (PRO-STAT SUGAR FREE 64) Liqd  Take 30 mLs by mouth daily.     folic acid 1 MG tablet  Commonly known as:  FOLVITE  Take 1 mg by mouth daily.     ibuprofen 600 MG tablet  Commonly known as:  ADVIL,MOTRIN  Take 1 tablet (600 mg total) by mouth 3 (three) times daily.     insulin aspart 100 UNIT/ML injection  Commonly known as:  novoLOG  Inject 20 Units into the skin 3 (three) times daily before meals.     insulin glargine 100 UNIT/ML injection  Commonly known as:  LANTUS  Inject 40 Units into the skin 2 (two) times daily.     lamoTRIgine 150 MG tablet  Commonly known as:  LAMICTAL  Take 1 tablet (150 mg total) by mouth daily.     levothyroxine 137 MCG tablet  Commonly known as:  SYNTHROID, LEVOTHROID  Take 137 mcg by mouth daily.     lisinopril-hydrochlorothiazide 20-25 MG tablet  Commonly known as:  PRINZIDE,ZESTORETIC  Take 1 tablet by mouth every morning.     ondansetron 4 MG tablet  Commonly known as:   ZOFRAN  Take 1 tablet (4 mg total) by mouth every 6 (six) hours as needed for nausea.     oxyCODONE 5 MG immediate release tablet  Commonly known as:  Oxy IR/ROXICODONE  Take 1-2 tablets (5-10 mg total) by mouth every 3 (three) hours as needed for breakthrough pain.       Allergies  Allergen Reactions  . Erythromycin Anaphylaxis  . Vancomycin Other (See Comments)    Red Man Syndrome   Follow-up Information    Follow up with DUDA,MARCUS V, MD In 2 weeks.   Specialty:  Orthopedic Surgery   Contact information:   [place] [address] [phone]       Follow up with Barrie Lyme, FNP. Schedule an appointment as soon as possible for a visit in 1 week.   Specialty:  Nurse Practitioner   Why:  Hospital follow up   Contact information:  [address] [place] [address] [phone]        The results of significant diagnostics from this hospitalization (including imaging, microbiology, ancillary and laboratory) are listed below for reference.    Significant Diagnostic Studies: Dg Foot Complete Right  07-29-15  CLINICAL DATA:  Foot would add diabetic patient. History of prior amputation. Initial encounter. EXAM: RIGHT FOOT COMPLETE - 3+ VIEW COMPARISON:  Plain films the right great toe 04/04/2013. FINDINGS: The patient is status post amputation at the level of the mid first metatarsal. Bony destructive change is seen in the head of the second metatarsal with sclerosis throughout the diaphysis of the second metatarsal. Soft tissues of the foot are swollen. Calcaneal spurring is noted. No fracture dislocation. IMPRESSION: Findings most consistent with chronic osteomyelitis throughout the diaphysis and head of the second metatarsal. Status post amputation at the level of the mid first metatarsal. Diffuse soft tissue swelling. Electronically Signed   By: Inge Rise M.D.   On: 07/29/15 13:10    Microbiology: Recent Results (from  the past 240 hour(s))  Blood culture (routine x 2)     Status: None   Collection Time: 2015-07-29  3:30 PM  Result Value Ref Range Status   Specimen Description BLOOD LEFT ARM  Final   Special Requests IN PEDIATRIC BOTTLE 4CC  Final   Culture NO GROWTH 5 DAYS  Final   Report Status 07/13/2015 FINAL  Final  Blood culture (routine x 2)     Status: None   Collection Time: 29-Jul-2015  3:44 PM  Result Value Ref Range Status   Specimen Description BLOOD LEFT HAND  Final   Special Requests BOTTLES DRAWN AEROBIC ONLY 5CC  Final   Culture NO GROWTH 5 DAYS  Final   Report Status 07/13/2015 FINAL  Final  MRSA PCR Screening     Status: None   Collection Time: 07/11/15  9:43 PM  Result Value Ref Range Status   MRSA by PCR NEGATIVE NEGATIVE Final    Comment:        The GeneXpert MRSA Assay (FDA approved for NASAL specimens only), is one component of a comprehensive MRSA colonization surveillance program. It is not intended to diagnose MRSA infection nor to guide or monitor treatment for MRSA infections.      Labs: Basic Metabolic Panel:  Recent Labs Lab 07/09/15 0535 07/10/15 0650 07/11/15 0432 07/12/15 0930 07/13/15 0545  NA 138 140 140 139 140  K 3.7 3.6 3.5 4.4 5.2*  CL 107 107 109 107 107  CO2 23 25 23 23 23   GLUCOSE 183* 103* 79 196* 239*  BUN 24* 20 26* 19 16  CREATININE 1.06* 1.07* 1.16* 0.93 0.85  CALCIUM 8.4* 8.5* 8.4* 8.7* 8.5*   Liver Function Tests: No results for input(s): AST, ALT, ALKPHOS, BILITOT, PROT, ALBUMIN in the last 168 hours. No results for input(s): LIPASE, AMYLASE in the last 168 hours. No results for input(s): AMMONIA in the last 168 hours. CBC:  Recent Labs Lab 07/09/15 0535 07/10/15 0650 07/11/15 0432 07/12/15 0930 07/13/15 0545  WBC 11.4* 9.8 9.2 11.0* 13.4*  HGB 10.3* 10.6* 10.5* 11.6* 10.7*  HCT 31.6* 32.2* 31.8* 35.8* 30.9*  MCV 87.1 86.3 86.9 86.9 84.7  PLT 383 400 383 443* 356   Cardiac Enzymes: No results for input(s): CKTOTAL,  CKMB, CKMBINDEX, TROPONINI in the last 168 hours. BNP: BNP (last 3 results)  Recent Labs  03/13/15 2033  BNP 132.0*    ProBNP (last 3 results)  No results for input(s): PROBNP in the last 8760 hours.  CBG:  Recent Labs Lab 07/12/15 1606 07/12/15 1805 07/12/15 2248 07/13/15 0653 07/13/15 1050  GLUCAP 123* 127* 207* 197* 151*       Signed:  Cristal Ford  [facility] 07/13/2015, 12:22 PM

## 2015-07-13 NOTE — Clinical Social Work Note (Signed)
CSW received referral for SNF.  Pt is not recommending any follow up.  CSW to sign off please re-consult if social work needs arise.  Jones Broom. [place], MSW, [place]

## 2015-07-13 NOTE — Progress Notes (Signed)
Patient ID: Suzanne Allen, female   DOB: 1960/09/27, 54 y.o.   MRN: [id number] Awake and alert.  Comfortable.  Tolerated surgery well on right foot.  Dressing clean and dry.  Can be up with therapy with WBAT thru right heel in [place] shoe.

## 2015-07-13 NOTE — Progress Notes (Signed)
Discharge instruction gave to patient and all questions answered. Patient is ready to go.

## 2015-07-13 NOTE — Evaluation (Signed)
Physical Therapy Evaluation Patient Details Name: Suzanne Allen MRN: [id number] DOB: 1960-10-27 Today's Date: 07/13/2015   History of Present Illness  patient admitted for Right Transmetatarsal amputation due to osteomyelitis.  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  No further PT needs identified.  Will sign off.    Follow Up Recommendations No PT follow up    Equipment Recommendations  None recommended by PT    Recommendations for Other Services       Precautions / Restrictions Precautions Required Braces or Orthoses: Other Brace/Splint Other Brace/Splint: darco shoe Restrictions Other Position/Activity Restrictions: WBAT with darco shoe per Dr. Rush Farmer notes      Mobility  Bed Mobility Overal bed mobility: Independent                Transfers Overall transfer level: Modified independent Equipment used: Rolling walker (2 wheeled)                Ambulation/Gait Ambulation/Gait assistance: Modified independent (Device/Increase time) Ambulation Distance (Feet): 50 Feet Assistive device: Rolling walker (2 wheeled) Gait Pattern/deviations: Step-through pattern     General Gait Details: weight bearing through heel in darco shoe  Stairs Stairs: Yes Stairs assistance: Modified independent (Device/Increase time) Stair Management: One rail Right;Forwards Number of Stairs: 2    Wheelchair Mobility    Modified Rankin (Stroke Patients Only)       Balance Overall balance assessment: No apparent balance deficits (not formally assessed)                                           Pertinent Vitals/Pain Pain Assessment: No/denies pain    Home Living Family/patient expects to be discharged to:: Private residence Living Arrangements: Parent;Children Available Help at Discharge: Family;Available 24  hours/day Type of Home: House Home Access: Stairs to enter Entrance Stairs-Rails: Right Entrance Stairs-Number of Steps: 2 Home Layout: One level Home Equipment: Walker - 2 wheels      Prior Function Level of Independence: Independent               Hand Dominance        Extremity/Trunk Assessment   Upper Extremity Assessment: Generalized weakness           Lower Extremity Assessment: Generalized weakness         Communication   Communication: No difficulties  Cognition Arousal/Alertness: Awake/alert Behavior During Therapy: WFL for tasks assessed/performed Overall Cognitive Status: Within Functional Limits for tasks assessed                      General Comments      Exercises        Assessment/Plan    PT Assessment Patent does not need any further PT services  PT Diagnosis Difficulty walking   PT Problem List    PT Treatment Interventions     PT Goals (Current goals can be found in the Care Plan section) Acute Rehab PT Goals PT Goal Formulation: All assessment and education complete, DC therapy    Frequency     Barriers to discharge        Co-evaluation               End of Session Equipment Utilized During Treatment: Other (comment) (darco shoe right) Activity Tolerance:  Patient tolerated treatment well;No increased pain             Time: 1148-1200 PT Time Calculation (min) (ACUTE ONLY): 12 min   Charges:   PT Evaluation $Initial PT Evaluation Tier I: 1 Procedure     PT G CodesShanna Cisco 07/13/2015, 12:01 PM 07/13/2015 [place], [place]

## 2015-07-13 NOTE — Progress Notes (Signed)
Orthopedic Tech Progress Note Patient Details:  Suzanne Allen Aug 08, 1960 [id number]  Ortho Devices Type of Ortho Device: Darco shoe Ortho Device/Splint Interventions: Application   [name] 07/13/2015, 11:35 AM

## 2015-07-13 NOTE — Discharge Instructions (Signed)
Keep your right foot dressing clean and dry.

## 2015-07-16 ENCOUNTER — Encounter (HOSPITAL_COMMUNITY): Payer: Self-pay | Admitting: Orthopedic Surgery

## 2015-08-27 ENCOUNTER — Encounter (HOSPITAL_COMMUNITY): Payer: Self-pay | Admitting: Psychiatry

## 2015-08-27 ENCOUNTER — Ambulatory Visit (INDEPENDENT_AMBULATORY_CARE_PROVIDER_SITE_OTHER): Payer: Medicaid Other | Admitting: Psychiatry

## 2015-08-27 VITALS — BP 136/82 | HR 84 | Ht 62.0 in | Wt 164.0 lb

## 2015-08-27 DIAGNOSIS — F330 Major depressive disorder, recurrent, mild: Secondary | ICD-10-CM | POA: Diagnosis not present

## 2015-08-27 DIAGNOSIS — F4310 Post-traumatic stress disorder, unspecified: Secondary | ICD-10-CM | POA: Diagnosis not present

## 2015-08-27 MED ORDER — DULOXETINE HCL 60 MG PO CPEP: 60.0000 mg | ORAL_CAPSULE | Freq: Every day | ORAL | Status: DC

## 2015-08-27 MED ORDER — CLONAZEPAM 0.5 MG PO TABS: 0.5000 mg | ORAL_TABLET | Freq: Every day | ORAL | Status: DC

## 2015-08-27 NOTE — Progress Notes (Signed)
[name] [phone] Progress Note  Suzanne Allen [id number] y.o.  08/27/2015 11:33 AM  Chief Complaint:  I had a surgical procedure.  I was sad but am feeling better.  I am no longer taking Lamictal.      History of Present Illness: Suzanne Allen came for her appointment.  Recently she has" of metatarsal bone due to diabetic osteo-mellitus.  She was very sad and disappointed and admitted feeling depressed but now she is doing better.  Patient used to do dancing and she is very concerned about her physical appearance especially feet.  Patient told it was a difficult decision for her to have metatarsal amputation but she has no other choice.  She admitted her pain is better.  She stayed in the hospital for 10 days.  She is no longer taking Lamictal because she has noticed it was causing more irritability and anger issues.  It was prescribed for neuropathy pain by neurologist which helped some of the neuropathy but she has noticed change in her personality.  Since stop the Lamictal she is more calm and pleasant.  She sleeping good.  Sometimes she has nightmares and flashback but she denies any feeling of hopelessness or worthlessness.  She denies any crying spells or any suicidal thoughts.  Her appetite is okay.  She is pleased that her blood sugar is much better.  Her hemoglobin A1c is dropped from 15-8.  She is seeing Dr. Soyla Murphy for the measurement of diabetes.  Patient denies drinking or using any illegal substances.  She denies any paranoia or any hallucination.  She wants to continue Klonopin and Cymbalta.  Her appetite is okay.  Her vitals are stable.  Suicidal Ideation: No Plan Formed: No Patient has means to carry out plan: No  Homicidal Ideation: No Plan Formed: No Patient has means to carry out plan: No  Review of Systems  Constitutional: Negative.   Eyes: Positive for redness.  Musculoskeletal: Negative.        Amputation Metatarsal bone  Skin: Negative for itching and rash.   Neurological: Positive for tingling and sensory change. Negative for tremors and headaches.    Psychiatric: Agitation: No Hallucination: No Depressed Mood: No Insomnia: No Hypersomnia: No Altered Concentration: No Feels Worthless: No Grandiose Ideas: No Belief In Special Powers: No New/Increased Substance Abuse: No Compulsions: No  Neurologic: Headache: No Seizure: No Paresthesias: Diabetic neuropathy in both feet  Medical History:   Patient has history of hypothyroidism, insulin-dependent diabetes, diabetic neuropathy, hypertension macular degeneration.    Outpatient Encounter Prescriptions as of 08/27/2015  Medication Sig  . clonazePAM (KLONOPIN) 0.5 MG tablet Take 1 tablet (0.5 mg total) by mouth at bedtime.  . CRESTOR 40 MG tablet Take 1 tablet by mouth daily  . DULoxetine (CYMBALTA) 60 MG capsule Take 1 capsule (60 mg total) by mouth daily.  . folic acid (FOLVITE) 1 MG tablet Take 1 mg by mouth daily.  Marland Kitchen ibuprofen (ADVIL,MOTRIN) 600 MG tablet Take 1 tablet (600 mg total) by mouth 3 (three) times daily. (Patient not taking: Reported on 07/08/2015)  . insulin aspart (NOVOLOG) 100 UNIT/ML injection Inject 20 Units into the skin 3 (three) times daily before meals.   . insulin glargine (LANTUS) 100 UNIT/ML injection Inject 40 Units into the skin 2 (two) times daily.  Marland Kitchen levothyroxine (SYNTHROID, LEVOTHROID) 137 MCG tablet Take 137 mcg by mouth daily.    Marland Kitchen lisinopril-hydrochlorothiazide (PRINZIDE,ZESTORETIC) 20-25 MG tablet Take 1 tablet by mouth every morning.  . [DISCONTINUED] Amino Acids-Protein Hydrolys (  FEEDING SUPPLEMENT, PRO-STAT SUGAR FREE 64,) LIQD Take 30 mLs by mouth daily.  . [DISCONTINUED] clonazePAM (KLONOPIN) 0.5 MG tablet Take 1 tablet (0.5 mg total) by mouth at bedtime.  . [DISCONTINUED] doxycycline (VIBRA-TABS) 100 MG tablet Take 1 tablet (100 mg total) by mouth every 12 (twelve) hours.  . [DISCONTINUED] DULoxetine (CYMBALTA) 60 MG capsule Take 1 capsule (60 mg  total) by mouth daily.  . [DISCONTINUED] lamoTRIgine (LAMICTAL) 150 MG tablet Take 1 tablet (150 mg total) by mouth daily.  . [DISCONTINUED] ondansetron (ZOFRAN) 4 MG tablet Take 1 tablet (4 mg total) by mouth every 6 (six) hours as needed for nausea.  . [DISCONTINUED] oxyCODONE (OXY IR/ROXICODONE) 5 MG immediate release tablet Take 1-2 tablets (5-10 mg total) by mouth every 3 (three) hours as needed for breakthrough pain.   No facility-administered encounter medications on file as of 08/27/2015.    Past Psychiatric History/Hospitalization(s): Patient has history of depression, anxiety and PTSD. Patient husband committed suicide in 1996. She witnessed when she saw his body hanging. Patient has taken in the past Effexor Prozac and Celexa.  Anxiety: Yes Bipolar Disorder: No Depression: Yes Mania: No Psychosis: No Schizophrenia: No Personality Disorder: No Hospitalization for psychiatric illness: No History of Electroconvulsive Shock Therapy: No Prior Suicide Attempts: No  Physical Exam: Constitutional:  BP 136/82 mmHg  Pulse 84  Ht 5\' 2"  (1.575 m)  Wt 164 lb (74.39 kg)  BMI 29.99 kg/m2  General Appearance: Patient has difficulty walking.  She is wearing cast.   Musculoskeletal: Strength & Muscle Tone: Pain in her foot Gait & Station: Difficulty in walking due to pain Patient leans: N/A  Mental status examination: Patient is casually dressed and groomed.  She is anxious but cooperative.  She maintained good eye contact.  She described her mood euthymic and her affect is appropriate.  Her speech is soft, clear and coherent.  Her thought processes logical and goal-directed.  There were no delusions, paranoia or any obsessive thoughts.  She denies any active or passive suicidal thoughts or homicidal thought.  Her fund of knowledge is good.  Her psychomotor activity is slow.  There were no flight of ideas or any loose association.  She denies any tremors or shakes.  She is alert and  oriented 3.  Her insight judgment and impulse control is okay.  Established Problem, Stable/Improving (1), Review of Psycho-Social Stressors (1), Review or order clinical lab tests (1), Review and summation of old records (2), Review of Last Therapy Session (1), Review of Medication Regimen & Side Effects (2) and Review of New Medication or Change in Dosage (2)  Assessment: Axis I:  Maj. depressive disorder, recurrent, PTSD.    Axis II:  Deferred  Axis III:  Patient Active Problem List   Diagnosis Date Noted  . Osteomyelitis ([place]) 07/08/2015  . Poorly controlled diabetes mellitus ([place]) 07/08/2015  . Depression with anxiety 07/08/2015  . Lactic acid acidosis 07/08/2015  . BP (high blood pressure) 06/17/2015  . 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 Hypothyroidism, postop 06/17/2015  . 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 Proliferative diabetic retinopathy ([place]) 06/17/2015  . Dysesthesia 06/17/2015  . CAP (community acquired pneumonia) 03/13/2015  . Dyspnea 03/13/2015  . Respiratory failure with hypoxia ([place]) 03/13/2015  . Pericardial effusion 03/13/2015  . Depression 02/15/2015  . Carpal tunnel syndrome 11/28/2014  . Diabetes, polyneuropathy ([place]) 11/15/2014  . Numbness 11/15/2014  . Gait disturbance 11/15/2014  . Hand weakness 11/15/2014  . Insomnia 11/15/2014  . DDD (degenerative  disc disease), lumbar 04/10/2014  . Diarrhea 04/05/2014  . Dyslipidemia 04/04/2014  . 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 Fatty liver 04/04/2014  . Acute pulmonary edema ([place]) 04/04/2014  . Hypokalemia 04/04/2014  . Elevated LFTs 04/04/2014  . Benign essential HTN 04/02/2014  . Hypothyroidism 04/02/2014  . Diabetes ([place]) 10/10/2013  . Pancreatitis, acute 10/10/2013  . Leukocytosis 10/04/2013  . Acute pancreatitis 10/04/2013  . Diabetic neuropathy ([place]) 10/03/2013  . TIA (transient ischemic attack) 09/23/2012  . Temporary cerebral vascular  dysfunction 09/23/2012  . Diabetic osteomyelitis ([place]) 08/29/2012  . Diabetes type 1, uncontrolled ([place]) 08/29/2012  . Type 1 diabetes mellitus ([place]) 08/29/2012  . Type 2 diabetes mellitus ([place]) 08/29/2012  . Diabetic foot ulcer ([place]) 07/27/2012  . Anxiety 08/24/2011  . Arthralgia of multiple joints 10/01/2010  . HLD (hyperlipidemia) 04/25/2010  . Avitaminosis D 04/25/2010  . Allergic rhinitis 08/16/2008    Plan:  I review her blood work results, collateral information and current medication.  She is normal or taking Lamictal .  She wants to continue Cymbalta 60 mg daily and Klonopin 0.5 mg at bedtime.  Discuss psychosocial stressors including recent surgical procedure and losing her metatarsal bone .  Patient is feeling better.  She does not feel at this time that she need counseling but promised to contact us if she felt that she needed.  Discussed medication side effects and benefits.  Continue Cymbalta 60 mg daily and Klonopin 0.5 mg at bedtime.  Recommended to call us back if she has any question or any concern.  Discuss safety plan that anytime having active suicidal thoughts or homicidal thought continue to call 911 or go to the local emergency room.  Follow-up in 3 months.  Nadege Carriger T., MD 08/27/2015

## 2015-11-25 ENCOUNTER — Ambulatory Visit (INDEPENDENT_AMBULATORY_CARE_PROVIDER_SITE_OTHER): Payer: Medicaid Other | Admitting: Psychiatry

## 2015-11-25 ENCOUNTER — Encounter (HOSPITAL_COMMUNITY): Payer: Self-pay | Admitting: Psychiatry

## 2015-11-25 VITALS — BP 134/78 | HR 96 | Ht 62.0 in | Wt 169.8 lb

## 2015-11-25 DIAGNOSIS — F330 Major depressive disorder, recurrent, mild: Secondary | ICD-10-CM

## 2015-11-25 DIAGNOSIS — F4310 Post-traumatic stress disorder, unspecified: Secondary | ICD-10-CM

## 2015-11-25 MED ORDER — DULOXETINE HCL 60 MG PO CPEP: 60.0000 mg | ORAL_CAPSULE | Freq: Every day | ORAL | Status: DC

## 2015-11-25 MED ORDER — CLONAZEPAM 0.5 MG PO TABS: 0.5000 mg | ORAL_TABLET | Freq: Every day | ORAL | Status: DC

## 2015-11-25 NOTE — Progress Notes (Signed)
[facility] Progress Note  Koby Pierpoint [id number] y.o.  11/25/2015 10:22 AM  Chief Complaint:  Medication management and follow-up.       History of Present Illness: Jasani came for her appointment.  She is taking Cymbalta and Klonopin and reported medicine is helping her anxiety and nervousness.  She is more acceptance of her chronic health issues .  She is relieved that no more episode of osteomyelitis and her pain is under control.  She sleeping better.  She denies any nightmares or any flashback.  She is pleased that her blood sugar is but stable .  She denies any paranoia, hallucination, irritability, crying spells or any feeling of hopelessness or worthlessness.  Her energy level is good.  She wants to continue Klonopin and Cymbalta.  She has no tremors shakes or any EPS.  Her appetite is okay.  Her vitals are stable.  Suicidal Ideation: No Plan Formed: No Patient has means to carry out plan: No  Homicidal Ideation: No Plan Formed: No Patient has means to carry out plan: No  Review of Systems  Constitutional: Negative.   Musculoskeletal: Negative.        Amputation Metatarsal bone  Skin: Negative for itching and rash.  Neurological: Positive for tingling and sensory change. Negative for tremors.    Psychiatric: Agitation: No Hallucination: No Depressed Mood: No Insomnia: No Hypersomnia: No Altered Concentration: No Feels Worthless: No Grandiose Ideas: No Belief In Special Powers: No New/Increased Substance Abuse: No Compulsions: No  Neurologic: Headache: No Seizure: No Paresthesias: Diabetic neuropathy in both feet  Medical History:   Patient has history of hypothyroidism, insulin-dependent diabetes, diabetic neuropathy, hypertension macular degeneration.    Outpatient Encounter Prescriptions as of 11/25/2015  Medication Sig  . clonazePAM (KLONOPIN) 0.5 MG tablet Take 1 tablet (0.5 mg total) by mouth at bedtime.  . DULoxetine (CYMBALTA) 60  MG capsule Take 1 capsule (60 mg total) by mouth daily.  . [DISCONTINUED] clonazePAM (KLONOPIN) 0.5 MG tablet Take 1 tablet (0.5 mg total) by mouth at bedtime.  . [DISCONTINUED] DULoxetine (CYMBALTA) 60 MG capsule Take 1 capsule (60 mg total) by mouth daily.  . CRESTOR 40 MG tablet Take 1 tablet by mouth daily  . folic acid (FOLVITE) 1 MG tablet Take 1 mg by mouth daily.  Marland Kitchen ibuprofen (ADVIL,MOTRIN) 600 MG tablet Take 1 tablet (600 mg total) by mouth 3 (three) times daily. (Patient not taking: Reported on 07/08/2015)  . insulin aspart (NOVOLOG) 100 UNIT/ML injection Inject 20 Units into the skin 3 (three) times daily before meals.   . insulin glargine (LANTUS) 100 UNIT/ML injection Inject 40 Units into the skin 2 (two) times daily.  Marland Kitchen levothyroxine (SYNTHROID, LEVOTHROID) 137 MCG tablet Take 137 mcg by mouth daily.    Marland Kitchen lisinopril-hydrochlorothiazide (PRINZIDE,ZESTORETIC) 20-25 MG tablet Take 1 tablet by mouth every morning.   No facility-administered encounter medications on file as of 11/25/2015.    Past Psychiatric History/Hospitalization(s): Patient has history of depression, anxiety and PTSD. Patient husband committed suicide in 1996. She witnessed when she saw his body hanging. Patient has taken in the past Effexor Prozac and Celexa.  Anxiety: Yes Bipolar Disorder: No Depression: Yes Mania: No Psychosis: No Schizophrenia: No Personality Disorder: No Hospitalization for psychiatric illness: No History of Electroconvulsive Shock Therapy: No Prior Suicide Attempts: No  Physical Exam: Constitutional:  BP 134/78 mmHg  Pulse 96  Ht 5\' 2"  (1.575 m)  Wt 169 lb 12.8 oz (77.021 kg)  BMI 31.05 kg/m2  General Appearance: Patient has difficulty walking.  She is wearing cast.   Musculoskeletal: Strength & Muscle Tone: Pain in her foot Gait & Station: Difficulty in walking due to pain Patient leans: N/A  Mental status examination: Patient is casually dressed and groomed.  She is  pleasant and cooperative.  She maintained good eye contact.  She described her mood euthymic and her affect is appropriate.  Her speech is soft, clear and coherent.  Her thought processes logical and goal-directed.  There were no delusions, paranoia or any obsessive thoughts.  She denies any active or passive suicidal thoughts or homicidal thought.  Her fund of knowledge is good.  Her psychomotor activity is slow.  There were no flight of ideas or any loose association.  She denies any tremors or shakes.  She is alert and oriented 3.  Her insight judgment and impulse control is okay.  Established Problem, Stable/Improving (1), Review of Last Therapy Session (1) and Review of Medication Regimen & Side Effects (2)  Assessment: Axis I:  Maj. depressive disorder, recurrent, PTSD.    Axis II:  Deferred  Axis III:  Patient Active Problem List   Diagnosis Date Noted  . Osteomyelitis ([place]) 07/08/2015  . Poorly controlled diabetes mellitus ([place]) 07/08/2015  . Depression with anxiety 07/08/2015  . Lactic acid acidosis 07/08/2015  . BP (high blood pressure) 06/17/2015  . 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 Hypothyroidism, postop 06/17/2015  . 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 Proliferative diabetic retinopathy ([place]) 06/17/2015  . Dysesthesia 06/17/2015  . CAP (community acquired pneumonia) 03/13/2015  . Dyspnea 03/13/2015  . Respiratory failure with hypoxia ([place]) 03/13/2015  . Pericardial effusion 03/13/2015  . Depression 02/15/2015  . Carpal tunnel syndrome 11/28/2014  . Diabetes, polyneuropathy ([place]) 11/15/2014  . Numbness 11/15/2014  . Gait disturbance 11/15/2014  . Hand weakness 11/15/2014  . Insomnia 11/15/2014  . DDD (degenerative disc disease), lumbar 04/10/2014  . Diarrhea 04/05/2014  . Dyslipidemia 04/04/2014  . 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 Fatty liver 04/04/2014  . Acute pulmonary edema ([place]) 04/04/2014  . Hypokalemia  04/04/2014  . Elevated LFTs 04/04/2014  . Benign essential HTN 04/02/2014  . Hypothyroidism 04/02/2014  . Diabetes ([place]) 10/10/2013  . Pancreatitis, acute 10/10/2013  . Leukocytosis 10/04/2013  . Acute pancreatitis 10/04/2013  . Diabetic neuropathy ([place]) 10/03/2013  . TIA (transient ischemic attack) 09/23/2012  . Temporary cerebral vascular dysfunction 09/23/2012  . Diabetic osteomyelitis ([place]) 08/29/2012  . Diabetes type 1, uncontrolled ([place]) 08/29/2012  . Type 1 diabetes mellitus ([place]) 08/29/2012  . Type 2 diabetes mellitus ([place]) 08/29/2012  . Diabetic foot ulcer ([place]) 07/27/2012  . Anxiety 08/24/2011  . Arthralgia of multiple joints 10/01/2010  . HLD (hyperlipidemia) 04/25/2010  . Avitaminosis D 04/25/2010  . Allergic rhinitis 08/16/2008    Plan:  Patient is a stable on her current psychiatric medication.  She like to continue Cymbalta 60 mg daily and Klonopin 0.5 mg at bedtime.  Discussed medication side effects and benefits.  Discussed benzodiazepine dependence and tolerance.  She does not ask for early refills for her benzodiazepine.  Recommended to call us back if she has any question or any concern.  Discuss safety plan that anytime having active suicidal thoughts or homicidal thought continue to call 911 or go to the local emergency room.  Follow-up in 3 months.  Samiya Mervin T., MD 11/25/2015

## 2016-01-07 ENCOUNTER — Telehealth: Payer: Self-pay | Admitting: Cardiovascular Disease

## 2016-01-07 NOTE — Telephone Encounter (Signed)
Pt has been having SOB with exertion x 1 month.  Pt states she can hardly get up and do anything without getting SOB.  Also c/o palpitations for about 2 weeks that come and go.  Pt states she can bend over and is able to get them to subside sometimes.  Pt cut back on caffeine and this has not helped.  Swelling in bilateral feet x 3 days and then it resolved and has not returned.  Denies CP, lightheadedness, dizziness or visual disturbances.  Pt does check her vitals at home and states BP is usually around 138/70 and HR in 90's most of the time.  Scheduled pt to see Rosaria Ferries, PA-C at our [facility] office at 6/29 at 0830.  Pt verbalized understanding and was appreciative for call.  Will route to Dr. Burt Knack to make him aware.

## 2016-01-07 NOTE — Telephone Encounter (Signed)
Pt calling to see Cooper-has SOB-heart fluttering-last seen 10-2015-will he take her at re-est since he isn't taking on new patients? And how soon can se be seen? Can't see PA since technically new but can see flex for 1 hour

## 2016-01-09 ENCOUNTER — Ambulatory Visit (INDEPENDENT_AMBULATORY_CARE_PROVIDER_SITE_OTHER): Payer: Medicaid Other | Admitting: Physician Assistant

## 2016-01-09 ENCOUNTER — Encounter: Payer: Self-pay | Admitting: Physician Assistant

## 2016-01-09 VITALS — BP 142/84 | HR 79 | Ht 62.0 in | Wt 172.2 lb

## 2016-01-09 DIAGNOSIS — R600 Localized edema: Secondary | ICD-10-CM | POA: Diagnosis not present

## 2016-01-09 DIAGNOSIS — R002 Palpitations: Secondary | ICD-10-CM | POA: Diagnosis not present

## 2016-01-09 DIAGNOSIS — M79602 Pain in left arm: Secondary | ICD-10-CM | POA: Diagnosis not present

## 2016-01-09 NOTE — Patient Instructions (Signed)
Medication Instructions: Rosaria Ferries, PA-C recommends that you continue on your current medications as directed. Please refer to the Current Medication list given to you today.  Labwork: NONE ORDERED  Testing/Procedures: 1. Echocardiogram - Your physician has requested that you have an echocardiogram. Echocardiography is a painless test that uses sound waves to create images of your heart. It provides your doctor with information about the size and shape of your heart and how well your heart's chambers and valves are working. This procedure takes approximately one hour. There are no restrictions for this procedure.  2. 48-hour Holter Monitor - Your physician has recommended that you wear a holter monitor. Holter monitors are medical devices that record the heart's electrical activity. Doctors most often use these monitors to diagnose arrhythmias. Arrhythmias are problems with the speed or rhythm of the heartbeat. The monitor is a small, portable device. You can wear one while you do your normal daily activities. This is usually used to diagnose what is causing palpitations/syncope (passing out).  **These studies will be done at our [facility] location - [address], [address]. Their phone number is [phone].  Follow-up: Suanne Marker recommends that you schedule a follow-up appointment after your testing with Dr Burt Knack.  If you need a refill on your cardiac medications before your next appointment, please call your pharmacy.

## 2016-01-09 NOTE — Progress Notes (Signed)
Cardiology Office Note   Date:  01/09/2016   ID:  Suzanne Allen, DOB 09-28-1960, MRN [id number]  PCP:  Barrie Lyme, FNP  Cardiologist:  Dr Nolene Bernheim, PA-C   Chief Complaint  Patient presents with  . Shortness of Breath  . Palpitations    x 1 month  . Arm Pain    left  . Neck Pain    History of Present Illness: Suzanne Allen is a 55 y.o. female with a history of HTN, HLD, DM, hypothyroid, depression, CVA, PTSD, 02/2015 peric and pleural effusions (rx w/ Lasix, colchicine and resolved).  Suzanne Allen presents for Evaluation of several issues.  1. She gets a strong fluttering feeling in her heart. She will get this on a daily basis. The fluttering is associated with shortness of breath and she has also had some presyncope with it. It lasts for one or 2 minutes. It resolves spontaneously. Sometimes it makes her cough. This seems to help when that happens. She has not sought help for this, or taken any medications for it. She does not know what brings it on.  2. Left arm pain: She has had it for a month now. Sometimes it goes into the back of her neck. It reaches 6/10 at its worst. Sometimes it wakes her up. Position changes seen to help. Because of the pain, she can't raise her left arm. The pain seems to originate in the axillary area and radiates up into her upper left chest and sometimes into her left arm.  3. Edema: On an intermittent but regular basis she wakes up with swollen feet and puffy eyes. This happens about once a week. It improves during the day, but does not always completely resolve. She does not know if she has had extra salt but not before. She is compliant with her medications including lisinopril HCTZ.  She is worried because her mother died of heart failure at a relatively young age. She is frightened by her symptoms and wishes to know if we can evaluate them and help her.   Past Medical History  Diagnosis Date  . HTN (hypertension)   . Macular  degeneration, bilateral   . Hypercholesteremia   . Hypothyroidism   . Stroke [facility]) ? date & 09/22/2012  . Peripheral neuropathy ([place])   . Anxiety   . Depression   . PTSD (post-traumatic stress disorder)   . 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 Fatty liver 04/04/2014  . Carpal tunnel syndrome 11/28/2014    Bilateral  . CAP (community acquired pneumonia) 03/13/2015  . Pneumonia 1993  . Diabetes mellitus type I ([place]) dx'd 1977  . Diabetic osteomyelitis ([place])     right great toe/progress notes 09/23/2012  . Migraines 1980's    "when I was in my 20's" (03/14/2015)  . Chronic pancreatitis ([place])   . Diabetic retinopathy ([place])   . TIA (transient ischemic attack) 09/22/2012    [name] 10/25/2012    Past Surgical History  Procedure Laterality Date  . Cesarean section  1989; 1995  . Tonsillectomy  1965  . Refractive surgery Bilateral 2013  . Total thyroidectomy  2012  . Peripherally inserted central catheter insertion Right 08/31/2012  . I&d extremity Right multiple    "big toe"  . Toe amputation Right 11/2012    "big toe"  . Tubal ligation  1995  . Eye surgery Bilateral     "hemorrhaged behind my eye"  . Picc line removal ([facility] hx)  Right 10/12/2012    [name] 10/12/2012  . Amputation Right 07/12/2015    Procedure: Right Transmetatarsal amputation;  Surgeon: Newt Minion, MD;  Location: [facility];  Service: Orthopedics;  Laterality: Right;    Current Outpatient Prescriptions  Medication Sig Dispense Refill  . amLODipine (NORVASC) 5 MG tablet Take 1 tablet by mouth every other day.    . clonazePAM (KLONOPIN) 0.5 MG tablet Take 1 tablet (0.5 mg total) by mouth at bedtime. 30 tablet 2  . CRESTOR 40 MG tablet Take 1 tablet by mouth daily  5  . DULoxetine (CYMBALTA) 60 MG capsule Take 1 capsule (60 mg total) by mouth daily. 30 capsule 2  . folic acid (FOLVITE) 1 MG tablet Take 1 mg by mouth daily.    . insulin aspart (NOVOLOG) 100 UNIT/ML injection Inject 20 Units into the skin  3 (three) times daily before meals.     . insulin glargine (LANTUS) 100 UNIT/ML injection Inject 40 Units into the skin 2 (two) times daily.    Marland Kitchen levothyroxine (SYNTHROID, LEVOTHROID) 137 MCG tablet Take 137 mcg by mouth daily.      Marland Kitchen lisinopril-hydrochlorothiazide (PRINZIDE,ZESTORETIC) 20-25 MG tablet Take 1 tablet by mouth every morning.     No current facility-administered medications for this visit.    Allergies:   Erythromycin and Vancomycin    Social History:  The patient  reports that she has never smoked. She has never used smokeless tobacco. She reports that she does not drink alcohol or use illicit drugs.   Family History:  The patient's family history includes Alcohol abuse in her father and sister; Depression in her mother.    ROS:  Please see the history of present illness. All other systems are reviewed and negative.    PHYSICAL EXAM: VS:  BP 142/84 mmHg  Pulse 79  Ht 5\' 2"  (1.575 m)  Wt 172 lb 3.2 oz (78.109 kg)  BMI 31.49 kg/m2 , BMI Body mass index is 31.49 kg/(m^2). GEN: Well nourished, well developed, female in no acute distress HEENT: normal for age  Neck: no JVD, no carotid bruit, no masses Cardiac: RRR; no murmur, no rubs, or gallops; heart sounds are not diminished, PMI is normal. Respiratory:  clear to auscultation bilaterally, normal work of breathing GI: soft, nontender, nondistended, + BS MS: no deformity or atrophy; no edema; distal pulses are 2+ in all 4 extremities; she has decreased range of motion secondary to pain in her left arm. There is no point tenderness crepitus or other signs of acute injury. Skin: warm and dry, no rash Neuro:  Strength and sensation are intact Psych: euthymic mood, full affect   EKG:  EKG is ordered today. The ekg ordered today demonstrates sinus rhythm, heart rate 79, no acute ischemic changes, normal intervals   Recent Labs: 03/13/2015: ALT 15; B Natriuretic Peptide 132.0*; TSH 5.231* 07/13/2015: BUN 16; Creatinine,  Ser 0.85; Hemoglobin 10.7*; Platelets 356; Potassium 5.2*; Sodium 140    Lipid Panel    Component Value Date/Time   CHOL 168 04/02/2014 0537   TRIG 119 04/02/2014 0537   HDL 43 04/02/2014 0537   CHOLHDL 3.9 04/02/2014 0537   VLDL 24 04/02/2014 0537   LDLCALC 101* 04/02/2014 0537     Wt Readings from Last 3 Encounters:  01/09/16 172 lb 3.2 oz (78.109 kg)  11/25/15 169 lb 12.8 oz (77.021 kg)  08/27/15 164 lb (74.39 kg)     Other studies Reviewed: Additional studies/ records that were reviewed today include: Previous  office notes and testing.  ASSESSMENT AND PLAN:  1.  Chest pain: Her symptoms are not exertional. I was able to reassure her that I did not feel her symptoms were consistent with a cardiac etiology. She is encouraged to follow-up with her family physician for this.  2. Palpitations: She is getting a fluttering feeling on a daily basis associated with shortness of breath and sometimes presyncope. We will get a 48-hour Holter to determine what this is and then determine treatment.  3. History of pericardial effusion with intermittent lower extremity edema: She is encouraged to limit sodium, especially at night and try to figure out if she is getting extra sodium the night before she wakes up with edema. She does not have any edema on physical exam today. However, because of her history of pericardial effusion, we will check an echocardiogram.   Current medicines are reviewed at length with the patient today.  The patient does not have concerns regarding medicines.  The following changes have been made:  no change  Labs/ tests ordered today include:   Orders Placed This Encounter  Procedures  . Holter monitor - 48 hour  . EKG 12-Lead  . ECHOCARDIOGRAM COMPLETE     Disposition:   FU with Dr. Burt Knack  Signed, Kyriaki Moder, Suanne Marker, PA-C  01/09/2016 1:12 PM    [facility] Phone: [phone]; Fax: [phone]  This note was written with  the assistance of speech recognition software. Please excuse any transcriptional errors.

## 2016-01-28 ENCOUNTER — Other Ambulatory Visit (HOSPITAL_COMMUNITY): Payer: Self-pay

## 2016-02-25 ENCOUNTER — Ambulatory Visit (HOSPITAL_COMMUNITY): Payer: Self-pay | Admitting: Psychiatry

## 2016-04-10 ENCOUNTER — Other Ambulatory Visit (HOSPITAL_COMMUNITY): Payer: Self-pay

## 2016-04-10 DIAGNOSIS — F330 Major depressive disorder, recurrent, mild: Secondary | ICD-10-CM

## 2016-04-10 MED ORDER — DULOXETINE HCL 60 MG PO CPEP: 60.0000 mg | ORAL_CAPSULE | Freq: Every day | ORAL | 0 refills | Status: DC

## 2016-04-10 MED ORDER — CLONAZEPAM 0.5 MG PO TABS: 0.5000 mg | ORAL_TABLET | Freq: Every day | ORAL | 0 refills | Status: DC

## 2016-04-13 ENCOUNTER — Ambulatory Visit (HOSPITAL_COMMUNITY): Payer: Self-pay | Admitting: Psychiatry

## 2016-04-15 ENCOUNTER — Telehealth: Payer: Self-pay | Admitting: Physician Assistant

## 2016-04-15 NOTE — Telephone Encounter (Signed)
Attempted to schedule pt several times no call backs and no answer. Please see messages below.   04/15/2016 LMOM for pt to call and schedule monitor appt. stpegram 04/02/2016 LMOM for pt to call and schedule monitor appt. stpegram 02/27/2016 LMOM for pt to call and schedule appt for Echo and monitor. stpegram 02/18/2016 LMOM for pt to call and schedule monitor and echo .. stepegram 01/24/16 - pt has conflcit cancelled 48hr monitor scheduled for 01/28/2016- will call back to resch; ah  Davy Pique

## 2016-04-30 NOTE — Progress Notes (Signed)
Suzanne Allen  Suzanne Allen [id number] 55 y.o.  05/04/2016 5:21 PM  Chief Complaint:  Medication management and follow-up.      Chief Complaint  Patient presents with  . Follow-up  . Depression    History of Present Illness: Suzanne Allen came for her appointment.  Patient states that she has been doing well since the last encounter. She has had "violent dreams," although she denies any relevance to her trauma. She sleeps 6 hours with good rest. She has been taking care of all her "mentally handicapped" daughter of 75 year old. She enjoys meditation and meeting with her friends. She denies irritability. She has good energy. Although she "don't know the purpose (in life)," she denies SI, stating that she needs to take care of her daughter. She talks about her husband who completed suicide in 1996; she denies any dwelling on him lately. She denies alcohol or drug use. She sees a therapist for biofeedback, once a month.   Suicidal Ideation: No Plan Formed: No Patient has means to carry out plan: No  Homicidal Ideation: No Plan Formed: No Patient has means to carry out plan: No  Review of Systems  Constitutional: Negative.   Musculoskeletal: Negative.        Amputation Metatarsal bone  Skin: Negative for itching and rash.  Neurological: Positive for tingling and sensory change. Negative for tremors.  Psychiatric/Behavioral: Negative for depression, hallucinations, substance abuse and suicidal ideas. The patient is not nervous/anxious and does not have insomnia.   All other systems reviewed and are negative.   Psychiatric: Agitation: No Hallucination: No Depressed Mood: No Insomnia: No Hypersomnia: No Altered Concentration: No Feels Worthless: No Grandiose Ideas: No Belief In Special Powers: No New/Increased Substance Abuse: No Compulsions: No  Neurologic: Headache: No Seizure: No Paresthesias: Diabetic neuropathy in both feet  Medical History:    Patient has history of hypothyroidism, insulin-dependent diabetes, diabetic neuropathy, hypertension macular degeneration.   Past Medical History:  Diagnosis Date  . 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 Anxiety   . CAP (community acquired pneumonia) 03/13/2015  . Carpal tunnel syndrome 11/28/2014   Bilateral  . Chronic pancreatitis ([place])   . Depression   . Diabetes mellitus type I ([place]) dx'd 1977  . Diabetic osteomyelitis ([place])    right great toe/progress notes 09/23/2012  . Diabetic retinopathy ([place])   . Fatty liver 04/04/2014  . HTN (hypertension)   . Hypercholesteremia   . Hypothyroidism   . Macular degeneration, bilateral   . Migraines 1980's   "when I was in my 20's" (03/14/2015)  . Peripheral neuropathy ([place])   . Pneumonia 1993  . PTSD (post-traumatic stress disorder)   . Stroke [facility]) ? date & 09/22/2012  . TIA (transient ischemic attack) 09/22/2012   [name] 10/25/2012     Outpatient Encounter Prescriptions as of 05/04/2016  Medication Sig Dispense Refill  . amLODipine (NORVASC) 5 MG tablet Take 1 tablet by mouth every other day.    . clonazePAM (KLONOPIN) 0.5 MG tablet Take 1 tablet (0.5 mg total) by mouth at bedtime. 30 tablet 2  . CRESTOR 40 MG tablet Take 1 tablet by mouth daily  5  . DULoxetine (CYMBALTA) 60 MG capsule Take 1 capsule (60 mg total) by mouth daily. 30 capsule 2  . folic acid (FOLVITE) 1 MG tablet Take 1 mg by mouth daily.    . insulin aspart (NOVOLOG) 100 UNIT/ML injection Inject 20 Units into the skin 3 (three) times daily  before meals.     . insulin glargine (LANTUS) 100 UNIT/ML injection Inject 40 Units into the skin 2 (two) times daily.    Marland Kitchen levothyroxine (SYNTHROID, LEVOTHROID) 137 MCG tablet Take 137 mcg by mouth daily.      Marland Kitchen lisinopril-hydrochlorothiazide (PRINZIDE,ZESTORETIC) 20-25 MG tablet Take 1 tablet by mouth every morning.    . [DISCONTINUED] clonazePAM (KLONOPIN) 0.5 MG tablet Take 1 tablet (0.5 mg total) by mouth at  bedtime. 30 tablet 0  . [DISCONTINUED] DULoxetine (CYMBALTA) 60 MG capsule Take 1 capsule (60 mg total) by mouth daily. 30 capsule 0   No facility-administered encounter medications on file as of 05/04/2016.     Past Psychiatric History/Hospitalization(s): Patient has history of depression, anxiety and PTSD. Patient husband committed suicide in 1996. She witnessed when she saw his body hanging. Patient has taken in the past Effexor Prozac and Celexa.  Anxiety: Yes Bipolar Disorder: No Depression: Yes Mania: No Psychosis: No Schizophrenia: No Personality Disorder: No Hospitalization for psychiatric illness: No History of Electroconvulsive Shock Therapy: No Prior Suicide Attempts: No  Physical Exam: Constitutional:  There were no vitals taken for this visit.  General Appearance: Patient has difficulty walking.  She is wearing cast.   Musculoskeletal: Strength & Muscle Tone: Pain in her foot Gait & Station: Difficulty in walking due to pain Patient leans: N/A  Mental status examination: Patient is casually dressed and groomed.  She is pleasant and cooperative.  She maintained good eye contact.  She described her mood euthymic and her affect is slightly restricted.  Her speech is soft, clear and coherent.  Her thought processes logical and goal-directed.  There were no delusions, paranoia or any obsessive thoughts.  She denies any active or passive suicidal thoughts or homicidal thought.  Her fund of knowledge is good.  Her psychomotor activity is normal.  There were no flight of ideas or any loose association.  She denies any tremors or shakes.  She is alert and oriented 3.  Her insight judgment and impulse control is okay.  Established Problem, Stable/Improving (1), Review of Last Therapy Session (1) and Review of Medication Regimen & Side Effects (2)  Assessment: Suzanne Allen is a 55 year old female with depression, PTSD, HTN, HLD, DM, hypothyroid, depression, CVA, who presents for  follow up. She is a patient of Dr. Adele Schilder.   # MDD # PTSD Patient denies any significant mood symptoms since the last encounter. Will continue current medication. She continues to see her therapist in the community for biofeedback. Discussed benzodiazepine dependence and tolerance.  She does not ask for early refills for her benzodiazepine.  Recommended to call us back if she has any question or any concern.  Discuss safety plan that anytime having active suicidal thoughts or homicidal thought continue to call 911 or go to the local emergency room.  Plan:  - Continue duloxetine 60 mg daily - Continue clonazepam 0.5 mg qhs - RTC in 3 months  The patient demonstrates the following risk factors for suicide: Chronic risk factors for suicide include: psychiatric disorder of depression, PTSD. Acute risk factors for suicide include: unemployment. Protective factors for this patient include: positive therapeutic relationship, responsibility to others (children, family) and hope for the future. Considering these factors, the overall suicide risk at this point appears to be low. Patient is appropriate for outpatient follow up.  Norman Clay, MD 05/04/2016

## 2016-05-04 ENCOUNTER — Ambulatory Visit (INDEPENDENT_AMBULATORY_CARE_PROVIDER_SITE_OTHER): Payer: Medicaid Other | Admitting: Psychiatry

## 2016-05-04 DIAGNOSIS — F330 Major depressive disorder, recurrent, mild: Secondary | ICD-10-CM | POA: Diagnosis not present

## 2016-05-04 DIAGNOSIS — Z79899 Other long term (current) drug therapy: Secondary | ICD-10-CM | POA: Diagnosis not present

## 2016-05-04 MED ORDER — CLONAZEPAM 0.5 MG PO TABS: 0.5000 mg | ORAL_TABLET | Freq: Every day | ORAL | 2 refills | Status: DC

## 2016-05-04 MED ORDER — DULOXETINE HCL 60 MG PO CPEP: 60.0000 mg | ORAL_CAPSULE | Freq: Every day | ORAL | 2 refills | Status: DC

## 2016-05-04 NOTE — Patient Instructions (Signed)
1. Continue current medication 2. Return to clinic in 3 months

## 2016-06-16 ENCOUNTER — Ambulatory Visit: Payer: Medicaid Other | Admitting: Neurology

## 2016-06-18 ENCOUNTER — Encounter: Payer: Self-pay | Admitting: Neurology

## 2016-06-20 ENCOUNTER — Other Ambulatory Visit (HOSPITAL_COMMUNITY): Payer: Self-pay | Admitting: Psychiatry

## 2016-06-20 DIAGNOSIS — F330 Major depressive disorder, recurrent, mild: Secondary | ICD-10-CM

## 2016-07-17 ENCOUNTER — Ambulatory Visit (INDEPENDENT_AMBULATORY_CARE_PROVIDER_SITE_OTHER): Payer: Medicaid Other | Admitting: Orthopedic Surgery

## 2016-07-17 VITALS — Ht 62.0 in | Wt 172.0 lb

## 2016-07-17 DIAGNOSIS — Z89431 Acquired absence of right foot: Secondary | ICD-10-CM | POA: Diagnosis not present

## 2016-07-17 NOTE — Progress Notes (Signed)
Office Visit Note   Patient: Suzanne Allen           Date of Birth: May 03, 1961           MRN: [id number] Visit Date: 07/17/2016              Requested by: Barrie Lyme, [address] [place], [place] [address] PCP: Barrie Lyme, FNP  Chief Complaint  Patient presents with  . Left Foot - Wound Check    "cracked skin" at the base of the great toe  . Right Foot - Wound Check    History of right transmet amputation     HPI: Patient has a history of a right transmet amputation.  She states that the left great toe is showing signs of cracking and peeling skin and that this is the way it began in the right foot and that she is afraid that this will continue and result in the left foot requiring amputation and wants to be proactive in her care. She is diabetic. Pamella Pert, RMA    Wound Check     Assessment & Plan: Visit Diagnoses:  1. Status post transmetatarsal amputation of foot, right ([place])     Plan: Recommended a new balance walking sneaker to unload the forefoot. Recommended sole orthotics. Recommended antibiotic ointment and Band-Aid for the skin crack on the left great toe and the right foot  Follow-Up Instructions: Return in about 2 months (around 09/14/2016).   Ortho Exam Examination patient is alert oriented no adenopathy well-dressed on the left I compressed ref which she does have an antalgic gait she is a good dorsalis pedis pulse bilaterally. She has good dorsiflexion of both feet she has a cracked skin fissure beneath the great toe IP joint of the left. She has some cracked dry skin across the forefoot with callus across the forefoot on the right. Patient has soft shoe wear which is overloading her forefoot bilaterally.  Imaging: No results found.  Orders:  No orders of the defined types were placed in this encounter.  No orders of the defined types were placed in this encounter.    Procedures: No procedures performed  Clinical  Data: No additional findings.  Subjective: Review of Systems  Objective: Vital Signs: Ht 5\' 2"  (1.575 m)   Wt 172 lb (78 kg)   BMI 31.46 kg/m   Specialty Comments:  No specialty comments available.  PMFS History: Patient Active Problem List   Diagnosis Date Noted  . Status post transmetatarsal amputation of foot, right ([place]) 07/17/2016  . Major depressive disorder, recurrent episode, mild ([place]) 05/04/2016  . Osteomyelitis ([place]) 07/08/2015  . Poorly controlled diabetes mellitus ([place]) 07/08/2015  . Depression with anxiety 07/08/2015  . Lactic acid acidosis 07/08/2015  . BP (high blood pressure) 06/17/2015  . 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 Hypothyroidism, postop 06/17/2015  . 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 Proliferative diabetic retinopathy ([place]) 06/17/2015  . Dysesthesia 06/17/2015  . CAP (community acquired pneumonia) 03/13/2015  . Dyspnea 03/13/2015  . Respiratory failure with hypoxia ([place]) 03/13/2015  . Pericardial effusion 03/13/2015  . Depression 02/15/2015  . Carpal tunnel syndrome 11/28/2014  . Diabetes, polyneuropathy ([place]) 11/15/2014  . Numbness 11/15/2014  . Gait disturbance 11/15/2014  . Hand weakness 11/15/2014  . Insomnia 11/15/2014  . DDD (degenerative disc disease), lumbar 04/10/2014  . Diarrhea 04/05/2014  . Dyslipidemia 04/04/2014  . 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 Fatty liver  04/04/2014  . Acute pulmonary edema ([place]) 04/04/2014  . Hypokalemia 04/04/2014  . Elevated LFTs 04/04/2014  . Benign essential HTN 04/02/2014  . Hypothyroidism 04/02/2014  . Diabetes ([place]) 10/10/2013  . Pancreatitis, acute 10/10/2013  . Leukocytosis 10/04/2013  . Acute pancreatitis 10/04/2013  . Diabetic neuropathy ([place]) 10/03/2013  . TIA (transient ischemic attack) 09/23/2012  . Temporary cerebral vascular dysfunction 09/23/2012  . Diabetic osteomyelitis ([place]) 08/29/2012  . Diabetes type 1, uncontrolled  ([place]) 08/29/2012  . Type 1 diabetes mellitus ([place]) 08/29/2012  . Type 2 diabetes mellitus ([place]) 08/29/2012  . Diabetic foot ulcer ([place]) 07/27/2012  . Anxiety 08/24/2011  . Arthralgia of multiple joints 10/01/2010  . HLD (hyperlipidemia) 04/25/2010  . Avitaminosis D 04/25/2010  . Allergic rhinitis 08/16/2008   Past Medical History:  Diagnosis Date  . 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 Anxiety   . CAP (community acquired pneumonia) 03/13/2015  . Carpal tunnel syndrome 11/28/2014   Bilateral  . Chronic pancreatitis ([place])   . Depression   . Diabetes mellitus type I ([place]) dx'd 1977  . Diabetic osteomyelitis ([place])    right great toe/progress notes 09/23/2012  . Diabetic retinopathy ([place])   . Fatty liver 04/04/2014  . HTN (hypertension)   . Hypercholesteremia   . Hypothyroidism   . Macular degeneration, bilateral   . Migraines 1980's   "when I was in my 20's" (03/14/2015)  . Peripheral neuropathy ([place])   . Pneumonia 1993  . PTSD (post-traumatic stress disorder)   . Stroke [facility]) ? date & 09/22/2012  . TIA (transient ischemic attack) 09/22/2012   [name] 10/25/2012    Family History  Problem Relation Age of Onset  . Depression Mother   . Alcohol abuse Father   . Alcohol abuse Sister     Past Surgical History:  Procedure Laterality Date  . AMPUTATION Right 07/12/2015   Procedure: Right Transmetatarsal amputation;  Surgeon: Newt Minion, MD;  Location: [facility];  Service: Orthopedics;  Laterality: Right;  . [place]; 1995  . EYE SURGERY Bilateral    "hemorrhaged behind my eye"  . I&D EXTREMITY Right multiple   "big toe"  . PERIPHERALLY INSERTED CENTRAL CATHETER INSERTION Right 08/31/2012  . PICC LINE REMOVAL ([place] [facility]) Right 10/12/2012   [name] 10/12/2012  . REFRACTIVE SURGERY Bilateral 2013  . TOE AMPUTATION Right 11/2012   "big toe"  . TONSILLECTOMY  1965  . TOTAL THYROIDECTOMY  2012  . TUBAL LIGATION  1995   Social History   Occupational  History  . Not on file.   Social History Main Topics  . Smoking status: Never Smoker  . Smokeless tobacco: Never Used  . Alcohol use No  . Drug use: No  . Sexual activity: Not Currently

## 2016-07-23 ENCOUNTER — Observation Stay (HOSPITAL_COMMUNITY)
Admission: EM | Admit: 2016-07-23 | Discharge: 2016-07-25 | Disposition: A | Discharge status: Home / Self Care | Payer: Medicaid Other | Attending: Internal Medicine | Admitting: Internal Medicine

## 2016-07-23 ENCOUNTER — Observation Stay (HOSPITAL_COMMUNITY): Payer: Medicaid Other

## 2016-07-23 ENCOUNTER — Encounter (HOSPITAL_COMMUNITY): Payer: Self-pay

## 2016-07-23 ENCOUNTER — Emergency Department (HOSPITAL_COMMUNITY): Payer: Medicaid Other

## 2016-07-23 DIAGNOSIS — E10319 Type 1 diabetes mellitus with unspecified diabetic retinopathy without macular edema: Secondary | ICD-10-CM | POA: Diagnosis not present

## 2016-07-23 DIAGNOSIS — I129 Hypertensive chronic kidney disease with stage 1 through stage 4 chronic kidney disease, or unspecified chronic kidney disease: Secondary | ICD-10-CM | POA: Diagnosis not present

## 2016-07-23 DIAGNOSIS — Z89431 Acquired absence of right foot: Secondary | ICD-10-CM | POA: Insufficient documentation

## 2016-07-23 DIAGNOSIS — Z8673 Personal history of transient ischemic attack (TIA), and cerebral infarction without residual deficits: Secondary | ICD-10-CM | POA: Insufficient documentation

## 2016-07-23 DIAGNOSIS — E1065 Type 1 diabetes mellitus with hyperglycemia: Secondary | ICD-10-CM | POA: Insufficient documentation

## 2016-07-23 DIAGNOSIS — Z881 Allergy status to other antibiotic agents status: Secondary | ICD-10-CM | POA: Diagnosis not present

## 2016-07-23 DIAGNOSIS — Z794 Long term (current) use of insulin: Secondary | ICD-10-CM | POA: Insufficient documentation

## 2016-07-23 DIAGNOSIS — E7800 Pure hypercholesterolemia, unspecified: Secondary | ICD-10-CM | POA: Diagnosis not present

## 2016-07-23 DIAGNOSIS — N189 Chronic kidney disease, unspecified: Secondary | ICD-10-CM | POA: Insufficient documentation

## 2016-07-23 DIAGNOSIS — E1042 Type 1 diabetes mellitus with diabetic polyneuropathy: Secondary | ICD-10-CM | POA: Insufficient documentation

## 2016-07-23 DIAGNOSIS — L03116 Cellulitis of left lower limb: Secondary | ICD-10-CM | POA: Diagnosis not present

## 2016-07-23 DIAGNOSIS — F329 Major depressive disorder, single episode, unspecified: Secondary | ICD-10-CM | POA: Diagnosis not present

## 2016-07-23 DIAGNOSIS — E1022 Type 1 diabetes mellitus with diabetic chronic kidney disease: Secondary | ICD-10-CM | POA: Insufficient documentation

## 2016-07-23 DIAGNOSIS — E11628 Type 2 diabetes mellitus with other skin complications: Secondary | ICD-10-CM

## 2016-07-23 DIAGNOSIS — IMO0002 Reserved for concepts with insufficient information to code with codable children: Secondary | ICD-10-CM | POA: Diagnosis present

## 2016-07-23 DIAGNOSIS — E890 Postprocedural hypothyroidism: Secondary | ICD-10-CM | POA: Insufficient documentation

## 2016-07-23 DIAGNOSIS — Z79899 Other long term (current) drug therapy: Secondary | ICD-10-CM | POA: Diagnosis not present

## 2016-07-23 DIAGNOSIS — N179 Acute kidney failure, unspecified: Secondary | ICD-10-CM

## 2016-07-23 DIAGNOSIS — L089 Local infection of the skin and subcutaneous tissue, unspecified: Secondary | ICD-10-CM

## 2016-07-23 DIAGNOSIS — G4700 Insomnia, unspecified: Secondary | ICD-10-CM | POA: Insufficient documentation

## 2016-07-23 LAB — COMPREHENSIVE METABOLIC PANEL
ALT: 27 U/L (ref 14–54)
AST: 20 U/L (ref 15–41)
Albumin: 3.1 g/dL — ABNORMAL LOW (ref 3.5–5.0)
Alkaline Phosphatase: 89 U/L (ref 38–126)
Anion gap: 12 (ref 5–15)
BUN: 38 mg/dL — ABNORMAL HIGH (ref 6–20)
CO2: 21 mmol/L — ABNORMAL LOW (ref 22–32)
Calcium: 8.7 mg/dL — ABNORMAL LOW (ref 8.9–10.3)
Chloride: 99 mmol/L — ABNORMAL LOW (ref 101–111)
Creatinine, Ser: 1.98 mg/dL — ABNORMAL HIGH (ref 0.44–1.00)
GFR calc Af Amer: 32 mL/min — ABNORMAL LOW (ref >60)
GFR calc non Af Amer: 27 mL/min — ABNORMAL LOW (ref >60)
Glucose, Bld: 231 mg/dL — ABNORMAL HIGH (ref 65–99)
Potassium: 4.2 mmol/L (ref 3.5–5.1)
Sodium: 132 mmol/L — ABNORMAL LOW (ref 135–145)
Total Bilirubin: 0.7 mg/dL (ref 0.3–1.2)
Total Protein: 7.2 g/dL (ref 6.5–8.1)

## 2016-07-23 LAB — CBC WITH DIFFERENTIAL/PLATELET
Basophils Absolute: 0.1 10*3/uL (ref 0.0–0.1)
Basophils Relative: 0 %
Eosinophils Absolute: 0.2 10*3/uL (ref 0.0–0.7)
Eosinophils Relative: 1 %
HCT: 33.9 % — ABNORMAL LOW (ref 36.0–46.0)
Hemoglobin: 11.7 g/dL — ABNORMAL LOW (ref 12.0–15.0)
Lymphocytes Relative: 10 %
Lymphs Abs: 2.5 10*3/uL (ref 0.7–4.0)
MCH: 30.4 pg (ref 26.0–34.0)
MCHC: 34.5 g/dL (ref 30.0–36.0)
MCV: 88.1 fL (ref 78.0–100.0)
Monocytes Absolute: 1.8 10*3/uL — ABNORMAL HIGH (ref 0.1–1.0)
Monocytes Relative: 8 %
Neutro Abs: 19.2 10*3/uL — ABNORMAL HIGH (ref 1.7–7.7)
Neutrophils Relative %: 81 %
Platelets: 361 10*3/uL (ref 150–400)
RBC: 3.85 MIL/uL — ABNORMAL LOW (ref 3.87–5.11)
RDW: 12.2 % (ref 11.5–15.5)
WBC: 23.7 10*3/uL — ABNORMAL HIGH (ref 4.0–10.5)

## 2016-07-23 LAB — I-STAT CG4 LACTIC ACID, ED
Lactic Acid, Venous: 1.26 mmol/L (ref 0.5–1.9)
Lactic Acid, Venous: 0.93 mmol/L (ref 0.5–1.9)

## 2016-07-23 LAB — GLUCOSE, CAPILLARY: Glucose-Capillary: 242 mg/dL — ABNORMAL HIGH (ref 65–99)

## 2016-07-23 LAB — LIPASE, BLOOD: Lipase: 52 U/L — ABNORMAL HIGH (ref 11–51)

## 2016-07-23 LAB — C-REACTIVE PROTEIN: CRP: 16.1 mg/dL — ABNORMAL HIGH (ref <1.0)

## 2016-07-23 LAB — SEDIMENTATION RATE: Sed Rate: 128 mm/hr — ABNORMAL HIGH (ref 0–22)

## 2016-07-23 MED ORDER — DEXTROSE 5 % IV SOLN
2.0000 g | Freq: Once | INTRAVENOUS | Status: AC
  Administered 2016-07-23: 2 g via INTRAVENOUS
  Filled 2016-07-23: qty 2

## 2016-07-23 MED ORDER — ACETAMINOPHEN 650 MG RE SUPP: 650.0000 mg | Freq: Four times a day (QID) | RECTAL | Status: DC | PRN

## 2016-07-23 MED ORDER — ACETAMINOPHEN 325 MG PO TABS: 650.0000 mg | ORAL_TABLET | Freq: Four times a day (QID) | ORAL | Status: DC | PRN

## 2016-07-23 MED ORDER — DEXTROSE 5 % IV SOLN
2.0000 g | INTRAVENOUS | Status: DC
  Filled 2016-07-23: qty 2

## 2016-07-23 MED ORDER — DOXYCYCLINE HYCLATE 100 MG PO TABS
100.0000 mg | ORAL_TABLET | Freq: Two times a day (BID) | ORAL | Status: DC
  Administered 2016-07-23 – 2016-07-24 (×3): 100 mg via ORAL
  Filled 2016-07-23 (×3): qty 1

## 2016-07-23 MED ORDER — LEVOTHYROXINE SODIUM 150 MCG PO TABS
150.0000 ug | ORAL_TABLET | Freq: Every day | ORAL | Status: DC
Start: 2016-07-24 — End: 2016-07-25
  Administered 2016-07-24 – 2016-07-25 (×2): 150 ug via ORAL
  Filled 2016-07-23: qty 1
  Filled 2016-07-23: qty 2
  Filled 2016-07-23: qty 1

## 2016-07-23 MED ORDER — LORAZEPAM 2 MG/ML IJ SOLN
0.5000 mg | Freq: Once | INTRAMUSCULAR | Status: AC | PRN
Start: 2016-07-23 — End: 2016-07-23
  Administered 2016-07-23: 0.5 mg via INTRAVENOUS
  Filled 2016-07-23: qty 1

## 2016-07-23 MED ORDER — SODIUM CHLORIDE 0.9 % IV SOLN
INTRAVENOUS | Status: AC
  Administered 2016-07-23: 20:00:00 via INTRAVENOUS

## 2016-07-23 MED ORDER — DULOXETINE HCL 60 MG PO CPEP
60.0000 mg | ORAL_CAPSULE | Freq: Every day | ORAL | Status: DC
  Administered 2016-07-23 – 2016-07-25 (×3): 60 mg via ORAL
  Filled 2016-07-23 (×3): qty 1

## 2016-07-23 MED ORDER — INSULIN ASPART 100 UNIT/ML ~~LOC~~ SOLN
0.0000 [IU] | Freq: Three times a day (TID) | SUBCUTANEOUS | Status: DC
  Administered 2016-07-24: 2 [IU] via SUBCUTANEOUS
  Administered 2016-07-24: 5 [IU] via SUBCUTANEOUS
  Administered 2016-07-24 – 2016-07-25 (×3): 3 [IU] via SUBCUTANEOUS

## 2016-07-23 MED ORDER — AMLODIPINE BESYLATE 5 MG PO TABS
5.0000 mg | ORAL_TABLET | ORAL | Status: DC
  Administered 2016-07-23 – 2016-07-25 (×2): 5 mg via ORAL
  Filled 2016-07-23 (×2): qty 1

## 2016-07-23 MED ORDER — SODIUM CHLORIDE 0.9 % IV BOLUS (SEPSIS)
1000.0000 mL | Freq: Once | INTRAVENOUS | Status: AC
  Administered 2016-07-23: 1000 mL via INTRAVENOUS

## 2016-07-23 MED ORDER — CLONAZEPAM 0.5 MG PO TABS
0.5000 mg | ORAL_TABLET | Freq: Every day | ORAL | Status: DC
  Administered 2016-07-23 – 2016-07-24 (×2): 0.5 mg via ORAL
  Filled 2016-07-23 (×2): qty 1

## 2016-07-23 MED ORDER — ENOXAPARIN SODIUM 30 MG/0.3ML ~~LOC~~ SOLN
30.0000 mg | SUBCUTANEOUS | Status: DC
  Administered 2016-07-23 – 2016-07-24 (×2): 30 mg via SUBCUTANEOUS
  Filled 2016-07-23 (×3): qty 0.3

## 2016-07-23 MED ORDER — METRONIDAZOLE 500 MG PO TABS
500.0000 mg | ORAL_TABLET | Freq: Three times a day (TID) | ORAL | Status: DC
  Administered 2016-07-23 – 2016-07-24 (×3): 500 mg via ORAL
  Filled 2016-07-23 (×3): qty 1

## 2016-07-23 MED ORDER — ROSUVASTATIN CALCIUM 40 MG PO TABS
40.0000 mg | ORAL_TABLET | Freq: Every day | ORAL | Status: DC
  Administered 2016-07-23 – 2016-07-24 (×2): 40 mg via ORAL
  Filled 2016-07-23: qty 4
  Filled 2016-07-23 (×3): qty 1
  Filled 2016-07-23: qty 4

## 2016-07-23 MED ORDER — INSULIN GLARGINE 100 UNIT/ML ~~LOC~~ SOLN
20.0000 [IU] | Freq: Two times a day (BID) | SUBCUTANEOUS | Status: DC
  Administered 2016-07-23 – 2016-07-25 (×4): 20 [IU] via SUBCUTANEOUS
  Filled 2016-07-23 (×5): qty 0.2

## 2016-07-23 NOTE — H&P (Signed)
Date: 07/23/2016               Patient Name:  Suzanne Allen MRN: [id number]  DOB: 1961-06-13 Age / Sex: 56 y.o., female   PCP: Barrie Lyme, FNP         Medical Service: Internal Medicine Teaching Service         Attending Physician: Dr. Gilles Chiquito    First Contact: Dr. Asencion Partridge Pager: [phone]  Second Contact: Dr. Zada Finders Pager: [phone]       After Hours (After 5p/  First Contact Pager: [phone]  weekends / holidays): Second Contact Pager: [phone]   Chief Complaint: Leg pain  History of Present Illness: Suzanne Allen is a 56 y.o. woman with PMH of poorly-controlled T1DM, retinopathy, CKD, osteomyelitis s/p right transmet amputation 06/2015, HTN, HLD, chronic pancreatitis, CVA/TIA who presents to the ED complaining of progressive left foot and leg pain that began this morning, with associated redness, warmth, and a 2 cm dark blister over her left big toe. No drainage. She reports the pain is burning in character and shoots up the front of her left shin to her groin. Her pain is moderate and she is comfortable without medications for pain. She washed and cleaned her feet last night and noticed no abnormality. She has had a deep dry crack/crevice in the fold under her left great toe for about a month. She saw her primary orthopedic doctor Sharol Given) last week and they recommended applying bacterial cream and Band-Aids to this site, as well as adjusting her footwear. However had yet to receive her new footwear, and reported that the Band-Aids often would slide into the crevice and cut into her toe, so she stopped using them. She reports chills and nausea yesterday but no vomiting, fever, constipation, diarrhea, dysuria, abdominal pain, numbness, paresthesias, myalgias, congestion, or cough. She denies walking barefoot or exposing her feet to fresh or saltwater. She reports some difficulty controlling her blood sugar recently, and her glucose this morning was 457.   In the ED - vitals T  98.1, HR 86, RR 18, BP 127/55, SpO2 99% on RA with erythema, pain, warmth of LLE. Left foot XR revealed soft tissue abnormality along the lateral aspect of the first toe and no associated acute osseous abnormality. Labs remarkable for WBC 23.7, Hb 11.7, Na 132, CO2 21, glucose 231, BUN 38, Cr 1.98 (baseline 1.2-1.5), lipase 52, lactic acid normal at 1.26. Cultures drawn and started on Doxycycline, Flagyl, IV Cefepime, and IV fluids. IMTS contacted for admission.  Meds:  Current Meds  Medication Sig  . amLODipine (NORVASC) 5 MG tablet Take 1 tablet by mouth every other day.  . clonazePAM (KLONOPIN) 0.5 MG tablet Take 1 tablet (0.5 mg total) by mouth at bedtime.  . CRESTOR 40 MG tablet Take 1 tablet by mouth daily  . DULoxetine (CYMBALTA) 60 MG capsule Take 1 capsule (60 mg total) by mouth daily.  . insulin aspart (NOVOLOG) 100 UNIT/ML injection Inject 20 Units into the skin 3 (three) times daily before meals.   . insulin glargine (LANTUS) 100 UNIT/ML injection Inject 40 Units into the skin 2 (two) times daily.  Marland Kitchen levothyroxine (SYNTHROID, LEVOTHROID) 150 MCG tablet Take 150 mcg by mouth daily before breakfast.  . lisinopril-hydrochlorothiazide (PRINZIDE,ZESTORETIC) 20-25 MG tablet Take 1 tablet by mouth every morning.    Allergies: Allergies as of 07/23/2016 - Review Complete 07/23/2016  Allergen Reaction Noted  . Erythromycin Anaphylaxis 02/20/2011  . Vancomycin Shortness Of Breath  and Other (See Comments) 09/22/2012   Past Medical History:  Diagnosis Date  . 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 Anxiety   . CAP (community acquired pneumonia) 03/13/2015  . Carpal tunnel syndrome 11/28/2014   Bilateral  . Chronic pancreatitis ([employer])   . Depression   . Diabetes mellitus type I ([place]) dx'd 1977  . Diabetic osteomyelitis ([place])    right great toe/progress notes 09/23/2012  . Diabetic retinopathy ([place])   . Fatty liver 04/04/2014  . HTN (hypertension)   .  Hypercholesteremia   . Hypothyroidism   . Macular degeneration, bilateral   . Migraines 1980's   "when I was in my 20's" (03/14/2015)  . Peripheral neuropathy ([place])   . Pneumonia 1993  . PTSD (post-traumatic stress disorder)   . Stroke [facility]) ? date & 09/22/2012  . TIA (transient ischemic attack) 09/22/2012   [name] 10/25/2012    Family History:  Family History  Problem Relation Age of Onset  . Depression Mother   . Diabetes Mother   . Alcohol abuse Father   . Alcohol abuse Sister   . Diabetes Maternal Grandmother     Social History:  Social History   Social History  . Marital status: Widowed    Spouse name: N/A  . Number of children: N/A  . Years of education: N/A   Occupational History  . Not on file.   Social History Main Topics  . Smoking status: Never Smoker  . Smokeless tobacco: Never Used  . Alcohol use No  . Drug use: No  . Sexual activity: Not Currently   Other Topics Concern  . Not on file   Social History Narrative   Lives in [place] with daughter and father. Completely independent in ADLs, IADLs.    Review of Systems: A complete ROS was negative except as per HPI.   Physical Exam: Blood pressure (!) 141/66, pulse 90, temperature 99.1 F (37.3 C), resp. rate 18, height 5\' 2"  (1.575 m), weight 155 lb (70.3 kg), SpO2 99 %.  General appearance: Obese woman resting comfortably in bed, in no distress, pleasant and conversational  HENT: Normocephalic, atraumatic, moist mucous membranes, oropharynx clear, moon face Eyes:Pupils equal bilaterally but minimally reactive to light (chronic), EOM intact, non-icteric Cardiovascular: Regular rate and rhythm, no murmurs, rubs, gallops Respiratory/Chest: Clear to auscultation bilaterally, normal work of breathing, dorsal fat pad present Abdomen: Obese, BS+, soft, non-tender, non-distended, no striae Extremities: Thin extremities, right foot stump with dry cracked callus across the forefoot, left foot with 2-3 cm  diameter dark blister over great toe with spreading erythema up to ankle, dry callused crack under great toe without drainage, warm and tender to touch, tender along anterior aspect of tibia, no edema, 2+ DP, PT, radial pulses. Cap refill < 2 sec Skin: Warm, dry, intact - foot changed per above Neuro: Alert and oriented, strength and sensation grossly intact Psych: Appropriate affect, clear speech, thoughts linear and goal-directed  Assessment & Plan by Problem:  Cellulitis of the left foot, 1 day of acute spreading erythema, pain, warmth, blister from left great toe adjacent to known area of skin breakdown with chills, nausea, poorly controlled T1DM, history of osteomyelitis of right toes/foot requiring transmet amputation 1 year ago via Dr. Sharol Given. Leukocytosis to 23.7, afebrile, HDS, good pulses, cap refill and sensation. XR with evidence of soft tissue involvement but no bone involvement. Vancomycin allergy. Started on empiric po Doxycycline, Flagyl, and IV Cefepime on 1/11.  -- MR left foot (wo  contrast given AKI) -- Cefepime per pharm consult -- Doxycycline 100 mg BID -- Flagyl 500 mg Q8H -- Optimize glycemic control -- Follow blood cultures -- Trend CBC -- Tylenol PRN for aches, fever -- IV fluids 100 cc/hr  T1DM, poorly-controlled, since 1970s, last A1c 9.4 in 06/09/2016 on Lantus 40 BID, and Novolog 20 TID WC at home. Renal, retinal, peripheral nerve involvement. -- Lantus 20 U BID -- SSI moderate TID WC, CBGs TID WC HS  AKI on CKD, creatinine approx 2.0 from baseline 1.2-1.5 per care everywhere, previous baseline 0.8-0.9 in 2016, possible background progressive CKD.  - s/p 1L NS bolus in ED - NS 100 cc/hr for 12 hours overnight - Trend BMP  Anxiety/insomnia - continue home Klonopin QHS Depression - continue home Cymbalta QD Hypothyroidism - continue home Levothyroxine HLD - continue home Crestor HTN - normotensive, continue home Amlodipine, hold home Lisinopril-HCTZ in setting  of AKI  FEN/GI: CM diet, replete electrolytes as needed  DVT ppx: Lovenox  Code status: Full  Dispo: Admit patient to Observation with expected length of stay less than 2 midnights.  Signed: Asencion Partridge, MD 07/23/2016, 5:56 PM  Pager: [phone]

## 2016-07-23 NOTE — H&P (Signed)
Date: 07/23/2016               Patient Name:  Suzanne Allen MRN: [id number]  DOB: 05/10/1961 Age / Sex: 56 y.o., female   PCP: Barrie Lyme, FNP              Medical Service: Internal Medicine Teaching Service              Attending Physician: Dr. Sid Falcon, MD    First Contact: Marylen Ponto, MS3 Pager: [phone]  Second Contact: Dr. Wynetta Emery Pager: [phone]  Third Contact Dr. Posey Pronto Pager: [phone]       After Hours (After 5p/  First Contact Pager: [phone]  weekends / holidays): Second Contact Pager: [phone]   Chief Complaint: Foot pain  History of Present Illness:  Ms. 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  She says that she has noticed a small crack in her skin under her big toe since around Dec 1 and has been monitoring it closely.  Yesterday she had some mild nausea and chills, but symptoms related to her foot all started this morning when she noticed a "firey" / "tingly" pain that shoots up her left leg up to her groin, but is most notable over her left anterior lower leg. She does note some mild loss of sensation in her foot at baseline.  She denies any exposure to open water in her leg or new foot creams or soaps.  Overall, she states that this is extremely similar to her last experience with osteomyelitis eventually requiring transmetatarsal amputation of her right foot.    Additionally, she has had type 1 diabetes since age 24, but has noticed worse control of her blood sugars recently with home BG around 457 in the AM.  She does daily foot care and has seen Dr. Sharol Given in clinic within the last week.  She attempted to bandage her foot, but noted that they slipped and cut into her foot.  She therefore stopped using bandages in this area.  In the ED,  blood cultures were taken, she was started on cefepime, doxycycline and metronidazole and XR of the L foot was obtained.  She says that she gets Red Man Syndrome with Vancyomycin, but also had extreme vomiting and altered mental status when she was given a prolonged course of vancyomycin through her PICC line.   Meds: Current Facility-Administered Medications  Medication Dose Route Frequency Provider Last Rate Last Dose  . acetaminophen (TYLENOL) tablet 650 mg  650 mg Oral Q6H PRN Zada Finders, MD       Or  . acetaminophen (TYLENOL) suppository 650 mg  650 mg Rectal Q6H PRN Zada Finders, MD      . amLODipine (NORVASC) tablet 5 mg  5 mg Oral QODAY Zada Finders, MD      . Derrill Memo ON 07/24/2016] ceFEPIme (MAXIPIME) 2 g in dextrose 5 % 50 mL IVPB  2 g Intravenous Q24H Rebecka Apley, [facility]      . clonazePAM (KLONOPIN) tablet 0.5 mg  0.5 mg Oral QHS Zada Finders, MD      . doxycycline (VIBRA-TABS) tablet 100 mg  100 mg Oral Q12H Gwenyth Allegra Tegeler, MD   100 mg at 07/23/16 1439  . DULoxetine (CYMBALTA) DR capsule  60 mg  60 mg Oral Daily Zada Finders, MD      . enoxaparin (LOVENOX) injection 30 mg  30 mg Subcutaneous Q24H Zada Finders, MD      . Derrill Memo ON 07/24/2016] insulin aspart (novoLOG) injection 0-15 Units  0-15 Units Subcutaneous TID WC Zada Finders, MD      . insulin glargine (LANTUS) injection 20 Units  20 Units Subcutaneous BID Zada Finders, MD      . Derrill Memo ON 07/24/2016] levothyroxine (SYNTHROID, LEVOTHROID) tablet 150 mcg  150 mcg Oral QAC breakfast Zada Finders, MD      . metroNIDAZOLE (FLAGYL) tablet 500 mg  500 mg Oral Q8H Gwenyth Allegra Tegeler, MD   500 mg at 07/23/16 1439  . rosuvastatin (CRESTOR) tablet 40 mg  40 mg Oral Daily Zada Finders, MD      . sodium chloride 0.9 % bolus 1,000 mL  1,000 mL Intravenous Once [name], MD       Current Outpatient Prescriptions  Medication Sig Dispense Refill  . amLODipine (NORVASC) 5 MG tablet Take 1 tablet by mouth every other day.      . clonazePAM (KLONOPIN) 0.5 MG tablet Take 1 tablet (0.5 mg total) by mouth at bedtime. 30 tablet 2  . CRESTOR 40 MG tablet Take 1 tablet by mouth daily  5  . DULoxetine (CYMBALTA) 60 MG capsule Take 1 capsule (60 mg total) by mouth daily. 30 capsule 2  . insulin aspart (NOVOLOG) 100 UNIT/ML injection Inject 20 Units into the skin 3 (three) times daily before meals.     . insulin glargine (LANTUS) 100 UNIT/ML injection Inject 40 Units into the skin 2 (two) times daily.    Marland Kitchen levothyroxine (SYNTHROID, LEVOTHROID) 150 MCG tablet Take 150 mcg by mouth daily before breakfast.    . lisinopril-hydrochlorothiazide (PRINZIDE,ZESTORETIC) 20-25 MG tablet Take 1 tablet by mouth every morning.      Allergies: Allergies as of 07/23/2016 - Review Complete 07/23/2016  Allergen Reaction Noted  . Erythromycin Anaphylaxis 02/20/2011  . Vancomycin Shortness Of Breath and Other (See Comments) 09/22/2012   Past Medical History:  Diagnosis Date  . 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 Anxiety   . CAP (community acquired pneumonia) 03/13/2015  . Carpal tunnel syndrome 11/28/2014   Bilateral  . Chronic pancreatitis ([place])   . Depression   . Diabetes mellitus type I ([place]) dx'd 1977  . Diabetic osteomyelitis ([place])    right great toe/progress notes 09/23/2012  . Diabetic retinopathy ([place])   . Fatty liver 04/04/2014  . HTN (hypertension)   . Hypercholesteremia   . Hypothyroidism   . Macular degeneration, bilateral   . Migraines 1980's   "when I was in my 20's" (03/14/2015)  . Peripheral neuropathy ([place])   . Pneumonia 1993  . PTSD (post-traumatic stress disorder)   . Stroke [facility]) ? date & 09/22/2012  . TIA (transient ischemic attack) 09/22/2012   [name] 10/25/2012   Past Surgical History:  Procedure Laterality Date  . AMPUTATION Right 07/12/2015   Procedure: Right Transmetatarsal amputation;  Surgeon: Newt Minion, MD;  Location: [facility];  Service: Orthopedics;  Laterality: Right;  .  [place]; 1995  . EYE SURGERY Bilateral    "hemorrhaged behind my eye"  . I&D EXTREMITY Right multiple   "big toe"  . PERIPHERALLY INSERTED CENTRAL CATHETER INSERTION Right 08/31/2012  . PICC LINE REMOVAL ([place] [facility]X) Right 10/12/2012   [name] 10/12/2012  . REFRACTIVE SURGERY Bilateral 2013  .  TOE AMPUTATION Right 11/2012   "big toe"  . TONSILLECTOMY  1965  . TOTAL THYROIDECTOMY  2012  . TUBAL LIGATION  1995   Family History  Problem Relation Age of Onset  . Depression Mother   . Diabetes Mother   . Alcohol abuse Father   . Alcohol abuse Sister   . Diabetes Maternal Grandmother    Social History   Social History  . Marital status: Widowed    Spouse name: N/A  . Number of children: N/A  . Years of education: N/A   Occupational History  . Not on file.   Social History Main Topics  . Smoking status: Never Smoker  . Smokeless tobacco: Never Used  . Alcohol use No  . Drug use: No  . Sexual activity: Not Currently   Other Topics Concern  . Not on file   Social History Narrative   Lives in [place] with daughter and father.    Review of Systems: Review of Systems  Constitutional: Positive for chills. Negative for fever.  HENT: Negative for congestion and sore throat.   Respiratory: Negative for cough, shortness of breath and wheezing.   Cardiovascular: Negative for chest pain and palpitations.  Gastrointestinal: Positive for abdominal pain and nausea. Negative for diarrhea.  Genitourinary: Negative for dysuria and hematuria.  Skin: Positive for rash.  Neurological: Negative for headaches.    Physical Exam: Blood pressure 129/71, pulse 90, temperature 98.1 F (36.7 C), temperature source Oral, resp. rate 18, height '5\' 2"'  (1.575 m), weight 70.3 kg (155 lb), SpO2 97 %. Gen: Well-appearing in NAD HEENT: NCAT. EOMI. Pupils minimally reactive to light. Moist mucous membranes.  CV: RRR with no murmurs, rubs, or gallops. Pulm:  CTAB, no wheezes or  rhonchi. Abdomen: Soft, mildly tender to palpation in the epigastric region.  Normal BS appreciated.  Extremities: ~2 cm blister on L great toe with red streaking up to anterior ankle. Small opening in skin under L first MP joint. Sensation grossly intact on dorsal and plantar surfaces of L foot, but mildly diminished at tips of toes.  L DP intact.  R foot s/p transmetarsal amputation.    Lab results: CBC Latest Ref Rng & Units 07/23/2016 07/13/2015 07/12/2015  WBC 4.0 - 10.5 K/uL 23.7(H) 13.4(H) 11.0(H)  Hemoglobin 12.0 - 15.0 g/dL 11.7(L) 10.7(L) 11.6(L)  Hematocrit 36.0 - 46.0 % 33.9(L) 30.9(L) 35.8(L)  Platelets 150 - 400 K/uL 361 356 443(H)   BMP Latest Ref Rng & Units 07/23/2016 07/13/2015 07/12/2015  Glucose 65 - 99 mg/dL 231(H) 239(H) 196(H)  BUN 6 - 20 mg/dL 38(H) 16 19  Creatinine 0.44 - 1.00 mg/dL 1.98(H) 0.85 0.93  Sodium 135 - 145 mmol/L 132(L) 140 139  Potassium 3.5 - 5.1 mmol/L 4.2 5.2(H) 4.4  Chloride 101 - 111 mmol/L 99(L) 107 107  CO2 22 - 32 mmol/L 21(L) 23 23  Calcium 8.9 - 10.3 mg/dL 8.7(L) 8.5(L) 8.7(L)    Imaging results:  Dg Foot Complete Left  Result Date: 07/23/2016 CLINICAL DATA:  56 year old female with left foot infections; large blister on the left great toe with redness in the distal metatarsals 1 through 3. No known injury. Initial encounter. EXAM: LEFT FOOT - COMPLETE 3+ VIEW COMPARISON:  Left foot series 31540. FINDINGS: Large area soft tissue swelling evident along the lateral aspect of the left first toe. No subcutaneous gas. No cortical osteolysis in the left first ray. Degenerative changes at the first MTP joint with medial partially calcified tophus. The first IP joint  space is normal. Healed fractures of the second through fifth proximal phalanges. The metatarsals appear intact. Tarsal bones appear intact, degenerative spurring at the calcaneus. Tarsal joint spaces are preserved. No acute fracture or dislocation. No cortical osteolysis. IMPRESSION: Soft  tissue abnormality maximal along the lateral aspect of the first toe. No associated acute osseous abnormality. No subcutaneous gas. Electronically Signed   By: Genevie Ann M.D.   On: 07/23/2016 13:25    Other results:  Assessment & Plan by Problem: Active Problems:   Cellulitis of left foot  Left foot infection / Cellulitis: Started 1/10 overnight per patient with erythema and pain running up leg and ~ 1 month history of opening in skin.  She has a recent history of diabetic osteomyelitis that started similar to this that eventually resulted in a transmetatarsal amputation of her right foot. - Cefepime, Doxycycline, Metronidazole given her allergy/ reaction to Vancyomycin - ESR/ CRP for assessment of osteomyelitis - MRI for soft tissue resolution of left foot  T1DM: Home regimen includes 40 lantus BID and aspart 20u before meals. - start on lantus 20 u with SSI   AKI in the context of chronic kidney disease: Serum Cr at 1.98 on admission.  Likely pre-renal given her nausea and limited PO intake yesterday. - IVF at 100 cc/hr  Hypertension:  - continue home amlodopine 5 mg   This is a Careers information officer Note.  The care of the patient was discussed with Dr. Wynetta Emery and the assessment and plan was formulated with their assistance.  Please see their note for official documentation of the patient encounter.   Signed: Doug Sou, Medical Student 07/23/2016, 5:20 PM

## 2016-07-23 NOTE — ED Triage Notes (Signed)
Per Pt, Pt is coming from home with complaints of erythema, edema, and blister noted to the left greater toe. Pt reports pain radiating through her leg. Hx of partial amputation of the right leg.

## 2016-07-23 NOTE — Progress Notes (Signed)
Pharmacy Antibiotic Note  Suzanne Allen is a 56 y.o. female admitted on 07/23/2016 with severe DM foot infection.  Pharmacy has been consulted for cefepime dosing.  Patient is getting doxycycline 100mg  q12h, flagyl 500mg  q8h and an initial dose of cefepime 2g IV once in the ED.  Plan: Cefepime 2g IV q24h Monitor culture data, renal function and clinical course  Height: 5\' 2"  (157.5 cm) Weight: 155 lb (70.3 kg) IBW/kg (Calculated) : 50.1  Temp (24hrs), Avg:98.1 F (36.7 C), Min:98.1 F (36.7 C), Max:98.1 F (36.7 C)   Recent Labs Lab 07/23/16 1253 07/23/16 1308  WBC 23.7*  --   CREATININE 1.98*  --   LATICACIDVEN  --  1.26    Estimated Creatinine Clearance: 29.8 mL/min (by C-G formula based on SCr of 1.98 mg/dL (H)).    Allergies  Allergen Reactions  . Erythromycin Anaphylaxis  . Vancomycin Other (See Comments)    Red Man Syndrome     Andrey Cota. Diona Foley, PharmD, [place] Clinical Pharmacist Pager [phone] 07/23/2016 1:26 PM

## 2016-07-23 NOTE — ED Provider Notes (Signed)
[facility] DEPT Provider Note    By signing my name below, I, [name], attest that this documentation has been prepared under the direction and in the presence of [name], MD. Electronically Signed: [name], ED Scribe. 07/23/16. 4:49 PM.    History   Chief Complaint Chief Complaint  Patient presents with  . Foot Pain   The history is provided by the patient and medical records. No language interpreter was used.    Suzanne Allen is a 56 y.o. female with PMHx of DM who presents to the Emergency Department complaining of worsening, moderate burning, "firey" left foot pain that began yesterday. She reports associated warmth, rapidly progressing redness and swelling of the right foot and states the pain radiates up her LLE to her groin. She rates her pain in the groin at 6/10. She reports chills and nausea yesterday. Pt reports "flutters" of her abdomen but denies pain. She states she saw the doctor two days ago and had no symptoms at all.  She has taken Ibuprofen for pain with minimal relief. Pt denies modifying factors. She denies dysuria, vomiting, fever, constipation, diarrhea, abdominal pain, rhinorrhea, congestion, cough, numbness, tingling or weakness of the RLE. She denies any known trauma or injury. She denies h/o DVT. She has h/o osteomyelitis one year ago in the right foot.    Past Medical History:  Diagnosis Date  . 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 Anxiety   . CAP (community acquired pneumonia) 03/13/2015  . Carpal tunnel syndrome 11/28/2014   Bilateral  . Chronic pancreatitis ([place])   . Depression   . Diabetes mellitus type I ([place]) dx'd 1977  . Diabetic osteomyelitis ([place])    right great toe/progress notes 09/23/2012  . Diabetic retinopathy ([place])   . Fatty liver 04/04/2014  . HTN (hypertension)   . Hypercholesteremia   . Hypothyroidism   . Macular degeneration, bilateral   . Migraines 1980's   "when I was in my  20's" (03/14/2015)  . Peripheral neuropathy ([place])   . Pneumonia 1993  . PTSD (post-traumatic stress disorder)   . Stroke [facility]) ? date & 09/22/2012  . TIA (transient ischemic attack) 09/22/2012   [name] 10/25/2012    Patient Active Problem List   Diagnosis Date Noted  . Cellulitis of left foot 07/23/2016  . Status post transmetatarsal amputation of foot, right ([place]) 07/17/2016  . Major depressive disorder, recurrent episode, mild ([place]) 05/04/2016  . Osteomyelitis ([place]) 07/08/2015  . Poorly controlled diabetes mellitus ([place]) 07/08/2015  . Depression with anxiety 07/08/2015  . Lactic acid acidosis 07/08/2015  . BP (high blood pressure) 06/17/2015  . 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 Hypothyroidism, postop 06/17/2015  . 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 Proliferative diabetic retinopathy ([place]) 06/17/2015  . Dysesthesia 06/17/2015  . CAP (community acquired pneumonia) 03/13/2015  . Dyspnea 03/13/2015  . Respiratory failure with hypoxia ([place]) 03/13/2015  . Pericardial effusion 03/13/2015  . Depression 02/15/2015  . Carpal tunnel syndrome 11/28/2014  . Diabetes, polyneuropathy ([place]) 11/15/2014  . Numbness 11/15/2014  . Gait disturbance 11/15/2014  . Hand weakness 11/15/2014  . Insomnia 11/15/2014  . DDD (degenerative disc disease), lumbar 04/10/2014  . Diarrhea 04/05/2014  . Dyslipidemia 04/04/2014  . 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 Fatty liver 04/04/2014  . Acute pulmonary edema ([place]) 04/04/2014  . Hypokalemia 04/04/2014  . Elevated LFTs 04/04/2014  . Benign essential HTN 04/02/2014  . Hypothyroidism 04/02/2014  . Diabetes ([place]) 10/10/2013  .  Pancreatitis, acute 10/10/2013  . Leukocytosis 10/04/2013  . Acute pancreatitis 10/04/2013  . Diabetic neuropathy ([place]) 10/03/2013  . TIA (transient ischemic attack) 09/23/2012  . Temporary cerebral vascular dysfunction 09/23/2012  . Diabetic osteomyelitis ([place]) 08/29/2012  .  Diabetes type 1, uncontrolled ([place]) 08/29/2012  . Type 1 diabetes mellitus ([place]) 08/29/2012  . Type 2 diabetes mellitus ([place]) 08/29/2012  . Diabetic foot ulcer ([place]) 07/27/2012  . Anxiety 08/24/2011  . Arthralgia of multiple joints 10/01/2010  . HLD (hyperlipidemia) 04/25/2010  . Avitaminosis D 04/25/2010  . Allergic rhinitis 08/16/2008    Past Surgical History:  Procedure Laterality Date  . AMPUTATION Right 07/12/2015   Procedure: Right Transmetatarsal amputation;  Surgeon: Newt Minion, MD;  Location: [facility];  Service: Orthopedics;  Laterality: Right;  . [place]; 1995  . EYE SURGERY Bilateral    "hemorrhaged behind my eye"  . I&D EXTREMITY Right multiple   "big toe"  . PERIPHERALLY INSERTED CENTRAL CATHETER INSERTION Right 08/31/2012  . PICC LINE REMOVAL ([place] HX) Right 10/12/2012   [name] 10/12/2012  . REFRACTIVE SURGERY Bilateral 2013  . TOE AMPUTATION Right 11/2012   "big toe"  . TONSILLECTOMY  1965  . TOTAL THYROIDECTOMY  2012  . TUBAL LIGATION  1995    OB History    No data available       Home Medications    Prior to Admission medications   Medication Sig Start Date End Date Taking? Authorizing Provider  amLODipine (NORVASC) 5 MG tablet Take 1 tablet by mouth every other day. 01/03/16  Yes Historical Provider, MD  clonazePAM (KLONOPIN) 0.5 MG tablet Take 1 tablet (0.5 mg total) by mouth at bedtime. 05/04/16  Yes Norman Clay, MD  CRESTOR 40 MG tablet Take 1 tablet by mouth daily 11/06/14  Yes Historical Provider, MD  DULoxetine (CYMBALTA) 60 MG capsule Take 1 capsule (60 mg total) by mouth daily. 05/04/16  Yes Norman Clay, MD  insulin aspart (NOVOLOG) 100 UNIT/ML injection Inject 20 Units into the skin 3 (three) times daily before meals.    Yes Historical Provider, MD  insulin glargine (LANTUS) 100 UNIT/ML injection Inject 40 Units into the skin 2 (two) times daily.   Yes Historical Provider, MD  levothyroxine (SYNTHROID, LEVOTHROID) 150 MCG tablet Take  150 mcg by mouth daily before breakfast.   Yes Historical Provider, MD  lisinopril-hydrochlorothiazide (PRINZIDE,ZESTORETIC) 20-25 MG tablet Take 1 tablet by mouth every morning. 03/12/15  Yes Historical Provider, MD    Family History Family History  Problem Relation Age of Onset  . Depression Mother   . Diabetes Mother   . Alcohol abuse Father   . Alcohol abuse Sister   . Diabetes Maternal Grandmother     Social History Social History  Substance Use Topics  . Smoking status: Never Smoker  . Smokeless tobacco: Never Used  . Alcohol use No     Allergies   Erythromycin and Vancomycin   Review of Systems Review of Systems  Constitutional: Positive for chills. Negative for activity change, diaphoresis, fatigue and fever.  HENT: Negative for congestion and rhinorrhea.   Eyes: Negative for visual disturbance.  Respiratory: Negative for cough, chest tightness, shortness of breath and stridor.   Cardiovascular: Negative for chest pain, palpitations and leg swelling.  Gastrointestinal: Negative for abdominal distention, abdominal pain, constipation, diarrhea, nausea and vomiting.  Genitourinary: Negative for difficulty urinating, dysuria, flank pain, frequency, hematuria, menstrual problem, pelvic pain, vaginal bleeding and vaginal discharge.  Musculoskeletal: Positive for arthralgias and  joint swelling. Negative for back pain and neck pain.  Skin: Positive for color change. Negative for rash and wound.  Neurological: Negative for dizziness, weakness, light-headedness, numbness and headaches.  Psychiatric/Behavioral: Negative for agitation and confusion.  All other systems reviewed and are negative.    Physical Exam Updated Vital Signs BP 132/69 (BP Location: Right Arm)   Pulse 101   Temp 98.1 F (36.7 C) (Oral)   Resp 18   Ht 5\' 2"  (1.575 m)   Wt 155 lb (70.3 kg)   SpO2 100%   BMI 28.35 kg/m   Physical Exam  Constitutional: She is oriented to person, place, and time.  She appears well-developed and well-nourished. No distress.  HENT:  Head: Normocephalic and atraumatic.  Right Ear: External ear normal.  Left Ear: External ear normal.  Nose: Nose normal.  Mouth/Throat: Oropharynx is clear and moist. No oropharyngeal exudate.  Eyes: Conjunctivae and EOM are normal. Pupils are equal, round, and reactive to light.  Neck: Normal range of motion. Neck supple.  Cardiovascular: Normal rate, regular rhythm and normal heart sounds.  Exam reveals no gallop and no friction rub.   No murmur heard. Good pulses.  Pulmonary/Chest: Effort normal and breath sounds normal. No stridor. No respiratory distress. She has no wheezes. She has no rales.  Abdominal: Soft. She exhibits no distension. There is no tenderness. There is no rebound.  Musculoskeletal:  Normal ROM of left ankle and toes.  Lymphadenopathy:       Left: No inguinal adenopathy present.  Neurological: She is alert and oriented to person, place, and time. She has normal reflexes. She exhibits normal muscle tone. Coordination normal.  Decreased sensation of LLE  Skin: Skin is warm. No rash noted. She is not diaphoretic. No erythema.  Right foot with amputation across all metatarsals. Left foot with area of erythema on dorsal surface of foot. 2 cm blister on dorsal aspect of left great toe. Skin crack on underside of left great toe. No purulent drainage. Erythema appears to advance proximally just past ankle. Warmth of entire leg up to knee of LLE. Good cap refill of LLE.  Nursing note and vitals reviewed.    ED Treatments / Results  DIAGNOSTIC STUDIES: Oxygen Saturation is 100% on RA, normal by my interpretation.   COORDINATION OF CARE: 12:36 PM- Will order labs and imaging. Offered pain medication but pt declined. Pt verbalizes understanding and agrees to plan.  Medications  doxycycline (VIBRA-TABS) tablet 100 mg (100 mg Oral Given 07/23/16 1439)  metroNIDAZOLE (FLAGYL) tablet 500 mg (500 mg Oral Given  07/23/16 1439)  ceFEPIme (MAXIPIME) 2 g in dextrose 5 % 50 mL IVPB (not administered)  sodium chloride 0.9 % bolus 1,000 mL (not administered)  enoxaparin (LOVENOX) injection 30 mg (not administered)  acetaminophen (TYLENOL) tablet 650 mg (not administered)    Or  acetaminophen (TYLENOL) suppository 650 mg (not administered)  amLODipine (NORVASC) tablet 5 mg (not administered)  clonazePAM (KLONOPIN) tablet 0.5 mg (not administered)  rosuvastatin (CRESTOR) tablet 40 mg (not administered)  DULoxetine (CYMBALTA) DR capsule 60 mg (not administered)  levothyroxine (SYNTHROID, LEVOTHROID) tablet 150 mcg (not administered)  ceFEPIme (MAXIPIME) 2 g in dextrose 5 % 50 mL IVPB (2 g Intravenous New Bag/Given 07/23/16 1439)    Labs (all labs ordered are listed, but only abnormal results are displayed) Labs Reviewed  CBC WITH DIFFERENTIAL/PLATELET - Abnormal; Notable for the following:       Result Value   WBC 23.7 (*)    RBC  3.85 (*)    Hemoglobin 11.7 (*)    HCT 33.9 (*)    Neutro Abs 19.2 (*)    Monocytes Absolute 1.8 (*)    All other components within normal limits  COMPREHENSIVE METABOLIC PANEL - Abnormal; Notable for the following:    Sodium 132 (*)    Chloride 99 (*)    CO2 21 (*)    Glucose, Bld 231 (*)    BUN 38 (*)    Creatinine, Ser 1.98 (*)    Calcium 8.7 (*)    Albumin 3.1 (*)    GFR calc non Af Amer 27 (*)    GFR calc Af Amer 32 (*)    All other components within normal limits  LIPASE, BLOOD - Abnormal; Notable for the following:    Lipase 52 (*)    All other components within normal limits  SEDIMENTATION RATE - Abnormal; Notable for the following:    Sed Rate 128 (*)    All other components within normal limits  C-REACTIVE PROTEIN - Abnormal; Notable for the following:    CRP 16.1 (*)    All other components within normal limits  CULTURE, BLOOD (ROUTINE X 2)  CULTURE, BLOOD (ROUTINE X 2)  CBC  BASIC METABOLIC PANEL  I-STAT CG4 LACTIC ACID, ED  I-STAT CG4 LACTIC  ACID, ED    EKG  EKG Interpretation None       Radiology Dg Foot Complete Left  Result Date: 07/23/2016 CLINICAL DATA:  56 year old female with left foot infections; large blister on the left great toe with redness in the distal metatarsals 1 through 3. No known injury. Initial encounter. EXAM: LEFT FOOT - COMPLETE 3+ VIEW COMPARISON:  Left foot series 73220. FINDINGS: Large area soft tissue swelling evident along the lateral aspect of the left first toe. No subcutaneous gas. No cortical osteolysis in the left first ray. Degenerative changes at the first MTP joint with medial partially calcified tophus. The first IP joint space is normal. Healed fractures of the second through fifth proximal phalanges. The metatarsals appear intact. Tarsal bones appear intact, degenerative spurring at the calcaneus. Tarsal joint spaces are preserved. No acute fracture or dislocation. No cortical osteolysis. IMPRESSION: Soft tissue abnormality maximal along the lateral aspect of the first toe. No associated acute osseous abnormality. No subcutaneous gas. Electronically Signed   By: Genevie Ann M.D.   On: 07/23/2016 13:25    Procedures Procedures (including critical care time)  Medications Ordered in ED Medications  doxycycline (VIBRA-TABS) tablet 100 mg (100 mg Oral Given 07/23/16 1439)  metroNIDAZOLE (FLAGYL) tablet 500 mg (500 mg Oral Given 07/23/16 1439)  ceFEPIme (MAXIPIME) 2 g in dextrose 5 % 50 mL IVPB (not administered)  sodium chloride 0.9 % bolus 1,000 mL (not administered)  enoxaparin (LOVENOX) injection 30 mg (not administered)  acetaminophen (TYLENOL) tablet 650 mg (not administered)    Or  acetaminophen (TYLENOL) suppository 650 mg (not administered)  amLODipine (NORVASC) tablet 5 mg (not administered)  clonazePAM (KLONOPIN) tablet 0.5 mg (not administered)  rosuvastatin (CRESTOR) tablet 40 mg (not administered)  DULoxetine (CYMBALTA) DR capsule 60 mg (not administered)  levothyroxine  (SYNTHROID, LEVOTHROID) tablet 150 mcg (not administered)  ceFEPIme (MAXIPIME) 2 g in dextrose 5 % 50 mL IVPB (2 g Intravenous New Bag/Given 07/23/16 1439)     Initial Impression / Assessment and Plan / ED Course  I have reviewed the triage vital signs and the nursing notes.  Pertinent labs & imaging results that were available during my  care of the patient were reviewed by me and considered in my medical decision making (see chart for details).  Clinical Course     Kaetlin Bullen is a 56 y.o. female with PMHx of DM who presents to the Emergency Department complaining of worsening, moderate burning, "firey" left foot pain that began yesterday.   History and exam are seen above. On exam, patient has a blister and erythema on the dorsal aspect of her left foot. There is streaking of erythema up proximally past her ankle. Patient has pain in the entire left leg and warmth in the leg. Patient has decreased sensation on the foot which she reports is unchanged. Patient has a crack in the skin on the plantar aspect of the left great toe. Patient's right foot has an amputation. Exam is otherwise unremarkable.  Given the patient's history of diabetes as well as diabetic infection including osteomyelitis leading to partial limitation of the right foot, there is clinical concern for serious infection of the left foot. As the patient says there was no skin change last night, this is a very quickly evolving infection. A skin marker was used to draw the line of erythema on initial evaluation.  Patient will have laboratory testing to look for abnormalities as well as x-ray to look for subcutaneous air or signs of ostial myelitis.  Laboratory testing returned showing a leukocytosis of 23. Lactic acid was nonelevated however, there is still clinical concern for serious infection. Vision also found to have acute kidney injury.fluid bolus ordered to help her kidneys. Patient started on antibiotics after cultures were  obtained. Pharmacy was called to discuss proper antibiotic choice given intolerances and allergies of the patient. They recommended cefepime, Flagyl, and doxycycline for coverage at this time. They felt infectious disease may need to be called after patient is admitted.  Patient will be admitted to internal medicine service for further management of her diabetic foot infection.      I personally performed the services described in this documentation, which was scribed in my presence. The recorded information has been reviewed and is accurate.     Final Clinical Impressions(s) / ED Diagnoses   Final diagnoses:  Diabetic foot infection ([place])     Clinical Impression: 1. Diabetic foot infection ([place])     Disposition: Admit to internal medicine    [name], MD 07/23/16 2255

## 2016-07-24 DIAGNOSIS — L03116 Cellulitis of left lower limb: Secondary | ICD-10-CM | POA: Diagnosis not present

## 2016-07-24 LAB — BASIC METABOLIC PANEL
Anion gap: 10 (ref 5–15)
BUN: 39 mg/dL — ABNORMAL HIGH (ref 6–20)
CO2: 22 mmol/L (ref 22–32)
Calcium: 8.5 mg/dL — ABNORMAL LOW (ref 8.9–10.3)
Chloride: 105 mmol/L (ref 101–111)
Creatinine, Ser: 2.07 mg/dL — ABNORMAL HIGH (ref 0.44–1.00)
GFR calc Af Amer: 30 mL/min — ABNORMAL LOW (ref >60)
GFR calc non Af Amer: 26 mL/min — ABNORMAL LOW (ref >60)
Glucose, Bld: 211 mg/dL — ABNORMAL HIGH (ref 65–99)
Potassium: 4.1 mmol/L (ref 3.5–5.1)
Sodium: 137 mmol/L (ref 135–145)

## 2016-07-24 LAB — CBC
HCT: 32.8 % — ABNORMAL LOW (ref 36.0–46.0)
Hemoglobin: 11.3 g/dL — ABNORMAL LOW (ref 12.0–15.0)
MCH: 30.9 pg (ref 26.0–34.0)
MCHC: 34.5 g/dL (ref 30.0–36.0)
MCV: 89.6 fL (ref 78.0–100.0)
Platelets: 367 10*3/uL (ref 150–400)
RBC: 3.66 MIL/uL — ABNORMAL LOW (ref 3.87–5.11)
RDW: 12.1 % (ref 11.5–15.5)
WBC: 20.1 10*3/uL — ABNORMAL HIGH (ref 4.0–10.5)

## 2016-07-24 LAB — GLUCOSE, CAPILLARY
Glucose-Capillary: 218 mg/dL — ABNORMAL HIGH (ref 65–99)
Glucose-Capillary: 132 mg/dL — ABNORMAL HIGH (ref 65–99)
Glucose-Capillary: 191 mg/dL — ABNORMAL HIGH (ref 65–99)
Glucose-Capillary: 251 mg/dL — ABNORMAL HIGH (ref 65–99)

## 2016-07-24 MED ORDER — SODIUM CHLORIDE 0.9 % IV SOLN
INTRAVENOUS | Status: AC
  Administered 2016-07-24: 14:00:00 via INTRAVENOUS

## 2016-07-24 MED ORDER — AMOXICILLIN-POT CLAVULANATE 500-125 MG PO TABS
1.0000 | ORAL_TABLET | Freq: Two times a day (BID) | ORAL | Status: DC
  Administered 2016-07-24 – 2016-07-25 (×3): 500 mg via ORAL
  Filled 2016-07-24 (×4): qty 1

## 2016-07-24 NOTE — Progress Notes (Signed)
Subjective: Patient was feeling well and initially sleeping when team entered the room.  She has not had any pain in her leg today.   Objective: Vital signs in last 24 hours: Vitals:   07/23/16 1500 07/23/16 1741 07/23/16 2300 07/24/16 0615  BP: 129/71 (!) 141/66 (!) 154/63 (!) 148/79  Pulse: 90  (!) 110 (!) 101  Resp:  _0 Temp:  99.1 F (37.3 C) 98.4 F (36.9 C) 98.2 F (36.8 C)  TempSrc:   Oral Axillary  SpO2: 97% 99% 99% 99%  Weight:      Height:       Weight change:  No intake or output data in the 24 hours ending 07/24/16 1305  Physical Exam Gen: Well-appearing in NAD HEENT: NCAT. EOMI. Moist mucous membranes.  CV: RRR with no murmurs, rubs, or gallops. Pulm:  CTAB, no wheezes or rhonchi. Abdomen: Soft, non-tender to palpation in four quadrants.  Normal BS appreciated.  Extremities: Erythema only over L great toe.  Blister over left leg slightly increased in size today to ~3cm. Sensation of L foot grossly intact.   Lab Results: CBC Latest Ref Rng & Units 07/24/2016 07/23/2016 07/13/2015  WBC 4.0 - 10.5 K/uL 20.1(H) 23.7(H) 13.4(H)  Hemoglobin 12.0 - 15.0 g/dL 11.3(L) 11.7(L) 10.7(L)  Hematocrit 36.0 - 46.0 % 32.8(L) 33.9(L) 30.9(L)  Platelets 150 - 400 K/uL 367 361 356   BMP Latest Ref Rng & Units 07/24/2016 07/23/2016 07/13/2015  Glucose 65 - 99 mg/dL 211(H) 231(H) 239(H)  BUN 6 - 20 mg/dL 39(H) 38(H) 16  Creatinine 0.44 - 1.00 mg/dL 2.07(H) 1.98(H) 0.85  Sodium 135 - 145 mmol/L 137 132(L) 140  Potassium 3.5 - 5.1 mmol/L 4.1 4.2 5.2(H)  Chloride 101 - 111 mmol/L 105 99(L) 107  CO2 22 - 32 mmol/L 22 21(L) 23  Calcium 8.9 - 10.3 mg/dL 8.5(L) 8.7(L) 8.5(L)   ESR - 128 CRP - 16.1  Micro Results: No results found for this or any previous visit (from the past 240 hour(s)). Studies/Results: Mr Foot Left Wo Contrast  Result Date: 07/24/2016 CLINICAL DATA:  Left foot cellulitis.  Blister on the great toe. EXAM: MRI OF THE LEFT FOOT WITHOUT CONTRAST TECHNIQUE:  Multiplanar, multisequence MR imaging of the left foot was performed. No intravenous contrast was administered. COMPARISON:  Radiographs dated 07/23/2016 FINDINGS: Bones/Joint/Cartilage Normal bones.  No significant joint effusions. Ligaments Normal. Muscles and Tendons Normal. Soft tissues 3 cm blister on the dorsal aspect of the great toe. Slight edema in the subcutaneous fat of the lateral aspect of the dorsum of the foot. No abscess. IMPRESSION: 1. Blister on the dorsal aspect of the great toe. Nonspecific edema in the subcutaneous fat of the lateral aspect of the dorsum of the foot. 2. No evidence of osteomyelitis, septic joint, or soft tissue abscess. Electronically Signed   By: Lorriane Shire M.D.   On: 07/24/2016 08:36   Dg Foot Complete Left  Result Date: 07/23/2016 CLINICAL DATA:  56 year old female with left foot infections; large blister on the left great toe with redness in the distal metatarsals 1 through 3. No known injury. Initial encounter. EXAM: LEFT FOOT - COMPLETE 3+ VIEW COMPARISON:  Left foot series 15176. FINDINGS: Large area soft tissue swelling evident along the lateral aspect of the left first toe. No subcutaneous gas. No cortical osteolysis in the left first ray. Degenerative changes at the first MTP joint with medial partially calcified tophus. The first IP joint space is normal. Healed fractures of the  second through fifth proximal phalanges. The metatarsals appear intact. Tarsal bones appear intact, degenerative spurring at the calcaneus. Tarsal joint spaces are preserved. No acute fracture or dislocation. No cortical osteolysis. IMPRESSION: Soft tissue abnormality maximal along the lateral aspect of the first toe. No associated acute osseous abnormality. No subcutaneous gas. Electronically Signed   By: Genevie Ann M.D.   On: 07/23/2016 13:25   Medications: I have reviewed the patient's current medications. Scheduled Meds: . amLODipine  5 mg Oral QODAY  . amoxicillin-clavulanate  1  tablet Oral BID  . clonazePAM  0.5 mg Oral QHS  . DULoxetine  60 mg Oral Daily  . enoxaparin (LOVENOX) injection  30 mg Subcutaneous Q24H  . insulin aspart  0-15 Units Subcutaneous TID WC  . insulin glargine  20 Units Subcutaneous BID  . levothyroxine  150 mcg Oral QAC breakfast  . rosuvastatin  40 mg Oral q1800   Continuous Infusions: PRN Meds:.acetaminophen **OR** acetaminophen Assessment/Plan: Principal Problem:   Cellulitis of left foot Active Problems:   Diabetes type 1, uncontrolled (HCC)  Left foot Cellulitis: Started 1/10 overnight per patient with erythema and pain running up leg and ~ 1 month history of opening in skin with history of recent osteomyelitis in her R foot. ESR/CRP elevated, but MRI not consistent with osteomyelitis.  - PO augmentin 500 BID for cellulitis - will consider d/c tomorrow if she does well on new PO abx regimen - warm compresses for blister and wound care  T1DM: Home regimen includes 40 lantus BID and aspart 20u before meals.  - lantus 25u BID - Novolog TID before meals   AKI in the context of chronic kidney disease: Serum Cr at 1.98 on admission.  Likely pre-renal given her nausea and limited PO intake before admission. - restart IVF at 100 cc/hr  Hypertension:  - continue home amlodopine 5 mg  Anxiety/Insomnia - Continue home clonazepam qhs  Depression - Continue home cymbalta qday  HLD - continue home crestor  Hypothyroidism -continue home levothyroxine  This is a Careers information officer Note.  The care of the patient was discussed with Dr. Wynetta Emery and the assessment and plan formulated with their assistance.  Please see their attached note for official documentation of the daily encounter.   LOS: 0 days   Doug Sou, Medical Student 07/24/2016, 1:05 PM

## 2016-07-24 NOTE — Progress Notes (Signed)
Subjective: Feeling good today, minimal pain persists. Concerned about blister on her toe - will have wound care evaluate and try warm compressed. Overall no complaints and seems improved. Agreeable to monitoring on po antibiotics today and likely DC tomorrow  Objective: Vital signs in last 24 hours: Vitals:   07/23/16 1500 07/23/16 1741 07/23/16 2300 07/24/16 0615  BP: 129/71 (!) 141/66 (!) 154/63 (!) 148/79  Pulse: 90  (!) 110 (!) 101  Resp:  18 17 15   Temp:  99.1 F (37.3 C) 98.4 F (36.9 C) 98.2 F (36.8 C)  TempSrc:   Oral Axillary  SpO2: 97% 99% 99% 99%  Weight:      Height:       No intake or output data in the 24 hours ending 07/24/16 1352  Physical Exam General appearance: Obese woman resting comfortably in bed, in no distress, pleasant and conversational  HENT: Normocephalic, atraumatic, moist mucous membranes, oropharynx clear Eyes:Pupils equal bilaterally, EOM intact, non-icteric Cardiovascular: Regular rate and rhythm, no murmurs, rubs, gallops Respiratory/Chest: Clear to auscultation bilaterally, normal work of breathing Abdomen: Obese, BS+, soft, non-tender, non-distended, no striae Extremities: Thin extremities, right foot stump with dry cracked callus across the forefoot, left foot with 3-4 cm diameter dark blister over great toe with improved surrounding erythema, dry callused crack under great toe without drainage, no edema, 2+ DP, PT, radial pulses. Cap refill < 2 sec Skin: Warm, dry, intact - foot changed per above Neuro: Alert and oriented, strength and sensation grossly intact Psych: Appropriate affect, clear speech, thoughts linear and goal-directed  Labs / Imaging / Procedures: CBC Latest Ref Rng & Units 07/24/2016 07/23/2016 07/13/2015  WBC 4.0 - 10.5 K/uL 20.1(H) 23.7(H) 13.4(H)  Hemoglobin 12.0 - 15.0 g/dL 11.3(L) 11.7(L) 10.7(L)  Hematocrit 36.0 - 46.0 % 32.8(L) 33.9(L) 30.9(L)  Platelets 150 - 400 K/uL 367 361 356   BMP Latest Ref Rng & Units  07/24/2016 07/23/2016 07/13/2015  Glucose 65 - 99 mg/dL 211(H) 231(H) 239(H)  BUN 6 - 20 mg/dL 39(H) 38(H) 16  Creatinine 0.44 - 1.00 mg/dL 2.07(H) 1.98(H) 0.85  Sodium 135 - 145 mmol/L 137 132(L) 140  Potassium 3.5 - 5.1 mmol/L 4.1 4.2 5.2(H)  Chloride 101 - 111 mmol/L 105 99(L) 107  CO2 22 - 32 mmol/L 22 21(L) 23  Calcium 8.9 - 10.3 mg/dL 8.5(L) 8.7(L) 8.5(L)   Mr Foot Left Wo Contrast  Result Date: 07/24/2016 CLINICAL DATA:  Left foot cellulitis.  Blister on the great toe. EXAM: MRI OF THE LEFT FOOT WITHOUT CONTRAST TECHNIQUE: Multiplanar, multisequence MR imaging of the left foot was performed. No intravenous contrast was administered. COMPARISON:  Radiographs dated 07/23/2016 FINDINGS: Bones/Joint/Cartilage Normal bones.  No significant joint effusions. Ligaments Normal. Muscles and Tendons Normal. Soft tissues 3 cm blister on the dorsal aspect of the great toe. Slight edema in the subcutaneous fat of the lateral aspect of the dorsum of the foot. No abscess. IMPRESSION: 1. Blister on the dorsal aspect of the great toe. Nonspecific edema in the subcutaneous fat of the lateral aspect of the dorsum of the foot. 2. No evidence of osteomyelitis, septic joint, or soft tissue abscess. Electronically Signed   By: Lorriane Shire M.D.   On: 07/24/2016 08:36    Assessment/Plan: Cellulitis of the left foot, 1 day of acute spreading erythema, pain, warmth, blister from left great toe adjacent to known area of skin breakdown with chills, nausea, poorly controlled T1DM, history of osteomyelitis of right toes/foot requiring transmet amputation 1 year  ago via Dr. Sharol Given. Leukocytosis to 23.7, afebrile, HDS, good pulses, cap refill and sensation. XR with evidence of soft tissue involvement but no bone involvement. Vancomycin allergy. Started on empiric po Doxycycline, Flagyl, and IV Cefepime on 1/11. MRI left foot consistent with blister over. Nonspecific edema in the subcutaneous fat of the lateral aspect of the  dorsum of the foot - no osteo, no obvious cellulitis but non contrast study.  -- Start Augmentin BID -- Wound care consult for blister -- Warm compresses TID -- DC Cefepime, Doxy, Flagyl -- Optimize glycemic control -- Follow blood cultures -- Trend CBC -- Tylenol PRN for aches, fever -- IV fluids 100 cc/hr  T1DM, poorly-controlled, since 1970s, last A1c 9.4 in 06/09/2016 on Lantus 40 BID, and Novolog 20 TID WC at home. Renal, retinal, peripheral nerve involvement. -- Lantus 20 U BID -- SSI moderate TID WC, CBGs TID WC HS  AKI on CKD, creatinine approx 2.0 from baseline 1.2-1.5 per care everywhere, previous baseline 0.8-0.9 in 2016, possible background progressive CKD.  - s/p 1L NS bolus in ED - NS 100 cc/hr for 10 hours again - Trend BMP  Anxiety/insomnia - continue home Klonopin QHS Depression - continue home Cymbalta QD Hypothyroidism - continue home Levothyroxine HLD - continue home Crestor HTN - normotensive, continue home Amlodipine, hold home Lisinopril-HCTZ in setting of AKI  FEN/GI: CM diet, replete electrolytes as needed  DVT ppx: Lovenox  Dispo: Anticipated discharge in approximately 1-2 day(s).   LOS: 0 days   Asencion Partridge, MD 07/24/2016, 1:52 PM Pager: [phone]

## 2016-07-24 NOTE — Consult Note (Addendum)
[facility] Nurse wound consult note Reason for Consult: Consult requested for left foot wound.  Pt states she is followed by Dr Sharol Given as an outpatient for a chronic full thickness wound, which is a fissure located in the middle of a callous to left plantar foot.  He assessed the wound last week and ordered antibiotic ointment.  The patient states she developed increased swelling, erythremia, and a blister to left anterior toe within the past several days.  MRI did not indicate an abscess or osteomyelitis. Pt is already on systemic antibiotic coverage for cellulitis. Wound type: Full thickness fissure to left plantar great toe; approx .8X.2X.2cm, pink and moist, small amt yellow drainage, no odor or fluctuance. Dressing procedure/placement/frequency: Left anterior great toe with bulla, apptox 3X3cm, skin pulled very tight and dark purple surrounding the clear fluid-filled blister.  Small puncture hole made to drain the blister and reduce pressure.  Drained large amt clear yellow fluid, no odor.  Skin left approximated over wound to promote healing.   Plan: Xeroform to promote drying and healing of wounds.  Pt can resume follow-up with Dr Sharol Given after discharge.  Discussed plan of care with patient and she verbal;ized understanding. Please re-consult if further assistance is needed.  Thank-you,  Julien Girt MSN, [place], [place], [place], [place]

## 2016-07-24 NOTE — Progress Notes (Addendum)
Inpatient Diabetes Program Recommendations  AACE/ADA: New Consensus Statement on Inpatient Glycemic Control (2015)  Target Ranges:  Prepandial:   less than 140 mg/dL      Peak postprandial:   less than 180 mg/dL (1-2 hours)      Critically ill patients:  140 - 180 mg/dL   Results for Suzanne Allen, Suzanne Allen (MRN [id number]) as of 07/24/2016 09:49  Ref. Range 07/23/2016 23:01 07/24/2016 06:13  Glucose-Capillary Latest Ref Range: 65 - 99 mg/dL 242 (H) 218 (H)   Review of Glycemic Control  Diabetes history: DM 1 diagnosed age 24 Outpatient Diabetes medications: Lantus 40 BID, Novolog 20 units TID Current orders for Inpatient glycemic control: Lantus 20 BID, Novolog Moderate TID  Inpatient Diabetes Program Recommendations:   Glucose still in 200's after Lantus 20 units. Please consider increasing Lantus to 25 units BID, also consider ordering meal coverage, Novolog 3-5 units TID, since patient is DM type 1 and does not make her own insulin and also takes meal coverage at home.  Thanks,  Tama Headings RN, MSN, [facility] Inpatient Diabetes Coordinator Team Pager [phone] (8a-5p)

## 2016-07-25 DIAGNOSIS — L03116 Cellulitis of left lower limb: Secondary | ICD-10-CM

## 2016-07-25 LAB — GLUCOSE, CAPILLARY
Glucose-Capillary: 181 mg/dL — ABNORMAL HIGH (ref 65–99)
Glucose-Capillary: 173 mg/dL — ABNORMAL HIGH (ref 65–99)

## 2016-07-25 LAB — BASIC METABOLIC PANEL
Anion gap: 6 (ref 5–15)
BUN: 28 mg/dL — ABNORMAL HIGH (ref 6–20)
CO2: 21 mmol/L — ABNORMAL LOW (ref 22–32)
Calcium: 8 mg/dL — ABNORMAL LOW (ref 8.9–10.3)
Chloride: 111 mmol/L (ref 101–111)
Creatinine, Ser: 1.74 mg/dL — ABNORMAL HIGH (ref 0.44–1.00)
GFR calc Af Amer: 37 mL/min — ABNORMAL LOW (ref >60)
GFR calc non Af Amer: 32 mL/min — ABNORMAL LOW (ref >60)
Glucose, Bld: 127 mg/dL — ABNORMAL HIGH (ref 65–99)
Potassium: 3.9 mmol/L (ref 3.5–5.1)
Sodium: 138 mmol/L (ref 135–145)

## 2016-07-25 MED ORDER — AMOXICILLIN-POT CLAVULANATE 500-125 MG PO TABS: 1.0000 | ORAL_TABLET | Freq: Two times a day (BID) | ORAL | 0 refills | Status: DC

## 2016-07-25 NOTE — Discharge Instructions (Signed)
Please take the augmentin twice a day for 9 more days  Please follow up with your PCP and make an appointment.  It was a pleasure taking care of you.

## 2016-07-25 NOTE — Progress Notes (Signed)
   Subjective: Feeling good today, minimal pain persists.  Feels ready to go home today.  Objective: Vital signs in last 24 hours: Vitals:   07/24/16 0615 07/24/16 1439 07/24/16 2015 07/25/16 0527  BP: (!) 148/79 136/62 138/73 138/77  Pulse: (!) 101 93 95 86  Resp: 15 16 16 16   Temp: 98.2 F (36.8 C) 98.9 F (37.2 C) 98.9 F (37.2 C) 98.5 F (36.9 C)  TempSrc: Axillary  Oral Oral  SpO2: 99% 99% 96% 98%  Weight:      Height:        Intake/Output Summary (Last 24 hours) at 07/25/16 1216 Last data filed at 07/25/16 0900  Gross per 24 hour  Intake              720 ml  Output                0 ml  Net              720 ml    Physical Exam General appearance: Obese woman resting comfortably in bed, in no distress, pleasant and conversational  Cardiovascular: Regular rate and rhythm, no murmurs, rubs, gallops Respiratory/Chest: Clear to auscultation bilaterally, normal work of breathing Abdomen: Obese, BS+, soft, non-tender, non-distended, no striae Extremities: Thin extremities, right foot stump is bandaged. No drainage appreciated.   Labs / Imaging / Procedures: CBC Latest Ref Rng & Units 07/24/2016 07/23/2016 07/13/2015  WBC 4.0 - 10.5 K/uL 20.1(H) 23.7(H) 13.4(H)  Hemoglobin 12.0 - 15.0 g/dL 11.3(L) 11.7(L) 10.7(L)  Hematocrit 36.0 - 46.0 % 32.8(L) 33.9(L) 30.9(L)  Platelets 150 - 400 K/uL 367 361 356   BMP Latest Ref Rng & Units 07/25/2016 07/24/2016 07/23/2016  Glucose 65 - 99 mg/dL 127(H) 211(H) 231(H)  BUN 6 - 20 mg/dL 28(H) 39(H) 38(H)  Creatinine 0.44 - 1.00 mg/dL 1.74(H) 2.07(H) 1.98(H)  Sodium 135 - 145 mmol/L 138 137 132(L)  Potassium 3.5 - 5.1 mmol/L 3.9 4.1 4.2  Chloride 101 - 111 mmol/L 111 105 99(L)  CO2 22 - 32 mmol/L 21(L) 22 21(L)  Calcium 8.9 - 10.3 mg/dL 8.0(L) 8.5(L) 8.7(L)   No results found.  Assessment/Plan: Cellulitis of the left foot, : was switched to augmentin Po yesterday which she tolerated it well. Blood cultures NGTD from 1/11. MRI only  showed a blister-  No osteo.  --  Augmentin BID -- Warm compresses TID -- Follow blood cultures -- Tylenol PRN for aches, fever  T1DM, poorly-controlled, since 1970s, last A1c 9.4 in 06/09/2016 on Lantus 40 BID, and Novolog 20 TID WC at home. Renal, retinal, peripheral nerve involvement. -- Lantus 20 U BID -- SSI moderate TID WC, CBGs TID WC HS  AKI on CKD, - improving    FEN/GI: CM diet, replete electrolytes as needed  DVT ppx: Lovenox  Dispo: Anticipated discharge in approximately 0   Burgess Estelle, MD 07/25/2016, 12:16 PM Pager: [phone]

## 2016-07-25 NOTE — Plan of Care (Signed)
Problem: Pain Managment: Goal: General experience of comfort will improve Outcome: Progressing Denies pain and discomfort  Problem: Physical Regulation: Goal: Will remain free from infection Outcome: Progressing VS WNL  Problem: Skin Integrity: Goal: Risk for impaired skin integrity will decrease Outcome: Not Progressing Left great toe is red, opened blisters noted with moderate amount of serosanguinous drainage, new dry sterile gauzes applied and covered with Kerlix  Problem: Tissue Perfusion: Goal: Risk factors for ineffective tissue perfusion will decrease Outcome: Progressing No S/S of DVT noted. Refused SCDs, was educated with verbalization of understanding, she received Lovenox SQ  Problem: Activity: Goal: Risk for activity intolerance will decrease Outcome: Progressing Tolerates activities well  Problem: Bowel/Gastric: Goal: Will not experience complications related to bowel motility Outcome: Progressing Denies gastric and bowel issues

## 2016-07-27 NOTE — Discharge Summary (Signed)
Name: Suzanne Allen MRN: [id number] DOB: [birth year] 56 y.o. PCP: Barrie Lyme, FNP  Date of Admission: 07/23/2016 11:53 AM Date of Discharge: 07/25/2016 Attending Physician: Sid Falcon, MD  Discharge Diagnosis: 1. Cellulitis 2. AKI 3. Type 1 diabetes, uncontrolled  Principal Problem:   Cellulitis of left foot Active Problems:   Diabetes type 1, uncontrolled ([place])   AKI (acute kidney injury) [facility])   Discharge Medications: Allergies as of 07/25/2016      Reactions   Erythromycin Anaphylaxis   Vancomycin Shortness Of Breath, Other (See Comments)   Red Man Syndrome      Medication List    TAKE these medications   amLODipine 5 MG tablet Commonly known as:  NORVASC Take 1 tablet by mouth every other day.   amoxicillin-clavulanate 500-125 MG tablet Commonly known as:  AUGMENTIN Take 1 tablet (500 mg total) by mouth 2 (two) times daily. Until January 21st   clonazePAM 0.5 MG tablet Commonly known as:  KLONOPIN Take 1 tablet (0.5 mg total) by mouth at bedtime.   CRESTOR 40 MG tablet Generic drug:  rosuvastatin Take 1 tablet by mouth daily   DULoxetine 60 MG capsule Commonly known as:  CYMBALTA Take 1 capsule (60 mg total) by mouth daily.   insulin aspart 100 UNIT/ML injection Commonly known as:  novoLOG Inject 20 Units into the skin 3 (three) times daily before meals.   insulin glargine 100 UNIT/ML injection Commonly known as:  LANTUS Inject 40 Units into the skin 2 (two) times daily.   levothyroxine 150 MCG tablet Commonly known as:  SYNTHROID, LEVOTHROID Take 150 mcg by mouth daily before breakfast.   lisinopril-hydrochlorothiazide 20-25 MG tablet Commonly known as:  PRINZIDE,ZESTORETIC Take 1 tablet by mouth every morning.       Disposition and follow-up:   SuzanneSuzanne Allen was discharged from [facility] in Stable condition.  At the hospital follow up visit please address:  1.  Cellulitis of left foot - assess her foot for  erythema, pain, swelling and any infected looking ulcers  AKI - assess renal function  T1DM - assess glycemic control  2.  Labs / imaging needed at time of follow-up: BMP, HbA1c  3.  Pending labs/ test needing follow-up: None  Follow-up Appointments: Follow-up Information    Barrie Lyme, FNP. Schedule an appointment as soon as possible for a visit in 1 week(s).   Specialty:  Nurse Practitioner Contact information: [address] [place] [address] [phone]          Hospital Course by problem list: Principal Problem:   Cellulitis of left foot Active Problems:   Diabetes type 1, uncontrolled ([place])   AKI (acute kidney injury) ([place])   1. Cellulitis of the left foot Suzanne Allen is a 56yo woman with T1DM, retinopathy, CKD, osteomyelitis on the right foot s/p amputation 06/2015, HTN, HLD, chronic pancreatitis who presented on 1/11 with a 1 day history of erythema, blistering, and shooting pains on her left foot concerning for osteomyelitis. Afebrile, hemodynamically stable but with leukocytosis to 24k. She was admitted and started on broad spectrum antibiotics. XR showed no evidence of bone involvement. Non-contrast MRI of the left foot was consistent with blister over the great toe with nonspecific soft tissue edema. She was transitioned to po Augmentin on 1/12. Wound care was consulted, drained her blister, bandaged her toe. Her toe erythema and pain continued to improve and she was stable for discharge home on 1/13 with 10  day course of Augmentin and PCP follow up.  2. AKI - presented with creatinine approx 2.0 up from baseline 1.2-1.5, likely prerenal, resolved to 1.74 within two days with IV fluids and good po intake.   3. Type 1 diabetes mellitus - poor control, last A1c 9.4 in 05/2016, maintained adequate glycemic control on half home dose long-acting insulin and sliding scale during hospital stay   Discharge Vitals:   BP 138/77 (BP Location: Right Arm)    Pulse 86   Temp 98.5 F (36.9 C) (Oral)   Resp 16   Ht 5\' 2"  (1.575 m)   Wt 155 lb (70.3 kg)   SpO2 98%   BMI 28.35 kg/m   Pertinent Labs, Studies, and Procedures:  CBC Latest Ref Rng & Units 07/24/2016 07/23/2016 07/13/2015  WBC 4.0 - 10.5 K/uL 20.1(H) 23.7(H) 13.4(H)  Hemoglobin 12.0 - 15.0 g/dL 11.3(L) 11.7(L) 10.7(L)  Hematocrit 36.0 - 46.0 % 32.8(L) 33.9(L) 30.9(L)  Platelets 150 - 400 K/uL 367 361 356   BMP Latest Ref Rng & Units 07/25/2016 07/24/2016 07/23/2016  Glucose 65 - 99 mg/dL 127(H) 211(H) 231(H)  BUN 6 - 20 mg/dL 28(H) 39(H) 38(H)  Creatinine 0.44 - 1.00 mg/dL 1.74(H) 2.07(H) 1.98(H)  Sodium 135 - 145 mmol/L 138 137 132(L)  Potassium 3.5 - 5.1 mmol/L 3.9 4.1 4.2  Chloride 101 - 111 mmol/L 111 105 99(L)  CO2 22 - 32 mmol/L 21(L) 22 21(L)  Calcium 8.9 - 10.3 mg/dL 8.0(L) 8.5(L) 8.7(L)   Mr Foot Left Wo Contrast  Result Date: 07/24/2016 CLINICAL DATA:  Left foot cellulitis.  Blister on the great toe. EXAM: MRI OF THE LEFT FOOT WITHOUT CONTRAST TECHNIQUE: Multiplanar, multisequence MR imaging of the left foot was performed. No intravenous contrast was administered. COMPARISON:  Radiographs dated 07/23/2016 FINDINGS: Bones/Joint/Cartilage Normal bones.  No significant joint effusions. Ligaments Normal. Muscles and Tendons Normal. Soft tissues 3 cm blister on the dorsal aspect of the great toe. Slight edema in the subcutaneous fat of the lateral aspect of the dorsum of the foot. No abscess. IMPRESSION: 1. Blister on the dorsal aspect of the great toe. Nonspecific edema in the subcutaneous fat of the lateral aspect of the dorsum of the foot. 2. No evidence of osteomyelitis, septic joint, or soft tissue abscess. Electronically Signed   By: Lorriane Shire M.D.   On: 07/24/2016 08:36   Dg Foot Complete Left  Result Date: 07/23/2016 CLINICAL DATA:  56 year old female with left foot infections; large blister on the left great toe with redness in the distal metatarsals 1 through 3.  No known injury. Initial encounter. EXAM: LEFT FOOT - COMPLETE 3+ VIEW COMPARISON:  Left foot series [id number]. FINDINGS: Large area soft tissue swelling evident along the lateral aspect of the left first toe. No subcutaneous gas. No cortical osteolysis in the left first ray. Degenerative changes at the first MTP joint with medial partially calcified tophus. The first IP joint space is normal. Healed fractures of the second through fifth proximal phalanges. The metatarsals appear intact. Tarsal bones appear intact, degenerative spurring at the calcaneus. Tarsal joint spaces are preserved. No acute fracture or dislocation. No cortical osteolysis. IMPRESSION: Soft tissue abnormality maximal along the lateral aspect of the first toe. No associated acute osseous abnormality. No subcutaneous gas. Electronically Signed   By: Genevie Ann M.D.   On: 07/23/2016 13:25   Discharge Instructions: Discharge Instructions    Diet - low sodium heart healthy    Complete by:  As directed  Increase activity slowly    Complete by:  As directed       Signed: Asencion Partridge, MD 07/29/2016, 7:29 PM   Pager: [phone]

## 2016-07-28 LAB — CULTURE, BLOOD (ROUTINE X 2)
Culture: NO GROWTH
Culture: NO GROWTH

## 2016-07-29 DIAGNOSIS — N179 Acute kidney failure, unspecified: Secondary | ICD-10-CM

## 2016-08-04 ENCOUNTER — Ambulatory Visit (INDEPENDENT_AMBULATORY_CARE_PROVIDER_SITE_OTHER): Payer: Medicaid Other | Admitting: Psychiatry

## 2016-08-04 ENCOUNTER — Encounter (HOSPITAL_COMMUNITY): Payer: Self-pay | Admitting: Psychiatry

## 2016-08-04 DIAGNOSIS — F330 Major depressive disorder, recurrent, mild: Secondary | ICD-10-CM

## 2016-08-04 DIAGNOSIS — Z794 Long term (current) use of insulin: Secondary | ICD-10-CM

## 2016-08-04 DIAGNOSIS — Z888 Allergy status to other drugs, medicaments and biological substances status: Secondary | ICD-10-CM

## 2016-08-04 DIAGNOSIS — Z818 Family history of other mental and behavioral disorders: Secondary | ICD-10-CM

## 2016-08-04 DIAGNOSIS — Z811 Family history of alcohol abuse and dependence: Secondary | ICD-10-CM

## 2016-08-04 DIAGNOSIS — Z79899 Other long term (current) drug therapy: Secondary | ICD-10-CM

## 2016-08-04 DIAGNOSIS — Z9851 Tubal ligation status: Secondary | ICD-10-CM | POA: Diagnosis not present

## 2016-08-04 DIAGNOSIS — Z833 Family history of diabetes mellitus: Secondary | ICD-10-CM

## 2016-08-04 DIAGNOSIS — Z9889 Other specified postprocedural states: Secondary | ICD-10-CM

## 2016-08-04 MED ORDER — DULOXETINE HCL 60 MG PO CPEP: 60.0000 mg | ORAL_CAPSULE | Freq: Every day | ORAL | 2 refills | Status: DC

## 2016-08-04 MED ORDER — CLONAZEPAM 1 MG PO TABS: 1.0000 mg | ORAL_TABLET | Freq: Every day | ORAL | 2 refills | Status: DC
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
## 2016-09-14 ENCOUNTER — Ambulatory Visit (INDEPENDENT_AMBULATORY_CARE_PROVIDER_SITE_OTHER): Payer: Medicaid Other | Admitting: Orthopedic Surgery

## 2016-11-03 ENCOUNTER — Encounter (HOSPITAL_COMMUNITY): Payer: Self-pay | Admitting: Psychiatry

## 2016-11-03 ENCOUNTER — Ambulatory Visit (INDEPENDENT_AMBULATORY_CARE_PROVIDER_SITE_OTHER): Payer: Medicaid Other | Admitting: Psychiatry

## 2016-11-03 DIAGNOSIS — F330 Major depressive disorder, recurrent, mild: Secondary | ICD-10-CM

## 2016-11-03 DIAGNOSIS — F4310 Post-traumatic stress disorder, unspecified: Secondary | ICD-10-CM

## 2016-11-03 DIAGNOSIS — Z818 Family history of other mental and behavioral disorders: Secondary | ICD-10-CM | POA: Diagnosis not present

## 2016-11-03 DIAGNOSIS — Z811 Family history of alcohol abuse and dependence: Secondary | ICD-10-CM | POA: Diagnosis not present

## 2016-11-03 DIAGNOSIS — Z79899 Other long term (current) drug therapy: Secondary | ICD-10-CM

## 2016-11-03 DIAGNOSIS — Z794 Long term (current) use of insulin: Secondary | ICD-10-CM

## 2016-11-03 IMAGING — MR MR HEAD W/O CM
10 of 12 series · 36 of 48 positions shown · non-contrast
Comparison: none

[Series 2: T1 · sagittal · 5.0mm · 0.45mm/px · 2 of 19 slices shown]
[im 1/19]
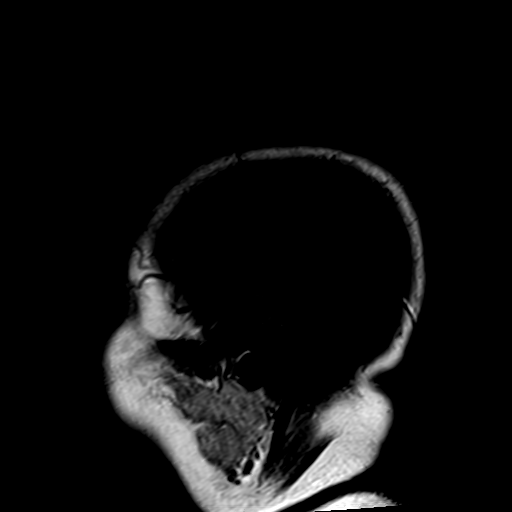
[im 19/19]
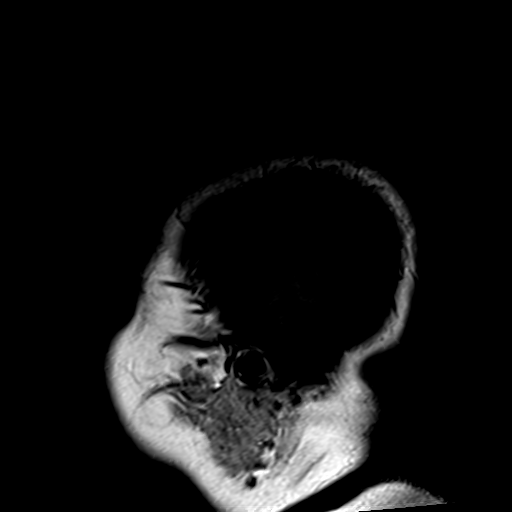

[Series 3: DWI · axial · 3.0mm · 1.80mm/px · z∈[-41,+89]mm · 7 of 84 slices shown (1 of 4)]
[im 1/84]
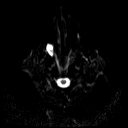
[im 14/84]
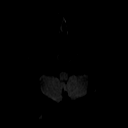
[im 28/84]
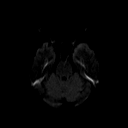
[im 42/84]
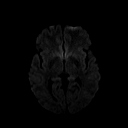
[im 56/84]
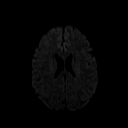
[im 70/84]
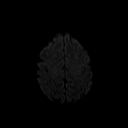
[im 84/84]
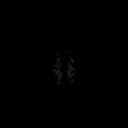

[Series 4: DWI · axial · 3.0mm · 1.80mm/px · z∈[-41,+89]mm · 4 of 42 slices shown (2 of 4)]
[im 1/42]
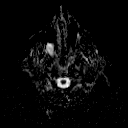
[im 14/42]
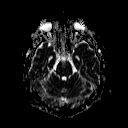
[im 28/42]
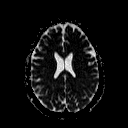
[im 42/42]
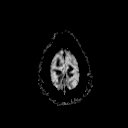

[Series 5: DWI · coronal · 5.0mm · 1.80mm/px · 6 of 64 slices shown (3 of 4)]
[im 1/64]
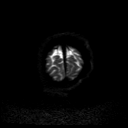
[im 13/64]
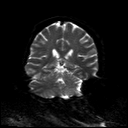
[im 26/64]
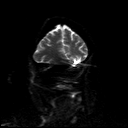
[im 38/64]
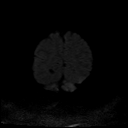
[im 51/64]
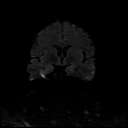
[im 64/64]
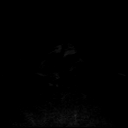

[Series 6: DWI · coronal · 5.0mm · 1.80mm/px · 3 of 32 slices shown (4 of 4)]
[im 1/32]
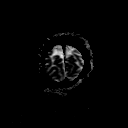
[im 16/32]
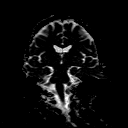
[im 32/32]
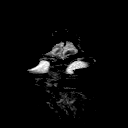

[Series 7: T2 · axial · 5.0mm · 0.30mm/px · z∈[-39,+92]mm · 2 of 22 slices shown (1 of 2)]
[im 1/22]
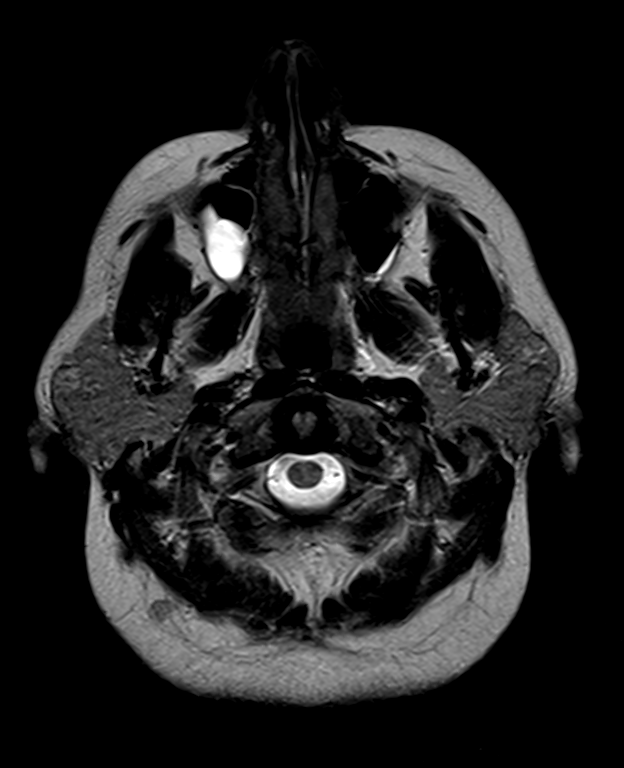
[im 22/22]
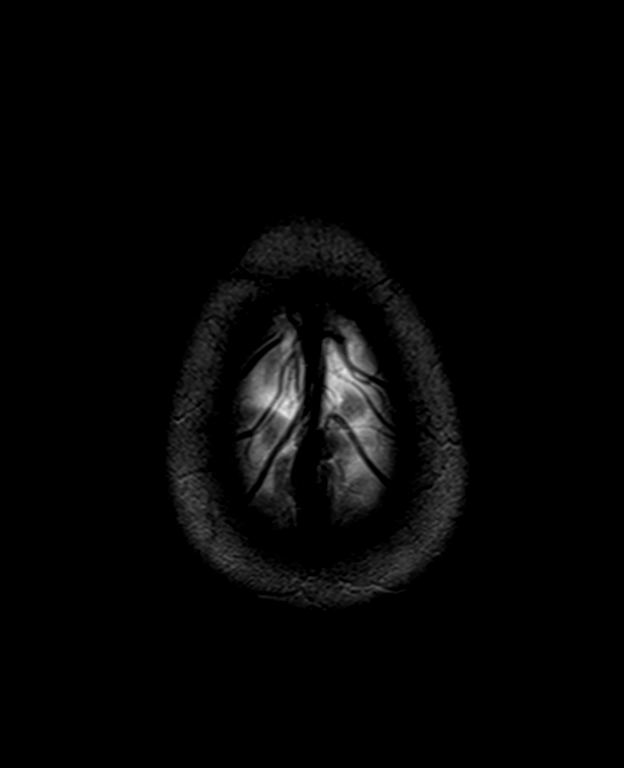

[Series 8: FLAIR · axial · 5.0mm · 0.45mm/px · z∈[-39,+92]mm · 2 of 22 slices shown]
[im 1/22]
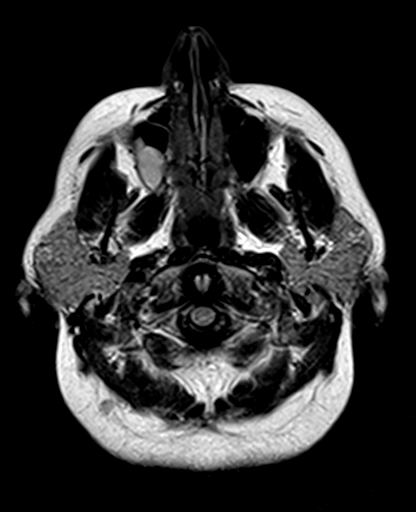
[im 22/22]
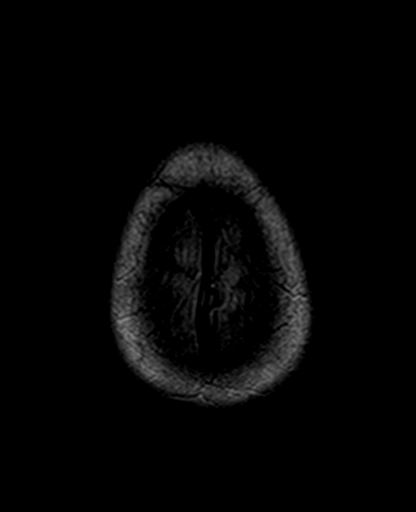

[Series 11: swi_images · axial · 2.0mm · 0.90mm/px · z∈[-42,+95]mm · 6 of 72 slices shown]
[im 1/72]
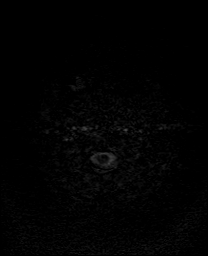
[im 15/72]
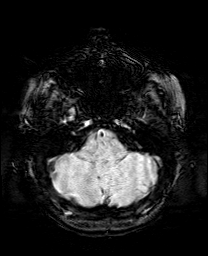
[im 29/72]
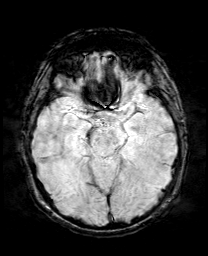
[im 43/72]
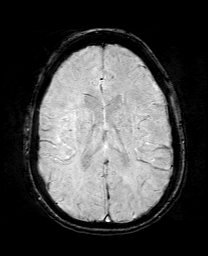
[im 57/72]
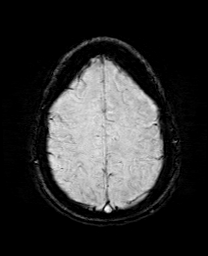
[im 72/72]
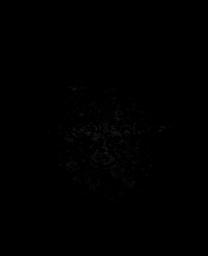

[Series 12: T2 · coronal · 5.0mm · 0.45mm/px · 2 of 22 slices shown (2 of 2)]
[im 1/22]
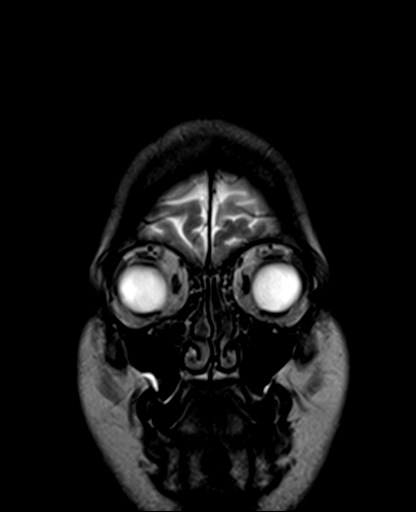
[im 22/22]
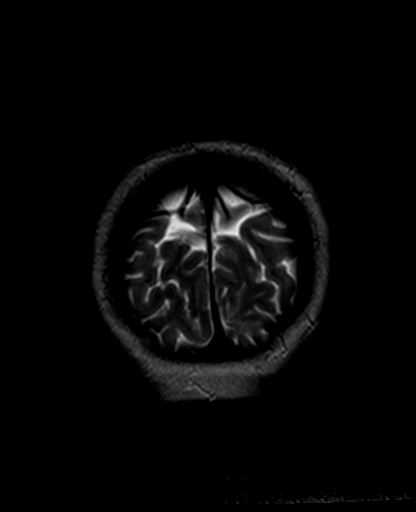

[Series 14: T1 post-contrast · coronal · 5.0mm · 0.43mm/px · 2 of 22 slices shown]
[im 1/22]
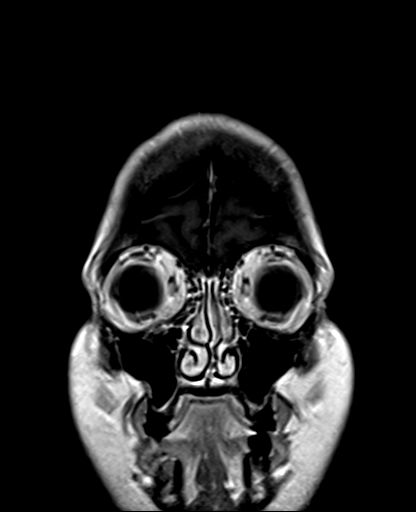
[im 22/22]
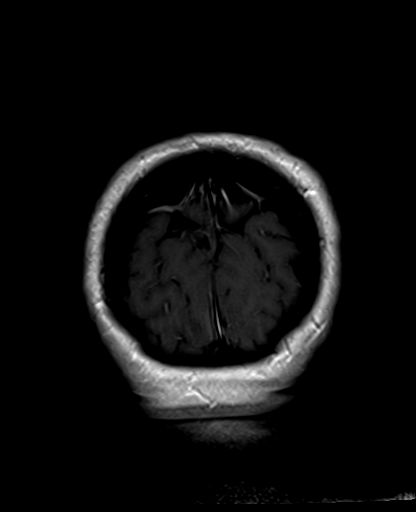

[36 of 48 positions shown; findings below may reference images not displayed]

Canned report from images found in remote index.

Refer to host system for actual result text.

## 2016-11-03 MED ORDER — CLONAZEPAM 1 MG PO TABS: 1.0000 mg | ORAL_TABLET | Freq: Every day | ORAL | 2 refills | Status: DC

## 2016-11-03 MED ORDER — DULOXETINE HCL 60 MG PO CPEP: 60.0000 mg | ORAL_CAPSULE | Freq: Every day | ORAL | 2 refills | Status: DC
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
## 2017-02-02 ENCOUNTER — Encounter (HOSPITAL_COMMUNITY): Payer: Self-pay | Admitting: Psychiatry

## 2017-02-02 ENCOUNTER — Ambulatory Visit (INDEPENDENT_AMBULATORY_CARE_PROVIDER_SITE_OTHER): Payer: Medicaid Other | Admitting: Psychiatry

## 2017-02-02 DIAGNOSIS — F4310 Post-traumatic stress disorder, unspecified: Secondary | ICD-10-CM | POA: Diagnosis not present

## 2017-02-02 DIAGNOSIS — Z811 Family history of alcohol abuse and dependence: Secondary | ICD-10-CM

## 2017-02-02 DIAGNOSIS — Z818 Family history of other mental and behavioral disorders: Secondary | ICD-10-CM

## 2017-02-02 DIAGNOSIS — F330 Major depressive disorder, recurrent, mild: Secondary | ICD-10-CM

## 2017-02-02 MED ORDER — CLONAZEPAM 1 MG PO TABS: 1.0000 mg | ORAL_TABLET | Freq: Every day | ORAL | 2 refills | Status: DC

## 2017-02-02 MED ORDER — DULOXETINE HCL 60 MG PO CPEP: 60.0000 mg | ORAL_CAPSULE | Freq: Every day | ORAL | 2 refills | Status: DC
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
## 2017-03-03 DIAGNOSIS — E1021 Type 1 diabetes mellitus with diabetic nephropathy: Secondary | ICD-10-CM | POA: Insufficient documentation

## 2017-05-05 ENCOUNTER — Encounter (HOSPITAL_COMMUNITY): Payer: Self-pay | Admitting: Psychiatry

## 2017-05-05 ENCOUNTER — Ambulatory Visit (INDEPENDENT_AMBULATORY_CARE_PROVIDER_SITE_OTHER): Payer: Medicaid Other | Admitting: Psychiatry

## 2017-05-05 DIAGNOSIS — F419 Anxiety disorder, unspecified: Secondary | ICD-10-CM

## 2017-05-05 DIAGNOSIS — Z634 Disappearance and death of family member: Secondary | ICD-10-CM | POA: Diagnosis not present

## 2017-05-05 DIAGNOSIS — G629 Polyneuropathy, unspecified: Secondary | ICD-10-CM

## 2017-05-05 DIAGNOSIS — Z62811 Personal history of psychological abuse in childhood: Secondary | ICD-10-CM

## 2017-05-05 DIAGNOSIS — R450 Nervousness: Secondary | ICD-10-CM

## 2017-05-05 DIAGNOSIS — F330 Major depressive disorder, recurrent, mild: Secondary | ICD-10-CM

## 2017-05-05 DIAGNOSIS — M2550 Pain in unspecified joint: Secondary | ICD-10-CM

## 2017-05-05 DIAGNOSIS — Z818 Family history of other mental and behavioral disorders: Secondary | ICD-10-CM | POA: Diagnosis not present

## 2017-05-05 DIAGNOSIS — G8929 Other chronic pain: Secondary | ICD-10-CM | POA: Diagnosis not present

## 2017-05-05 DIAGNOSIS — Z811 Family history of alcohol abuse and dependence: Secondary | ICD-10-CM | POA: Diagnosis not present

## 2017-05-05 DIAGNOSIS — E119 Type 2 diabetes mellitus without complications: Secondary | ICD-10-CM

## 2017-05-05 DIAGNOSIS — R413 Other amnesia: Secondary | ICD-10-CM

## 2017-05-05 MED ORDER — DULOXETINE HCL 60 MG PO CPEP: 60.0000 mg | ORAL_CAPSULE | Freq: Every day | ORAL | 2 refills | Status: DC

## 2017-05-05 MED ORDER — CLONAZEPAM 1 MG PO TABS: 1.0000 mg | ORAL_TABLET | Freq: Every day | ORAL | 2 refills | Status: DC
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
## 2017-05-19 ENCOUNTER — Encounter (INDEPENDENT_AMBULATORY_CARE_PROVIDER_SITE_OTHER): Payer: Self-pay | Admitting: Family

## 2017-05-19 ENCOUNTER — Ambulatory Visit (INDEPENDENT_AMBULATORY_CARE_PROVIDER_SITE_OTHER): Payer: Medicaid Other | Admitting: Family

## 2017-05-19 VITALS — Ht 63.0 in | Wt 171.0 lb

## 2017-05-19 DIAGNOSIS — E1042 Type 1 diabetes mellitus with diabetic polyneuropathy: Secondary | ICD-10-CM | POA: Diagnosis not present

## 2017-05-19 DIAGNOSIS — Z89431 Acquired absence of right foot: Secondary | ICD-10-CM | POA: Diagnosis not present

## 2017-05-19 DIAGNOSIS — L97411 Non-pressure chronic ulcer of right heel and midfoot limited to breakdown of skin: Secondary | ICD-10-CM

## 2017-05-19 NOTE — Progress Notes (Signed)
Office Visit Note   Patient: Suzanne Allen           Date of Birth: [birth year]           MRN: [id number] Visit Date: 05/19/2017              Requested by: Barrie Lyme, [address] [place], [place] [address] PCP: Barrie Lyme, FNP  Chief Complaint  Patient presents with  . Right Foot - Pain    Hx transmet amputation      HPI: Patient is a 56 year old woman seen today for evaluation of right foot callus and ulceration. Has had a transmetatarsal amputation on right in past. Has tried sole orthotics and custom orthotics, states both rubbed and had issues with bilsters. Did not ever have a spacer.   Today has two calluses, one with open ulcer. Has been in her Darco for last 2 weeks. States has opened and been worse over last 2 weeks. No redness or drainage. Has been having worse burning pain to plantar aspect of foot. Does have neuropathy.  Does take Cymbalta 60 mg daily.   Assessment & Plan: Visit Diagnoses:  1. Ulcer of right heel, limited to breakdown of skin ([place])   2. Status post transmetatarsal amputation of foot, right ([place])   3. Diabetic polyneuropathy associated with type 1 diabetes mellitus (HCC)     Plan: Mupirocin dressing changes daily. Did provide felt pressure relieving donuts. Will have her discontinue the Darco as is not offloading the ulcerative area. Provided order to [facility] for custom orthotics with spacer. Follow up in office in 2 weeks for wound check.   Follow-Up Instructions: Return in about 2 weeks (around 06/02/2017).   Ortho Exam  Patient is alert, oriented, no adenopathy, well-dressed, normal affect, normal respiratory effort. On examination of right foot has callus to distal lateral column as well as beneath the distal 1st MT. The lateral callus is 2 cm in diameter and 2 mm thick. No open areas. Medially ulcer is also 2 cm in diameter and 3 mm thick. There is central ulceration. This was pared with a 10 blade knife. Ulceration  is 3 mm in diameter and 2 mm deep. Granulation present. No drainage. No odor. No maceration. No sign of infection.   Imaging: No results found. No images are attached to the encounter.  Labs: Lab Results  Component Value Date   HGBA1C 10.9 (H) 07/08/2015   HGBA1C 16.0 (H) 03/14/2015   HGBA1C 10.3 (H) 04/04/2014   ESRSEDRATE 128 (H) 07/23/2016   ESRSEDRATE 97 (H) 07/08/2015   ESRSEDRATE 51 (H) 10/12/2012   CRP 16.1 (H) 07/23/2016   CRP 5.9 (H) 07/08/2015   CRP 5.5 (H) 10/12/2012   REPTSTATUS 07/28/2016 FINAL 07/23/2016   CULT NO GROWTH 5 DAYS 07/23/2016    Orders:  No orders of the defined types were placed in this encounter.  No orders of the defined types were placed in this encounter.    Procedures: No procedures performed  Clinical Data: No additional findings.  ROS:  All other systems negative, except as noted in the HPI. Review of Systems  Constitutional: Negative for chills and fever.  Skin: Positive for wound. Negative for color change and rash.    Objective: Vital Signs: Ht 5\' 3"  (1.6 m)   Wt 171 lb (77.6 kg)   BMI 30.29 kg/m   Specialty Comments:  No specialty comments available.  PMFS History: Patient Active Problem List  Diagnosis Date Noted  . AKI (acute kidney injury) ([place]) 07/29/2016  . Cellulitis of left foot 07/23/2016  . Status post transmetatarsal amputation of foot, right ([place]) 07/17/2016  . Major depressive disorder, recurrent episode, mild ([place]) 05/04/2016  . Osteomyelitis ([place]) 07/08/2015  . Poorly controlled diabetes mellitus ([place]) 07/08/2015  . Depression with anxiety 07/08/2015  . Lactic acid acidosis 07/08/2015  . BP (high blood pressure) 06/17/2015  . 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 Hypothyroidism, postop 06/17/2015  . 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 Proliferative diabetic retinopathy ([place]) 06/17/2015  . Dysesthesia 06/17/2015  . CAP (community acquired pneumonia) 03/13/2015  . Dyspnea  03/13/2015  . Respiratory failure with hypoxia ([place]) 03/13/2015  . Pericardial effusion 03/13/2015  . Depression 02/15/2015  . Carpal tunnel syndrome 11/28/2014  . Diabetes, polyneuropathy ([place]) 11/15/2014  . Numbness 11/15/2014  . Gait disturbance 11/15/2014  . Hand weakness 11/15/2014  . Insomnia 11/15/2014  . DDD (degenerative disc disease), lumbar 04/10/2014  . Diarrhea 04/05/2014  . Dyslipidemia 04/04/2014  . 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 Fatty liver 04/04/2014  . Acute pulmonary edema ([place]) 04/04/2014  . Hypokalemia 04/04/2014  . Elevated LFTs 04/04/2014  . Benign essential HTN 04/02/2014  . Hypothyroidism 04/02/2014  . Diabetes ([place]) 10/10/2013  . Pancreatitis, acute 10/10/2013  . Leukocytosis 10/04/2013  . Acute pancreatitis 10/04/2013  . Diabetic neuropathy ([place]) 10/03/2013  . TIA (transient ischemic attack) 09/23/2012  . Temporary cerebral vascular dysfunction 09/23/2012  . Diabetic osteomyelitis ([place]) 08/29/2012  . Diabetes type 1, uncontrolled ([place]) 08/29/2012  . Type 1 diabetes mellitus ([place]) 08/29/2012  . Type 2 diabetes mellitus ([place]) 08/29/2012  . Diabetic foot ulcer ([place]) 07/27/2012  . Anxiety 08/24/2011  . Arthralgia of multiple joints 10/01/2010  . HLD (hyperlipidemia) 04/25/2010  . Avitaminosis D 04/25/2010  . Allergic rhinitis 08/16/2008   Past Medical History:  Diagnosis Date  . 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 Anxiety   . CAP (community acquired pneumonia) 03/13/2015  . Carpal tunnel syndrome 11/28/2014   Bilateral  . Chronic pancreatitis ([place])   . Depression   . Diabetes mellitus type I ([place]) dx'd 1977  . Diabetic osteomyelitis ([place])    right great toe/progress notes 09/23/2012  . Diabetic retinopathy ([place])   . Fatty liver 04/04/2014  . HTN (hypertension)   . Hypercholesteremia   . Hypothyroidism   . Macular degeneration, bilateral   . Migraines 1980's   "when I was in my 20's"  (03/14/2015)  . Peripheral neuropathy   . Pneumonia 1993  . PTSD (post-traumatic stress disorder)   . Stroke [facility]) ? date & 09/22/2012  . TIA (transient ischemic attack) 09/22/2012   [name] 10/25/2012    Family History  Problem Relation Age of Onset  . Depression Mother   . Diabetes Mother   . Alcohol abuse Father   . Alcohol abuse Sister   . Diabetes Maternal Grandmother     Past Surgical History:  Procedure Laterality Date  . [place]; 1995  . EYE SURGERY Bilateral    "hemorrhaged behind my eye"  . I&D EXTREMITY Right multiple   "big toe"  . PERIPHERALLY INSERTED CENTRAL CATHETER INSERTION Right 08/31/2012  . PICC LINE REMOVAL ([place] [facility]X) Right 10/12/2012   [name] 10/12/2012  . REFRACTIVE SURGERY Bilateral 2013  . TOE AMPUTATION Right 11/2012   "big toe"  . TONSILLECTOMY  1965  . TOTAL THYROIDECTOMY  2012  . [place]  Social History   Occupational History  . Not on file  Tobacco Use  . Smoking status: Never Smoker  . Smokeless tobacco: Never Used  Substance and Sexual Activity  . Alcohol use: No    Alcohol/week: 0.0 oz  . Drug use: No  . Sexual activity: Not Currently

## 2017-06-07 ENCOUNTER — Ambulatory Visit (INDEPENDENT_AMBULATORY_CARE_PROVIDER_SITE_OTHER): Payer: Medicaid Other | Admitting: Family

## 2017-06-07 ENCOUNTER — Encounter (INDEPENDENT_AMBULATORY_CARE_PROVIDER_SITE_OTHER): Payer: Self-pay | Admitting: Family

## 2017-06-07 VITALS — Ht 63.0 in | Wt 171.0 lb

## 2017-06-07 DIAGNOSIS — Z89431 Acquired absence of right foot: Secondary | ICD-10-CM | POA: Diagnosis not present

## 2017-06-10 ENCOUNTER — Telehealth (INDEPENDENT_AMBULATORY_CARE_PROVIDER_SITE_OTHER): Payer: Self-pay | Admitting: Orthopedic Surgery

## 2017-06-10 NOTE — Telephone Encounter (Signed)
Jasmine from the [facility] called this morning stating that she received the office notes but demographics or script.  Could someone please fax that to her at [phone].  Thank you.

## 2017-06-11 ENCOUNTER — Encounter (INDEPENDENT_AMBULATORY_CARE_PROVIDER_SITE_OTHER): Payer: Self-pay | Admitting: Family

## 2017-06-11 NOTE — Progress Notes (Signed)
Office Visit Note   Patient: Suzanne Allen           Date of Birth: 1960/08/24           MRN: [id number] Visit Date: 06/07/2017              Requested by: Barrie Lyme, [address] [place], [place] [address] PCP: Barrie Lyme, FNP  Chief Complaint  Patient presents with  . Right Foot - Follow-up    Right transmet amputation 06/15/15 ulcer       HPI: Patient is a 56 year old woman seen today for in follow up for right foot callus and ulceration. Has had a transmetatarsal amputation on right in past. Has tried sole orthotics and custom orthotics, states both rubbed and had issues with bilsters. Did not ever have a spacer.   Does take Cymbalta 60 mg daily.   Assessment & Plan: Visit Diagnoses:  No diagnosis found.  Plan: Mupirocin dressing changes daily. Follow with [facility] for custom orthotics with spacer. Follow up in office in 2 weeks for wound check.   Follow-Up Instructions: No Follow-up on file.   Ortho Exam  Patient is alert, oriented, no adenopathy, well-dressed, normal affect, normal respiratory effort. On examination of right foot has callus to distal lateral column as well as beneath the distal 1st MT. The lateral callus is stable in size. No open areas. Medially ulcer is also 2 cm in diameter and 3 mm thick. There is central ulceration. This was pared with a 10 blade knife. Ulceration is 3 mm in diameter and 2 mm deep. Granulation present. No drainage. No odor. No maceration. No sign of infection.   Imaging: No results found. No images are attached to the encounter.  Labs: Lab Results  Component Value Date   HGBA1C 10.9 (H) 07/08/2015   HGBA1C 16.0 (H) 03/14/2015   HGBA1C 10.3 (H) 04/04/2014   ESRSEDRATE 128 (H) 07/23/2016   ESRSEDRATE 97 (H) 07/08/2015   ESRSEDRATE 51 (H) 10/12/2012   CRP 16.1 (H) 07/23/2016   CRP 5.9 (H) 07/08/2015   CRP 5.5 (H) 10/12/2012   REPTSTATUS 07/28/2016 FINAL 07/23/2016   CULT NO GROWTH 5 DAYS  07/23/2016    Orders:  No orders of the defined types were placed in this encounter.  No orders of the defined types were placed in this encounter.    Procedures: No procedures performed  Clinical Data: No additional findings.  ROS:  All other systems negative, except as noted in the HPI. Review of Systems  Constitutional: Negative for chills and fever.  Skin: Positive for wound. Negative for color change and rash.    Objective: Vital Signs: Ht 5\' 3"  (1.6 m)   Wt 171 lb (77.6 kg)   BMI 30.29 kg/m   Specialty Comments:  No specialty comments available.  PMFS History: Patient Active Problem List   Diagnosis Date Noted  . AKI (acute kidney injury) ([place]) 07/29/2016  . Cellulitis of left foot 07/23/2016  . Status post transmetatarsal amputation of foot, right ([place]) 07/17/2016  . Major depressive disorder, recurrent episode, mild ([place]) 05/04/2016  . Osteomyelitis ([place]) 07/08/2015  . Poorly controlled diabetes mellitus ([place]) 07/08/2015  . Depression with anxiety 07/08/2015  . Lactic acid acidosis 07/08/2015  . BP (high blood pressure) 06/17/2015  . 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 Hypothyroidism, postop 06/17/2015  . 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 Proliferative diabetic retinopathy ([place]) 06/17/2015  . Dysesthesia 06/17/2015  . CAP (  community acquired pneumonia) 03/13/2015  . Dyspnea 03/13/2015  . Respiratory failure with hypoxia ([place]) 03/13/2015  . Pericardial effusion 03/13/2015  . Depression 02/15/2015  . Carpal tunnel syndrome 11/28/2014  . Diabetes, polyneuropathy ([place]) 11/15/2014  . Numbness 11/15/2014  . Gait disturbance 11/15/2014  . Hand weakness 11/15/2014  . Insomnia 11/15/2014  . DDD (degenerative disc disease), lumbar 04/10/2014  . Diarrhea 04/05/2014  . Dyslipidemia 04/04/2014  . 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 Fatty liver 04/04/2014  . Acute pulmonary edema ([place]) 04/04/2014  .  Hypokalemia 04/04/2014  . Elevated LFTs 04/04/2014  . Benign essential HTN 04/02/2014  . Hypothyroidism 04/02/2014  . Diabetes ([place]) 10/10/2013  . Pancreatitis, acute 10/10/2013  . Leukocytosis 10/04/2013  . Acute pancreatitis 10/04/2013  . Diabetic neuropathy ([place]) 10/03/2013  . TIA (transient ischemic attack) 09/23/2012  . Temporary cerebral vascular dysfunction 09/23/2012  . Diabetic osteomyelitis ([place]) 08/29/2012  . Diabetes type 1, uncontrolled ([place]) 08/29/2012  . Type 1 diabetes mellitus ([place]) 08/29/2012  . Type 2 diabetes mellitus ([place]) 08/29/2012  . Diabetic foot ulcer ([place]) 07/27/2012  . Anxiety 08/24/2011  . Arthralgia of multiple joints 10/01/2010  . HLD (hyperlipidemia) 04/25/2010  . Avitaminosis D 04/25/2010  . Allergic rhinitis 08/16/2008   Past Medical History:  Diagnosis Date  . 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 Anxiety   . CAP (community acquired pneumonia) 03/13/2015  . Carpal tunnel syndrome 11/28/2014   Bilateral  . Chronic pancreatitis ([place])   . Depression   . Diabetes mellitus type I ([place]) dx'd 1977  . Diabetic osteomyelitis ([place])    right great toe/progress notes 09/23/2012  . Diabetic retinopathy ([place])   . Fatty liver 04/04/2014  . HTN (hypertension)   . Hypercholesteremia   . Hypothyroidism   . Macular degeneration, bilateral   . Migraines 1980's   "when I was in my 20's" (03/14/2015)  . Peripheral neuropathy   . Pneumonia 1993  . PTSD (post-traumatic stress disorder)   . Stroke [facility]) ? date & 09/22/2012  . TIA (transient ischemic attack) 09/22/2012   [name] 10/25/2012    Family History  Problem Relation Age of Onset  . Depression Mother   . Diabetes Mother   . Alcohol abuse Father   . Alcohol abuse Sister   . Diabetes Maternal Grandmother     Past Surgical History:  Procedure Laterality Date  . AMPUTATION Right 07/12/2015   Procedure: Right Transmetatarsal amputation;  Surgeon: Newt Minion, MD;  Location: [facility];   Service: Orthopedics;  Laterality: Right;  . [place]; 1995  . EYE SURGERY Bilateral    "hemorrhaged behind my eye"  . I&D EXTREMITY Right multiple   "big toe"  . PERIPHERALLY INSERTED CENTRAL CATHETER INSERTION Right 08/31/2012  . PICC LINE REMOVAL ([place] [facility]X) Right 10/12/2012   [name] 10/12/2012  . REFRACTIVE SURGERY Bilateral 2013  . TOE AMPUTATION Right 11/2012   "big toe"  . TONSILLECTOMY  1965  . TOTAL THYROIDECTOMY  2012  . TUBAL LIGATION  1995   Social History   Occupational History  . Not on file  Tobacco Use  . Smoking status: Never Smoker  . Smokeless tobacco: Never Used  Substance and Sexual Activity  . Alcohol use: No    Alcohol/week: 0.0 oz  . Drug use: No  . Sexual activity: Not Currently

## 2017-06-11 NOTE — Telephone Encounter (Signed)
Faxing to [employer] this AM at land line fax machine

## 2017-08-05 ENCOUNTER — Ambulatory Visit (INDEPENDENT_AMBULATORY_CARE_PROVIDER_SITE_OTHER): Payer: Medicaid Other | Admitting: Psychiatry

## 2017-08-05 ENCOUNTER — Encounter (HOSPITAL_COMMUNITY): Payer: Self-pay | Admitting: Psychiatry

## 2017-08-05 DIAGNOSIS — M2550 Pain in unspecified joint: Secondary | ICD-10-CM | POA: Diagnosis not present

## 2017-08-05 DIAGNOSIS — F330 Major depressive disorder, recurrent, mild: Secondary | ICD-10-CM | POA: Diagnosis not present

## 2017-08-05 DIAGNOSIS — Z811 Family history of alcohol abuse and dependence: Secondary | ICD-10-CM | POA: Diagnosis not present

## 2017-08-05 DIAGNOSIS — Z62811 Personal history of psychological abuse in childhood: Secondary | ICD-10-CM

## 2017-08-05 DIAGNOSIS — Z818 Family history of other mental and behavioral disorders: Secondary | ICD-10-CM

## 2017-08-05 DIAGNOSIS — R441 Visual hallucinations: Secondary | ICD-10-CM | POA: Diagnosis not present

## 2017-08-05 MED ORDER — CLONAZEPAM 1 MG PO TABS: 1.0000 mg | ORAL_TABLET | Freq: Every day | ORAL | 2 refills | Status: DC

## 2017-08-05 MED ORDER — DULOXETINE HCL 60 MG PO CPEP: 60.0000 mg | ORAL_CAPSULE | Freq: Every day | ORAL | 2 refills | Status: DC
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
## 2017-09-20 ENCOUNTER — Other Ambulatory Visit: Payer: Self-pay

## 2017-09-20 ENCOUNTER — Encounter (HOSPITAL_COMMUNITY): Payer: Self-pay | Admitting: Emergency Medicine

## 2017-09-20 ENCOUNTER — Inpatient Hospital Stay (HOSPITAL_COMMUNITY)
Admission: EM | Admit: 2017-09-20 | Discharge: 2017-09-23 | DRG: 563 | Disposition: A | Discharge status: Home / Self Care | Payer: Medicaid Other | Attending: Family Medicine | Admitting: Family Medicine

## 2017-09-20 ENCOUNTER — Emergency Department (HOSPITAL_COMMUNITY): Payer: Medicaid Other

## 2017-09-20 DIAGNOSIS — R071 Chest pain on breathing: Secondary | ICD-10-CM | POA: Diagnosis not present

## 2017-09-20 DIAGNOSIS — R079 Chest pain, unspecified: Secondary | ICD-10-CM

## 2017-09-20 DIAGNOSIS — E782 Mixed hyperlipidemia: Secondary | ICD-10-CM | POA: Diagnosis present

## 2017-09-20 DIAGNOSIS — E876 Hypokalemia: Secondary | ICD-10-CM | POA: Diagnosis not present

## 2017-09-20 DIAGNOSIS — Z818 Family history of other mental and behavioral disorders: Secondary | ICD-10-CM

## 2017-09-20 DIAGNOSIS — Z8739 Personal history of other diseases of the musculoskeletal system and connective tissue: Secondary | ICD-10-CM | POA: Diagnosis not present

## 2017-09-20 DIAGNOSIS — X58XXXA Exposure to other specified factors, initial encounter: Secondary | ICD-10-CM | POA: Diagnosis present

## 2017-09-20 DIAGNOSIS — S29011A Strain of muscle and tendon of front wall of thorax, initial encounter: Secondary | ICD-10-CM | POA: Diagnosis not present

## 2017-09-20 DIAGNOSIS — I5032 Chronic diastolic (congestive) heart failure: Secondary | ICD-10-CM | POA: Diagnosis not present

## 2017-09-20 DIAGNOSIS — Z881 Allergy status to other antibiotic agents status: Secondary | ICD-10-CM

## 2017-09-20 DIAGNOSIS — Z9851 Tubal ligation status: Secondary | ICD-10-CM | POA: Diagnosis not present

## 2017-09-20 DIAGNOSIS — N183 Chronic kidney disease, stage 3 (moderate): Secondary | ICD-10-CM | POA: Diagnosis not present

## 2017-09-20 DIAGNOSIS — E1022 Type 1 diabetes mellitus with diabetic chronic kidney disease: Secondary | ICD-10-CM | POA: Diagnosis not present

## 2017-09-20 DIAGNOSIS — Z6831 Body mass index (BMI) 31.0-31.9, adult: Secondary | ICD-10-CM

## 2017-09-20 DIAGNOSIS — I130 Hypertensive heart and chronic kidney disease with heart failure and stage 1 through stage 4 chronic kidney disease, or unspecified chronic kidney disease: Secondary | ICD-10-CM | POA: Diagnosis not present

## 2017-09-20 DIAGNOSIS — E1042 Type 1 diabetes mellitus with diabetic polyneuropathy: Secondary | ICD-10-CM | POA: Diagnosis not present

## 2017-09-20 DIAGNOSIS — N179 Acute kidney failure, unspecified: Secondary | ICD-10-CM

## 2017-09-20 DIAGNOSIS — R0781 Pleurodynia: Secondary | ICD-10-CM | POA: Diagnosis not present

## 2017-09-20 DIAGNOSIS — Z89421 Acquired absence of other right toe(s): Secondary | ICD-10-CM | POA: Diagnosis not present

## 2017-09-20 DIAGNOSIS — E663 Overweight: Secondary | ICD-10-CM | POA: Diagnosis present

## 2017-09-20 DIAGNOSIS — F4310 Post-traumatic stress disorder, unspecified: Secondary | ICD-10-CM | POA: Diagnosis present

## 2017-09-20 DIAGNOSIS — Z89411 Acquired absence of right great toe: Secondary | ICD-10-CM

## 2017-09-20 DIAGNOSIS — Z8673 Personal history of transient ischemic attack (TIA), and cerebral infarction without residual deficits: Secondary | ICD-10-CM | POA: Diagnosis not present

## 2017-09-20 DIAGNOSIS — Z79890 Hormone replacement therapy (postmenopausal): Secondary | ICD-10-CM

## 2017-09-20 DIAGNOSIS — F330 Major depressive disorder, recurrent, mild: Secondary | ICD-10-CM | POA: Diagnosis not present

## 2017-09-20 DIAGNOSIS — D649 Anemia, unspecified: Secondary | ICD-10-CM | POA: Diagnosis not present

## 2017-09-20 DIAGNOSIS — D72829 Elevated white blood cell count, unspecified: Secondary | ICD-10-CM | POA: Diagnosis not present

## 2017-09-20 DIAGNOSIS — Z79899 Other long term (current) drug therapy: Secondary | ICD-10-CM

## 2017-09-20 DIAGNOSIS — Z811 Family history of alcohol abuse and dependence: Secondary | ICD-10-CM

## 2017-09-20 DIAGNOSIS — Z833 Family history of diabetes mellitus: Secondary | ICD-10-CM

## 2017-09-20 DIAGNOSIS — E890 Postprocedural hypothyroidism: Secondary | ICD-10-CM | POA: Diagnosis present

## 2017-09-20 DIAGNOSIS — R0789 Other chest pain: Secondary | ICD-10-CM | POA: Diagnosis present

## 2017-09-20 DIAGNOSIS — Z886 Allergy status to analgesic agent status: Secondary | ICD-10-CM

## 2017-09-20 DIAGNOSIS — F329 Major depressive disorder, single episode, unspecified: Secondary | ICD-10-CM | POA: Diagnosis not present

## 2017-09-20 DIAGNOSIS — Z9089 Acquired absence of other organs: Secondary | ICD-10-CM

## 2017-09-20 DIAGNOSIS — Z8679 Personal history of other diseases of the circulatory system: Secondary | ICD-10-CM

## 2017-09-20 DIAGNOSIS — IMO0001 Reserved for inherently not codable concepts without codable children: Secondary | ICD-10-CM

## 2017-09-20 DIAGNOSIS — I129 Hypertensive chronic kidney disease with stage 1 through stage 4 chronic kidney disease, or unspecified chronic kidney disease: Secondary | ICD-10-CM | POA: Diagnosis not present

## 2017-09-20 DIAGNOSIS — K861 Other chronic pancreatitis: Secondary | ICD-10-CM | POA: Diagnosis present

## 2017-09-20 DIAGNOSIS — Z794 Long term (current) use of insulin: Secondary | ICD-10-CM

## 2017-09-20 LAB — BASIC METABOLIC PANEL
Anion gap: 12 (ref 5–15)
BUN: 63 mg/dL — ABNORMAL HIGH (ref 6–20)
CO2: 20 mmol/L — ABNORMAL LOW (ref 22–32)
Calcium: 7.3 mg/dL — ABNORMAL LOW (ref 8.9–10.3)
Chloride: 105 mmol/L (ref 101–111)
Creatinine, Ser: 3.98 mg/dL — ABNORMAL HIGH (ref 0.44–1.00)
GFR calc Af Amer: 14 mL/min — ABNORMAL LOW (ref >60)
GFR calc non Af Amer: 12 mL/min — ABNORMAL LOW (ref >60)
Glucose, Bld: 267 mg/dL — ABNORMAL HIGH (ref 65–99)
Potassium: 4 mmol/L (ref 3.5–5.1)
Sodium: 137 mmol/L (ref 135–145)

## 2017-09-20 LAB — HEMOGLOBIN A1C
Hgb A1c MFr Bld: 9.3 % — ABNORMAL HIGH (ref 4.8–5.6)
Mean Plasma Glucose: 220.21 mg/dL

## 2017-09-20 LAB — CBC
HCT: 28.3 % — ABNORMAL LOW (ref 36.0–46.0)
Hemoglobin: 9.5 g/dL — ABNORMAL LOW (ref 12.0–15.0)
MCH: 29.9 pg (ref 26.0–34.0)
MCHC: 33.6 g/dL (ref 30.0–36.0)
MCV: 89 fL (ref 78.0–100.0)
Platelets: 362 10*3/uL (ref 150–400)
RBC: 3.18 MIL/uL — ABNORMAL LOW (ref 3.87–5.11)
RDW: 13 % (ref 11.5–15.5)
WBC: 13 10*3/uL — ABNORMAL HIGH (ref 4.0–10.5)

## 2017-09-20 LAB — I-STAT TROPONIN, ED
Troponin i, poc: 0 ng/mL (ref 0.00–0.08)
Troponin i, poc: 0 ng/mL (ref 0.00–0.08)

## 2017-09-20 LAB — GLUCOSE, CAPILLARY: Glucose-Capillary: 340 mg/dL — ABNORMAL HIGH (ref 65–99)

## 2017-09-20 MED ORDER — ASPIRIN EC 81 MG PO TBEC
81.0000 mg | DELAYED_RELEASE_TABLET | Freq: Every day | ORAL | Status: DC
  Administered 2017-09-20 – 2017-09-23 (×4): 81 mg via ORAL
  Filled 2017-09-20 (×4): qty 1

## 2017-09-20 MED ORDER — LEVOTHYROXINE SODIUM 75 MCG PO TABS
150.0000 ug | ORAL_TABLET | Freq: Every day | ORAL | Status: DC
  Administered 2017-09-21 – 2017-09-23 (×3): 150 ug via ORAL
  Filled 2017-09-20 (×4): qty 2

## 2017-09-20 MED ORDER — AMLODIPINE BESYLATE 5 MG PO TABS
5.0000 mg | ORAL_TABLET | Freq: Every day | ORAL | Status: DC
  Administered 2017-09-21 – 2017-09-22 (×2): 5 mg via ORAL
  Filled 2017-09-20 (×2): qty 1

## 2017-09-20 MED ORDER — ALBUTEROL SULFATE (2.5 MG/3ML) 0.083% IN NEBU: 2.5000 mg | INHALATION_SOLUTION | RESPIRATORY_TRACT | Status: DC | PRN

## 2017-09-20 MED ORDER — DULOXETINE HCL 60 MG PO CPEP
60.0000 mg | ORAL_CAPSULE | Freq: Every day | ORAL | Status: DC
  Administered 2017-09-21 – 2017-09-23 (×3): 60 mg via ORAL
  Filled 2017-09-20 (×4): qty 1

## 2017-09-20 MED ORDER — DULOXETINE HCL 60 MG PO CPEP: 60.0000 mg | ORAL_CAPSULE | Freq: Every day | ORAL | Status: DC

## 2017-09-20 MED ORDER — ENOXAPARIN SODIUM 30 MG/0.3ML ~~LOC~~ SOLN
30.0000 mg | SUBCUTANEOUS | Status: DC
  Administered 2017-09-20 – 2017-09-22 (×3): 30 mg via SUBCUTANEOUS
  Filled 2017-09-20 (×3): qty 0.3

## 2017-09-20 MED ORDER — INSULIN GLARGINE 100 UNIT/ML ~~LOC~~ SOLN
30.0000 [IU] | Freq: Every day | SUBCUTANEOUS | Status: DC
  Administered 2017-09-20 – 2017-09-21 (×2): 30 [IU] via SUBCUTANEOUS
  Filled 2017-09-20 (×2): qty 0.3

## 2017-09-20 MED ORDER — CLONAZEPAM 0.5 MG PO TABS
1.0000 mg | ORAL_TABLET | Freq: Every day | ORAL | Status: DC
Start: 2017-09-20 — End: 2017-09-23
  Administered 2017-09-20 – 2017-09-22 (×3): 1 mg via ORAL
  Filled 2017-09-20 (×3): qty 2

## 2017-09-20 MED ORDER — INSULIN ASPART 100 UNIT/ML ~~LOC~~ SOLN
0.0000 [IU] | Freq: Three times a day (TID) | SUBCUTANEOUS | Status: DC
  Administered 2017-09-21 (×2): 2 [IU] via SUBCUTANEOUS
  Administered 2017-09-22: 5 [IU] via SUBCUTANEOUS
  Administered 2017-09-22 (×2): 3 [IU] via SUBCUTANEOUS
  Administered 2017-09-23: 2 [IU] via SUBCUTANEOUS

## 2017-09-20 MED ORDER — ROSUVASTATIN CALCIUM 20 MG PO TABS
40.0000 mg | ORAL_TABLET | Freq: Every day | ORAL | Status: DC
  Administered 2017-09-20 – 2017-09-23 (×4): 40 mg via ORAL
  Filled 2017-09-20 (×5): qty 2

## 2017-09-20 MED ORDER — ACETAMINOPHEN 650 MG RE SUPP: 650.0000 mg | Freq: Four times a day (QID) | RECTAL | Status: DC | PRN

## 2017-09-20 MED ORDER — SODIUM CHLORIDE 0.9 % IV SOLN
INTRAVENOUS | Status: AC
  Administered 2017-09-20 – 2017-09-21 (×2): via INTRAVENOUS

## 2017-09-20 MED ORDER — ACETAMINOPHEN 325 MG PO TABS
650.0000 mg | ORAL_TABLET | Freq: Four times a day (QID) | ORAL | Status: DC | PRN
  Administered 2017-09-22: 650 mg via ORAL
  Filled 2017-09-20 (×2): qty 2

## 2017-09-20 MED ORDER — CYCLOBENZAPRINE HCL 10 MG PO TABS
5.0000 mg | ORAL_TABLET | Freq: Two times a day (BID) | ORAL | Status: DC | PRN
  Administered 2017-09-21 – 2017-09-22 (×2): 5 mg via ORAL
  Filled 2017-09-20 (×3): qty 1

## 2017-09-20 MED ORDER — FENTANYL CITRATE (PF) 100 MCG/2ML IJ SOLN
50.0000 ug | Freq: Once | INTRAMUSCULAR | Status: AC
Start: 2017-09-20 — End: 2017-09-20
  Administered 2017-09-20: 50 ug via INTRAVENOUS
  Filled 2017-09-20: qty 2

## 2017-09-20 MED ORDER — ALBUTEROL SULFATE HFA 108 (90 BASE) MCG/ACT IN AERS: 2.0000 | INHALATION_SPRAY | RESPIRATORY_TRACT | Status: DC | PRN

## 2017-09-20 MED ORDER — SODIUM CHLORIDE 0.9 % IV BOLUS (SEPSIS)
1000.0000 mL | Freq: Once | INTRAVENOUS | Status: AC
  Administered 2017-09-20: 1000 mL via INTRAVENOUS

## 2017-09-20 NOTE — ED Notes (Signed)
Alyssa, PA at bedside at this time.

## 2017-09-20 NOTE — ED Notes (Signed)
Patient ambulated to restroom with family member assisting. Gait slow, steady. No acute distress noted.

## 2017-09-20 NOTE — ED Notes (Signed)
Attempted to call report to [facility] [address]. Was told receiving RN is in report, and not taking report at this time. Notified ED charge.

## 2017-09-20 NOTE — ED Provider Notes (Signed)
[facility] EMERGENCY DEPARTMENT Provider Note   CSN: [id number] Arrival date & time: 09/20/17  1018     History   Chief Complaint Chief Complaint  Patient presents with  . Chest Pain  . Shortness of Breath    HPI Suzanne Allen is a 57 y.o. female.  HPI  Patient is a 57 year old female with a history of type 1 diabetes mellitus, diabetic osteomyelitis, CKD stage III, and TIA presenting for left-sided chest pain.  Patient reports that the pain began gradually over the last 2 days.  Patient reports that the pain gets worse and sharper with deep inspiration, moving, or sitting upright.  Patient reports that the pain radiates around underneath the left breast. Patient denies ever feeling this pain before.  Patient reports that the pain makes her short of breath with movement.  Patient denies any nausea when the pain is severe, diaphoresis, or abdominal pain.  Patient reports she has felt more chilled over the past 2 days, but denies recorded fevers.  Patient denies any personal history of DVT/PE, estrogen use, recent hospitalization, recent surgery, recent immobilization, hemoptysis, treatment for cancer.  Patient reports that Tylenol has not improved her symptoms.  Past Medical History:  Diagnosis Date  . 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 Anxiety   . CAP (community acquired pneumonia) 03/13/2015  . Carpal tunnel syndrome 11/28/2014   Bilateral  . Chronic pancreatitis ([place])   . Depression   . Diabetes mellitus type I ([place]) dx'd 1977  . Diabetic osteomyelitis ([place])    right great toe/progress notes 09/23/2012  . Diabetic retinopathy ([place])   . Fatty liver 04/04/2014  . HTN (hypertension)   . Hypercholesteremia   . Hypothyroidism   . Macular degeneration, bilateral   . Migraines 1980's   "when I was in my 20's" (03/14/2015)  . Peripheral neuropathy   . Pneumonia 1993  . PTSD (post-traumatic stress disorder)   . Stroke [facility]) ? date & 09/22/2012   . TIA (transient ischemic attack) 09/22/2012   [name] 10/25/2012    Patient Active Problem List   Diagnosis Date Noted  . AKI (acute kidney injury) ([place]) 07/29/2016  . Cellulitis of left foot 07/23/2016  . Status post transmetatarsal amputation of foot, right ([place]) 07/17/2016  . Major depressive disorder, recurrent episode, mild ([place]) 05/04/2016  . Osteomyelitis ([place]) 07/08/2015  . Poorly controlled diabetes mellitus ([place]) 07/08/2015  . Depression with anxiety 07/08/2015  . Lactic acid acidosis 07/08/2015  . BP (high blood pressure) 06/17/2015  . 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 Hypothyroidism, postop 06/17/2015  . 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 Proliferative diabetic retinopathy ([place]) 06/17/2015  . Dysesthesia 06/17/2015  . CAP (community acquired pneumonia) 03/13/2015  . Dyspnea 03/13/2015  . Respiratory failure with hypoxia ([place]) 03/13/2015  . Pericardial effusion 03/13/2015  . Depression 02/15/2015  . Carpal tunnel syndrome 11/28/2014  . Diabetes, polyneuropathy ([place]) 11/15/2014  . Numbness 11/15/2014  . Gait disturbance 11/15/2014  . Hand weakness 11/15/2014  . Insomnia 11/15/2014  . DDD (degenerative disc disease), lumbar 04/10/2014  . Diarrhea 04/05/2014  . Dyslipidemia 04/04/2014  . 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 Fatty liver 04/04/2014  . Acute pulmonary edema ([place]) 04/04/2014  . Hypokalemia 04/04/2014  . Elevated LFTs 04/04/2014  . Benign essential HTN 04/02/2014  . Hypothyroidism 04/02/2014  . Diabetes ([place]) 10/10/2013  . Pancreatitis, acute 10/10/2013  . Leukocytosis 10/04/2013  . Acute pancreatitis 10/04/2013  . Diabetic neuropathy ([place]) 10/03/2013  .  TIA (transient ischemic attack) 09/23/2012  . Temporary cerebral vascular dysfunction 09/23/2012  . Diabetic osteomyelitis ([place]) 08/29/2012  . Diabetes type 1, uncontrolled ([place]) 08/29/2012  . Type 1 diabetes mellitus ([place]) 08/29/2012  . Type  2 diabetes mellitus ([place]) 08/29/2012  . Diabetic foot ulcer ([place]) 07/27/2012  . Anxiety 08/24/2011  . Arthralgia of multiple joints 10/01/2010  . HLD (hyperlipidemia) 04/25/2010  . Avitaminosis D 04/25/2010  . Allergic rhinitis 08/16/2008    Past Surgical History:  Procedure Laterality Date  . AMPUTATION Right 07/12/2015   Procedure: Right Transmetatarsal amputation;  Surgeon: Newt Minion, MD;  Location: [facility];  Service: Orthopedics;  Laterality: Right;  . [place]; 1995  . EYE SURGERY Bilateral    "hemorrhaged behind my eye"  . I&D EXTREMITY Right multiple   "big toe"  . PERIPHERALLY INSERTED CENTRAL CATHETER INSERTION Right 08/31/2012  . PICC LINE REMOVAL ([place] HX) Right 10/12/2012   [name] 10/12/2012  . REFRACTIVE SURGERY Bilateral 2013  . TOE AMPUTATION Right 11/2012   "big toe"  . TONSILLECTOMY  1965  . TOTAL THYROIDECTOMY  2012  . TUBAL LIGATION  1995    OB History    No data available       Home Medications    Prior to Admission medications   Medication Sig Start Date End Date Taking? Authorizing Provider  albuterol (PROVENTIL HFA;VENTOLIN HFA) 108 (90 Base) MCG/ACT inhaler Inhale 2 puffs into the lungs every 4 (four) hours as needed for wheezing. 11/13/14  Yes [provider]  amLODipine (NORVASC) 5 MG tablet Take 5 mg by mouth daily.  01/03/16  Yes [provider]  clonazePAM (KLONOPIN) 1 MG tablet Take 1 tablet (1 mg total) by mouth at bedtime. 08/05/17  Yes Arfeen, Arlyce Harman, MD  CRESTOR 40 MG tablet Take 1 tablet by mouth daily 11/06/14  Yes [provider]  DULoxetine (CYMBALTA) 60 MG capsule Take 1 capsule (60 mg total) by mouth daily. 08/05/17  Yes Arfeen, Arlyce Harman, MD  hydrochlorothiazide (HYDRODIURIL) 25 MG tablet Take 25 mg by mouth daily.   Yes [provider]  insulin aspart (NOVOLOG) 100 UNIT/ML injection Inject 20 Units into the skin 3 (three) times daily.    Yes [provider]  insulin glargine (LANTUS)  100 UNIT/ML injection Inject 40 Units into the skin 2 (two) times daily.   Yes [provider]  levothyroxine (SYNTHROID, LEVOTHROID) 150 MCG tablet Take 150 mcg by mouth daily before breakfast.   Yes [provider]  lisinopril (PRINIVIL,ZESTRIL) 40 MG tablet Take 40 mg by mouth daily.   Yes [provider]    Family History Family History  Problem Relation Age of Onset  . Depression Mother   . Diabetes Mother   . Alcohol abuse Father   . Alcohol abuse Sister   . Diabetes Maternal Grandmother     Social History Social History   Tobacco Use  . Smoking status: Never Smoker  . Smokeless tobacco: Never Used  Substance Use Topics  . Alcohol use: No    Alcohol/week: 0.0 oz  . Drug use: No     Allergies   Erythromycin; Vancomycin; and Nsaids   Review of Systems Review of Systems  Constitutional: Positive for chills. Negative for fever.  HENT: Negative for congestion and rhinorrhea.   Respiratory: Positive for chest tightness and shortness of breath. Negative for cough.   Cardiovascular: Positive for chest pain. Negative for palpitations and leg swelling.  Gastrointestinal: Negative for abdominal  pain, nausea and vomiting.  Genitourinary: Negative for dysuria, frequency and urgency.  Musculoskeletal: Negative for back pain and myalgias.  Skin: Negative for rash.  All other systems reviewed and are negative.    Physical Exam Updated Vital Signs BP 138/82 (BP Location: Right Arm)   Pulse 88   Temp 98.9 F (37.2 C) (Oral)   Resp (!) 24   SpO2 100%   Physical Exam  Constitutional: She appears well-developed and well-nourished. No distress.  HENT:  Head: Normocephalic and atraumatic.  Mouth/Throat: Oropharynx is clear and moist.  Eyes: Conjunctivae and EOM are normal. Pupils are equal, round, and reactive to light.  Neck: Normal range of motion. Neck supple.  Cardiovascular: Normal rate, regular rhythm, S1 normal and S2 normal.  No murmur  heard. Pulses:      Radial pulses are 2+ on the right side, and 2+ on the left side.  Pulmonary/Chest: Effort normal. She has no wheezes. She has rales.  Coarse crackles in bilateral lung bases.  Abdominal: Soft. She exhibits no distension. There is no tenderness. There is no guarding.  Musculoskeletal: Normal range of motion. She exhibits no edema or deformity.  Lymphadenopathy:    She has no cervical adenopathy.  Neurological: She is alert.  Cranial nerves grossly intact. Patient moves extremities symmetrically and with good coordination.  Skin: Skin is warm and dry. No rash noted. No erythema.  No vesicular rash noted on posterior left thorax.  Psychiatric: She has a normal mood and affect. Her behavior is normal. Judgment and thought content normal.  Nursing note and vitals reviewed.    ED Treatments / Results  Labs (all labs ordered are listed, but only abnormal results are displayed) Labs Reviewed  BASIC METABOLIC PANEL - Abnormal; Notable for the following components:      Result Value   CO2 20 (*)    Glucose, Bld 267 (*)    BUN 63 (*)    Creatinine, Ser 3.98 (*)    Calcium 7.3 (*)    GFR calc non Af Amer 12 (*)    GFR calc Af Amer 14 (*)    All other components within normal limits  CBC - Abnormal; Notable for the following components:   WBC 13.0 (*)    RBC 3.18 (*)    Hemoglobin 9.5 (*)    HCT 28.3 (*)    All other components within normal limits  I-STAT TROPONIN, ED  I-STAT TROPONIN, ED    EKG  EKG Interpretation  Date/Time:  Monday September 20 2017 10:26:55 EDT Ventricular Rate:  90 PR Interval:  156 QRS Duration: 70 QT Interval:  384 QTC Calculation: 469 R Axis:   6 Text Interpretation:  Normal sinus rhythm Cannot rule out Anterior infarct , age undetermined Abnormal ECG no change from previous Confirmed by Charlesetta Shanks [phone]) on 09/20/2017 3:10:17 PM       Radiology Dg Chest 2 View  Result Date: 09/20/2017 CLINICAL DATA:  Shortness of breath  EXAM: CHEST - 2 VIEW COMPARISON:  04/02/2015 FINDINGS: Normal heart size and mediastinal contours. There is no edema, consolidation, effusion, or pneumothorax. No acute osseous finding. IMPRESSION: No evidence of disease. Electronically Signed   By: Monte Fantasia M.D.   On: 09/20/2017 10:50    Procedures Procedures (including critical care time)  Medications Ordered in ED Medications  sodium chloride 0.9 % bolus 1,000 mL (not administered)  fentaNYL (SUBLIMAZE) injection 50 mcg (not administered)     Initial Impression / Assessment and Plan /  ED Course  I have reviewed the triage vital signs and the nursing notes.  Pertinent labs & imaging results that were available during my care of the patient were reviewed by me and considered in my medical decision making (see chart for details).  Clinical Course as of Sep 21 1535  Mon Sep 20, 2017  1508 ED EKG [AM]    Clinical Course User Index [AM] Albesa Seen, PA-C   Differential diagnosis includes ACS, PE, thoracic aortic dissection, Boerhaave's syndrome, cardiac tamponade, pneumothorax, incarcerated diaphragmatic hernia, cholecystitis, esophageal spasm, gastroesophageal reflux, herpes zoster of the thorax, pericarditis, pneumonia,  chest wall pain, costochondritis.   Doubt ACS, as delta troponin is negative, initial and repeat EKGs show no signs of ischemia, infarction, or arrhythmia, and HEART score 4(story, age, risk factors).  Cannot rule out pulmonary embolism at this time, as patient has acute renal failure, and we are unable to do CTPA if necessary.  Would consider VQ scan in consultation with hospital medicine.  Doubt TAD by hx, CXR showed no widening mediastinum, and pulses equal in all extremities. Vital signs stable in the emergency department. Therefore, doubt esophageal rupture, cardiac tamponade, or pneumothorax.  Pain is improved in upright position, and the EKG does not show any obvious signs of pericarditis, in addition to  the fact that patient unable to clearly identify that this is similar different to her private episode of pericarditis.  Due to risk of MACE, admission is advised at this time, as well as inability to rule out PE in setting of ARF.  4:08 PM Spoke with Dr. Lindell Noe (Attending Dr. Beryle Beams) of Internal Medicine teaching service, who will admit the patient.  This is a supervised visit with Dr. Charlesetta Shanks. Evaluation, management, and discharge planning discussed with this attending physician.   Final Clinical Impressions(s) / ED Diagnoses   Final diagnoses:  Left sided chest pain  Acute renal failure, unspecified acute renal failure type [facility])    ED Discharge Orders    None       Tamala Julian 09/20/17 1627    Charlesetta Shanks, MD 09/24/17 [phone]

## 2017-09-20 NOTE — ED Notes (Signed)
Called to request to give report to charge nurse. Placed on hold for 3 minutes. Notified ED charge.

## 2017-09-20 NOTE — ED Triage Notes (Signed)
Pt here with chest pain and sob that has been ongoing for two days , no n/v , pain does radiate around to the back

## 2017-09-20 NOTE — H&P (Signed)
[facility] Admission History and Physical Service Pager: [phone]  Patient name: Suzanne Allen Medical record number: [id number] Date of birth: [birth year] Age: 57 y.o. Gender: female  Primary Care Provider: Barrie Lyme, FNP Consultants: none Code Status: FULL  Chief Complaint: chest pain  Assessment and Plan: Orla Estrin is a 57 y.o. female presenting with chest pain, AKI. PMH is significant for T1DM (last a1c 04/2017 in Care Everywhere - 7.1), osteomyelitis s/p R toe amputations, HTN, HLD, chronic pancreatitis, depression.   Chest pain: present for 2 days, worse with inspiration. Radiating around her back. Less likely zoster as no rash and no burning pain. Less likely cardiac as reproducible on exam and trop x 2 negative. HEART score 4. Hx of pericarditis, states it feels unlike this and no rub on exam. Denies recent change in activity suggestive of acute muscle strain, although this seems likely based on exam. Can consider PE, although less likely with reproducible pain on exam. Wells score 0, will not order d dimer as likely falsely elevated in CKD.  -initial trop neg x2; CXR neg; EKG no changes -admit to tele, attending Dr. Ardelia Mems -trend trops -am EKG -consider cardiology consult for possible stress test given risk factors (DM, HTN) -v/Q scan vs empiric anticoagulation if worsens -kpad to area of pain -tylenol PRN -monitor for development of vesicles  AKI on CKD III: patient states baseline Cr is 2.1, last Cr in Care Everywhere is 2-2.9. Denies NSAID use, endorses ACEi as prescribed. No other recent OTC meds or med changes. Decreased PO today. Unclear etiology for AKI, possibly decreased PO on ACEi. S/p 1L NS bolus in ED -100cc NS per hour x 12 hours -daily BMP  T1DM with hx of poor control and osteomyelitis leading to amputations of all toes on R foot: most recent a1c 7.1, question decreased insulin metabolism with CKD. Home doses are lantus 40 BID  and she states 20U humalog with each meal (note states BG <200 12 unit, 201-250 16 unit, 251-300 20 units, >300 24 units -Lantus 30U tonight -moderate sliding scale -repeat a1c -reassess need for BID Lantus in am pending labs   Leukocytosis: appears to be chronic per care everywhere. Unclear etiology.  -monitor on daily CBC  Anemia: baseline Hgb appears between 10-11. Suspect ACD given chronic illnesses. No recent iron studies.  -consider iron studies -daily CBC  HTN:  Home meds are HCTZ, norvac and lisinopril. Hold ACEi and HCTZ in the setting of AKI.  -hold ACEi  -hold HCTZ -hold norvasc and monitor BP as normotensive at present  Depression: sees psych outpatient, on cymbalta and klonipin.  -continue cymbalta -continue klonipin  HFpEF: last echo 2016 with EF 55-60% and normal wall motion. Appears euvolemic to dry on exam. CXR without signs of fluid overload.  -monitor fluid status on exam  Hx of TIA: in 2014 admission. Secondary prevention would include ASA.  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 Recent TSH well controlled.  -continue home levothyroxine  HLD: last lipid profile 2017 with poor control.  -continue crestor 40mg  -repeat lipid profile  FEN/GI: heart healthy carb mod Prophylaxis: lovenox  Disposition: admit to tele  History of Present Illness:  Suzanne Allen is a 57 y.o. female presenting with chest pain.  She mainly came in today because she knows that she cannot take NSAIDs at home for "inflammation" and she has noticed the pain persisting for several days.  She knows that she has kidney problems.  She  states that the pain is a dull type of soreness, feeling like a "bruised rib."  Denies chills or fever.  States the pain is worse with sitting up or moving side to side.  She does recall a history of pericarditis, states that she feels differently from that experience.  Denies sick contacts denies URI symptoms states she  has been taking her ACE inhibitor at home.  Does endorse decreased p.o. intake today.  Additionally, she describes this feeling as having on a bra that is "20 times too tight."Denies rash.  Denies burning sensation over the area.  Reproducible to her palpation.  In the ED, EKG without acute changes, troponin negative x2.  Noted to have AK I.  Status post 1 L normal saline bolus.  Review Of Systems: Per HPI with the following additions: Denies fever, denies abdominal pain, denies dysuria or changes in bowel movements.  ROS  Patient Active Problem List   Diagnosis Date Noted  . AKI (acute kidney injury) ([place]) 07/29/2016  . Cellulitis of left foot 07/23/2016  . Status post transmetatarsal amputation of foot, right ([place]) 07/17/2016  . Major depressive disorder, recurrent episode, mild ([place]) 05/04/2016  . Osteomyelitis ([place]) 07/08/2015  . Poorly controlled diabetes mellitus ([place]) 07/08/2015  . Depression with anxiety 07/08/2015  . Lactic acid acidosis 07/08/2015  . BP (high blood pressure) 06/17/2015  . 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 Hypothyroidism, postop 06/17/2015  . 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 Proliferative diabetic retinopathy ([place]) 06/17/2015  . Dysesthesia 06/17/2015  . CAP (community acquired pneumonia) 03/13/2015  . Dyspnea 03/13/2015  . Respiratory failure with hypoxia ([place]) 03/13/2015  . Pericardial effusion 03/13/2015  . Depression 02/15/2015  . Carpal tunnel syndrome 11/28/2014  . Diabetes, polyneuropathy ([place]) 11/15/2014  . Numbness 11/15/2014  . Gait disturbance 11/15/2014  . Hand weakness 11/15/2014  . Insomnia 11/15/2014  . DDD (degenerative disc disease), lumbar 04/10/2014  . Diarrhea 04/05/2014  . Dyslipidemia 04/04/2014  . 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 Fatty liver 04/04/2014  . Acute pulmonary edema ([place]) 04/04/2014  . Hypokalemia 04/04/2014  . Elevated LFTs 04/04/2014  . Benign essential  HTN 04/02/2014  . Hypothyroidism 04/02/2014  . Diabetes ([place]) 10/10/2013  . Pancreatitis, acute 10/10/2013  . Leukocytosis 10/04/2013  . Acute pancreatitis 10/04/2013  . Diabetic neuropathy ([place]) 10/03/2013  . TIA (transient ischemic attack) 09/23/2012  . Temporary cerebral vascular dysfunction 09/23/2012  . Diabetic osteomyelitis ([place]) 08/29/2012  . Diabetes type 1, uncontrolled ([place]) 08/29/2012  . Type 1 diabetes mellitus ([place]) 08/29/2012  . Type 2 diabetes mellitus ([place]) 08/29/2012  . Diabetic foot ulcer ([place]) 07/27/2012  . Anxiety 08/24/2011  . Arthralgia of multiple joints 10/01/2010  . HLD (hyperlipidemia) 04/25/2010  . Avitaminosis D 04/25/2010  . Allergic rhinitis 08/16/2008    Past Medical History: Past Medical History:  Diagnosis Date  . 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 Anxiety   . CAP (community acquired pneumonia) 03/13/2015  . Carpal tunnel syndrome 11/28/2014   Bilateral  . Chronic pancreatitis ([place])   . Depression   . Diabetes mellitus type I ([place]) dx'd 1977  . Diabetic osteomyelitis ([place])    right great toe/progress notes 09/23/2012  . Diabetic retinopathy ([place])   . Fatty liver 04/04/2014  . HTN (hypertension)   . Hypercholesteremia   . Hypothyroidism   . Macular degeneration, bilateral   . Migraines 1980's   "when I was in my 20's" (03/14/2015)  . Peripheral neuropathy   .  Pneumonia 1993  . PTSD (post-traumatic stress disorder)   . Stroke [facility]) ? date & 09/22/2012  . TIA (transient ischemic attack) 09/22/2012   [name] 10/25/2012    Past Surgical History: Past Surgical History:  Procedure Laterality Date  . AMPUTATION Right 07/12/2015   Procedure: Right Transmetatarsal amputation;  Surgeon: Newt Minion, MD;  Location: [facility];  Service: Orthopedics;  Laterality: Right;  . [place]; 1995  . EYE SURGERY Bilateral    "hemorrhaged behind my eye"  . I&D EXTREMITY Right multiple   "big toe"  . PERIPHERALLY INSERTED  CENTRAL CATHETER INSERTION Right 08/31/2012  . PICC LINE REMOVAL ([place] HX) Right 10/12/2012   [name] 10/12/2012  . REFRACTIVE SURGERY Bilateral 2013  . TOE AMPUTATION Right 11/2012   "big toe"  . TONSILLECTOMY  1965  . TOTAL THYROIDECTOMY  2012  . TUBAL LIGATION  1995    Social History: Social History   Tobacco Use  . Smoking status: Never Smoker  . Smokeless tobacco: Never Used  Substance Use Topics  . Alcohol use: No    Alcohol/week: 0.0 oz  . Drug use: No   Additional social history: none  Please also refer to relevant sections of EMR.  Family History: Family History  Problem Relation Age of Onset  . Depression Mother   . Diabetes Mother   . Alcohol abuse Father   . Alcohol abuse Sister   . Diabetes Maternal Grandmother    Allergies and Medications: Allergies  Allergen Reactions  . Erythromycin Anaphylaxis  . Vancomycin Shortness Of Breath and Other (See Comments)    Red Man Syndrome Has patient had a PCN reaction causing immediate rash, facial/tongue/throat swelling, SOB or lightheadedness with hypotension: unk Has patient had a PCN reaction causing severe rash involving mucus membranes or skin necrosis: unk Has patient had a PCN reaction that required hospitalization: unk Has patient had a PCN reaction occurring within the last 10 years: unk If all of the above answers are "NO", then may proceed with Cephalosporin use.    . Nsaids Other (See Comments)    High creatinine - beginning dialysis   No current facility-administered medications on file prior to encounter.    Current Outpatient Medications on File Prior to Encounter  Medication Sig Dispense Refill  . albuterol (PROVENTIL HFA;VENTOLIN HFA) 108 (90 Base) MCG/ACT inhaler Inhale 2 puffs into the lungs every 4 (four) hours as needed for wheezing.    Marland Kitchen amLODipine (NORVASC) 5 MG tablet Take 5 mg by mouth daily.     . clonazePAM (KLONOPIN) 1 MG tablet Take 1 tablet (1 mg total) by mouth at bedtime. 30 tablet 2   . CRESTOR 40 MG tablet Take 1 tablet by mouth daily  5  . DULoxetine (CYMBALTA) 60 MG capsule Take 1 capsule (60 mg total) by mouth daily. 30 capsule 2  . hydrochlorothiazide (HYDRODIURIL) 25 MG tablet Take 25 mg by mouth daily.    . insulin glargine (LANTUS) 100 UNIT/ML injection Inject 40 Units into the skin 2 (two) times daily.    . insulin lispro (HUMALOG) 100 UNIT/ML injection Inject into the skin See admin instructions. Inject up to 25 units subcutaneously Per sliding scale as directed    . levothyroxine (SYNTHROID, LEVOTHROID) 150 MCG tablet Take 150 mcg by mouth daily before breakfast.    . lisinopril (PRINIVIL,ZESTRIL) 40 MG tablet Take 40 mg by mouth daily.    . rosuvastatin (CRESTOR) 40 MG tablet Take 40 mg by mouth daily.  Objective: BP (!) 108/55   Pulse 88   Temp 98.9 F (37.2 C) (Oral)   Resp 18   SpO2 100%  Exam: General: Overweight female lying in bed in no acute distress. Eyes: Extraocular movements intact, pupils equal round and reactive. ENTM: Dry mucous membranes Neck: Supple normal range of motion Cardiovascular: Regular rate and rhythm, no murmur Respiratory: Clear to auscultation bilaterally, no crackles or wheezes Gastrointestinal: Soft, nontender nondistended, positive bowel sounds MSK: Normal muscle tone and bulk.  Status post amputation of all 5 right toes.  Tenderness to mild palpation over left anterior and posterior chest around rib 8-9 in circumferential distribution.  No overlying bruises or wounds. Derm: No rashes or wounds on visualized skin, no vesicles or rash over area of tenderness. Neuro: A and O x3, cranial nerves grossly intact Psych: Odd affect, normal mood and mentation  Labs and Imaging: CBC BMET  Recent Labs  Lab 09/20/17 1036  WBC 13.0*  HGB 9.5*  HCT 28.3*  PLT 362   Recent Labs  Lab 09/20/17 1036  NA 137  K 4.0  CL 105  CO2 20*  BUN 63*  CREATININE 3.98*  GLUCOSE 267*  CALCIUM 7.3*     Dg Chest 2 View  Result  Date: 09/20/2017 CLINICAL DATA:  Shortness of breath EXAM: CHEST - 2 VIEW COMPARISON:  04/02/2015 FINDINGS: Normal heart size and mediastinal contours. There is no edema, consolidation, effusion, or pneumothorax. No acute osseous finding. IMPRESSION: No evidence of disease. Electronically Signed   By: Monte Fantasia M.D.   On: 09/20/2017 10:50   Trop neg x 2  Sela Hilding, MD 09/20/2017, 3:59 PM PGY-2, [facility] pager: [phone], text pages welcome

## 2017-09-21 ENCOUNTER — Inpatient Hospital Stay (HOSPITAL_COMMUNITY): Payer: Medicaid Other

## 2017-09-21 DIAGNOSIS — I129 Hypertensive chronic kidney disease with stage 1 through stage 4 chronic kidney disease, or unspecified chronic kidney disease: Secondary | ICD-10-CM

## 2017-09-21 DIAGNOSIS — E1022 Type 1 diabetes mellitus with diabetic chronic kidney disease: Secondary | ICD-10-CM

## 2017-09-21 DIAGNOSIS — R071 Chest pain on breathing: Secondary | ICD-10-CM

## 2017-09-21 DIAGNOSIS — R079 Chest pain, unspecified: Secondary | ICD-10-CM

## 2017-09-21 DIAGNOSIS — N179 Acute kidney failure, unspecified: Secondary | ICD-10-CM

## 2017-09-21 DIAGNOSIS — R0781 Pleurodynia: Secondary | ICD-10-CM

## 2017-09-21 LAB — HEMOGLOBIN A1C
Hgb A1c MFr Bld: 9.3 % — ABNORMAL HIGH (ref 4.8–5.6)
Mean Plasma Glucose: 220.21 mg/dL

## 2017-09-21 LAB — GLUCOSE, CAPILLARY
Glucose-Capillary: 141 mg/dL — ABNORMAL HIGH (ref 65–99)
Glucose-Capillary: 129 mg/dL — ABNORMAL HIGH (ref 65–99)
Glucose-Capillary: 261 mg/dL — ABNORMAL HIGH (ref 65–99)
Glucose-Capillary: 273 mg/dL — ABNORMAL HIGH (ref 65–99)

## 2017-09-21 LAB — SAVE SMEAR

## 2017-09-21 LAB — BASIC METABOLIC PANEL
Anion gap: 13 (ref 5–15)
BUN: 63 mg/dL — ABNORMAL HIGH (ref 6–20)
CO2: 17 mmol/L — ABNORMAL LOW (ref 22–32)
Calcium: 7 mg/dL — ABNORMAL LOW (ref 8.9–10.3)
Chloride: 110 mmol/L (ref 101–111)
Creatinine, Ser: 3.84 mg/dL — ABNORMAL HIGH (ref 0.44–1.00)
GFR calc Af Amer: 14 mL/min — ABNORMAL LOW (ref >60)
GFR calc non Af Amer: 12 mL/min — ABNORMAL LOW (ref >60)
Glucose, Bld: 236 mg/dL — ABNORMAL HIGH (ref 65–99)
Potassium: 3.4 mmol/L — ABNORMAL LOW (ref 3.5–5.1)
Sodium: 140 mmol/L (ref 135–145)

## 2017-09-21 LAB — LIPID PANEL
Cholesterol: 183 mg/dL (ref 0–200)
HDL: 27 mg/dL — ABNORMAL LOW (ref >40)
LDL Cholesterol: UNDETERMINED mg/dL (ref 0–99)
Total CHOL/HDL Ratio: 6.8 RATIO
Triglycerides: 541 mg/dL — ABNORMAL HIGH (ref <150)
VLDL: UNDETERMINED mg/dL (ref 0–40)

## 2017-09-21 LAB — CBC
HCT: 26.9 % — ABNORMAL LOW (ref 36.0–46.0)
Hemoglobin: 9 g/dL — ABNORMAL LOW (ref 12.0–15.0)
MCH: 29.9 pg (ref 26.0–34.0)
MCHC: 33.5 g/dL (ref 30.0–36.0)
MCV: 89.4 fL (ref 78.0–100.0)
Platelets: 320 10*3/uL (ref 150–400)
RBC: 3.01 MIL/uL — ABNORMAL LOW (ref 3.87–5.11)
RDW: 13 % (ref 11.5–15.5)
WBC: 11.4 10*3/uL — ABNORMAL HIGH (ref 4.0–10.5)

## 2017-09-21 LAB — HIV ANTIBODY (ROUTINE TESTING W REFLEX): HIV Screen 4th Generation wRfx: NONREACTIVE

## 2017-09-21 LAB — TROPONIN I: Troponin I: <0.03 ng/mL (ref <0.03)

## 2017-09-21 LAB — LIPASE, BLOOD: Lipase: 50 U/L (ref 11–51)

## 2017-09-21 MED ORDER — SODIUM CHLORIDE 0.9 % IV BOLUS (SEPSIS)
1000.0000 mL | Freq: Once | INTRAVENOUS | Status: AC
  Administered 2017-09-21: 1000 mL via INTRAVENOUS

## 2017-09-21 MED ORDER — INSULIN GLARGINE 100 UNIT/ML ~~LOC~~ SOLN
25.0000 [IU] | Freq: Two times a day (BID) | SUBCUTANEOUS | Status: DC
  Administered 2017-09-22 – 2017-09-23 (×3): 25 [IU] via SUBCUTANEOUS
  Filled 2017-09-21 (×4): qty 0.25

## 2017-09-21 MED ORDER — POTASSIUM CHLORIDE CRYS ER 20 MEQ PO TBCR
30.0000 meq | EXTENDED_RELEASE_TABLET | Freq: Once | ORAL | Status: AC
  Administered 2017-09-21: 30 meq via ORAL
  Filled 2017-09-21: qty 1

## 2017-09-21 NOTE — Progress Notes (Addendum)
Inpatient Diabetes Program Recommendations  AACE/ADA: New Consensus Statement on Inpatient Glycemic Control (2015)  Target Ranges:  Prepandial:   less than 140 mg/dL      Peak postprandial:   less than 180 mg/dL (1-2 hours)      Critically ill patients:  140 - 180 mg/dL   Review of Glycemic Control  Diabetes history: DM 1, diagnosed at 57 yo Outpatient Diabetes medications: Lantus 40 units BID, Humalog 20 units tid with meals Current orders for Inpatient glycemic control: Lantus 30 units QHS, Novolog Moderate Correction 0-15 units tid  A1c this admission 9.3%  Inpatient Diabetes Program Recommendations:    Glucose 141 this am after Lantus 30 units last night. Previous admission patient had BID dosing of Lantus (25 units BID) and meal coverage since patient is type 1 and can't cover CHO eaten.  If glucose trends increase, consider increasing frequency of Lantus to BID and Consider adding Novolog meal coverage Novolog 3-5 units tid if patient consumes at least 50% of meals.   Will see patient.  1140 am:  Saw patient to discuss glucose control while here and taking insulin. Patient says she sees Dr. Steffanie Dunn for DM control. Patient reports seeing him in January. A1c went up from 7 to 8. Patient reports having too many lows at a 7 so she backed off on her medication. Discussed current A1c, 9.3% and importance of glucose control. Patient said " they are suppose to do some pancreatic test on me. I haven't ate in 24 hours and when lunch comes, I am going to eat it." Discussed the importance of being NPO. Discussed importance of trying not to delay tests and maybe getting nausea medication instead so she won't "feel sick." Spoke with Alyse Low, Therapist, sports.  Thanks,  Tama Headings RN, MSN, BC-ADM, [facility] Inpatient Diabetes Coordinator Team Pager [phone] (8a-5p)

## 2017-09-21 NOTE — Progress Notes (Signed)
FPTS Interim Progress Note  Noticed that patient received Lantus 30 U at noon today when ordered for once at bedtime. Nurse called and noted that this was an accident and she would ensure that patient did not receive dose tonight. Will monitor CBG's closely.   Sonny Poth, [place], DO 09/21/2017, 2:39 PM PGY-1, [place] [facility] Service pager [phone]

## 2017-09-21 NOTE — Discharge Summary (Signed)
[facility] Discharge Summary  Patient name: Suzanne Allen Medical record number: [id number] Date of birth: [birth year] Age: 57 y.o. Gender: female Date of Admission: 09/20/2017  Date of Discharge: 09/26/17  Admitting Physician: Zenia Resides, MD  Primary Care Provider: Barrie Lyme, FNP Consultants: Cardiology  Indication for Hospitalization: ACS r/o  Discharge Diagnoses/Problem List:  Strained intercostal muscle s/p ACS r/o AKI on CKD III T1DM with osteomyelitis leading to amputations of all toes on R  Chronic HFpEF Leukocytosis Anemia HTN Depression HLD Hypothyroidism Hx of TIA  Disposition: home  Discharge Condition: stable  Discharge Exam:  General: NAD, pleasant ENTM: Moist mucous membranes Cardiovascular: RRR, no m/r/g, no LE edema Respiratory: CTA BL, normal work of breathing Gastrointestinal: soft, nontender, nondistended MSK: moves 4 extremities equally Derm: no rashes appreciated Psych: Appropriate affect  Brief Hospital Course:  This 57 yo female presented with chest pain and rib pain and was admitted for an ACS rule out. Patient had pain for 2 days, worse with inspiration. Radiating around her back, and no signs of  zoster as no rash. Also noted to have an AKI on admission. Cardiology was consulted and follow along. Troponins were trended and remained negative. EKG shows only minimal nonspecific T wave abnormality which was new compared to prior EKG but she was also mildly hypokalemic. An ECHO was obtained which showed normal LVH and Grade 1 diastolic dysfunction. Cardiology signed off as patient's pain was atypical and work up was negative.  Patient was also noted to have a significant AKI with Creatinine to 3.98 and GFR <15. Patient states baseline Cr is 2.1, last Cr in Care Everywhere is 2-2.9. There was mild improvement after IV fluid resuscitation and patient's Cr improved to 3.33 prior to discharge with improvement over the  courase of the hospitalization. Patient is follow by nephrology in [place] and was encouraged to schedule an appointment as soon as she could to follow up regarding her worsening kidney function. Given her GFR <15, patient was started on a phosphorus binder, Renvela 800mg  TID (Levothyroxine not to be taken at the same time). Patient had a normal renal US. Patient with FENa of 4.7% showing possible bladder outlet, but patient with history of CKD and this may not be clinically accurate, but a Post-void residual performed looking for neurogenic bladder was normal with 49mL remaining.   Patient with history of Type 1 DM followed by an endocrinologist was sent home with previous regimen. While admitted, she was on Lantus 25 U BID with moderate sliding scale and was controlled pretty well. Patient also noted to be mildly anemic and was started on iron supplementation to be taken every other day.  Prior to discharge, patient's pain had improved to a dull pain and patient was asking to be discharged to be able to care for her daughter. Patient was sent home with increased Norvasc to 10mg  daily for HTN, as her NCTZ and Lisinopril were still held for AKI during hospitalization. She was instructed to follow up with her nephrologist and PCP for BP check.   Issues for Follow Up:  1. Patient will need outpatient nuclear stress test given her cardiac risk factors with Dr. Burt Knack. 2. Consider stopping patient's Klonopin. 3. Stopped patient's lisinopril and HCTZ given severity of AKI. Increased Norvasc for BP, will need re-check of BMP and BP. Patient may not be benefiting from HCTZ alone without Loop diuretic in setting of GFR <30.  4. Started patient on Renvela given GFR <15 on  admission for phosphate binding and given iron supplementation every other day for anemia.   Significant Procedures: none  Significant Labs and Imaging:  Recent Labs  Lab 09/21/17 0407 09/22/17 0429 09/23/17 0554  WBC 11.4* 11.1* 11.4*   HGB 9.0* 8.4* 8.3*  HCT 26.9* 24.6* 24.7*  PLT 320 296 281   Recent Labs  Lab 09/20/17 1036 09/21/17 0407 09/22/17 0429 09/22/17 1426 09/23/17 0554  NA 137 140 138 138 140  K 4.0 3.4* 3.8 3.9 3.5  CL 105 110 111 110 111  CO2 20* 17* 19* 20* 20*  GLUCOSE 267* 236* 177* 260* 107*  BUN 63* 63* 49* 45* 42*  CREATININE 3.98* 3.84* 3.46* 3.52* 3.33*  CALCIUM 7.3* 7.0* 7.2* 7.3* 7.5*  A1c 9.3  ECHO: Study Conclusions  - Left ventricle: The cavity size was normal. Wall thickness was increased in a pattern of moderate LVH. Systolic function wasnormal. The estimated ejection fraction was in the range of 60% to 65%. Wall motion was normal; there were no regional wall motion abnormalities. Doppler parameters are consistent with abnormal left ventricular relaxation (grade 1 diastolic dysfunction). The E/e&' ratio is between 8-15, suggesting indeterminate LV filling pressure. - Mitral valve: Calcified annulus. Mildly thickened leaflets. There was trivial regurgitation. - Left atrium: The atrium was normal in size. - Inferior vena cava: The vessel was normal in size. The respirophasic diameter changes were in the normal range (>= 50%), consistent with normal central venous pressure.  US Renal Result Date: 09/21/2017 CLINICAL DATA:  Acute kidney injury. EXAM: RENAL / URINARY TRACT ULTRASOUND COMPLETE COMPARISON:  CT abdomen and pelvis 04/09/2014. FINDINGS: Right Kidney: Length: 12.1 cm. Echogenicity within normal limits. No mass or hydronephrosis visualized. Left Kidney: Length: 12.1 cm. Echogenicity within normal limits. No mass or hydronephrosis visualized. Bladder: Appears normal for degree of bladder distention. IMPRESSION: Negative for hydronephrosis.  Negative exam. Electronically Signed   By: Inge Rise M.D.   On: 09/21/2017 14:54   Dg Chest 2 View Result Date: 09/20/2017 CLINICAL DATA:  Shortness of breath EXAM: CHEST - 2 VIEW COMPARISON:  04/02/2015 FINDINGS: Normal heart size and  mediastinal contours. There is no edema, consolidation, effusion, or pneumothorax. No acute osseous finding. IMPRESSION: No evidence of disease. Electronically Signed   By: Monte Fantasia M.D.   On: 09/20/2017 10:50   Results/Tests Pending at Time of Discharge: none  Discharge Medications:  Allergies as of 09/23/2017      Reactions   Erythromycin Anaphylaxis   Vancomycin Shortness Of Breath, Other (See Comments)   Red Man Syndrome Has patient had a PCN reaction causing immediate rash, facial/tongue/throat swelling, SOB or lightheadedness with hypotension: unk Has patient had a PCN reaction causing severe rash involving mucus membranes or skin necrosis: unk Has patient had a PCN reaction that required hospitalization: unk Has patient had a PCN reaction occurring within the last 10 years: unk If all of the above answers are "NO", then may proceed with Cephalosporin use.   Nsaids Other (See Comments)   Avoid due to CKD      Medication List    STOP taking these medications   hydrochlorothiazide 25 MG tablet Commonly known as:  HYDRODIURIL   lisinopril 40 MG tablet Commonly known as:  PRINIVIL,ZESTRIL     TAKE these medications   albuterol 108 (90 Base) MCG/ACT inhaler Commonly known as:  PROVENTIL HFA;VENTOLIN HFA Inhale 2 puffs into the lungs every 4 (four) hours as needed for wheezing.   amLODipine 10 MG tablet Commonly known  as:  NORVASC Take 1 tablet (10 mg total) by mouth daily. What changed:    medication strength  how much to take   aspirin 81 MG EC tablet Take 1 tablet (81 mg total) by mouth daily.   clonazePAM 1 MG tablet Commonly known as:  KLONOPIN Take 1 tablet (1 mg total) by mouth at bedtime.   DULoxetine 60 MG capsule Commonly known as:  CYMBALTA Take 1 capsule (60 mg total) by mouth daily.   ferrous sulfate 325 (65 FE) MG tablet Take 1 tablet (325 mg total) by mouth every other day.   insulin glargine 100 UNIT/ML injection Commonly known as:   LANTUS Inject 40 Units into the skin 2 (two) times daily.   insulin lispro 100 UNIT/ML injection Commonly known as:  HUMALOG Inject into the skin See admin instructions. Inject up to 25 units subcutaneously Per sliding scale as directed   levothyroxine 150 MCG tablet Commonly known as:  SYNTHROID, LEVOTHROID Take 150 mcg by mouth daily before breakfast.   rosuvastatin 40 MG tablet Commonly known as:  CRESTOR Take 40 mg by mouth daily. What changed:  Another medication with the same name was removed. Continue taking this medication, and follow the directions you see here.   sevelamer carbonate 800 MG tablet Commonly known as:  RENVELA Take 1 tablet (800 mg total) by mouth 3 (three) times daily with meals. Avoid taking with levothyroxine       Discharge Instructions: Please refer to Patient Instructions section of EMR for full details.  Patient was counseled important signs and symptoms that should prompt return to medical care, changes in medications, dietary instructions, activity restrictions, and follow up appointments.   Follow-Up Appointments:   Allia Wiltsey, [place], DO 09/26/2017, 12:42 PM PGY-1, [place]

## 2017-09-21 NOTE — Progress Notes (Addendum)
Family Medicine Teaching Service Daily Progress Note Intern Pager: [phone]  Patient name: Suzanne Allen Medical record number: [id number] Date of birth: 07-14-1960 Age: 57 y.o. Gender: female  Primary Care Provider: Barrie Lyme, FNP Consultants: Cardiology Code Status: Full  Pt Overview and Major Events to Date:  Suzanne Allen is a 57 y.o. female presenting with chest pain, AKI. PMH is significant for T1DM (last a1c 04/2017 in Care Everywhere - 7.1), osteomyelitis s/p R toe amputations, HTN, HLD, chronic pancreatitis, depression.   Assessment and Plan: Chest pain: Less likely cardiac as reproducible on exam and trop x 2 negative. HEART score 4. Hx of pericarditis, states it feels unlike this and no rub on exam. Wells score 0. Previous hx of pancreatitis but this feels differently. - will obtain lipase to r/o pancreatitis  - initial trop neg x2; CXR neg; EKG no changes - am EKG with no changes - consider cardiology consult for possible stress test given risk factors (DM, HTN) - v/Q scan vs empiric anticoagulation if worsens - kpad to area of pain - tylenol PRN - monitor for development of vesicles  AKI on CKD III: patient states baseline Cr is 2.1, last Cr in Care Everywhere is 2-2.9. Denies NSAID use, endorses ACEi as prescribed. No other recent OTC meds or med changes. Decreased PO today. Unclear etiology for AKI, possibly decreased PO on ACEi. S/p 1L NS bolus in ED -s/p 100cc NS per hour x 12 hours - daily BMP - Obtain Renal US, FeNa  T1DM with hx of poor control and osteomyelitis leading to amputations of all toes on R foot: most recent a1c 7.1, question decreased insulin metabolism with CKD. Home doses are lantus 40 BID and she states 20U humalog with each meal (note states BG <200 12 unit, 201-250 16 unit, 251-300 20 units, >300 24 units - Lantus 25U BID, given 30 U overnight  - moderate sliding scale - repeat a1c 9.3  Leukocytosis: appears to be chronic per care everywhere.  Unclear etiology.  - monitor on daily CBC  Anemia: baseline Hgb appears between 10-11. Suspect ACD given chronic illnesses. No recent iron studies.  - consider iron studies - daily CBC  HTN:  Home meds are HCTZ, norvac and lisinopril. Hold ACEi and HCTZ in the setting of AKI.  - hold ACEi  - hold HCTZ - hold norvasc and monitor BP as normotensive at present  Depression: sees psych outpatient, on cymbalta and klonipin.  - continue cymbalta - continue klonipin  HFpEF: last echo 2016 with EF 55-60% and normal wall motion. Appears euvolemic to dry on exam. CXR without signs of fluid overload.  - monitor fluid status on exam - repeat ECHO per cardiology  Hx of TIA: in 2014 admission. Secondary prevention would include ASA.  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 Recent TSH well controlled.  - continue home levothyroxine  HLD: last lipid profile 2017 with poor control.  - continue crestor 40mg  - Tot chol 183, HDL 27, LDL unable to calc, TG 541  FEN/GI: Heart healthy/ carb modified PPx: Lovenox  Disposition: Continued inpatient level of care  Subjective:  Patient has had no relief from pain overnight but was able to sleep.   Objective: Temp:  [97.8 F (36.6 C)-98.9 F (37.2 C)] 97.8 F (36.6 C) (03/12 0623) Pulse Rate:  [81-96] 81 (03/12 0623) Resp:  [16-24] 20 (03/12 0623) BP: (108-159)/(55-94) 144/73 (03/12 0623) SpO2:  [98 %-100 %] 98 % (03/12 0623) Weight:  [  172 lb 12.8 oz (78.4 kg)-173 lb 9.6 oz (78.7 kg)] 172 lb 12.8 oz (78.4 kg) (03/12 4034) Physical Exam: General: NAD, pleasant Eyes: PERRL, EOMI, no conjunctival pallor or injection ENTM: Moist mucous membranes Cardiovascular: RRR, no m/r/g, no LE edema Respiratory: CTA BL, normal work of breathing Gastrointestinal: soft, nontender, nondistended, normoactive BS MSK: moves 4 extremities equally, tender along L rib cage with no noted rashes Derm: no rashes  appreciated Neuro: CN II-XII grossly intact Psych: AOx3, appropriate affect  Laboratory: Recent Labs  Lab 09/20/17 1036 09/21/17 0407  WBC 13.0* 11.4*  HGB 9.5* 9.0*  HCT 28.3* 26.9*  PLT 362 320   Recent Labs  Lab 09/20/17 1036 09/21/17 0407  NA 137 140  K 4.0 3.4*  CL 105 110  CO2 20* 17*  BUN 63* 63*  CREATININE 3.98* 3.84*  CALCIUM 7.3* 7.0*  GLUCOSE 267* 236*    Imaging/Diagnostic Tests: Dg Chest 2 View  Result Date: 09/20/2017 CLINICAL DATA:  Shortness of breath EXAM: CHEST - 2 VIEW COMPARISON:  04/02/2015 FINDINGS: Normal heart size and mediastinal contours. There is no edema, consolidation, effusion, or pneumothorax. No acute osseous finding. IMPRESSION: No evidence of disease. Electronically Signed   By: Monte Fantasia M.D.   On: 09/20/2017 10:50    Hughes Wyndham, [place], DO 09/21/2017, 7:24 AM PGY-1, [facility] Intern pager: [phone], text pages welcome

## 2017-09-21 NOTE — Plan of Care (Signed)
  Activity: Risk for activity intolerance will decrease 09/21/2017 2308 - Completed/Met by Theora Gianotti, RN   Pt up ad lib. Ambulates independently with steady gait. Verbalizes understanding of need to call for assistance prior to ambulation when necessary.

## 2017-09-21 NOTE — Consult Note (Addendum)
Cardiology Consultation:   Patient ID: Suzanne Allen; [id number]; 05/19/1961   Admit date: 09/20/2017 Date of Consult: 09/21/2017  Primary Care Provider: Barrie Lyme, FNP Primary Cardiologist: Dr Burt Knack  Patient Profile:   Suzanne Allen is a 57 y.o. female with a hx of T1DM, diabetic osteomyelitis s/p right toe amputations, HTN, HLD, CKD stage III, hypothyroidism, TIA, PTSD, 02/2015 peric and pleural effusions (rx w/ Lasix, colchicine and resolved), who is being seen today for the evaluation of chest pain and SOB that began two days ago at the request of Dr. Chrisandra Netters, MD .  History of Present Illness:   Suzanne Allen states that the chest pain began two days ago and has been gradually getting worse. Patient reports that the pain is worse with deep inspiration, moving or sitting upright and radiates underneath the left breast to the back. Pain denies any history of chest pain or a similar feeling before. States she recalls a history of pericarditis two years ago, but that this pain is not the same.   Patient denies any associated N/V, diaphoresis, numbness/tingling or abdominal pain. Suzanne Allen does endorse feeling chilled the past two days, but denies any recorded fevers, sick contacts or URI symptoms.   She denies any personal history of DVT or PE, recent surgery/immobilizaiton/hospitalizaion, estrogen use or hemoptysis  Suzanne Allen describes that pain as a deep soreness, like a "bruised rib," but denies any trauma or muscle strain to her chest wall. Denies any visible rash, brusing, or burning sensation. Reports that the pain in reproducible upon palpation. She states that she tried using tylenol for pain PTA without any relief and did not use any NSAIDs due to her poor kidney function. Her PCP recommended her to come to the hospital for pain control and evaluation.   .  Past Medical History:  Diagnosis Date  . 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 Anxiety   . CAP  (community acquired pneumonia) 03/13/2015  . Carpal tunnel syndrome 11/28/2014   Bilateral  . Chronic pancreatitis ([place])   . Depression   . Diabetes mellitus type I ([place]) dx'd 1977  . Diabetic osteomyelitis ([place])    right great toe/progress notes 09/23/2012  . Diabetic retinopathy ([place])   . Fatty liver 04/04/2014  . HTN (hypertension)   . Hypercholesteremia   . Hypothyroidism   . Macular degeneration, bilateral   . Migraines 1980's   "when I was in my 20's" (03/14/2015)  . Peripheral neuropathy   . Pneumonia 1993  . PTSD (post-traumatic stress disorder)   . Stroke [facility]) ? date & 09/22/2012  . TIA (transient ischemic attack) 09/22/2012   [name] 10/25/2012    Past Surgical History:  Procedure Laterality Date  . AMPUTATION Right 07/12/2015   Procedure: Right Transmetatarsal amputation;  Surgeon: Newt Minion, MD;  Location: [facility];  Service: Orthopedics;  Laterality: Right;  . [place]; 1995  . EYE SURGERY Bilateral    "hemorrhaged behind my eye"  . I&D EXTREMITY Right multiple   "big toe"  . PERIPHERALLY INSERTED CENTRAL CATHETER INSERTION Right 08/31/2012  . PICC LINE REMOVAL ([place] HX) Right 10/12/2012   [name] 10/12/2012  . REFRACTIVE SURGERY Bilateral 2013  . TOE AMPUTATION Right 11/2012   "big toe"  . TONSILLECTOMY  1965  . TOTAL THYROIDECTOMY  2012  . TUBAL LIGATION  1995     Prior to Admission medications   Medication Sig Start Date End Date Taking? Authorizing Provider  albuterol (PROVENTIL HFA;VENTOLIN  HFA) 108 (90 Base) MCG/ACT inhaler Inhale 2 puffs into the lungs every 4 (four) hours as needed for wheezing. 11/13/14  Yes [provider]  amLODipine (NORVASC) 5 MG tablet Take 5 mg by mouth daily.  01/03/16  Yes [provider]  clonazePAM (KLONOPIN) 1 MG tablet Take 1 tablet (1 mg total) by mouth at bedtime. 08/05/17  Yes Arfeen, Arlyce Harman, MD  CRESTOR 40 MG tablet Take 1 tablet by mouth daily 11/06/14  Yes [provider]  DULoxetine  (CYMBALTA) 60 MG capsule Take 1 capsule (60 mg total) by mouth daily. 08/05/17  Yes Arfeen, Arlyce Harman, MD  hydrochlorothiazide (HYDRODIURIL) 25 MG tablet Take 25 mg by mouth daily.   Yes [provider]  insulin glargine (LANTUS) 100 UNIT/ML injection Inject 40 Units into the skin 2 (two) times daily.   Yes [provider]  insulin lispro (HUMALOG) 100 UNIT/ML injection Inject into the skin See admin instructions. Inject up to 25 units subcutaneously Per sliding scale as directed   Yes [provider]  levothyroxine (SYNTHROID, LEVOTHROID) 150 MCG tablet Take 150 mcg by mouth daily before breakfast.   Yes [provider]  lisinopril (PRINIVIL,ZESTRIL) 40 MG tablet Take 40 mg by mouth daily.   Yes [provider]  rosuvastatin (CRESTOR) 40 MG tablet Take 40 mg by mouth daily.   Yes [provider]    Inpatient Medications: Scheduled Meds: . amLODipine  5 mg Oral Daily  . aspirin EC  81 mg Oral Daily  . clonazePAM  1 mg Oral QHS  . DULoxetine  60 mg Oral Daily  . enoxaparin (LOVENOX) injection  30 mg Subcutaneous Q24H  . insulin aspart  0-15 Units Subcutaneous TID WC  . insulin glargine  30 Units Subcutaneous QHS  . levothyroxine  150 mcg Oral QAC breakfast  . potassium chloride  30 mEq Oral Once  . rosuvastatin  40 mg Oral Daily   Continuous Infusions:  PRN Meds: acetaminophen **OR** acetaminophen, albuterol, cyclobenzaprine  Allergies:    Allergies  Allergen Reactions  . Erythromycin Anaphylaxis  . Vancomycin Shortness Of Breath and Other (See Comments)    Red Man Syndrome Has patient had a PCN reaction causing immediate rash, facial/tongue/throat swelling, SOB or lightheadedness with hypotension: unk Has patient had a PCN reaction causing severe rash involving mucus membranes or skin necrosis: unk Has patient had a PCN reaction that required hospitalization: unk Has patient had a PCN reaction occurring within the last 10 years:  unk If all of the above answers are "NO", then may proceed with Cephalosporin use.    . Nsaids Other (See Comments)    Avoid due to CKD    Social History:   Social History   Socioeconomic History  . Marital status: Widowed    Spouse name: Not on file  . Number of children: Not on file  . Years of education: Not on file  . Highest education level: Not on file  Social Needs  . Financial resource strain: Not on file  . Food insecurity - worry: Not on file  . Food insecurity - inability: Not on file  . Transportation needs - medical: Not on file  . Transportation needs - non-medical: Not on file  Occupational History  . Not on file  Tobacco Use  . Smoking status: Never Smoker  . Smokeless tobacco: Never Used  Substance and Sexual Activity  . Alcohol use: No    Alcohol/week: 0.0 oz  . Drug use: No  .  Sexual activity: Not Currently  Other Topics Concern  . Not on file  Social History Narrative   Lives in [place] with daughter and father. Completely independent in ADLs, IADLs.    Family History:   Family History  Problem Relation Age of Onset  . Depression Mother   . Diabetes Mother   . Alcohol abuse Father   . Alcohol abuse Sister   . Diabetes Maternal Grandmother    Family Status:  Family Status  Relation Name Status  . Mother  Deceased  . Father  Alive  . Sister  Alive  . MGM  (Not Specified)    ROS:  Please see the history of present illness.  All other ROS reviewed and negative.     Physical Exam/Data:   Vitals:   09/20/17 1830 09/20/17 1900 09/20/17 2040 09/21/17 0623  BP: 113/72 (!) 159/92 130/67 (!) 144/73  Pulse: 84 89 88 81  Resp: 16 18 18 20   Temp:   98.5 F (36.9 C) 97.8 F (36.6 C)  TempSrc:   Oral Oral  SpO2: 99% 100% 100% 98%  Weight:   173 lb 9.6 oz (78.7 kg) 172 lb 12.8 oz (78.4 kg)  Height:   5\' 2"  (1.575 m)     Intake/Output Summary (Last 24 hours) at 09/21/2017 0802 Last data filed at 09/21/2017 2876 Gross per 24 hour    Intake 2705 ml  Output 300 ml  Net 2405 ml   Filed Weights   09/20/17 2040 09/21/17 0623  Weight: 173 lb 9.6 oz (78.7 kg) 172 lb 12.8 oz (78.4 kg)   Body mass index is 31.61 kg/m.  General:  Overweight female lying in bed in no acute distress  HEENT: normal Lymph: no adenopathy Neck: no JVD, supple, normal ROM Endocrine:  No thryomegaly Vascular: No carotid bruits; 4/4 extremity pulses 2+, without bruits  Cardiac:  normal S1, S2; RRR; no murmur, pericardial friction rubs or gallops Lungs:  clear to auscultation bilaterally, no wheezing, rhonchi or rales  Abd: soft, nontender, no hepatomegaly  Ext: no edema Musculoskeletal: Normal muscle tone and bulk.  Status post amputation of all 5 right toes.  Tenderness to mild palpation over left anterior and posterior chest around rib 8-9 in circumferential distribution. No overlying bruises or wounds. Skin: warm and dry  Neuro:  CNs 2-12 intact, no focal abnormalities noted Psych:  Odd affect, normal mood and mentation   EKG:  The EKG was personally reviewed and demonstrates:  HR 82, NSR with non-specific T wave changes  Telemetry:  Telemetry was personally reviewed and demonstrates:  HR 80's, NSR, with one episode of paired PVCs  Relevant CV Studies:  ECHO: last echo 03/29/2015 - EF 55-60% with normal wall motion - Left ventricle: The cavity size was normal. Systolic function was   normal. The estimated ejection fraction was in the range of 55%   to 60%. Wall motion was normal; there were no regional wall   motion abnormalities. Left ventricular diastolic function   parameters were normal. - Atrial septum: No defect or patent foramen ovale was identified.   CATH: no record of any cath procedures completed   Laboratory Data:  Chemistry Recent Labs  Lab 09/20/17 1036 09/21/17 0407  NA 137 140  K 4.0 3.4*  CL 105 110  CO2 20* 17*  GLUCOSE 267* 236*  BUN 63* 63*  CREATININE 3.98* 3.84*  CALCIUM 7.3* 7.0*  GFRNONAA 12* 12*   GFRAA 14* 14*  ANIONGAP 12 13  Lab Results  Component Value Date   ALT 27 07/23/2016   AST 20 07/23/2016   ALKPHOS 89 07/23/2016   BILITOT 0.7 07/23/2016   Hematology Recent Labs  Lab 09/20/17 1036 09/21/17 0407  WBC 13.0* 11.4*  RBC 3.18* 3.01*  HGB 9.5* 9.0*  HCT 28.3* 26.9*  MCV 89.0 89.4  MCH 29.9 29.9  MCHC 33.6 33.5  RDW 13.0 13.0  PLT 362 320   Cardiac EnzymesNo results for input(s): TROPONINI in the last 168 hours.  Recent Labs  Lab 09/20/17 1102 09/20/17 1527  TROPIPOC 0.00 0.00    Lipids: Lab Results  Component Value Date   CHOL 183 09/21/2017   HDL 27 (L) 09/21/2017   LDLCALC UNABLE TO CALCULATE IF TRIGLYCERIDE OVER 400 mg/dL 09/21/2017   TRIG 541 (H) 09/21/2017   CHOLHDL 6.8 09/21/2017   HgbA1c: Lab Results  Component Value Date   HGBA1C 9.3 (H) 09/21/2017   Magnesium:  Magnesium  Date Value Ref Range Status  10/06/2013 1.6 1.5 - 2.5 mg/dL Final     Radiology/Studies:  Dg Chest 2 View  Result Date: 09/20/2017 CLINICAL DATA:  Shortness of breath EXAM: CHEST - 2 VIEW COMPARISON:  04/02/2015 FINDINGS: Normal heart size and mediastinal contours. There is no edema, consolidation, effusion, or pneumothorax. No acute osseous finding. IMPRESSION: No evidence of disease. Electronically Signed   By: Monte Fantasia M.D.   On: 09/20/2017 10:50    Assessment and Plan:   1. Chest Pain  - present for 2 days, worse on inspiration, radiating to back on left side/does not cross midline, no rash - history of pericardial effusion/pericarditis, no friction rub on exam  - less likely cardiac in nature since pain in reproducible on exam, troponins negative x 2 - CXR normal, no EKG changes, NSR with HR 80's - Continue to trend troponin, repeat EKG tomorrow AM - Tylenol PRN - ck echo, no further eval unless echo significantly abnl.  Otherwise, per IM 2. HTN 3. Type I DM  4. Hyperlipidemia  5. History of TIA 6. AKI on CKD Stage III 7. Anemia 8.  Leukocytosis   For questions or updates, please contact [facility] Please consult [email or web address] for contact info under Cardiology/STEMI.   Signed, Katina Degree, Student-PA  09/21/2017 8:02 AM   Patient seen and independently examined with Rosaria Ferries, PA. We discussed all aspects of the encounter. I agree with the assessment and plan as stated above. This is a 57 year old female with a history of type 1 diabetes mellitus, hypertension, hyperlipidemia, chronic kidney disease stage III, hypothyroidism, history of multiple episodes of pancreatitis and a history of pericardial and pleural effusions in 2016.  She started having chest pain about 2 days ago that was worse with inspiration and much worse with moving or sitting upright.  If she would lay down to be quiet it would resolve.  It is mainly under her left breast and left upper quadrant.  It is much worse with palpation of her left upper quadrant.She has not had any associated nausea/vomiting or diaphoresis.  The discomfort is described as a deep soreness like she has bruised inside.  GEN: Well nourished, well developed in no acute distress HEENT: Normal NECK: No JVD; No carotid bruits LYMPHATICS: No lymphadenopathy CARDIAC:RRR, no murmurs, rubs, gallops RESPIRATORY:  Clear to auscultation without rales, wheezing or rhonchi  ABDOMEN: Soft, non-distended, She is markedly tender to palpation in her left upper quadrant.  Her pain is very atypical and is very reproducible with palpation  of her left upper quadrant MUSCULOSKELETAL:  No edema; No deformity  SKIN: Warm and dry NEUROLOGIC:  Alert and oriented x 3 PSYCHIATRIC:  Normal affect   Her pain is very reproducible with palpation of her left upper quadrant and epigastric area concerning for possible pancreatitis.  It is worse with movement as well as deep inspiration and resolves when she lies supine and therefore is not consistent with pericarditis.  She says it is not like what  she had with her pericardial pain in the past.  It does not sound ischemic in origin as it is completely reproducible with palpation.  Troponin is 0 x 3 and EKG shows only minimal nonspecific T wave abnormality which is new compared to prior EKG but she is also mildly hypokalemic.  She is currently in acute renal failure with a creatinine 3.84 which is new for her and possibly related to dehydration from decreased p.o. intake.  At this time I do not think her pain is related to coronary ischemia.  She is currently in the process of being worked up for acute pancreatitis.  We will get a 2D echocardiogram to make sure her LV function is normal and that there is no pericardial effusion.  If 2D echocardiogram is normal then no further inpatient workup is necessary.  Would benefit from outpatient nuclear stress test given her cardiac risk factors.   Signed: Fransico Him, MD [facility] 09/21/2017

## 2017-09-21 NOTE — Plan of Care (Signed)
  Education: Knowledge of General Education information will improve 09/21/2017 0053 - Completed/Met by Claudine Mouton, RN  Pt. Oriented to unit, plan of care and method of reporting concerns, call light is at the bedside table within reach.

## 2017-09-22 ENCOUNTER — Inpatient Hospital Stay (HOSPITAL_COMMUNITY): Payer: Self-pay

## 2017-09-22 ENCOUNTER — Inpatient Hospital Stay (HOSPITAL_COMMUNITY): Payer: Medicaid Other

## 2017-09-22 DIAGNOSIS — R079 Chest pain, unspecified: Secondary | ICD-10-CM

## 2017-09-22 LAB — BASIC METABOLIC PANEL
Anion gap: 8 (ref 5–15)
Anion gap: 8 (ref 5–15)
BUN: 49 mg/dL — ABNORMAL HIGH (ref 6–20)
BUN: 45 mg/dL — ABNORMAL HIGH (ref 6–20)
CO2: 19 mmol/L — ABNORMAL LOW (ref 22–32)
CO2: 20 mmol/L — ABNORMAL LOW (ref 22–32)
Calcium: 7.2 mg/dL — ABNORMAL LOW (ref 8.9–10.3)
Calcium: 7.3 mg/dL — ABNORMAL LOW (ref 8.9–10.3)
Chloride: 111 mmol/L (ref 101–111)
Chloride: 110 mmol/L (ref 101–111)
Creatinine, Ser: 3.46 mg/dL — ABNORMAL HIGH (ref 0.44–1.00)
Creatinine, Ser: 3.52 mg/dL — ABNORMAL HIGH (ref 0.44–1.00)
GFR calc Af Amer: 16 mL/min — ABNORMAL LOW (ref >60)
GFR calc Af Amer: 16 mL/min — ABNORMAL LOW (ref >60)
GFR calc non Af Amer: 14 mL/min — ABNORMAL LOW (ref >60)
GFR calc non Af Amer: 14 mL/min — ABNORMAL LOW (ref >60)
Glucose, Bld: 177 mg/dL — ABNORMAL HIGH (ref 65–99)
Glucose, Bld: 260 mg/dL — ABNORMAL HIGH (ref 65–99)
Potassium: 3.8 mmol/L (ref 3.5–5.1)
Potassium: 3.9 mmol/L (ref 3.5–5.1)
Sodium: 138 mmol/L (ref 135–145)
Sodium: 138 mmol/L (ref 135–145)

## 2017-09-22 LAB — CBC
HCT: 24.6 % — ABNORMAL LOW (ref 36.0–46.0)
Hemoglobin: 8.4 g/dL — ABNORMAL LOW (ref 12.0–15.0)
MCH: 30.5 pg (ref 26.0–34.0)
MCHC: 34.1 g/dL (ref 30.0–36.0)
MCV: 89.5 fL (ref 78.0–100.0)
Platelets: 296 10*3/uL (ref 150–400)
RBC: 2.75 MIL/uL — ABNORMAL LOW (ref 3.87–5.11)
RDW: 13.2 % (ref 11.5–15.5)
WBC: 11.1 10*3/uL — ABNORMAL HIGH (ref 4.0–10.5)

## 2017-09-22 LAB — URINALYSIS, ROUTINE W REFLEX MICROSCOPIC
Bilirubin Urine: NEGATIVE
Glucose, UA: >=500 mg/dL — AB
Hgb urine dipstick: NEGATIVE
Ketones, ur: NEGATIVE mg/dL
Leukocytes, UA: NEGATIVE
Nitrite: NEGATIVE
Protein, ur: >=300 mg/dL — AB
Specific Gravity, Urine: 1.01 (ref 1.005–1.030)
pH: 6 (ref 5.0–8.0)

## 2017-09-22 LAB — ECHOCARDIOGRAM COMPLETE
Height: 62 in
Weight: 2764.8 oz

## 2017-09-22 LAB — GLUCOSE, CAPILLARY
Glucose-Capillary: 179 mg/dL — ABNORMAL HIGH (ref 65–99)
Glucose-Capillary: 192 mg/dL — ABNORMAL HIGH (ref 65–99)
Glucose-Capillary: 226 mg/dL — ABNORMAL HIGH (ref 65–99)
Glucose-Capillary: 228 mg/dL — ABNORMAL HIGH (ref 65–99)

## 2017-09-22 LAB — SODIUM, URINE, RANDOM: Sodium, Ur: 91 mmol/L

## 2017-09-22 LAB — CREATININE, URINE, RANDOM: Creatinine, Urine: 45.63 mg/dL

## 2017-09-22 MED ORDER — AMLODIPINE BESYLATE 10 MG PO TABS
10.0000 mg | ORAL_TABLET | Freq: Every day | ORAL | Status: DC
  Administered 2017-09-23: 10 mg via ORAL
  Filled 2017-09-22: qty 1

## 2017-09-22 MED ORDER — MORPHINE SULFATE (PF) 2 MG/ML IV SOLN
1.0000 mg | Freq: Once | INTRAVENOUS | Status: AC
  Administered 2017-09-22: 1 mg via INTRAVENOUS
  Filled 2017-09-22: qty 1

## 2017-09-22 MED ORDER — FERROUS SULFATE 325 (65 FE) MG PO TABS
325.0000 mg | ORAL_TABLET | ORAL | Status: DC
  Administered 2017-09-22: 325 mg via ORAL
  Filled 2017-09-22 (×2): qty 1

## 2017-09-22 MED ORDER — LIDOCAINE 5 % EX PTCH
1.0000 | MEDICATED_PATCH | CUTANEOUS | Status: DC
  Administered 2017-09-22: 1 via TRANSDERMAL
  Filled 2017-09-22 (×2): qty 1

## 2017-09-22 MED ORDER — SEVELAMER CARBONATE 800 MG PO TABS
800.0000 mg | ORAL_TABLET | Freq: Three times a day (TID) | ORAL | Status: DC
  Administered 2017-09-22 – 2017-09-23 (×3): 800 mg via ORAL
  Filled 2017-09-22 (×3): qty 1

## 2017-09-22 NOTE — Progress Notes (Signed)
Family Medicine Teaching Service Daily Progress Note Intern Pager: [phone]  Patient name: Suzanne Allen Medical record number: [id number] Date of birth: April 11, 1961 Age: 57 y.o. Gender: female  Primary Care Provider: Barrie Lyme, FNP Consultants: Cardiology Code Status: Full  Pt Overview and Major Events to Date:  Suzanne Allen is a 57 y.o. female presenting with chest pain, AKI. PMH is significant for T1DM, osteomyelitis s/p R toe amputations, HTN, HLD, chronic pancreatitis, depression.   Assessment and Plan: Chest pain: Less likely cardiac as reproducible on exam and trop x 2 negative. HEART score 4. Hx of pericarditis, but normal lipase. Likely due to strained intercostal muscle. - initial trop neg x2; CXR neg; EKG no changes, am EKG with no changes - Cardiology following, appreciate recommendations - kpad to area of pain - lidocaine patch to area given continued pain - ECHO to r/u cardiac causes per cardio pending - tylenol PRN  AKI on CKD III: Improving. 3.98>3.46. patient states baseline Cr is 2.1, last Cr in Care Everywhere is 2-2.9.   - s/p 100cc NS per hour x 12 hours, 2L boluses - Obtain FeNa - Renal US normal - Given GFR <15, will start patient on phosphorus binder Renvela 800mg  TID. Patient encouraged to follow up with nephrology. Levothyroxine not to be taken at the same time. - Encourage patient to have good po intake.   T1DM with hx of poor control and osteomyelitis leading to amputations of all toes on R foot: most recent a1c 7.1, question decreased insulin metabolism with CKD. Home doses are lantus 40 BID and she states 20U humalog with each meal (note states BG <200 12 unit, 201-250 16 unit, 251-300 20 units, >300 24 units - Lantus 25U BID, given 30 U BID yesterday - moderate sliding scale - repeat a1c 9.3  Leukocytosis: appears to be chronic per care everywhere. Unclear etiology.  - monitor on daily CBC  Anemia: baseline Hgb appears between 10-11. Suspect  ACD given chronic illnesses. No recent iron studies.  - consider iron studies - will start every other day iron supplmentation  HTN:  Home meds are HCTZ, norvac and lisinopril.  - Hold ACEi and HCTZ in the setting of AKI.  - increasing norvasc to 10mg  given BP   Depression: sees psych outpatient, on cymbalta and klonipin.  - continue cymbalta - continue klonipin  HFpEF: last echo 2016 with EF 55-60% and normal wall motion. Appears euvolemic to dry on exam. CXR without signs of fluid overload.  - monitor fluid status on exam - repeat ECHO per cardiology  Hx of TIA: in 2014 admission. Secondary prevention would include ASA.  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 Recent TSH well controlled.  - continue home levothyroxine  HLD: last lipid profile 2017 with poor control.  - continue crestor 40mg  - Tot chol 183, HDL 27, LDL unable to calc, TG 541  FEN/GI: Heart healthy/ carb modified PPx: Lovenox  Disposition: Continued inpatient level of care  Subjective:  Patient reporting that she would like to go home. She is still having rib pain. She is followed closely by her nephrologist.   Objective: Temp:  [98.1 F (36.7 C)-98.6 F (37 C)] 98.1 F (36.7 C) (03/13 1342) Pulse Rate:  [87-89] 89 (03/13 1342) Resp:  [16] 16 (03/12 1958) BP: (148-156)/(74-77) 148/74 (03/13 1342) SpO2:  [100 %] 100 % (03/12 1958) Physical Exam: General: NAD, pleasant ENTM: Moist mucous membranes Cardiovascular: RRR, no m/r/g, no LE edema Respiratory: CTA BL,  normal work of breathing Gastrointestinal: soft, nontender, nondistended, normoactive BS MSK: tender along L rib cage with no noted rashes Derm: no rashes appreciated Neuro: CN II-XII grossly intact  Laboratory: Recent Labs  Lab 09/20/17 1036 09/21/17 0407 09/22/17 0429  WBC 13.0* 11.4* 11.1*  HGB 9.5* 9.0* 8.4*  HCT 28.3* 26.9* 24.6*  PLT 362 320 296   Recent Labs  Lab  09/20/17 1036 09/21/17 0407 09/22/17 0429  NA 137 140 138  K 4.0 3.4* 3.8  CL 105 110 111  CO2 20* 17* 19*  BUN 63* 63* 49*  CREATININE 3.98* 3.84* 3.46*  CALCIUM 7.3* 7.0* 7.2*  GLUCOSE 267* 236* 177*   Imaging/Diagnostic Tests: US Renal  Result Date: 09/21/2017 CLINICAL DATA:  Acute kidney injury. EXAM: RENAL / URINARY TRACT ULTRASOUND COMPLETE COMPARISON:  CT abdomen and pelvis 04/09/2014. FINDINGS: Right Kidney: Length: 12.1 cm. Echogenicity within normal limits. No mass or hydronephrosis visualized. Left Kidney: Length: 12.1 cm. Echogenicity within normal limits. No mass or hydronephrosis visualized. Bladder: Appears normal for degree of bladder distention. IMPRESSION: Negative for hydronephrosis.  Negative exam. Electronically Signed   By: Inge Rise M.D.   On: 09/21/2017 14:54   Mae Denunzio, [place], DO 09/22/2017, 2:44 PM PGY-1, [facility] Intern pager: [phone], text pages welcome

## 2017-09-22 NOTE — Progress Notes (Signed)
Inpatient Diabetes Program Recommendations  AACE/ADA: New Consensus Statement on Inpatient Glycemic Control (2015)  Target Ranges:  Prepandial:   less than 140 mg/dL      Peak postprandial:   less than 180 mg/dL (1-2 hours)      Critically ill patients:  140 - 180 mg/dL   Results for Suzanne Allen, Suzanne Allen (MRN [id number]) as of 09/22/2017 12:57  Ref. Range 09/21/2017 08:12 09/21/2017 11:24 09/21/2017 16:21 09/21/2017 19:59  Glucose-Capillary Latest Ref Range: 65 - 99 mg/dL 141 (H) 129 (H) 261 (H) 273 (H)     Patient saw her ENDO on 07/22/2017 and was given the following instructions: 1) Continue taking Lantus 40 units twice daily. 2) Test your blood sugar 3-4 times daily (before meals and at bedtime), and bring your meter and glucose logs back to your next appointment. 3) Take Humalog before each meal, based on the following scale: BG <200 12 units 201-250 16 units 251-300 20 units  >300 24 units  Current Orders: Lantus 25 units BID       Novolog Moderate Correction Scale/ SSI (0-15 units) TID AC    Note patient received a total of 60 units Lantus yesterday (got 30 units at [facility] and another 30 units at 12pm)  Lantus dose reduced to 25 units BID.   MD- Please consider starting Novolog Meal Coverage for this patient:  Novolog 3 units TID with meals (hold if pt eats <50% of meal)      --Will follow patient during hospitalization--  Wyn Quaker RN, MSN, CDE Diabetes Coordinator Inpatient Glycemic Control Team Team Pager: [phone] (8a-5p)

## 2017-09-22 NOTE — Progress Notes (Signed)
  Echocardiogram 2D Echocardiogram has been performed.  Suzanne Allen 09/22/2017, 10:45 AM

## 2017-09-22 NOTE — Progress Notes (Signed)
Awaiting results of echo.  If normal then no further cardiac workup in pt. Would consider outpt stress test.

## 2017-09-23 LAB — BASIC METABOLIC PANEL
Anion gap: 9 (ref 5–15)
BUN: 42 mg/dL — ABNORMAL HIGH (ref 6–20)
CO2: 20 mmol/L — ABNORMAL LOW (ref 22–32)
Calcium: 7.5 mg/dL — ABNORMAL LOW (ref 8.9–10.3)
Chloride: 111 mmol/L (ref 101–111)
Creatinine, Ser: 3.33 mg/dL — ABNORMAL HIGH (ref 0.44–1.00)
GFR calc Af Amer: 17 mL/min — ABNORMAL LOW (ref >60)
GFR calc non Af Amer: 15 mL/min — ABNORMAL LOW (ref >60)
Glucose, Bld: 107 mg/dL — ABNORMAL HIGH (ref 65–99)
Potassium: 3.5 mmol/L (ref 3.5–5.1)
Sodium: 140 mmol/L (ref 135–145)

## 2017-09-23 LAB — CBC
HCT: 24.7 % — ABNORMAL LOW (ref 36.0–46.0)
Hemoglobin: 8.3 g/dL — ABNORMAL LOW (ref 12.0–15.0)
MCH: 29.9 pg (ref 26.0–34.0)
MCHC: 33.6 g/dL (ref 30.0–36.0)
MCV: 88.8 fL (ref 78.0–100.0)
Platelets: 281 10*3/uL (ref 150–400)
RBC: 2.78 MIL/uL — ABNORMAL LOW (ref 3.87–5.11)
RDW: 13 % (ref 11.5–15.5)
WBC: 11.4 10*3/uL — ABNORMAL HIGH (ref 4.0–10.5)

## 2017-09-23 LAB — GLUCOSE, CAPILLARY
Glucose-Capillary: 89 mg/dL (ref 65–99)
Glucose-Capillary: 133 mg/dL — ABNORMAL HIGH (ref 65–99)

## 2017-09-23 MED ORDER — FERROUS SULFATE 325 (65 FE) MG PO TABS: 325.0000 mg | ORAL_TABLET | ORAL | 0 refills | Status: DC

## 2017-09-23 MED ORDER — ASPIRIN 81 MG PO TBEC: 81.0000 mg | DELAYED_RELEASE_TABLET | Freq: Every day | ORAL | 0 refills | Status: DC

## 2017-09-23 MED ORDER — AMLODIPINE BESYLATE 10 MG PO TABS: 10.0000 mg | ORAL_TABLET | Freq: Every day | ORAL | 0 refills | Status: DC

## 2017-09-23 MED ORDER — SEVELAMER CARBONATE 800 MG PO TABS: 800.0000 mg | ORAL_TABLET | Freq: Three times a day (TID) | ORAL | 0 refills | Status: DC

## 2017-09-23 NOTE — Progress Notes (Signed)
2D echo showed normal LVF and diastolic dysfunction.  Trop normal and atypical CP.  No further inpatient workup.  Please have patient Dr. Burt Knack back in clinic to set up outpt stress test once medical issues resolved given her risks factors.  Will sign off.  Call with any questions.

## 2017-09-23 NOTE — Progress Notes (Signed)
Family Medicine Teaching Service Daily Progress Note Intern Pager: [phone]  Patient name: Suzanne Allen Medical record number: [id number] Date of birth: [birth year] Age: 57 y.o. Gender: female  Primary Care Provider: Barrie Lyme, FNP Consultants: Cardiology Code Status: Full  Pt Overview and Major Events to Date:  Suzanne Allen is a 57 y.o. female presenting with chest pain, AKI. PMH is significant for T1DM, osteomyelitis s/p R toe amputations, HTN, HLD, chronic pancreatitis, depression.   Assessment and Plan: Chest pain:  Likely due to strained intercostal muscle. - Cardiology has signed off - lidocaine patch to area given continued pain - ECHO normal LVF and diastolic dysfunction - tylenol PRN  AKI on CKD III: Improving. 3.98>3.46>3.33. Patient states baseline Cr is 2.1, last Cr in Care Everywhere is 2-2.9.   - Renal US normal - Given GFR <15, will start patient on phosphorus binder Renvela 800mg  TID. Patient encouraged to follow up with nephrology. (Levothyroxine not to be taken at the same time). - Patient with FENa of 4.7% showing possible bladder outlet, but patient with history of CKD and this may not be clinically accurate. - Post-void residual performed looking for neurogenic bladder given uncontrolled diabetes was normal with 43mL remaining.  - Patient to move Nephrology appointment sooner  T1DM with hx of poor control and osteomyelitis leading to amputations of all toes on R foot: A1c 9.3, question decreased insulin metabolism with CKD. Home doses are lantus 40 BID and she states 20U humalog with each meal - Lantus 25U BID, mod SSI  Leukocytosis, stable: appears to be chronic per care everywhere. Unclear etiology.  - monitor on daily CBC  Anemia: baseline Hgb appears between 10-11. Suspect ACD given chronic illnesses. No recent iron studies.  - consider iron studies - will start every other day iron supplmentation  HTN:  Home meds are HCTZ, norvac and lisinopril.  Patient reports that lisinopril was recently increased. - Hold ACEi and HCTZ in the setting of AKI.  - increasing norvasc to 10mg  given BP   Depression: sees psych outpatient, on cymbalta and klonipin.  - continue cymbalta - continue klonipin  HFpEF: last echo 2016 with EF 55-60% and normal wall motion. Appears euvolemic to dry on exam. CXR without signs of fluid overload.  - monitor fluid status on exam - repeat ECHO per cardiology  Hx of TIA: in 2014 admission. Secondary prevention would include ASA.  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 Recent TSH well controlled.  - continue home levothyroxine  HLD: last lipid profile 2017 with poor control.  - continue crestor 40mg  - Tot chol 183, HDL 27, LDL unable to calc, TG 541  FEN/GI: Heart healthy/ carb modified PPx: Lovenox  Disposition: Discharge home with nephrology follow up  Subjective:  Patient voiced understanding of need to follow up with PCP for lab work and her nephrologist for her kidney function much sooner than her next appointment. She says she is feeling better and her chest pain is better and she does not need a lidocaine patch.   Objective: Temp:  [97.8 F (36.6 C)-98.1 F (36.7 C)] 97.8 F (36.6 C) (03/14 0431) Pulse Rate:  [80-89] 80 (03/14 0431) BP: (140-148)/(72-82) 140/82 (03/14 0431) SpO2:  [97 %-100 %] 97 % (03/14 0431) Weight:  [172 lb 3.2 oz (78.1 kg)] 172 lb 3.2 oz (78.1 kg) (03/14 0431) Physical Exam: General: NAD, pleasant ENTM: Moist mucous membranes Cardiovascular: RRR, no m/r/g, no LE edema Respiratory: CTA BL, normal work of breathing  Gastrointestinal: soft, nontender, nondistended MSK: moves 4 extremities equally Derm: no rashes appreciated Neuro: CN II-XII grossly intact Psych: Appropriate affect  Laboratory: Recent Labs  Lab 09/21/17 0407 09/22/17 0429 09/23/17 0554  WBC 11.4* 11.1* 11.4*  HGB 9.0* 8.4* 8.3*  HCT 26.9* 24.6*  24.7*  PLT 320 296 281   Recent Labs  Lab 09/22/17 0429 09/22/17 1426 09/23/17 0554  NA 138 138 140  K 3.8 3.9 3.5  CL 111 110 111  CO2 19* 20* 20*  BUN 49* 45* 42*  CREATININE 3.46* 3.52* 3.33*  CALCIUM 7.2* 7.3* 7.5*  GLUCOSE 177* 260* 107*   Imaging/Diagnostic Tests: No results found.   Gregori Abril, [place], DO 09/23/2017, 8:20 AM PGY-1, [facility] Intern pager: [phone], text pages welcome

## 2017-09-23 NOTE — Plan of Care (Signed)
Monitor Crt.

## 2017-09-23 NOTE — Discharge Instructions (Signed)
You are admitted with chest pain and found to have worsening kidney function.  We are glad you are doing better!  It is important that you continue to drink plenty of fluids after discharge.  Please schedule follow-up with your  While admitted, we held your lisinopril and HCTZ.  Recommend that you do not restart these medications until you see your nephrologist.  We have increased your dose of Norvasc to 10 mg/day in order to help control your blood pressure. Continue taking your Crestor 40mg  daily.   Given your worsening kidney function while admitted, we started you on a phosphate binder called Renvela.  This is to be taken 3 times a day with meals.  Please try to avoid taking it your levothyroxine at the same time as taking your Renvela. You should also start taking supplemental iron every other day.   Please schedule follow-up with your primary care physician within the next few days in order to check your lab work and blood pressure.

## 2017-11-03 ENCOUNTER — Ambulatory Visit (INDEPENDENT_AMBULATORY_CARE_PROVIDER_SITE_OTHER): Payer: Medicaid Other | Admitting: Psychiatry

## 2017-11-03 ENCOUNTER — Encounter (HOSPITAL_COMMUNITY): Payer: Self-pay | Admitting: Psychiatry

## 2017-11-03 DIAGNOSIS — Z9149 Other personal history of psychological trauma, not elsewhere classified: Secondary | ICD-10-CM | POA: Diagnosis not present

## 2017-11-03 DIAGNOSIS — F411 Generalized anxiety disorder: Secondary | ICD-10-CM | POA: Diagnosis not present

## 2017-11-03 DIAGNOSIS — Z62811 Personal history of psychological abuse in childhood: Secondary | ICD-10-CM | POA: Diagnosis not present

## 2017-11-03 DIAGNOSIS — Z811 Family history of alcohol abuse and dependence: Secondary | ICD-10-CM

## 2017-11-03 DIAGNOSIS — F330 Major depressive disorder, recurrent, mild: Secondary | ICD-10-CM

## 2017-11-03 DIAGNOSIS — Z818 Family history of other mental and behavioral disorders: Secondary | ICD-10-CM | POA: Diagnosis not present

## 2017-11-03 MED ORDER — DULOXETINE HCL 60 MG PO CPEP: 60.0000 mg | ORAL_CAPSULE | Freq: Every day | ORAL | 2 refills | Status: DC

## 2017-11-03 MED ORDER — CLONAZEPAM 1 MG PO TABS: 1.0000 mg | ORAL_TABLET | Freq: Every day | ORAL | 2 refills | Status: DC
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
## 2017-11-19 ENCOUNTER — Other Ambulatory Visit: Payer: Self-pay

## 2017-11-19 DIAGNOSIS — N185 Chronic kidney disease, stage 5: Secondary | ICD-10-CM

## 2017-12-20 ENCOUNTER — Ambulatory Visit (INDEPENDENT_AMBULATORY_CARE_PROVIDER_SITE_OTHER)
Admission: RE | Admit: 2017-12-20 | Discharge: 2017-12-20 | Disposition: A | Discharge status: Home / Self Care | Payer: Medicaid Other | Source: Ambulatory Visit | Attending: Vascular Surgery | Admitting: Vascular Surgery

## 2017-12-20 ENCOUNTER — Ambulatory Visit (HOSPITAL_COMMUNITY)
Admission: RE | Admit: 2017-12-20 | Discharge: 2017-12-20 | Disposition: A | Discharge status: Home / Self Care | Payer: Medicaid Other | Source: Ambulatory Visit | Attending: Vascular Surgery | Admitting: Vascular Surgery

## 2017-12-20 DIAGNOSIS — N185 Chronic kidney disease, stage 5: Secondary | ICD-10-CM

## 2017-12-22 ENCOUNTER — Encounter: Payer: Self-pay | Admitting: Vascular Surgery

## 2017-12-22 ENCOUNTER — Other Ambulatory Visit: Payer: Self-pay | Admitting: *Deleted

## 2017-12-22 ENCOUNTER — Encounter: Payer: Self-pay | Admitting: *Deleted

## 2017-12-22 ENCOUNTER — Ambulatory Visit (INDEPENDENT_AMBULATORY_CARE_PROVIDER_SITE_OTHER): Payer: Medicaid Other | Admitting: Vascular Surgery

## 2017-12-22 DIAGNOSIS — N185 Chronic kidney disease, stage 5: Secondary | ICD-10-CM | POA: Diagnosis not present

## 2017-12-22 NOTE — Progress Notes (Signed)
Requested by:  Barrie Lyme, [address] [place], [place] [address]  Reason for consultation: New access   History of Present Illness   Suzanne Allen is a 57 y.o. (March 27, 1961) female w/ IDDM who presents for evaluation for permanent access.  The patient is right hand dominant.  The patient has not had previous access procedures.  Previous central venous cannulation procedures include: none.  The patient has never had a PPM placed.   Past Medical History:  Diagnosis Date  . 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 Anxiety   . CAP (community acquired pneumonia) 03/13/2015  . Carpal tunnel syndrome 11/28/2014   Bilateral  . Chronic pancreatitis ([place])   . Depression   . Diabetes mellitus type I ([place]) dx'd 1977  . Diabetic osteomyelitis ([place])    right great toe/progress notes 09/23/2012  . Diabetic retinopathy ([place])   . Fatty liver 04/04/2014  . HTN (hypertension)   . Hypercholesteremia   . Hypothyroidism   . Macular degeneration, bilateral   . Migraines 1980's   "when I was in my 20's" (03/14/2015)  . Peripheral neuropathy   . Pneumonia 1993  . PTSD (post-traumatic stress disorder)   . Stroke [facility]) ? date & 09/22/2012  . TIA (transient ischemic attack) 09/22/2012   [name] 10/25/2012    Past Surgical History:  Procedure Laterality Date  . AMPUTATION Right 07/12/2015   Procedure: Right Transmetatarsal amputation;  Surgeon: Newt Minion, MD;  Location: [facility];  Service: Orthopedics;  Laterality: Right;  . [place]; 1995  . EYE SURGERY Bilateral    "hemorrhaged behind my eye"  . I&D EXTREMITY Right multiple   "big toe"  . PERIPHERALLY INSERTED CENTRAL CATHETER INSERTION Right 08/31/2012  . PICC LINE REMOVAL ([place] HX) Right 10/12/2012   [name] 10/12/2012  . REFRACTIVE SURGERY Bilateral 2013  . TOE AMPUTATION Right 11/2012   "big toe"  . TONSILLECTOMY  1965  . TOTAL THYROIDECTOMY  2012  . TUBAL LIGATION  1995    Social History    Socioeconomic History  . Marital status: Divorced    Spouse name: Not on file  . Number of children: Not on file  . Years of education: Not on file  . Highest education level: Not on file  Occupational History  . Not on file  Social Needs  . Financial resource strain: Not on file  . Food insecurity:    Worry: Not on file    Inability: Not on file  . Transportation needs:    Medical: Not on file    Non-medical: Not on file  Tobacco Use  . Smoking status: Never Smoker  . Smokeless tobacco: Never Used  Substance and Sexual Activity  . Alcohol use: No    Alcohol/week: 0.0 oz  . Drug use: No  . Sexual activity: Not Currently  Lifestyle  . Physical activity:    Days per week: Not on file    Minutes per session: Not on file  . Stress: Not on file  Relationships  . Social connections:    Talks on phone: Not on file    Gets together: Not on file    Attends religious service: Not on file    Active member of club or organization: Not on file    Attends meetings of clubs or organizations: Not on file    Relationship status: Not on file  . Intimate partner violence:    Fear of current or  ex partner: Not on file    Emotionally abused: Not on file    Physically abused: Not on file    Forced sexual activity: Not on file  Other Topics Concern  . Not on file  Social History Narrative   Lives in [place] with daughter and father. Completely independent in ADLs, IADLs.    Family History  Problem Relation Age of Onset  . Depression Mother   . Diabetes Mother   . Alcohol abuse Father   . Alcohol abuse Sister   . Diabetes Maternal Grandmother     Current Outpatient Medications  Medication Sig Dispense Refill  . albuterol (PROVENTIL HFA;VENTOLIN HFA) 108 (90 Base) MCG/ACT inhaler Inhale 2 puffs into the lungs every 4 (four) hours as needed for wheezing.    Marland Kitchen aspirin EC 81 MG EC tablet Take 1 tablet (81 mg total) by mouth daily. 30 tablet 0  . clonazePAM (KLONOPIN) 1 MG  tablet Take 1 tablet (1 mg total) by mouth at bedtime. 30 tablet 2  . Dorzolamide HCl-Timolol Mal (COSOPT OP) Apply to eye.    . DULoxetine (CYMBALTA) 60 MG capsule Take 1 capsule (60 mg total) by mouth daily. 30 capsule 2  . ferrous sulfate 325 (65 FE) MG tablet Take 1 tablet (325 mg total) by mouth every other day. 15 tablet 0  . hydrochlorothiazide (HYDRODIURIL) 25 MG tablet   5  . insulin glargine (LANTUS) 100 UNIT/ML injection Inject 40 Units into the skin 2 (two) times daily.    . insulin lispro (HUMALOG) 100 UNIT/ML injection Inject into the skin See admin instructions. Inject up to 25 units subcutaneously Per sliding scale as directed    . levothyroxine (SYNTHROID, LEVOTHROID) 150 MCG tablet Take 150 mcg by mouth daily before breakfast.    . lisinopril (PRINIVIL,ZESTRIL) 40 MG tablet   5  . rosuvastatin (CRESTOR) 40 MG tablet Take 40 mg by mouth daily.    . sevelamer carbonate (RENVELA) 800 MG tablet Take 1 tablet (800 mg total) by mouth 3 (three) times daily with meals. Avoid taking with levothyroxine 90 tablet 0   No current facility-administered medications for this visit.     Allergies  Allergen Reactions  . Erythromycin Anaphylaxis  . Vancomycin Shortness Of Breath and Other (See Comments)    Red Man Syndrome Has patient had a PCN reaction causing immediate rash, facial/tongue/throat swelling, SOB or lightheadedness with hypotension: unk Has patient had a PCN reaction causing severe rash involving mucus membranes or skin necrosis: unk Has patient had a PCN reaction that required hospitalization: unk Has patient had a PCN reaction occurring within the last 10 years: unk If all of the above answers are "NO", then may proceed with Cephalosporin use.    . Nsaids Other (See Comments)    Avoid due to CKD    REVIEW OF SYSTEMS (negative unless checked):   Cardiac:  []  Chest pain or chest pressure? []  Shortness of breath upon activity? []  Shortness of breath when lying  flat? [x]  Irregular heart rhythm?  Vascular:  []  Pain in calf, thigh, or hip brought on by walking? [x]  Pain in feet at night that wakes you up from your sleep? []  Blood clot in your veins? []  Leg swelling?  Pulmonary:  []  Oxygen at home? []  Productive cough? []  Wheezing?  Neurologic:  [x]  Sudden weakness in arms or legs? []  Sudden numbness in arms or legs? []  Sudden onset of difficult speaking or slurred speech? []  Temporary loss of vision in one eye? [  x] Problems with dizziness?  Gastrointestinal:  []  Blood in stool? []  Vomited blood?  Genitourinary:  []  Burning when urinating? []  Blood in urine?  Psychiatric:  [x]  Major depression  Hematologic:  []  Bleeding problems? []  Problems with blood clotting?  Dermatologic:  []  Rashes or ulcers?  Constitutional:  []  Fever or chills?  Ear/Nose/Throat:  []  Change in hearing? []  Nose bleeds? []  Sore throat?  Musculoskeletal:  []  Back pain? []  Joint pain? []  Muscle pain?   Physical Examination     Vitals:   12/22/17 1119  BP: (!) 161/63  Pulse: 72  Temp: 98 F (36.7 C)  SpO2: 100%  Weight: 162 lb 12.8 oz (73.8 kg)  Height: 5\' 3"  (1.6 m)   Body mass index is 28.84 kg/m.  General Alert, O x 3, WD, NAD  Head [place]/AT,    Ear/Nose/ Throat Hearing grossly intact, nares without erythema or drainage, oropharynx without Erythema or Exudate, Mallampati score: 3,   Eyes PERRLA, EOMI,    Neck Supple, mid-line trachea,    Pulmonary Sym exp, good B air movt, CTA B  Cardiac RRR, Nl S1, S2, no Murmurs, No rubs, No S3,S4  Vascular Vessel Right Left  Radial Palpable Palpable  Brachial Palpable Palpable  Carotid Palpable, No Bruit Palpable, No Bruit  Aorta Not palpable N/A  Femoral Palpable Palpable  Popliteal Not palpable Not palpable  PT Not palpable Not palpable  DP Not palpable Not palpable    Gastro- intestinal soft, non-distended, non-tender to palpation, No guarding or rebound, no HSM, no masses, no CVAT  B, No palpable prominent aortic pulse,    Musculo- skeletal M/S 5/5 throughout  , Extremities without ischemic changes  , No edema present, No visible varicosities , No Lipodermatosclerosis present  Neurologic Cranial nerves grossly intact , Pain and light touch intact in extremities , Motor exam as listed above  Psychiatric Judgement intact, Mood & affect appropriate for pt's clinical situation  Dermatologic See M/S exam for extremity exam, No rashes otherwise noted  Lymphatic  Palpable lymph nodes: None     Non-invasive Vascular Imaging   BUE Vein Mapping  (Date: 12/22/2017):   R arm: acceptable vein conduits include R upper arm basilic  L arm: acceptable vein conduits include L upper arm basilic  BUE Doppler (Date: 12/22/2017):   R arm:   Brachial: tri, 5.7 mm  Radial: tri, 1.4 mm  Ulnar: tri, 1.4 mm  L arm:   Brachial: tri, 5.9 mm (high bifurcation, proximal upper arm)  Radial: tri, 1.3 mm  Ulnar: tri, 1.7 mm   Outside Studies/Documentation   10 pages of outside documents were reviewed including: outpatient nephrology chart.   Medical Decision Making   Suzanne Allen is a 57 y.o. female who presents with chronic kidney disease stage V   Based on vein mapping and examination, this patient's permanent access options include: L staged BVT, R staged BVT.  I would first proceed with left staged basilic vein transposition.  I had an extensive discussion with this patient in regards to the nature of access surgery, including risk, benefits, and alternatives.    The patient is aware that the risks of access surgery include but are not limited to: bleeding, infection, steal syndrome, nerve damage, ischemic monomelic neuropathy, failure of access to mature, complications related to venous hypertension, and possible need for additional access procedures in the future. I discussed with the patient the nature of the staged access procedure, specifically the need for a second  operation to  transpose the first stage fistula if it matures adequately.    The patient has agreed to proceed with the above procedure which will be scheduled 21 JUN 19.   Adele Barthel, MD, FACS [facility] Office: [phone] Pager: [phone]  12/22/2017, 11:28 AM

## 2017-12-22 NOTE — H&P (View-Only) (Signed)
Requested by:  Barrie Lyme, [address] [place], [place] [address]  Reason for consultation: New access   History of Present Illness   Suzanne Allen is a 57 y.o. (Dec 30, 1960) female w/ IDDM who presents for evaluation for permanent access.  The patient is right hand dominant.  The patient has not had previous access procedures.  Previous central venous cannulation procedures include: none.  The patient has never had a PPM placed.   Past Medical History:  Diagnosis Date  . 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 Anxiety   . CAP (community acquired pneumonia) 03/13/2015  . Carpal tunnel syndrome 11/28/2014   Bilateral  . Chronic pancreatitis ([place])   . Depression   . Diabetes mellitus type I ([place]) dx'd 1977  . Diabetic osteomyelitis ([place])    right great toe/progress notes 09/23/2012  . Diabetic retinopathy ([place])   . Fatty liver 04/04/2014  . HTN (hypertension)   . Hypercholesteremia   . Hypothyroidism   . Macular degeneration, bilateral   . Migraines 1980's   "when I was in my 20's" (03/14/2015)  . Peripheral neuropathy   . Pneumonia 1993  . PTSD (post-traumatic stress disorder)   . Stroke [facility]) ? date & 09/22/2012  . TIA (transient ischemic attack) 09/22/2012   [name] 10/25/2012    Past Surgical History:  Procedure Laterality Date  . AMPUTATION Right 07/12/2015   Procedure: Right Transmetatarsal amputation;  Surgeon: Newt Minion, MD;  Location: [facility];  Service: Orthopedics;  Laterality: Right;  . [place]; 1995  . EYE SURGERY Bilateral    "hemorrhaged behind my eye"  . I&D EXTREMITY Right multiple   "big toe"  . PERIPHERALLY INSERTED CENTRAL CATHETER INSERTION Right 08/31/2012  . PICC LINE REMOVAL ([place] HX) Right 10/12/2012   [name] 10/12/2012  . REFRACTIVE SURGERY Bilateral 2013  . TOE AMPUTATION Right 11/2012   "big toe"  . TONSILLECTOMY  1965  . TOTAL THYROIDECTOMY  2012  . TUBAL LIGATION  1995    Social History    Socioeconomic History  . Marital status: Divorced    Spouse name: Not on file  . Number of children: Not on file  . Years of education: Not on file  . Highest education level: Not on file  Occupational History  . Not on file  Social Needs  . Financial resource strain: Not on file  . Food insecurity:    Worry: Not on file    Inability: Not on file  . Transportation needs:    Medical: Not on file    Non-medical: Not on file  Tobacco Use  . Smoking status: Never Smoker  . Smokeless tobacco: Never Used  Substance and Sexual Activity  . Alcohol use: No    Alcohol/week: 0.0 oz  . Drug use: No  . Sexual activity: Not Currently  Lifestyle  . Physical activity:    Days per week: Not on file    Minutes per session: Not on file  . Stress: Not on file  Relationships  . Social connections:    Talks on phone: Not on file    Gets together: Not on file    Attends religious service: Not on file    Active member of club or organization: Not on file    Attends meetings of clubs or organizations: Not on file    Relationship status: Not on file  . Intimate partner violence:    Fear of current or  ex partner: Not on file    Emotionally abused: Not on file    Physically abused: Not on file    Forced sexual activity: Not on file  Other Topics Concern  . Not on file  Social History Narrative   Lives in [place] with daughter and father. Completely independent in ADLs, IADLs.    Family History  Problem Relation Age of Onset  . Depression Mother   . Diabetes Mother   . Alcohol abuse Father   . Alcohol abuse Sister   . Diabetes Maternal Grandmother     Current Outpatient Medications  Medication Sig Dispense Refill  . albuterol (PROVENTIL HFA;VENTOLIN HFA) 108 (90 Base) MCG/ACT inhaler Inhale 2 puffs into the lungs every 4 (four) hours as needed for wheezing.    Marland Kitchen aspirin EC 81 MG EC tablet Take 1 tablet (81 mg total) by mouth daily. 30 tablet 0  . clonazePAM (KLONOPIN) 1 MG  tablet Take 1 tablet (1 mg total) by mouth at bedtime. 30 tablet 2  . Dorzolamide HCl-Timolol Mal (COSOPT OP) Apply to eye.    . DULoxetine (CYMBALTA) 60 MG capsule Take 1 capsule (60 mg total) by mouth daily. 30 capsule 2  . ferrous sulfate 325 (65 FE) MG tablet Take 1 tablet (325 mg total) by mouth every other day. 15 tablet 0  . hydrochlorothiazide (HYDRODIURIL) 25 MG tablet   5  . insulin glargine (LANTUS) 100 UNIT/ML injection Inject 40 Units into the skin 2 (two) times daily.    . insulin lispro (HUMALOG) 100 UNIT/ML injection Inject into the skin See admin instructions. Inject up to 25 units subcutaneously Per sliding scale as directed    . levothyroxine (SYNTHROID, LEVOTHROID) 150 MCG tablet Take 150 mcg by mouth daily before breakfast.    . lisinopril (PRINIVIL,ZESTRIL) 40 MG tablet   5  . rosuvastatin (CRESTOR) 40 MG tablet Take 40 mg by mouth daily.    . sevelamer carbonate (RENVELA) 800 MG tablet Take 1 tablet (800 mg total) by mouth 3 (three) times daily with meals. Avoid taking with levothyroxine 90 tablet 0   No current facility-administered medications for this visit.     Allergies  Allergen Reactions  . Erythromycin Anaphylaxis  . Vancomycin Shortness Of Breath and Other (See Comments)    Red Man Syndrome Has patient had a PCN reaction causing immediate rash, facial/tongue/throat swelling, SOB or lightheadedness with hypotension: unk Has patient had a PCN reaction causing severe rash involving mucus membranes or skin necrosis: unk Has patient had a PCN reaction that required hospitalization: unk Has patient had a PCN reaction occurring within the last 10 years: unk If all of the above answers are "NO", then may proceed with Cephalosporin use.    . Nsaids Other (See Comments)    Avoid due to CKD    REVIEW OF SYSTEMS (negative unless checked):   Cardiac:  []  Chest pain or chest pressure? []  Shortness of breath upon activity? []  Shortness of breath when lying  flat? [x]  Irregular heart rhythm?  Vascular:  []  Pain in calf, thigh, or hip brought on by walking? [x]  Pain in feet at night that wakes you up from your sleep? []  Blood clot in your veins? []  Leg swelling?  Pulmonary:  []  Oxygen at home? []  Productive cough? []  Wheezing?  Neurologic:  [x]  Sudden weakness in arms or legs? []  Sudden numbness in arms or legs? []  Sudden onset of difficult speaking or slurred speech? []  Temporary loss of vision in one eye? [  x] Problems with dizziness?  Gastrointestinal:  []  Blood in stool? []  Vomited blood?  Genitourinary:  []  Burning when urinating? []  Blood in urine?  Psychiatric:  [x]  Major depression  Hematologic:  []  Bleeding problems? []  Problems with blood clotting?  Dermatologic:  []  Rashes or ulcers?  Constitutional:  []  Fever or chills?  Ear/Nose/Throat:  []  Change in hearing? []  Nose bleeds? []  Sore throat?  Musculoskeletal:  []  Back pain? []  Joint pain? []  Muscle pain?   Physical Examination     Vitals:   12/22/17 1119  BP: (!) 161/63  Pulse: 72  Temp: 98 F (36.7 C)  SpO2: 100%  Weight: 162 lb 12.8 oz (73.8 kg)  Height: 5\' 3"  (1.6 m)   Body mass index is 28.84 kg/m.  General Alert, O x 3, WD, NAD  Head [place]/AT,    Ear/Nose/ Throat Hearing grossly intact, nares without erythema or drainage, oropharynx without Erythema or Exudate, Mallampati score: 3,   Eyes PERRLA, EOMI,    Neck Supple, mid-line trachea,    Pulmonary Sym exp, good B air movt, CTA B  Cardiac RRR, Nl S1, S2, no Murmurs, No rubs, No S3,S4  Vascular Vessel Right Left  Radial Palpable Palpable  Brachial Palpable Palpable  Carotid Palpable, No Bruit Palpable, No Bruit  Aorta Not palpable N/A  Femoral Palpable Palpable  Popliteal Not palpable Not palpable  PT Not palpable Not palpable  DP Not palpable Not palpable    Gastro- intestinal soft, non-distended, non-tender to palpation, No guarding or rebound, no HSM, no masses, no CVAT  B, No palpable prominent aortic pulse,    Musculo- skeletal M/S 5/5 throughout  , Extremities without ischemic changes  , No edema present, No visible varicosities , No Lipodermatosclerosis present  Neurologic Cranial nerves grossly intact , Pain and light touch intact in extremities , Motor exam as listed above  Psychiatric Judgement intact, Mood & affect appropriate for pt's clinical situation  Dermatologic See M/S exam for extremity exam, No rashes otherwise noted  Lymphatic  Palpable lymph nodes: None     Non-invasive Vascular Imaging   BUE Vein Mapping  (Date: 12/22/2017):   R arm: acceptable vein conduits include R upper arm basilic  L arm: acceptable vein conduits include L upper arm basilic  BUE Doppler (Date: 12/22/2017):   R arm:   Brachial: tri, 5.7 mm  Radial: tri, 1.4 mm  Ulnar: tri, 1.4 mm  L arm:   Brachial: tri, 5.9 mm (high bifurcation, proximal upper arm)  Radial: tri, 1.3 mm  Ulnar: tri, 1.7 mm   Outside Studies/Documentation   10 pages of outside documents were reviewed including: outpatient nephrology chart.   Medical Decision Making   Almena Hokenson is a 57 y.o. female who presents with chronic kidney disease stage V   Based on vein mapping and examination, this patient's permanent access options include: L staged BVT, R staged BVT.  I would first proceed with left staged basilic vein transposition.  I had an extensive discussion with this patient in regards to the nature of access surgery, including risk, benefits, and alternatives.    The patient is aware that the risks of access surgery include but are not limited to: bleeding, infection, steal syndrome, nerve damage, ischemic monomelic neuropathy, failure of access to mature, complications related to venous hypertension, and possible need for additional access procedures in the future. I discussed with the patient the nature of the staged access procedure, specifically the need for a second  operation to  transpose the first stage fistula if it matures adequately.    The patient has agreed to proceed with the above procedure which will be scheduled 21 JUN 19.   Adele Barthel, MD, FACS [facility] Office: [phone] Pager: [phone]  12/22/2017, 11:28 AM

## 2017-12-29 ENCOUNTER — Encounter (HOSPITAL_COMMUNITY): Payer: Self-pay

## 2017-12-29 ENCOUNTER — Other Ambulatory Visit: Payer: Self-pay

## 2017-12-29 NOTE — Progress Notes (Signed)
Spoke with Suzanne Allen for pre-op call. Suzanne Allen denies cardiac history. Suzanne Allen states that she is type 1 diabetic and fasting blood sugar is 120. Last A1C was obtained 1.5 months ago. Suzanne Allen checks blood sugar 6-8 times a day. Educated patient on taking 80% of Lantus the night before and morning of procedure (32 units of her normal 40 unit injection). Also instructed Suzanne Allen to take 50% of Humalog per sliding scale if blood sugar is greater than 220. Suzanne Allen educated on how to correct a low blood sugar of less than 70.  Used teach back method for verbal understanding. Suzanne Allen denies any questions at this time.  [name], RN

## 2017-12-31 ENCOUNTER — Encounter (HOSPITAL_COMMUNITY): Payer: Self-pay | Admitting: Anesthesiology

## 2017-12-31 ENCOUNTER — Other Ambulatory Visit: Payer: Self-pay

## 2017-12-31 ENCOUNTER — Telehealth: Payer: Self-pay | Admitting: Vascular Surgery

## 2017-12-31 ENCOUNTER — Ambulatory Visit (HOSPITAL_COMMUNITY)
Admission: RE | Admit: 2017-12-31 | Discharge: 2017-12-31 | Disposition: A | Discharge status: Home / Self Care | Payer: Medicaid Other | Source: Ambulatory Visit | Attending: Vascular Surgery | Admitting: Vascular Surgery

## 2017-12-31 ENCOUNTER — Ambulatory Visit (HOSPITAL_COMMUNITY): Payer: Medicaid Other | Admitting: Anesthesiology

## 2017-12-31 ENCOUNTER — Encounter (HOSPITAL_COMMUNITY)
Admission: RE | Disposition: A | Discharge status: Home / Self Care | Payer: Self-pay | Source: Ambulatory Visit | Attending: Vascular Surgery

## 2017-12-31 DIAGNOSIS — Z79890 Hormone replacement therapy (postmenopausal): Secondary | ICD-10-CM | POA: Diagnosis not present

## 2017-12-31 DIAGNOSIS — E1122 Type 2 diabetes mellitus with diabetic chronic kidney disease: Secondary | ICD-10-CM | POA: Insufficient documentation

## 2017-12-31 DIAGNOSIS — F329 Major depressive disorder, single episode, unspecified: Secondary | ICD-10-CM | POA: Diagnosis not present

## 2017-12-31 DIAGNOSIS — E11319 Type 2 diabetes mellitus with unspecified diabetic retinopathy without macular edema: Secondary | ICD-10-CM | POA: Insufficient documentation

## 2017-12-31 DIAGNOSIS — Z89431 Acquired absence of right foot: Secondary | ICD-10-CM | POA: Insufficient documentation

## 2017-12-31 DIAGNOSIS — I120 Hypertensive chronic kidney disease with stage 5 chronic kidney disease or end stage renal disease: Secondary | ICD-10-CM | POA: Diagnosis present

## 2017-12-31 DIAGNOSIS — N185 Chronic kidney disease, stage 5: Secondary | ICD-10-CM | POA: Insufficient documentation

## 2017-12-31 DIAGNOSIS — Z8673 Personal history of transient ischemic attack (TIA), and cerebral infarction without residual deficits: Secondary | ICD-10-CM | POA: Diagnosis not present

## 2017-12-31 DIAGNOSIS — Z89421 Acquired absence of other right toe(s): Secondary | ICD-10-CM | POA: Diagnosis not present

## 2017-12-31 DIAGNOSIS — E1142 Type 2 diabetes mellitus with diabetic polyneuropathy: Secondary | ICD-10-CM | POA: Insufficient documentation

## 2017-12-31 DIAGNOSIS — H3530 Unspecified macular degeneration: Secondary | ICD-10-CM | POA: Insufficient documentation

## 2017-12-31 DIAGNOSIS — E039 Hypothyroidism, unspecified: Secondary | ICD-10-CM | POA: Diagnosis not present

## 2017-12-31 DIAGNOSIS — F4310 Post-traumatic stress disorder, unspecified: Secondary | ICD-10-CM | POA: Insufficient documentation

## 2017-12-31 DIAGNOSIS — Z794 Long term (current) use of insulin: Secondary | ICD-10-CM | POA: Diagnosis not present

## 2017-12-31 DIAGNOSIS — Z7982 Long term (current) use of aspirin: Secondary | ICD-10-CM | POA: Diagnosis not present

## 2017-12-31 DIAGNOSIS — Z79899 Other long term (current) drug therapy: Secondary | ICD-10-CM | POA: Insufficient documentation

## 2017-12-31 DIAGNOSIS — E7800 Pure hypercholesterolemia, unspecified: Secondary | ICD-10-CM | POA: Insufficient documentation

## 2017-12-31 DIAGNOSIS — D631 Anemia in chronic kidney disease: Secondary | ICD-10-CM | POA: Diagnosis not present

## 2017-12-31 HISTORY — DX: Anemia, unspecified: D64.9

## 2017-12-31 HISTORY — PX: BASCILIC VEIN TRANSPOSITION: SHX5742

## 2017-12-31 HISTORY — DX: Chronic kidney disease, unspecified: N18.9

## 2017-12-31 LAB — GLUCOSE, CAPILLARY
Glucose-Capillary: 86 mg/dL (ref 65–99)
Glucose-Capillary: 84 mg/dL (ref 65–99)

## 2017-12-31 LAB — POCT I-STAT 4, (NA,K, GLUC, HGB,HCT)
Glucose, Bld: 125 mg/dL — ABNORMAL HIGH (ref 65–99)
HCT: 29 % — ABNORMAL LOW (ref 36.0–46.0)
Hemoglobin: 9.9 g/dL — ABNORMAL LOW (ref 12.0–15.0)
Potassium: 3.5 mmol/L (ref 3.5–5.1)
Sodium: 140 mmol/L (ref 135–145)

## 2017-12-31 SURGERY — TRANSPOSITION, VEIN, BASILIC
Anesthesia: Monitor Anesthesia Care | Laterality: Left

## 2017-12-31 MED ORDER — SODIUM CHLORIDE 0.9 % IV SOLN
INTRAVENOUS | Status: DC | PRN
  Administered 2017-12-31: 500 mL

## 2017-12-31 MED ORDER — LIDOCAINE HCL (PF) 1 % IJ SOLN
INTRAMUSCULAR | Status: AC
  Filled 2017-12-31: qty 30

## 2017-12-31 MED ORDER — FENTANYL CITRATE (PF) 250 MCG/5ML IJ SOLN
INTRAMUSCULAR | Status: AC
Start: 2017-12-31 — End: ?
  Filled 2017-12-31: qty 5

## 2017-12-31 MED ORDER — PHENYLEPHRINE HCL 10 MG/ML IJ SOLN
INTRAMUSCULAR | Status: DC | PRN
  Administered 2017-12-31 (×4): 80 ug via INTRAVENOUS
  Administered 2017-12-31: 120 ug via INTRAVENOUS

## 2017-12-31 MED ORDER — PROPOFOL 10 MG/ML IV BOLUS
INTRAVENOUS | Status: AC
  Filled 2017-12-31: qty 40

## 2017-12-31 MED ORDER — CHLORHEXIDINE GLUCONATE 4 % EX LIQD: 60.0000 mL | Freq: Once | CUTANEOUS | Status: DC

## 2017-12-31 MED ORDER — SODIUM CHLORIDE 0.9 % IV SOLN: INTRAVENOUS | Status: DC

## 2017-12-31 MED ORDER — OXYCODONE-ACETAMINOPHEN 5-325 MG PO TABS: 1.0000 | ORAL_TABLET | Freq: Four times a day (QID) | ORAL | 0 refills | Status: DC | PRN

## 2017-12-31 MED ORDER — CEFAZOLIN SODIUM-DEXTROSE 2-4 GM/100ML-% IV SOLN
2.0000 g | INTRAVENOUS | Status: AC
  Administered 2017-12-31: 2 g via INTRAVENOUS

## 2017-12-31 MED ORDER — BUPIVACAINE HCL (PF) 0.5 % IJ SOLN
INTRAMUSCULAR | Status: AC
  Filled 2017-12-31: qty 30

## 2017-12-31 MED ORDER — DEXAMETHASONE SODIUM PHOSPHATE 4 MG/ML IJ SOLN
INTRAMUSCULAR | Status: DC | PRN
  Administered 2017-12-31: 10 mg via INTRAVENOUS

## 2017-12-31 MED ORDER — PROPOFOL 10 MG/ML IV BOLUS
INTRAVENOUS | Status: DC | PRN
  Administered 2017-12-31: 120 mg via INTRAVENOUS

## 2017-12-31 MED ORDER — SODIUM CHLORIDE 0.9 % IV SOLN
INTRAVENOUS | Status: AC
  Filled 2017-12-31: qty 1.2

## 2017-12-31 MED ORDER — MIDAZOLAM HCL 5 MG/5ML IJ SOLN
INTRAMUSCULAR | Status: DC | PRN
  Administered 2017-12-31: 2 mg via INTRAVENOUS

## 2017-12-31 MED ORDER — LIDOCAINE 2% (20 MG/ML) 5 ML SYRINGE
INTRAMUSCULAR | Status: AC
  Filled 2017-12-31: qty 5

## 2017-12-31 MED ORDER — ONDANSETRON HCL 4 MG/2ML IJ SOLN
INTRAMUSCULAR | Status: DC | PRN
  Administered 2017-12-31: 4 mg via INTRAVENOUS

## 2017-12-31 MED ORDER — 0.9 % SODIUM CHLORIDE (POUR BTL) OPTIME
TOPICAL | Status: DC | PRN
  Administered 2017-12-31: 1000 mL

## 2017-12-31 MED ORDER — MIDAZOLAM HCL 2 MG/2ML IJ SOLN
INTRAMUSCULAR | Status: AC
  Filled 2017-12-31: qty 2

## 2017-12-31 MED ORDER — PROPOFOL 1000 MG/100ML IV EMUL
INTRAVENOUS | Status: AC
  Filled 2017-12-31: qty 100

## 2017-12-31 MED ORDER — FENTANYL CITRATE (PF) 100 MCG/2ML IJ SOLN
INTRAMUSCULAR | Status: DC | PRN
  Administered 2017-12-31 (×2): 50 ug via INTRAVENOUS

## 2017-12-31 MED ORDER — LIDOCAINE 2% (20 MG/ML) 5 ML SYRINGE
INTRAMUSCULAR | Status: DC | PRN
  Administered 2017-12-31: 80 mg via INTRAVENOUS

## 2017-12-31 MED ORDER — EPHEDRINE SULFATE 50 MG/ML IJ SOLN
INTRAMUSCULAR | Status: DC | PRN
  Administered 2017-12-31 (×3): 10 mg via INTRAVENOUS

## 2017-12-31 MED ORDER — SODIUM CHLORIDE 0.9 % IV SOLN
INTRAVENOUS | Status: DC
  Administered 2017-12-31: 08:00:00 via INTRAVENOUS

## 2017-12-31 MED ORDER — CEFAZOLIN SODIUM-DEXTROSE 2-4 GM/100ML-% IV SOLN
INTRAVENOUS | Status: AC
  Filled 2017-12-31: qty 100

## 2017-12-31 MED ORDER — CHLORHEXIDINE GLUCONATE 4 % EX LIQD
60.0000 mL | Freq: Once | CUTANEOUS | Status: DC
Start: 2018-01-01 — End: 2017-12-31

## 2017-12-31 SURGICAL SUPPLY — 36 items
ARMBAND PINK RESTRICT EXTREMIT (MISCELLANEOUS) ×2 IMPLANT
CANISTER SUCT 3000ML PPV (MISCELLANEOUS) ×2 IMPLANT
CLIP VESOCCLUDE MED 24/CT (CLIP) ×2 IMPLANT
CLIP VESOCCLUDE SM WIDE 24/CT (CLIP) ×2 IMPLANT
CORDS BIPOLAR (ELECTRODE) IMPLANT
COVER PROBE W GEL 5X96 (DRAPES) ×2 IMPLANT
DECANTER SPIKE VIAL GLASS SM (MISCELLANEOUS) ×2 IMPLANT
DERMABOND ADVANCED (GAUZE/BANDAGES/DRESSINGS) ×1
DERMABOND ADVANCED .7 DNX12 (GAUZE/BANDAGES/DRESSINGS) ×1 IMPLANT
ELECT REM PT RETURN 9FT ADLT (ELECTROSURGICAL) ×2
ELECTRODE REM PT RTRN 9FT ADLT (ELECTROSURGICAL) ×1 IMPLANT
GLOVE BIO SURGEON STRL SZ7 (GLOVE) ×2 IMPLANT
GLOVE BIOGEL PI IND STRL 6.5 (GLOVE) ×1 IMPLANT
GLOVE BIOGEL PI IND STRL 7.5 (GLOVE) ×1 IMPLANT
GLOVE BIOGEL PI INDICATOR 6.5 (GLOVE) ×1
GLOVE BIOGEL PI INDICATOR 7.5 (GLOVE) ×1
GOWN STRL REUS W/ TWL LRG LVL3 (GOWN DISPOSABLE) ×3 IMPLANT
GOWN STRL REUS W/TWL LRG LVL3 (GOWN DISPOSABLE) ×3
HEMOSTAT SPONGE AVITENE ULTRA (HEMOSTASIS) IMPLANT
KIT BASIN OR (CUSTOM PROCEDURE TRAY) ×2 IMPLANT
KIT TURNOVER KIT B (KITS) ×2 IMPLANT
NEEDLE HYPO 25GX1X1/2 BEV (NEEDLE) ×2 IMPLANT
NS IRRIG 1000ML POUR BTL (IV SOLUTION) ×2 IMPLANT
PACK CV ACCESS (CUSTOM PROCEDURE TRAY) ×2 IMPLANT
PAD ARMBOARD 7.5X6 YLW CONV (MISCELLANEOUS) ×4 IMPLANT
SUT MNCRL AB 4-0 PS2 18 (SUTURE) ×2 IMPLANT
SUT PROLENE 6 0 BV (SUTURE) ×4 IMPLANT
SUT PROLENE 7 0 BV 1 (SUTURE) ×4 IMPLANT
SUT SILK 2 0 SH (SUTURE) IMPLANT
SUT VIC AB 2-0 CT1 27 (SUTURE) ×1
SUT VIC AB 2-0 CT1 TAPERPNT 27 (SUTURE) ×1 IMPLANT
SUT VIC AB 3-0 SH 27 (SUTURE) ×2
SUT VIC AB 3-0 SH 27X BRD (SUTURE) ×2 IMPLANT
TOWEL GREEN STERILE (TOWEL DISPOSABLE) ×2 IMPLANT
UNDERPAD 30X30 (UNDERPADS AND DIAPERS) ×2 IMPLANT
WATER STERILE IRR 1000ML POUR (IV SOLUTION) ×2 IMPLANT

## 2017-12-31 NOTE — Anesthesia Preprocedure Evaluation (Addendum)
Anesthesia Evaluation  Patient identified by MRN, date of birth, ID band Patient awake    Reviewed: Allergy & Precautions, NPO status , Patient's Chart, lab work & pertinent test results  Airway Mallampati: II  TM Distance: >3 FB Neck ROM: Full    Dental no notable dental hx.    Pulmonary    Pulmonary exam normal breath sounds clear to auscultation       Cardiovascular hypertension, Pt. on medications Normal cardiovascular exam Rhythm:Regular Rate:Normal     Neuro/Psych TIACVA negative psych ROS   GI/Hepatic   Endo/Other  diabetes, Insulin DependentHypothyroidism   Renal/GU ESRFRenal disease     Musculoskeletal negative musculoskeletal ROS (+)   Abdominal   Peds  Hematology negative hematology ROS (+) anemia ,   Anesthesia Other Findings   Reproductive/Obstetrics negative OB ROS                             Anesthesia Physical  Anesthesia Plan  ASA: III  Anesthesia Plan: General   Post-op Pain Management:    Induction: Intravenous  PONV Risk Score and Plan: 3 and Treatment may vary due to age or medical condition  Airway Management Planned: LMA  Additional Equipment:   Intra-op Plan:   Post-operative Plan: Extubation in OR  Informed Consent: I have reviewed the patients History and Physical, chart, labs and discussed the procedure including the risks, benefits and alternatives for the proposed anesthesia with the patient or authorized representative who has indicated his/her understanding and acceptance.   Dental advisory given  Plan Discussed with: CRNA, Surgeon and Anesthesiologist  Anesthesia Plan Comments:        Anesthesia Quick Evaluation

## 2017-12-31 NOTE — Transfer of Care (Signed)
Immediate Anesthesia Transfer of Care Note  Patient: Suzanne Allen  Procedure(s) Performed: FIRST STAGE BASILIC VEIN TRANSPOSITION LEFT ARM (Left )  Patient Location: PACU  Anesthesia Type:General  Level of Consciousness: awake, alert , oriented and sedated  Airway & Oxygen Therapy: Patient Spontanous Breathing and Patient connected to nasal cannula oxygen  Post-op Assessment: Report given to RN, Post -op Vital signs reviewed and stable and Patient moving all extremities  Post vital signs: Reviewed and stable  Last Vitals:  Vitals Value Taken Time  BP 153/77 12/31/2017  1:06 PM  Temp 36.5 C 12/31/2017  1:06 PM  Pulse 90 12/31/2017  1:07 PM  Resp 16 12/31/2017  1:07 PM  SpO2 100 % 12/31/2017  1:07 PM  Vitals shown include unvalidated device data.  Last Pain: There were no vitals filed for this visit.    Patients Stated Pain Goal: 3 (58/83/25 4982)  Complications: No apparent anesthesia complications

## 2017-12-31 NOTE — Discharge Instructions (Signed)
° °  [facility] [place] ° °Discharge Instructions ° °AV Fistula or Graft Surgery for Dialysis Access ° °Please refer to the following instructions for your post-procedure care. Your surgeon or physician assistant will discuss any changes with you. ° °Activity ° °You may drive the day following your surgery, if you are comfortable and no longer taking prescription pain medication. Resume full activity as the soreness in your incision resolves. ° °Bathing/Showering ° °You may shower after you go home. Keep your incision dry for 48 hours. Do not soak in a bathtub, hot tub, or swim until the incision heals completely. You may not shower if you have a hemodialysis catheter. ° °Incision Care ° °Clean your incision with mild soap and water after 48 hours. Pat the area dry with a clean towel. You do not need a bandage unless otherwise instructed. Do not apply any ointments or creams to your incision. You may have skin glue on your incision. Do not peel it off. It will come off on its own in about one week. Your arm may swell a bit after surgery. To reduce swelling use pillows to elevate your arm so it is above your heart. Your doctor will tell you if you need to lightly wrap your arm with an ACE bandage. ° °Diet ° °Resume your normal diet. There are not special food restrictions following this procedure. In order to heal from your surgery, it is CRITICAL to get adequate nutrition. Your body requires vitamins, minerals, and protein. Vegetables are the best source of vitamins and minerals. Vegetables also provide the perfect balance of protein. Processed food has little nutritional value, so try to avoid this. ° °Medications ° °Resume taking all of your medications. If your incision is causing pain, you may take over-the counter pain relievers such as acetaminophen (Tylenol). If you were prescribed a stronger pain medication, please be aware these medications can cause nausea and constipation. Prevent  nausea by taking the medication with a snack or meal. Avoid constipation by drinking plenty of fluids and eating foods with high amount of fiber, such as fruits, vegetables, and grains. Do not take Tylenol if you are taking prescription pain medications. ° ° ° ° °Follow up °Your surgeon may want to see you in the office following your access surgery. If so, this will be arranged at the time of your surgery. ° °Please call us immediately for any of the following conditions: ° °Increased pain, redness, drainage (pus) from your incision site °Fever of 101 degrees or higher °Severe or worsening pain at your incision site °Hand pain or numbness. ° °Reduce your risk of vascular disease: ° °Stop smoking. If you would like help, call [facility] at 1-800-QUIT-NOW (1-800-784-8669) or [place] at 336-586-4000 ° °Manage your cholesterol °Maintain a desired weight °Control your diabetes °Keep your blood pressure down ° °Dialysis ° °It will take several weeks to several months for your new dialysis access to be ready for use. Your surgeon will determine when it is OK to use it. Your nephrologist will continue to direct your dialysis. You can continue to use your Permcath until your new access is ready for use. ° °If you have any questions, please call the office at [phone]. ° °

## 2017-12-31 NOTE — Telephone Encounter (Signed)
sch appt  Phone NA 02/09/18 1pm p/o PA

## 2017-12-31 NOTE — Anesthesia Postprocedure Evaluation (Signed)
Anesthesia Post Note  Patient: Suzanne Allen  Procedure(s) Performed: FIRST STAGE BASILIC VEIN TRANSPOSITION LEFT ARM (Left )     Patient location during evaluation: PACU Anesthesia Type: MAC Level of consciousness: awake and alert Pain management: pain level controlled Vital Signs Assessment: post-procedure vital signs reviewed and stable Respiratory status: spontaneous breathing, nonlabored ventilation, respiratory function stable and patient connected to nasal cannula oxygen Cardiovascular status: stable and blood pressure returned to baseline Postop Assessment: no apparent nausea or vomiting Anesthetic complications: no    Last Vitals:  Vitals:   12/31/17 1321 12/31/17 1335  BP:  (!) 146/70  Pulse: 93 94  Resp: 16 18  Temp:  36.5 C  SpO2: 96% 95%    Last Pain:  Vitals:   12/31/17 1335  PainSc: Asleep                 Correne Lalani

## 2017-12-31 NOTE — Op Note (Signed)
OPERATIVE NOTE   PROCEDURE: 1. left first stage basilic vein transposition (brachiobasilic arteriovenous fistula) placement  PRE-OPERATIVE DIAGNOSIS: chronic kidney disease stage V   POST-OPERATIVE DIAGNOSIS: same as above   SURGEON: Adele Barthel, MD  ASSISTANT(S): Laurence Slate, PAC   ANESTHESIA: general  ESTIMATED BLOOD LOSS: 50 cc  FINDING(S): 1.  Basilic vein: 3 mm, sclerotic valves 2.  Brachial artery: 3 mm, minimal disease 3.  Venous outflow: faintly palpable thrill  4.  Radial flow: dopplerable radial signal  SPECIMEN(S):  none  INDICATIONS:   Suzanne Allen is a 57 y.o. female who presents with chronic kidney disease stage V.  The patient is scheduled for left first stage basilic vein transposition.  The patient is aware the risks include but are not limited to: bleeding, infection, steal syndrome, nerve damage, ischemic monomelic neuropathy, failure to mature, and need for additional procedures.  The patient is aware of the risks of the procedure and elects to proceed forward.   DESCRIPTION: After full informed written consent was obtained from the patient, the patient was brought back to the operating room and placed supine upon the operating table.  Prior to induction, the patient received IV antibiotics.   After obtaining adequate anesthesia, the patient was then prepped and draped in the standard fashion for a left arm access procedure.  I turned my attention first to identifying the patient's basilic vein and brachial artery.    Using SonoSite guidance, the location of these vessels were marked out on the skin.   I made a transverse incision at the level of the antecubitum and dissected through the subcutaneous tissue and fascia to gain exposure of the brachial artery.  This was noted to be 3 mm in diameter externally.  This was dissected out proximally and distally and controlled with vessel loops.  I then dissected out the basilic vein.  This was noted to be 3 mm in  diameter externally.  The distal segment of the vein was ligated with a  2-0 silk, and the vein was transected.  The proximal segment was interrogated with serial dilators.  The vein accepted up to a 3 mm dilator without any difficulty.  I then instilled the heparinized saline into the vein and clamped it.  At this point, I reset my exposure of the brachial artery and placed the artery under tension proximally and distally.  I made an arteriotomy with a #11 blade, and then I extended the arteriotomy with a Potts scissor.  I injected heparinized saline proximal and distal to this arteriotomy.  I shortened the vein for this arteriotomy.  In this process, I found a sclerotic valve in the vein.  I transected this valve with Potts scissors.   The vein was then sewn to the artery in an end-to-side configuration with a running stitch of 7-0 Prolene.  Prior to completing this anastomosis, I allowed the vein and artery to backbleed.  There was no evidence of clot from any vessels.  I completed the anastomosis in the usual fashion and then released all vessel loops and clamps.    There was a faintly palpable thrill in the venous outflow, and there was a dopplerable radial signal and ulnar signal  At this point, I irrigated out the surgical wound.  There was no further active bleeding.  The subcutaneous tissue was reapproximated with a running stitch of 3-0 Vicryl.  The skin was then reapproximated with a running subcuticular stitch of 4-0 Vicryl.  The skin was then  cleaned, dried, and reinforced with Dermabond.  The patient tolerated this procedure well.    COMPLICATIONS: none  CONDITION: stable   Adele Barthel, MD, [facility] [place] Office: [phone] Pager: [phone]  12/31/2017, 12:50 PM

## 2017-12-31 NOTE — Interval H&P Note (Signed)
History and Physical Interval Note:  12/31/2017 10:52 AM  Suzanne Allen  has presented today for surgery, with the diagnosis of CHRONIC KIDNEY DISEASE V FOR HEMODIALYSIS ACCESS  The various methods of treatment have been discussed with the patient and family. After consideration of risks, benefits and other options for treatment, the patient has consented to  Procedure(s): FIRST STAGE BASILIC VEIN TRANSPOSITION LEFT ARM (Left) as a surgical intervention .  The patient's history has been reviewed, patient examined, no change in status, stable for surgery.  I have reviewed the patient's chart and labs.  Questions were answered to the patient's satisfaction.     Adele Barthel

## 2017-12-31 NOTE — Anesthesia Procedure Notes (Signed)
Procedure Name: LMA Insertion Date/Time: 12/31/2017 11:40 AM Performed by: Scheryl Darter, CRNA Pre-anesthesia Checklist: Patient identified, Emergency Drugs available, Suction available and Patient being monitored Patient Re-evaluated:Patient Re-evaluated prior to induction Oxygen Delivery Method: Circle System Utilized Preoxygenation: Pre-oxygenation with 100% oxygen Induction Type: IV induction Ventilation: Mask ventilation without difficulty LMA: LMA inserted LMA Size: 3.0 Number of attempts: 1 Placement Confirmation: positive ETCO2 Tube secured with: Tape Dental Injury: Teeth and Oropharynx as per pre-operative assessment

## 2018-01-01 ENCOUNTER — Encounter (HOSPITAL_COMMUNITY): Payer: Self-pay | Admitting: Vascular Surgery

## 2018-02-03 ENCOUNTER — Ambulatory Visit (HOSPITAL_COMMUNITY): Payer: Self-pay | Admitting: Psychiatry

## 2018-02-09 ENCOUNTER — Encounter: Payer: Self-pay | Admitting: *Deleted

## 2018-02-09 ENCOUNTER — Other Ambulatory Visit: Payer: Self-pay | Admitting: *Deleted

## 2018-02-09 ENCOUNTER — Ambulatory Visit (INDEPENDENT_AMBULATORY_CARE_PROVIDER_SITE_OTHER): Payer: Self-pay | Admitting: Physician Assistant

## 2018-02-09 VITALS — BP 191/89 | HR 68 | Temp 98.7°F | Resp 16 | Ht 63.0 in | Wt 166.6 lb

## 2018-02-09 DIAGNOSIS — N185 Chronic kidney disease, stage 5: Secondary | ICD-10-CM

## 2018-02-09 NOTE — Progress Notes (Signed)
POST OPERATIVE OFFICE NOTE    CC:  F/u for surgery  HPI:  This is a 57 y.o. female who is s/p first stage basilic fistula creation. Left UE 12/31/2017.  Patient denise pain, numbness or loss of motor in the left UE.  She has not had any changes in her medical history since her last visit.  She is not yet on HD.  She is here today for a f/u visit to evaluated the fistula and plan for second stage transposition.  She denise changes in her medical history since her last visit.  Allergies  Allergen Reactions  . Erythromycin Anaphylaxis  . Norvasc [Amlodipine] Shortness Of Breath and Swelling  . Vancomycin Shortness Of Breath and Other (See Comments)    Red Man Syndrome Has patient had a PCN reaction causing immediate rash, facial/tongue/throat swelling, SOB or lightheadedness with hypotension: unk Has patient had a PCN reaction causing severe rash involving mucus membranes or skin necrosis: unk Has patient had a PCN reaction that required hospitalization: unk Has patient had a PCN reaction occurring within the last 10 years: unk If all of the above answers are "NO", then may proceed with Cephalosporin use.    . Nsaids Other (See Comments)    Avoid due to CKD  . Wellbutrin [Bupropion] Other (See Comments)    Current Outpatient Medications  Medication Sig Dispense Refill  . calcium carbonate (TUMS EX) 750 MG chewable tablet Chew 1 tablet by mouth 3 (three) times daily. Calcium Supplement    . carvedilol (COREG) 12.5 MG tablet Take 6.25 mg by mouth 2 (two) times daily with a meal.    . cholecalciferol (VITAMIN D) 1000 units tablet Take 1,000 Units by mouth daily.    . clonazePAM (KLONOPIN) 1 MG tablet Take 1 tablet (1 mg total) by mouth at bedtime. 30 tablet 2  . Dorzolamide HCl-Timolol Mal (COSOPT OP) Place 1 drop into both eyes 2 (two) times daily.     . DULoxetine (CYMBALTA) 60 MG capsule Take 1 capsule (60 mg total) by mouth daily. 30 capsule 2  . furosemide (LASIX) 40 MG tablet Take  40 mg by mouth 2 (two) times daily.    Marland Kitchen glucose blood (CONTOUR NEXT TEST) test strip one strip (1 each total) by Other route 4 (four) times daily. Use as directed. Pharmacy, please dispense this brand of blood glucose test strips: Bayer Contour Next    . insulin glargine (LANTUS) 100 UNIT/ML injection Inject 40 Units into the skin 2 (two) times daily.    . insulin lispro (HUMALOG) 100 UNIT/ML injection Inject 20 Units into the skin See admin instructions. Inject up to 25 units subcutaneously three times daily before meals if needed Per sliding scale as directed    . levothyroxine (SYNTHROID, LEVOTHROID) 150 MCG tablet Take 150 mcg by mouth daily before breakfast.    . lisinopril (PRINIVIL,ZESTRIL) 20 MG tablet Take 20 mg by mouth daily.   5  . Omega-3 1000 MG CAPS Take 1 capsule by mouth daily.    . rosuvastatin (CRESTOR) 40 MG tablet Take 40 mg by mouth daily.    . vitamin C (ASCORBIC ACID) 500 MG tablet Take 500 mg by mouth daily.    Marland Kitchen oxyCODONE-acetaminophen (PERCOCET/ROXICET) 5-325 MG tablet Take 1 tablet by mouth every 6 (six) hours as needed. (Patient not taking: Reported on 02/09/2018) 6 tablet 0  . sevelamer carbonate (RENVELA) 800 MG tablet Take 1 tablet (800 mg total) by mouth 3 (three) times daily with meals. Avoid taking  with levothyroxine (Patient not taking: Reported on 12/27/2017) 90 tablet 0   No current facility-administered medications for this visit.      ROS:  See HPI  Physical Exam:  Vitals:   02/09/18 1306  BP: (!) 191/89  Pulse: 68  Resp: 16  Temp: 98.7 F (37.1 C)  SpO2: 99%    Incision:  Well healed incision Extremities:   Sensation intact, motor intact and palpable radial pulse left UE.   Heart: RRR Abdomen:  Soft NTTP, + BS  Fistula duplex:pending   Assessment/Plan:  This is a 57 y.o. female who is s/p: S/P first stage left UE basilic fistula creation.  Plan: I have ordered a fistula duplex to be performed.  I will schedule her for second stage  basilic transposition in 2 weeks pending the fistula study.  The patient is in agreement with this plan.  I asked her to contact her PCP in regards to her HTN for further work up.     Roxy Horseman , PA-C [facility] [phone]

## 2018-02-10 ENCOUNTER — Other Ambulatory Visit: Payer: Self-pay

## 2018-02-10 DIAGNOSIS — N185 Chronic kidney disease, stage 5: Secondary | ICD-10-CM

## 2018-02-11 ENCOUNTER — Ambulatory Visit (HOSPITAL_COMMUNITY)
Admission: RE | Admit: 2018-02-11 | Discharge: 2018-02-11 | Disposition: A | Discharge status: Home / Self Care | Payer: Medicaid Other | Source: Ambulatory Visit | Attending: Family | Admitting: Family

## 2018-02-11 DIAGNOSIS — Z48812 Encounter for surgical aftercare following surgery on the circulatory system: Secondary | ICD-10-CM | POA: Diagnosis present

## 2018-02-11 DIAGNOSIS — N185 Chronic kidney disease, stage 5: Secondary | ICD-10-CM | POA: Diagnosis not present

## 2018-02-18 NOTE — Progress Notes (Signed)
I called Suzanne Allen a couple of times, when I reached her she was driving and would not be stopped for over 2 hours.  I asked if I could leave instructions on voice mail and she said yes. I instructed patient to take 1/2 of Lantus Sunday evening and Monday AM- if CBG > 70 on Monday am. I instructed patient to check CBG after awaking and every 2 hours until arrival  to the hospital.  I Instructed patient if CBG is less than 70 to take 4 Glucose Tablets or 1 tube of Glucose Gel or 1/2 cup of a clear juice. Recheck CBG in 15 minutes then call pre- op desk at [phone] for further instructions. If scheduled to receive Insulin, do not take Insulin.  I instructed the patient to arrive at [facility] entrance at 7:15 AM  , nothing to eat or drink after midnight.   I instructed the patient to take the following medications in the am with just enough water to get them down: Coreg, Cymbalta, Eye drops, Levothyroxine, Rovastin. I asked patient to not wear any lotions, powders, cologne, jewelry, piercing, make-up or nail polish.  I asked the patient to call [phone], in the am if there were any questions or problems. I informed patient that she would need someone to drive her home and to stay with her for 24 hours after surgery.

## 2018-02-20 NOTE — Anesthesia Preprocedure Evaluation (Signed)
Anesthesia Evaluation  Patient identified by MRN, date of birth, ID band Patient awake    Reviewed: Allergy & Precautions, NPO status , Patient's Chart, lab work & pertinent test results  Airway Mallampati: II  TM Distance: >3 FB Neck ROM: Full    Dental no notable dental hx.    Pulmonary    Pulmonary exam normal breath sounds clear to auscultation       Cardiovascular hypertension, Pt. on medications Normal cardiovascular exam Rhythm:Regular Rate:Normal  09/2017 Echo: Normal EF. Valves okay   Neuro/Psych TIACVA negative psych ROS   GI/Hepatic   Endo/Other  diabetes, Type 1, Insulin DependentHypothyroidism   Renal/GU ESRFRenal disease     Musculoskeletal negative musculoskeletal ROS (+)   Abdominal   Peds  Hematology negative hematology ROS (+) anemia ,   Anesthesia Other Findings   Reproductive/Obstetrics negative OB ROS                             Anesthesia Physical  Anesthesia Plan  ASA: III  Anesthesia Plan: General   Post-op Pain Management:    Induction: Intravenous  PONV Risk Score and Plan: 3 and Treatment may vary due to age or medical condition  Airway Management Planned: LMA  Additional Equipment:   Intra-op Plan:   Post-operative Plan: Extubation in OR  Informed Consent: I have reviewed the patients History and Physical, chart, labs and discussed the procedure including the risks, benefits and alternatives for the proposed anesthesia with the patient or authorized representative who has indicated his/her understanding and acceptance.   Dental advisory given  Plan Discussed with: CRNA, Surgeon and Anesthesiologist  Anesthesia Plan Comments:         Anesthesia Quick Evaluation

## 2018-02-21 ENCOUNTER — Ambulatory Visit (HOSPITAL_COMMUNITY): Payer: Medicaid Other | Admitting: Anesthesiology

## 2018-02-21 ENCOUNTER — Encounter (HOSPITAL_COMMUNITY): Payer: Self-pay | Admitting: Certified Registered"

## 2018-02-21 ENCOUNTER — Ambulatory Visit (HOSPITAL_COMMUNITY)
Admission: RE | Admit: 2018-02-21 | Discharge: 2018-02-21 | Disposition: A | Discharge status: Home / Self Care | Payer: Medicaid Other | Source: Ambulatory Visit | Attending: Vascular Surgery | Admitting: Vascular Surgery

## 2018-02-21 ENCOUNTER — Encounter (HOSPITAL_COMMUNITY)
Admission: RE | Disposition: A | Discharge status: Home / Self Care | Payer: Self-pay | Source: Ambulatory Visit | Attending: Vascular Surgery

## 2018-02-21 DIAGNOSIS — D631 Anemia in chronic kidney disease: Secondary | ICD-10-CM | POA: Diagnosis not present

## 2018-02-21 DIAGNOSIS — I120 Hypertensive chronic kidney disease with stage 5 chronic kidney disease or end stage renal disease: Secondary | ICD-10-CM | POA: Insufficient documentation

## 2018-02-21 DIAGNOSIS — Z8673 Personal history of transient ischemic attack (TIA), and cerebral infarction without residual deficits: Secondary | ICD-10-CM | POA: Insufficient documentation

## 2018-02-21 DIAGNOSIS — E039 Hypothyroidism, unspecified: Secondary | ICD-10-CM | POA: Diagnosis not present

## 2018-02-21 DIAGNOSIS — Z79899 Other long term (current) drug therapy: Secondary | ICD-10-CM | POA: Diagnosis not present

## 2018-02-21 DIAGNOSIS — N186 End stage renal disease: Secondary | ICD-10-CM | POA: Insufficient documentation

## 2018-02-21 DIAGNOSIS — E1022 Type 1 diabetes mellitus with diabetic chronic kidney disease: Secondary | ICD-10-CM | POA: Diagnosis not present

## 2018-02-21 DIAGNOSIS — Z79890 Hormone replacement therapy (postmenopausal): Secondary | ICD-10-CM | POA: Insufficient documentation

## 2018-02-21 DIAGNOSIS — Z794 Long term (current) use of insulin: Secondary | ICD-10-CM | POA: Insufficient documentation

## 2018-02-21 DIAGNOSIS — N185 Chronic kidney disease, stage 5: Secondary | ICD-10-CM

## 2018-02-21 HISTORY — PX: BASCILIC VEIN TRANSPOSITION: SHX5742

## 2018-02-21 LAB — POCT I-STAT 4, (NA,K, GLUC, HGB,HCT)
Glucose, Bld: 160 mg/dL — ABNORMAL HIGH (ref 70–99)
HCT: 25 % — ABNORMAL LOW (ref 36.0–46.0)
Hemoglobin: 8.5 g/dL — ABNORMAL LOW (ref 12.0–15.0)
Potassium: 4 mmol/L (ref 3.5–5.1)
Sodium: 139 mmol/L (ref 135–145)

## 2018-02-21 LAB — GLUCOSE, CAPILLARY: Glucose-Capillary: 190 mg/dL — ABNORMAL HIGH (ref 70–99)

## 2018-02-21 SURGERY — TRANSPOSITION, VEIN, BASILIC
Anesthesia: General | Site: Arm Upper | Laterality: Left

## 2018-02-21 MED ORDER — SODIUM CHLORIDE 0.9 % IV SOLN
INTRAVENOUS | Status: DC | PRN
  Administered 2018-02-21: 500 mL

## 2018-02-21 MED ORDER — FENTANYL CITRATE (PF) 100 MCG/2ML IJ SOLN
25.0000 ug | INTRAMUSCULAR | Status: DC | PRN
  Administered 2018-02-21 (×2): 50 ug via INTRAVENOUS

## 2018-02-21 MED ORDER — PROPOFOL 10 MG/ML IV BOLUS
INTRAVENOUS | Status: DC | PRN
  Administered 2018-02-21: 150 mg via INTRAVENOUS
  Administered 2018-02-21: 50 mg via INTRAVENOUS

## 2018-02-21 MED ORDER — SODIUM CHLORIDE 0.9 % IV SOLN
INTRAVENOUS | Status: DC
  Administered 2018-02-21 (×2): via INTRAVENOUS

## 2018-02-21 MED ORDER — SODIUM CHLORIDE 0.9 % IV SOLN
INTRAVENOUS | Status: AC
  Filled 2018-02-21: qty 1.2

## 2018-02-21 MED ORDER — DEXAMETHASONE SODIUM PHOSPHATE 10 MG/ML IJ SOLN
INTRAMUSCULAR | Status: DC | PRN
  Administered 2018-02-21: 10 mg via INTRAVENOUS

## 2018-02-21 MED ORDER — HEPARIN SODIUM (PORCINE) 1000 UNIT/ML IJ SOLN
INTRAMUSCULAR | Status: AC
  Filled 2018-02-21: qty 1

## 2018-02-21 MED ORDER — 0.9 % SODIUM CHLORIDE (POUR BTL) OPTIME
TOPICAL | Status: DC | PRN
  Administered 2018-02-21: 1000 mL

## 2018-02-21 MED ORDER — OXYCODONE-ACETAMINOPHEN 5-325 MG PO TABS: 1.0000 | ORAL_TABLET | Freq: Four times a day (QID) | ORAL | 0 refills | Status: DC | PRN

## 2018-02-21 MED ORDER — ONDANSETRON HCL 4 MG/2ML IJ SOLN
INTRAMUSCULAR | Status: AC
  Filled 2018-02-21: qty 2

## 2018-02-21 MED ORDER — CEFAZOLIN SODIUM-DEXTROSE 2-4 GM/100ML-% IV SOLN
2.0000 g | INTRAVENOUS | Status: AC
  Administered 2018-02-21: 2 g via INTRAVENOUS
  Filled 2018-02-21: qty 100

## 2018-02-21 MED ORDER — PROPOFOL 10 MG/ML IV BOLUS
INTRAVENOUS | Status: AC
  Filled 2018-02-21: qty 20

## 2018-02-21 MED ORDER — MIDAZOLAM HCL 2 MG/2ML IJ SOLN
INTRAMUSCULAR | Status: AC
  Filled 2018-02-21: qty 2

## 2018-02-21 MED ORDER — LIDOCAINE-EPINEPHRINE (PF) 1 %-1:200000 IJ SOLN
INTRAMUSCULAR | Status: AC
  Filled 2018-02-21: qty 30

## 2018-02-21 MED ORDER — SODIUM CHLORIDE 0.9 % IV SOLN
INTRAVENOUS | Status: DC | PRN
  Administered 2018-02-21: 30 ug/min via INTRAVENOUS

## 2018-02-21 MED ORDER — FENTANYL CITRATE (PF) 250 MCG/5ML IJ SOLN
INTRAMUSCULAR | Status: DC | PRN
  Administered 2018-02-21 (×6): 25 ug via INTRAVENOUS

## 2018-02-21 MED ORDER — LIDOCAINE 2% (20 MG/ML) 5 ML SYRINGE
INTRAMUSCULAR | Status: DC | PRN
  Administered 2018-02-21: 50 mg via INTRAVENOUS

## 2018-02-21 MED ORDER — HEPARIN SODIUM (PORCINE) 1000 UNIT/ML IJ SOLN
INTRAMUSCULAR | Status: DC | PRN
  Administered 2018-02-21: 2000 [IU] via INTRAVENOUS

## 2018-02-21 MED ORDER — FENTANYL CITRATE (PF) 250 MCG/5ML IJ SOLN
INTRAMUSCULAR | Status: AC
  Filled 2018-02-21: qty 5

## 2018-02-21 MED ORDER — PHENYLEPHRINE 40 MCG/ML (10ML) SYRINGE FOR IV PUSH (FOR BLOOD PRESSURE SUPPORT)
PREFILLED_SYRINGE | INTRAVENOUS | Status: DC | PRN
  Administered 2018-02-21: 40 ug via INTRAVENOUS
  Administered 2018-02-21 (×2): 80 ug via INTRAVENOUS

## 2018-02-21 MED ORDER — PROTAMINE SULFATE 10 MG/ML IV SOLN
INTRAVENOUS | Status: AC
  Filled 2018-02-21: qty 5

## 2018-02-21 MED ORDER — PROPOFOL 10 MG/ML IV BOLUS
INTRAVENOUS | Status: AC
Start: 2018-02-21 — End: ?
  Filled 2018-02-21: qty 20

## 2018-02-21 MED ORDER — ONDANSETRON HCL 4 MG/2ML IJ SOLN
INTRAMUSCULAR | Status: DC | PRN
  Administered 2018-02-21: 4 mg via INTRAVENOUS

## 2018-02-21 MED ORDER — MIDAZOLAM HCL 2 MG/2ML IJ SOLN
INTRAMUSCULAR | Status: DC | PRN
  Administered 2018-02-21: 2 mg via INTRAVENOUS

## 2018-02-21 MED ORDER — FENTANYL CITRATE (PF) 100 MCG/2ML IJ SOLN
INTRAMUSCULAR | Status: DC
Start: 2018-02-21 — End: 2018-02-21
  Filled 2018-02-21: qty 2

## 2018-02-21 SURGICAL SUPPLY — 42 items
ARMBAND PINK RESTRICT EXTREMIT (MISCELLANEOUS) ×2 IMPLANT
CANISTER SUCT 3000ML PPV (MISCELLANEOUS) ×2 IMPLANT
CLIP VESOCCLUDE MED 24/CT (CLIP) ×2 IMPLANT
CLIP VESOCCLUDE SM WIDE 24/CT (CLIP) ×2 IMPLANT
CORDS BIPOLAR (ELECTRODE) IMPLANT
COVER PROBE W GEL 5X96 (DRAPES) ×2 IMPLANT
DECANTER SPIKE VIAL GLASS SM (MISCELLANEOUS) ×2 IMPLANT
DERMABOND ADHESIVE PROPEN (GAUZE/BANDAGES/DRESSINGS) ×1
DERMABOND ADVANCED (GAUZE/BANDAGES/DRESSINGS) ×1
DERMABOND ADVANCED .7 DNX12 (GAUZE/BANDAGES/DRESSINGS) ×1 IMPLANT
DERMABOND ADVANCED .7 DNX6 (GAUZE/BANDAGES/DRESSINGS) ×1 IMPLANT
ELECT REM PT RETURN 9FT ADLT (ELECTROSURGICAL) ×2
ELECTRODE REM PT RTRN 9FT ADLT (ELECTROSURGICAL) ×1 IMPLANT
GLOVE BIO SURGEON STRL SZ7 (GLOVE) IMPLANT
GLOVE BIO SURGEON STRL SZ7.5 (GLOVE) ×2 IMPLANT
GLOVE BIOGEL PI IND STRL 7.5 (GLOVE) IMPLANT
GLOVE BIOGEL PI IND STRL 8 (GLOVE) ×1 IMPLANT
GLOVE BIOGEL PI INDICATOR 7.5 (GLOVE)
GLOVE BIOGEL PI INDICATOR 8 (GLOVE) ×1
GLOVE ECLIPSE 6.5 STRL STRAW (GLOVE) ×2 IMPLANT
GOWN STRL REUS W/ TWL LRG LVL3 (GOWN DISPOSABLE) ×3 IMPLANT
GOWN STRL REUS W/TWL LRG LVL3 (GOWN DISPOSABLE) ×3
HEMOSTAT SPONGE AVITENE ULTRA (HEMOSTASIS) IMPLANT
KIT BASIN OR (CUSTOM PROCEDURE TRAY) ×2 IMPLANT
KIT TURNOVER KIT B (KITS) ×2 IMPLANT
NEEDLE HYPO 25GX1X1/2 BEV (NEEDLE) IMPLANT
NS IRRIG 1000ML POUR BTL (IV SOLUTION) ×2 IMPLANT
PACK CV ACCESS (CUSTOM PROCEDURE TRAY) ×2 IMPLANT
PAD ARMBOARD 7.5X6 YLW CONV (MISCELLANEOUS) ×4 IMPLANT
SUT MNCRL AB 4-0 PS2 18 (SUTURE) ×4 IMPLANT
SUT PROLENE 6 0 BV (SUTURE) ×6 IMPLANT
SUT PROLENE 7 0 BV 1 (SUTURE) IMPLANT
SUT SILK 2 0 SH (SUTURE) ×6 IMPLANT
SUT SILK 3 0 (SUTURE) ×1
SUT SILK 3-0 18XBRD TIE 12 (SUTURE) ×1 IMPLANT
SUT VIC AB 2-0 CT1 27 (SUTURE)
SUT VIC AB 2-0 CT1 TAPERPNT 27 (SUTURE) IMPLANT
SUT VIC AB 3-0 SH 27 (SUTURE) ×4
SUT VIC AB 3-0 SH 27X BRD (SUTURE) ×4 IMPLANT
TOWEL GREEN STERILE (TOWEL DISPOSABLE) ×2 IMPLANT
UNDERPAD 30X30 (UNDERPADS AND DIAPERS) ×2 IMPLANT
WATER STERILE IRR 1000ML POUR (IV SOLUTION) ×2 IMPLANT

## 2018-02-21 NOTE — H&P (Signed)
History and Physical Interval Note:  02/21/2018 9:32 AM  Suzanne Allen  has presented today for surgery, with the diagnosis of chronic kidney disease  The various methods of treatment have been discussed with the patient and family. After consideration of risks, benefits and other options for treatment, the patient has consented to  Procedure(s): BASILIC VEIN TRANSPOSITION SECOND STAGE (Left) as a surgical intervention .  The patient's history has been reviewed, patient examined, no change in status, stable for surgery.  I have reviewed the patient's chart and labs.  Questions were answered to the patient's satisfaction.     Still not on a dialysis.  Plan for 2nd stage LUE brachiobasilic fistula today.  Good thrill and vein has dilated nicely.  Marty Heck          Show:Clear all [x] Manual[x] Template[] Copied  Added by: [x] Theda Sers, Susette Racer, PA-C  [] Hover for details  POST OPERATIVE OFFICE NOTE    CC:  F/u for surgery  HPI:  This is a 57 y.o. female who is s/p first stage basilic fistula creation. Left UE 12/31/2017.  Patient denise pain, numbness or loss of motor in the left UE.  She has not had any changes in her medical history since her last visit.  She is not yet on HD.  She is here today for a f/u visit to evaluated the fistula and plan for second stage transposition.             She denise changes in her medical history since her last visit.       Allergies  Allergen Reactions  . Erythromycin Anaphylaxis  . Norvasc [Amlodipine] Shortness Of Breath and Swelling  . Vancomycin Shortness Of Breath and Other (See Comments)    Red Man Syndrome Has patient had a PCN reaction causing immediate rash, facial/tongue/throat swelling, SOB or lightheadedness with hypotension: unk Has patient had a PCN reaction causing severe rash involving mucus membranes or skin necrosis: unk Has patient had a PCN reaction that required hospitalization: unk Has patient had a PCN  reaction occurring within the last 10 years: unk If all of the above answers are "NO", then may proceed with Cephalosporin use.    . Nsaids Other (See Comments)    Avoid due to CKD  . Wellbutrin [Bupropion] Other (See Comments)          Current Outpatient Medications  Medication Sig Dispense Refill  . calcium carbonate (TUMS EX) 750 MG chewable tablet Chew 1 tablet by mouth 3 (three) times daily. Calcium Supplement    . carvedilol (COREG) 12.5 MG tablet Take 6.25 mg by mouth 2 (two) times daily with a meal.    . cholecalciferol (VITAMIN D) 1000 units tablet Take 1,000 Units by mouth daily.    . clonazePAM (KLONOPIN) 1 MG tablet Take 1 tablet (1 mg total) by mouth at bedtime. 30 tablet 2  . Dorzolamide HCl-Timolol Mal (COSOPT OP) Place 1 drop into both eyes 2 (two) times daily.     . DULoxetine (CYMBALTA) 60 MG capsule Take 1 capsule (60 mg total) by mouth daily. 30 capsule 2  . furosemide (LASIX) 40 MG tablet Take 40 mg by mouth 2 (two) times daily.    Marland Kitchen glucose blood (CONTOUR NEXT TEST) test strip one strip (1 each total) by Other route 4 (four) times daily. Use as directed. Pharmacy, please dispense this brand of blood glucose test strips: Bayer Contour Next    . insulin glargine (LANTUS) 100 UNIT/ML injection Inject 40 Units  into the skin 2 (two) times daily.    . insulin lispro (HUMALOG) 100 UNIT/ML injection Inject 20 Units into the skin See admin instructions. Inject up to 25 units subcutaneously three times daily before meals if needed Per sliding scale as directed    . levothyroxine (SYNTHROID, LEVOTHROID) 150 MCG tablet Take 150 mcg by mouth daily before breakfast.    . lisinopril (PRINIVIL,ZESTRIL) 20 MG tablet Take 20 mg by mouth daily.   5  . Omega-3 1000 MG CAPS Take 1 capsule by mouth daily.    . rosuvastatin (CRESTOR) 40 MG tablet Take 40 mg by mouth daily.    . vitamin C (ASCORBIC ACID) 500 MG tablet Take 500 mg by mouth daily.    Marland Kitchen  oxyCODONE-acetaminophen (PERCOCET/ROXICET) 5-325 MG tablet Take 1 tablet by mouth every 6 (six) hours as needed. (Patient not taking: Reported on 02/09/2018) 6 tablet 0  . sevelamer carbonate (RENVELA) 800 MG tablet Take 1 tablet (800 mg total) by mouth 3 (three) times daily with meals. Avoid taking with levothyroxine (Patient not taking: Reported on 12/27/2017) 90 tablet 0   No current facility-administered medications for this visit.      ROS:  See HPI  Physical Exam:     Vitals:   02/09/18 1306  BP: (!) 191/89  Pulse: 68  Resp: 16  Temp: 98.7 F (37.1 C)  SpO2: 99%    Incision:  Well healed incision Extremities:   Sensation intact, motor intact and palpable radial pulse left UE.   Heart: RRR Abdomen:  Soft NTTP, + BS  Fistula duplex:pending   Assessment/Plan:  This is a 57 y.o. female who is s/p: S/P first stage left UE basilic fistula creation.  Plan: I have ordered a fistula duplex to be performed.  I will schedule her for second stage basilic transposition in 2 weeks pending the fistula study.  The patient is in agreement with this plan.  I asked her to contact her PCP in regards to her HTN for further work up.     Roxy Horseman , PA-C [facility] [phone]

## 2018-02-21 NOTE — Discharge Instructions (Signed)
° °  [facility] ° °Discharge Instructions ° °AV Fistula or Graft Surgery for Dialysis Access ° °Please refer to the following instructions for your post-procedure care. Your surgeon or physician assistant will discuss any changes with you. ° °Activity ° °You may drive the day following your surgery, if you are comfortable and no longer taking prescription pain medication. Resume full activity as the soreness in your incision resolves. ° °Bathing/Showering ° °You may shower after you go home. Keep your incision dry for 48 hours. Do not soak in a bathtub, hot tub, or swim until the incision heals completely. You may not shower if you have a hemodialysis catheter. ° °Incision Care ° °Clean your incision with mild soap and water after 48 hours. Pat the area dry with a clean towel. You do not need a bandage unless otherwise instructed. Do not apply any ointments or creams to your incision. You may have skin glue on your incision. Do not peel it off. It will come off on its own in about one week. Your arm may swell a bit after surgery. To reduce swelling use pillows to elevate your arm so it is above your heart. Your doctor will tell you if you need to lightly wrap your arm with an ACE bandage. ° °Diet ° °Resume your normal diet. There are not special food restrictions following this procedure. In order to heal from your surgery, it is CRITICAL to get adequate nutrition. Your body requires vitamins, minerals, and protein. Vegetables are the best source of vitamins and minerals. Vegetables also provide the perfect balance of protein. Processed food has little nutritional value, so try to avoid this. ° °Medications ° °Resume taking all of your medications. If your incision is causing pain, you may take over-the counter pain relievers such as acetaminophen (Tylenol). If you were prescribed a stronger pain medication, please be aware these medications can cause nausea and constipation. Prevent  nausea by taking the medication with a snack or meal. Avoid constipation by drinking plenty of fluids and eating foods with high amount of fiber, such as fruits, vegetables, and grains. Do not take Tylenol if you are taking prescription pain medications. ° ° ° ° °Follow up °Your surgeon may want to see you in the office following your access surgery. If so, this will be arranged at the time of your surgery. ° °Please call us immediately for any of the following conditions: ° °Increased pain, redness, drainage (pus) from your incision site °Fever of 101 degrees or higher °Severe or worsening pain at your incision site °Hand pain or numbness. ° °Reduce your risk of vascular disease: ° °Stop smoking. If you would like help, call [facility] at 1-800-QUIT-NOW (1-800-784-8669) or [place] at 336-586-4000 ° °Manage your cholesterol °Maintain a desired weight °Control your diabetes °Keep your blood pressure down ° °Dialysis ° °It will take several weeks to several months for your new dialysis access to be ready for use. Your surgeon will determine when it is OK to use it. Your nephrologist will continue to direct your dialysis. You can continue to use your Permcath until your new access is ready for use. ° °If you have any questions, please call the office at [phone]. ° °

## 2018-02-21 NOTE — Anesthesia Postprocedure Evaluation (Signed)
Anesthesia Post Note  Patient: Suzanne Allen  Procedure(s) Performed: SECOND STAGE BASILIC VEIN TRANSPOSITION, LEFT UPPER ARM (Left Arm Upper)     Patient location during evaluation: PACU Anesthesia Type: General Level of consciousness: awake and alert Pain management: pain level controlled Vital Signs Assessment: post-procedure vital signs reviewed and stable Respiratory status: spontaneous breathing, nonlabored ventilation, respiratory function stable and patient connected to nasal cannula oxygen Cardiovascular status: blood pressure returned to baseline and stable Postop Assessment: no apparent nausea or vomiting Anesthetic complications: no    Last Vitals:  Vitals:   02/21/18 1345 02/21/18 1415  BP: 139/74 (!) 146/91  Pulse: 85 86  Resp: 17 16  Temp: (!) 36.4 C   SpO2: 95% 98%    Last Pain:  Vitals:   02/21/18 1415  TempSrc:   PainSc: 0-No pain                 Tiajuana Amass

## 2018-02-21 NOTE — Op Note (Signed)
    OPERATIVE NOTE   PROCEDURE: Left second stage basilic vein transposition (brachiobasilic arteriovenous fistula) placement  PRE-OPERATIVE DIAGNOSIS: Chronic Kidney Disease  POST-OPERATIVE DIAGNOSIS: Chronic Kidney Disease  SURGEON: Marty Heck, MD  ASSISTANT(S): Arlee Muslim, PA  ANESTHESIA: LMA  ESTIMATED BLOOD LOSS: 50 cc  FINDING(S): Nicely dilated basilic vein >2-5 mm.  Vein was extremely deep and difficult to mobilize enough length for transposition.  Good thrill at completion of the case.  SPECIMEN(S):  None  INDICATIONS:   Suzanne Allen is a 57 y.o. female who presents with CKD and future need for hemodialysis.  The patient is scheduled for left second stage basilic vein transposition.  The patient is aware the risks include but are not limited to: bleeding, infection, steal syndrome, nerve damage, ischemic monomelic neuropathy, failure to mature, and need for additional procedures.  The patient is aware of the risks of the procedure and elects to proceed forward.   DESCRIPTION: After full informed written consent was obtained from the patient, the patient was brought back to the operating room and placed supine upon the operating table.  Prior to induction, the patient received IV antibiotics.   After obtaining adequate anesthesia, the patient was then prepped and draped in the standard fashion for a left arm access procedure.  I turned my attention first to identifying the patient's brachiobasilic arteriovenous fistula.  Using SonoSite guidance, the location of this fistula was marked out on the skin.    This was an excellent caliber vein.  I made 2 longitudinal incisions on the medial aspect of the left upper arm.  Through these incisions I dissected out circumferentially the basilic vein, taking care to protect the nerve.  Once the vein was fully mobilized, all side branches were ligated between silk ties and small clips.  The vein was marked for orientation.  I  then used a curved tunneler to create a subcutaneous tunnel laterally on the arm that was approximately 6 mm deep.  The vein was then transected near the antecubital crease.  Both ends of the vein were flushed with heparinized saline.  It was then brought to the previously created tunnel making sure to maintain proper orientation.  A primary anastomosis was then performed between the two cut ends of the vein after it was spatulated with a running anastomosis using a 6-0 Prolene at 12 o'clock and 6 o'clock.  Once this was done the clamps were released.  There was excellent flow through the fistula.  Hemostasis was then achieved.  The wound was irrigated.  The upper incision was closed with two deep layers of 3-0 Vicryl and the lower incision was closed with a deep layer of 3-0 Vicryl followed.  Both incisions were then run closed with 4-0 Monocryl and Dermabond.  There were no immediate complications.  COMPLICATIONS: None  CONDITION: Stable  Marty Heck, MD [facility] Office: [phone] Pager: [phone]  02/21/2018, 12:21 PM

## 2018-02-21 NOTE — Anesthesia Procedure Notes (Signed)
Procedure Name: LMA Insertion Date/Time: 02/21/2018 9:49 AM Performed by: Imagene Riches, CRNA Pre-anesthesia Checklist: Patient identified, Emergency Drugs available, Suction available and Patient being monitored Patient Re-evaluated:Patient Re-evaluated prior to induction Oxygen Delivery Method: Circle System Utilized Preoxygenation: Pre-oxygenation with 100% oxygen Induction Type: IV induction Ventilation: Mask ventilation without difficulty LMA: LMA inserted LMA Size: 4.0 Number of attempts: 1 Airway Equipment and Method: Bite block Placement Confirmation: positive ETCO2 Tube secured with: Tape Dental Injury: Teeth and Oropharynx as per pre-operative assessment

## 2018-02-21 NOTE — Transfer of Care (Signed)
Immediate Anesthesia Transfer of Care Note  Patient: Suzanne Allen  Procedure(s) Performed: SECOND STAGE BASILIC VEIN TRANSPOSITION, LEFT UPPER ARM (Left Arm Upper)  Patient Location: PACU  Anesthesia Type:General  Level of Consciousness: drowsy  Airway & Oxygen Therapy: Patient Spontanous Breathing and Patient connected to nasal cannula oxygen  Post-op Assessment: Report given to RN and Post -op Vital signs reviewed and stable  Post vital signs: Reviewed and stable  Last Vitals:  Vitals Value Taken Time  BP 170/79 02/21/2018 12:46 PM  Temp    Pulse 89 02/21/2018 12:46 PM  Resp 15 02/21/2018 12:46 PM  SpO2 100 % 02/21/2018 12:46 PM  Vitals shown include unvalidated device data.  Last Pain:  Vitals:   02/21/18 0751  TempSrc:   PainSc: 0-No pain      Patients Stated Pain Goal: 6 (10/14/57 1368)  Complications: No apparent anesthesia complications

## 2018-02-22 ENCOUNTER — Ambulatory Visit (INDEPENDENT_AMBULATORY_CARE_PROVIDER_SITE_OTHER): Payer: Medicaid Other | Admitting: Psychiatry

## 2018-02-22 ENCOUNTER — Encounter (HOSPITAL_COMMUNITY): Payer: Self-pay | Admitting: Vascular Surgery

## 2018-02-22 ENCOUNTER — Telehealth: Payer: Self-pay | Admitting: Vascular Surgery

## 2018-02-22 VITALS — BP 157/80 | HR 78 | Ht 63.0 in | Wt 170.0 lb

## 2018-02-22 DIAGNOSIS — Z811 Family history of alcohol abuse and dependence: Secondary | ICD-10-CM | POA: Diagnosis not present

## 2018-02-22 DIAGNOSIS — Z818 Family history of other mental and behavioral disorders: Secondary | ICD-10-CM

## 2018-02-22 DIAGNOSIS — F330 Major depressive disorder, recurrent, mild: Secondary | ICD-10-CM | POA: Diagnosis not present

## 2018-02-22 MED ORDER — CLONAZEPAM 0.5 MG PO TABS: 0.5000 mg | ORAL_TABLET | Freq: Every day | ORAL | 0 refills | Status: DC | PRN

## 2018-02-22 MED ORDER — DULOXETINE HCL 60 MG PO CPEP: 60.0000 mg | ORAL_CAPSULE | Freq: Every day | ORAL | 2 refills | Status: DC

## 2018-02-22 MED ORDER — CLONAZEPAM 1 MG PO TABS: 1.0000 mg | ORAL_TABLET | Freq: Every day | ORAL | 2 refills | Status: DC

## 2018-02-22 NOTE — Progress Notes (Signed)
[place] MD/PA/NP OP Progress Note  02/22/2018 4:23 PM Suzanne Allen  MRN:  [id number]  Chief Complaint: med management  HPI: Suzanne Allen presents for cross coverage follow-up.  Has some ongoing anxiety breakthrough during the day.  We spent time discussing use of clonazepam as needed in the daytime.  She is contending with ongoing severe medical issues, and likely going to be starting dialysis in the next 6-8 weeks.  I educated her on the risks of benzodiazepines and ESRD, and cautioned her to avoid this if she is feeling sedated or confused.  Spent time processing some of her grief related to her concerns about her daughter who suffers with ADD, and I provided her some information on resources in the community.  Visit Diagnosis:    ICD-10-CM   1. Major depressive disorder, recurrent episode, mild (HCC) F33.0 clonazePAM (KLONOPIN) 0.5 MG tablet    DULoxetine (CYMBALTA) 60 MG capsule    clonazePAM (KLONOPIN) 1 MG tablet   Past Psychiatric History: See intake H&P for full details. Reviewed, with no updates at this time.  Past Medical History:  Past Medical History:  Diagnosis Date  . 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 Anemia   . Anxiety   . CAP (community acquired pneumonia) 03/13/2015  . Carpal tunnel syndrome 11/28/2014   Bilateral  . Chronic kidney disease    ESRD  . Chronic pancreatitis ([place])   . Depression   . Diabetes mellitus type I ([place]) dx'd 1977  . Diabetic osteomyelitis ([place])    right great toe/progress notes 09/23/2012  . Diabetic retinopathy ([place])   . Fatty liver 04/04/2014  . HTN (hypertension)   . Hypercholesteremia   . Hypothyroidism   . Macular degeneration, bilateral   . Migraines 1980's   "when I was in my 20's" (03/14/2015)  . Peripheral neuropathy   . Pneumonia 1993  . PTSD (post-traumatic stress disorder)   . Stroke [facility]) ? date & 09/22/2012  . TIA (transient ischemic attack) 09/22/2012   [name] 10/25/2012    Past Surgical History:   Procedure Laterality Date  . AMPUTATION Right 07/12/2015   Procedure: Right Transmetatarsal amputation;  Surgeon: Newt Minion, MD;  Location: [facility];  Service: Orthopedics;  Laterality: Right;  . BASCILIC VEIN TRANSPOSITION Left 12/31/2017   Procedure: FIRST STAGE BASILIC VEIN TRANSPOSITION LEFT ARM;  Surgeon: Conrad Chuichu, MD;  Location: [facility];  Service: Vascular;  Laterality: Left;  . BASCILIC VEIN TRANSPOSITION Left 02/21/2018   Procedure: SECOND STAGE BASILIC VEIN TRANSPOSITION, LEFT UPPER ARM;  Surgeon: Marty Heck, MD;  Location: [facility];  Service: Vascular;  Laterality: Left;  . CATARACT EXTRACTION, BILATERAL    . [place]; 1995  . EYE SURGERY Bilateral    "hemorrhaged behind my eye"  . I&D EXTREMITY Right multiple   "big toe"  . PERIPHERALLY INSERTED CENTRAL CATHETER INSERTION Right 08/31/2012  . PICC LINE REMOVAL ([place] [facility]X) Right 10/12/2012   [name] 10/12/2012  . REFRACTIVE SURGERY Bilateral 2013  . TOE AMPUTATION Right 11/2012   "big toe"  . TONSILLECTOMY  1965  . TOTAL THYROIDECTOMY  2012  . TUBAL LIGATION  1995    Family Psychiatric History: See intake H&P for full details. Reviewed, with no updates at this time.   Family History:  Family History  Problem Relation Age of Onset  . Depression Mother   . Diabetes Mother   . Alcohol abuse Father   . Alcohol abuse Sister   .  Diabetes Maternal Grandmother     Social History:  Social History   Socioeconomic History  . Marital status: Single    Spouse name: Not on file  . Number of children: Not on file  . Years of education: Not on file  . Highest education level: Not on file  Occupational History  . Not on file  Social Needs  . Financial resource strain: Not on file  . Food insecurity:    Worry: Not on file    Inability: Not on file  . Transportation needs:    Medical: Not on file    Non-medical: Not on file  Tobacco Use  . Smoking status: Never Smoker  . Smokeless tobacco: Never Used   Substance and Sexual Activity  . Alcohol use: No    Alcohol/week: 0.0 standard drinks  . Drug use: No  . Sexual activity: Not Currently  Lifestyle  . Physical activity:    Days per week: Not on file    Minutes per session: Not on file  . Stress: Not on file  Relationships  . Social connections:    Talks on phone: Not on file    Gets together: Not on file    Attends religious service: Not on file    Active member of club or organization: Not on file    Attends meetings of clubs or organizations: Not on file    Relationship status: Not on file  Other Topics Concern  . Not on file  Social History Narrative   Lives in [place] with daughter and father. Completely independent in ADLs, IADLs.    Allergies:  Allergies  Allergen Reactions  . Erythromycin Anaphylaxis  . Norvasc [Amlodipine] Shortness Of Breath and Swelling  . Vancomycin Shortness Of Breath and Other (See Comments)    Red Man Syndrome Has patient had a PCN reaction causing immediate rash, facial/tongue/throat swelling, SOB or lightheadedness with hypotension: unk Has patient had a PCN reaction causing severe rash involving mucus membranes or skin necrosis: unk Has patient had a PCN reaction that required hospitalization: unk Has patient had a PCN reaction occurring within the last 10 years: unk If all of the above answers are "NO", then may proceed with Cephalosporin use.    . Nsaids Other (See Comments)    Avoid due to CKD  . Wellbutrin [Bupropion] Other (See Comments)    Metabolic Disorder Labs: Lab Results  Component Value Date   HGBA1C 9.3 (H) 09/21/2017   MPG 220.21 09/21/2017   MPG 220.21 09/20/2017   No results found for: PROLACTIN Lab Results  Component Value Date   CHOL 183 09/21/2017   TRIG 541 (H) 09/21/2017   HDL 27 (L) 09/21/2017   CHOLHDL 6.8 09/21/2017   VLDL UNABLE TO CALCULATE IF TRIGLYCERIDE OVER 400 mg/dL 09/21/2017   LDLCALC UNABLE TO CALCULATE IF TRIGLYCERIDE OVER 400 mg/dL  09/21/2017   LDLCALC 101 (H) 04/02/2014   Lab Results  Component Value Date   TSH 5.231 (H) 03/13/2015   TSH 4.027 09/23/2012    Therapeutic Level Labs: No results found for: LITHIUM No results found for: VALPROATE No components found for:  CBMZ  Current Medications: Current Outpatient Medications  Medication Sig Dispense Refill  . calcium carbonate (TUMS EX) 750 MG chewable tablet Chew 1 tablet by mouth daily as needed. Calcium Supplement     . carvedilol (COREG) 12.5 MG tablet Take 12.5 mg by mouth 2 (two) times daily with a meal.     . cholecalciferol (VITAMIN D) 1000  units tablet Take 1,000 Units by mouth daily.    . clonazePAM (KLONOPIN) 1 MG tablet Take 1 tablet (1 mg total) by mouth at bedtime. 30 tablet 2  . dorzolamide-timolol (COSOPT) 22.3-6.8 MG/ML ophthalmic solution Place 1 drop into both eyes 2 (two) times daily.     . DULoxetine (CYMBALTA) 60 MG capsule Take 1 capsule (60 mg total) by mouth daily. 30 capsule 2  . furosemide (LASIX) 40 MG tablet Take 40 mg by mouth 2 (two) times daily.    Marland Kitchen glucose blood (CONTOUR NEXT TEST) test strip one strip (1 each total) by Other route 4 (four) times daily. Use as directed. Pharmacy, please dispense this brand of blood glucose test strips: Bayer Contour Next    . insulin glargine (LANTUS) 100 UNIT/ML injection Inject 40 Units into the skin 2 (two) times daily.    . insulin lispro (HUMALOG) 100 UNIT/ML injection Inject 20 Units into the skin See admin instructions. Inject up to 25 units subcutaneously three times daily before meals if needed Per sliding scale as directed    . levothyroxine (SYNTHROID, LEVOTHROID) 150 MCG tablet Take 150 mcg by mouth daily before breakfast.    . lisinopril (PRINIVIL,ZESTRIL) 20 MG tablet Take 20 mg by mouth daily.   5  . Omega-3 1000 MG CAPS Take 1,000 mg by mouth daily.     . RESTASIS 0.05 % ophthalmic emulsion Place 1 drop into both eyes 2 (two) times daily.  11  . rosuvastatin (CRESTOR) 40 MG tablet  Take 40 mg by mouth daily.    . vitamin C (ASCORBIC ACID) 500 MG tablet Take 500 mg by mouth daily.    . clonazePAM (KLONOPIN) 0.5 MG tablet Take 1 tablet (0.5 mg total) by mouth daily as needed for anxiety (for acute anxiety or panic). 30 tablet 0  . oxyCODONE-acetaminophen (PERCOCET) 5-325 MG tablet Take 1 tablet by mouth every 6 (six) hours as needed for up to 20 doses for severe pain. (Patient not taking: Reported on 02/22/2018) 20 tablet 0   No current facility-administered medications for this visit.      Musculoskeletal: Strength & Muscle Tone: within normal limits Gait & Station: normal Patient leans: N/A  Psychiatric Specialty Exam: ROS  Blood pressure (!) 157/80, pulse 78, height 5\' 3"  (1.6 m), weight 170 lb (77.1 kg), SpO2 99 %.Body mass index is 30.11 kg/m.  General Appearance: Casual and Fairly Groomed  Eye Contact:  Fair  Speech:  Clear and Coherent and Normal Rate  Volume:  Normal  Mood:  Dysphoric  Affect:  Congruent  Thought Process:  Coherent and Descriptions of Associations: Intact  Orientation:  Full (Time, Place, and Person)  Thought Content: Logical   Suicidal Thoughts:  No  Homicidal Thoughts:  No  Memory:  Recent;   Fair  Judgement:  Good  Insight:  Good  Psychomotor Activity:  Normal  Concentration:  Concentration: Good  Recall:  Good  Fund of Knowledge: Good  Language: Good  Akathisia:  Negative  Handed:  Right  AIMS (if indicated): not done  Assets:  Communication Skills Desire for Improvement Financial Resources/Insurance Housing  ADL's:  Intact  Cognition: WNL  Sleep:  Good   Screenings: PHQ2-9     Office Visit from 02/13/2013 in [facility] for [facility]ious [facility] Office Visit from 10/12/2012 in [facility] for Infectious Disease Office Visit from 09/22/2012 in [facility] for Infectious Disease Office Visit from 09/14/2012 in [facility] for  Infectious Disease Office Visit from  08/29/2012 in [facility] for Infectious Disease  PHQ-2 Total Score  0  0  0  0  0       Assessment and Plan:  Suzanne Allen presents with overall stable mood and anxiety,, but some flare in anxiety and panic during the day particularly in the context of recent vascular surgery and preparation for ESRD treatment with dialysis.  She is currently stage V kidney disease, and continues to grieve her health issues as a result of type 1 diabetes and high blood pressure.  She also worries about her daughter who has IDD and what will come of her daughter wants the patient passes on.  I spent time with her processing her grief and encouraging her to reach out to resources for additional support.  We agreed to add a low-dose of clonazepam to help with daytime breakthrough anxiety if needed.  1. Major depressive disorder, recurrent episode, mild (HCC)     Status of current problems: new to [employer] Ordered: No orders of the defined types were placed in this encounter.   Labs Reviewed: na  Collateral Obtained/Records Reviewed: Dr Adele Schilder notes  Plan:  Continue clonazepam 1 mg nightly and Cymbalta 60 mg daily as prescribed Additional clonazepam 0.5 mg daily as needed for anxiety or panic RTC 2-3 months  Aundra Dubin, MD 02/22/2018, 4:23 PM

## 2018-02-22 NOTE — Telephone Encounter (Signed)
sch appt lvm 03/22/18 1pm Dialysis 215pm p/o MD

## 2018-02-28 ENCOUNTER — Other Ambulatory Visit: Payer: Self-pay

## 2018-02-28 DIAGNOSIS — N185 Chronic kidney disease, stage 5: Secondary | ICD-10-CM

## 2018-03-22 ENCOUNTER — Ambulatory Visit (HOSPITAL_COMMUNITY)
Admission: RE | Admit: 2018-03-22 | Discharge: 2018-03-22 | Disposition: A | Discharge status: Home / Self Care | Payer: Medicaid Other | Source: Ambulatory Visit | Attending: Vascular Surgery | Admitting: Vascular Surgery

## 2018-03-22 ENCOUNTER — Encounter: Payer: Self-pay | Admitting: Vascular Surgery

## 2018-03-22 ENCOUNTER — Other Ambulatory Visit: Payer: Self-pay

## 2018-03-22 ENCOUNTER — Ambulatory Visit (INDEPENDENT_AMBULATORY_CARE_PROVIDER_SITE_OTHER): Payer: Self-pay | Admitting: Vascular Surgery

## 2018-03-22 VITALS — BP 197/92 | HR 79 | Resp 18 | Ht 63.0 in | Wt 164.0 lb

## 2018-03-22 DIAGNOSIS — N185 Chronic kidney disease, stage 5: Secondary | ICD-10-CM | POA: Diagnosis present

## 2018-03-22 DIAGNOSIS — I770 Arteriovenous fistula, acquired: Secondary | ICD-10-CM | POA: Diagnosis not present

## 2018-03-22 NOTE — Progress Notes (Signed)
Patient name: Suzanne Allen MRN: [id number] DOB: 10-31-1960 Sex: female  REASON FOR VISIT: Postop check  HPI: Suzanne Allen is a 57 y.o. female with chronic kidney disease that presents for postop check after left brachiobasilic AV fistula placement.  Patient states she did have an episode of pneumonia after surgery that has since resolved.  She has no issues with her left arm and feels it has been healing appropriately.  She denies any numbness tingling weakness in the left hand.  She has not started dialysis yet but is very close according to her father.  Past Medical History:  Diagnosis Date  . 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 Anemia   . Anxiety   . CAP (community acquired pneumonia) 03/13/2015  . Carpal tunnel syndrome 11/28/2014   Bilateral  . Chronic kidney disease    ESRD  . Chronic pancreatitis ([place])   . Depression   . Diabetes mellitus type I ([place]) dx'd 1977  . Diabetic osteomyelitis ([place])    right great toe/progress notes 09/23/2012  . Diabetic retinopathy ([place])   . Fatty liver 04/04/2014  . HTN (hypertension)   . Hypercholesteremia   . Hypothyroidism   . Macular degeneration, bilateral   . Migraines 1980's   "when I was in my 20's" (03/14/2015)  . Peripheral neuropathy   . Pneumonia 1993  . PTSD (post-traumatic stress disorder)   . Stroke [facility]) ? date & 09/22/2012  . TIA (transient ischemic attack) 09/22/2012   [name] 10/25/2012    Past Surgical History:  Procedure Laterality Date  . AMPUTATION Right 07/12/2015   Procedure: Right Transmetatarsal amputation;  Surgeon: Newt Minion, MD;  Location: [place];  Service: Orthopedics;  Laterality: Right;  . BASCILIC VEIN TRANSPOSITION Left 12/31/2017   Procedure: FIRST STAGE BASILIC VEIN TRANSPOSITION LEFT ARM;  Surgeon: Conrad Boise, MD;  Location: [place];  Service: Vascular;  Laterality: Left;  . BASCILIC VEIN TRANSPOSITION Left 02/21/2018   Procedure: SECOND STAGE BASILIC VEIN TRANSPOSITION, LEFT  UPPER ARM;  Surgeon: Marty Heck, MD;  Location: [place];  Service: Vascular;  Laterality: Left;  . CATARACT EXTRACTION, BILATERAL    . [place]; 1995  . EYE SURGERY Bilateral    "hemorrhaged behind my eye"  . I&D EXTREMITY Right multiple   "big toe"  . PERIPHERALLY INSERTED CENTRAL CATHETER INSERTION Right 08/31/2012  . PICC LINE REMOVAL ([place] HX) Right 10/12/2012   [name] 10/12/2012  . REFRACTIVE SURGERY Bilateral 2013  . TOE AMPUTATION Right 11/2012   "big toe"  . TONSILLECTOMY  1965  . TOTAL THYROIDECTOMY  2012  . TUBAL LIGATION  1995    Family History  Problem Relation Age of Onset  . Depression Mother   . Diabetes Mother   . Alcohol abuse Father   . Alcohol abuse Sister   . Diabetes Maternal Grandmother     SOCIAL HISTORY: Social History   Tobacco Use  . Smoking status: Never Smoker  . Smokeless tobacco: Never Used  Substance Use Topics  . Alcohol use: No    Alcohol/week: 0.0 standard drinks    Allergies  Allergen Reactions  . Erythromycin Anaphylaxis  . Norvasc [Amlodipine] Shortness Of Breath and Swelling  . Vancomycin Shortness Of Breath and Other (See Comments)    Red Man Syndrome Has patient had a PCN reaction causing immediate rash, facial/tongue/throat swelling, SOB or lightheadedness with hypotension: unk Has patient had a PCN reaction causing severe rash involving mucus membranes or  skin necrosis: unk Has patient had a PCN reaction that required hospitalization: unk Has patient had a PCN reaction occurring within the last 10 years: unk If all of the above answers are "NO", then may proceed with Cephalosporin use.    . Nsaids Other (See Comments)    Avoid due to CKD  . Wellbutrin [Bupropion] Other (See Comments)    Current Outpatient Medications  Medication Sig Dispense Refill  . calcium carbonate (TUMS EX) 750 MG chewable tablet Chew 1 tablet by mouth daily as needed. Calcium Supplement     . carvedilol (COREG) 12.5 MG tablet  Take 12.5 mg by mouth 2 (two) times daily with a meal.     . cholecalciferol (VITAMIN D) 1000 units tablet Take 1,000 Units by mouth daily.    . clonazePAM (KLONOPIN) 0.5 MG tablet Take 1 tablet (0.5 mg total) by mouth daily as needed for anxiety (for acute anxiety or panic). 30 tablet 0  . clonazePAM (KLONOPIN) 1 MG tablet Take 1 tablet (1 mg total) by mouth at bedtime. 30 tablet 2  . dorzolamide-timolol (COSOPT) 22.3-6.8 MG/ML ophthalmic solution Place 1 drop into both eyes 2 (two) times daily.     . DULoxetine (CYMBALTA) 60 MG capsule Take 1 capsule (60 mg total) by mouth daily. 30 capsule 2  . furosemide (LASIX) 40 MG tablet Take 40 mg by mouth 2 (two) times daily.    Marland Kitchen glucose blood (CONTOUR NEXT TEST) test strip one strip (1 each total) by Other route 4 (four) times daily. Use as directed. Pharmacy, please dispense this brand of blood glucose test strips: Bayer Contour Next    . hydrALAZINE (APRESOLINE) 25 MG tablet TK 1 T PO BID  5  . insulin glargine (LANTUS) 100 UNIT/ML injection Inject 40 Units into the skin 2 (two) times daily.    . insulin lispro (HUMALOG) 100 UNIT/ML injection Inject 20 Units into the skin See admin instructions. Inject up to 25 units subcutaneously three times daily before meals if needed Per sliding scale as directed    . levothyroxine (SYNTHROID, LEVOTHROID) 150 MCG tablet Take 150 mcg by mouth daily before breakfast.    . lisinopril (PRINIVIL,ZESTRIL) 20 MG tablet Take 20 mg by mouth daily.   5  . Omega-3 1000 MG CAPS Take 1,000 mg by mouth daily.     . RESTASIS 0.05 % ophthalmic emulsion Place 1 drop into both eyes 2 (two) times daily.  11  . rosuvastatin (CRESTOR) 40 MG tablet Take 40 mg by mouth daily.    . vitamin C (ASCORBIC ACID) 500 MG tablet Take 500 mg by mouth daily.    Marland Kitchen oxyCODONE-acetaminophen (PERCOCET) 5-325 MG tablet Take 1 tablet by mouth every 6 (six) hours as needed for up to 20 doses for severe pain. (Patient not taking: Reported on 02/22/2018) 20  tablet 0   No current facility-administered medications for this visit.      PHYSICAL EXAM: Vitals:   03/22/18 1353 03/22/18 1355  BP: (!) 198/88 (!) 197/92  Pulse: 79   Resp: 18   SpO2: 100%   Weight: 74.4 kg   Height: 5\' 3"  (1.6 m)     General: No acute distress, pleasant Extremities: Left upper extremity brachial basilic fistula with a nice thrill.  2 longitudinal skip incisions are nicely healed.  No drainage.  Palpable pulse in the left wrist.  DATA:   I independently reviewed her left upper extremity fistula duplex: Nice flow volume of 1378 mL a minute: The fistula  is 3 to 4 mm deep which is appropriate: The vein is dilated nicely to 7 to 8 mm.  The only concern is she has an elevated velocity of 419 in the shoulder region.  Assessment/Plan:  Overall I think she has done well since surgery.  She has a nice thrill in the fistula and it looks like the vein is dilated nicely and is an appropriate depth.  I would be okay with them accessing the fistula at this time to attempt hemodialysis if needed.  She does have an elevated velocity in the shoulder segment of her fistula.  We briefly discussed a fistulogram but I am hesitant at this time given that she has not started dialysis yet and any contrast may be nephrotoxic to her at this time.  I suggested we just see how dialysis goes and if there are issues with flow volumes etc. we can move forward with a fistulogram at that time.  They will contact me if issues arise.   Marty Heck, MD [facility] Office: [phone] Pager: [place]

## 2018-05-25 ENCOUNTER — Ambulatory Visit (INDEPENDENT_AMBULATORY_CARE_PROVIDER_SITE_OTHER): Payer: Medicaid Other | Admitting: Psychiatry

## 2018-05-25 ENCOUNTER — Encounter (HOSPITAL_COMMUNITY): Payer: Self-pay | Admitting: Psychiatry

## 2018-05-25 VITALS — BP 165/84 | HR 75 | Ht 63.0 in | Wt 166.0 lb

## 2018-05-25 DIAGNOSIS — F330 Major depressive disorder, recurrent, mild: Secondary | ICD-10-CM

## 2018-05-25 DIAGNOSIS — F411 Generalized anxiety disorder: Secondary | ICD-10-CM | POA: Diagnosis not present

## 2018-05-25 MED ORDER — CLONAZEPAM 1 MG PO TABS: ORAL_TABLET | ORAL | 2 refills | Status: DC

## 2018-05-25 MED ORDER — DULOXETINE HCL 60 MG PO CPEP: 60.0000 mg | ORAL_CAPSULE | Freq: Every day | ORAL | 2 refills | Status: DC
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
## 2018-06-08 ENCOUNTER — Other Ambulatory Visit: Payer: Self-pay

## 2018-06-08 ENCOUNTER — Emergency Department (HOSPITAL_COMMUNITY)
Admission: EM | Admit: 2018-06-08 | Discharge: 2018-06-08 | Discharge status: Against Medical Advice | Payer: Medicaid Other | Attending: Emergency Medicine | Admitting: Emergency Medicine

## 2018-06-08 ENCOUNTER — Encounter (HOSPITAL_COMMUNITY): Payer: Self-pay | Admitting: *Deleted

## 2018-06-08 DIAGNOSIS — Z79899 Other long term (current) drug therapy: Secondary | ICD-10-CM | POA: Insufficient documentation

## 2018-06-08 DIAGNOSIS — N186 End stage renal disease: Secondary | ICD-10-CM | POA: Insufficient documentation

## 2018-06-08 DIAGNOSIS — R531 Weakness: Secondary | ICD-10-CM | POA: Diagnosis present

## 2018-06-08 DIAGNOSIS — I120 Hypertensive chronic kidney disease with stage 5 chronic kidney disease or end stage renal disease: Secondary | ICD-10-CM | POA: Insufficient documentation

## 2018-06-08 DIAGNOSIS — E109 Type 1 diabetes mellitus without complications: Secondary | ICD-10-CM | POA: Diagnosis not present

## 2018-06-08 DIAGNOSIS — E039 Hypothyroidism, unspecified: Secondary | ICD-10-CM | POA: Diagnosis not present

## 2018-06-08 LAB — CBG MONITORING, ED: Glucose-Capillary: 113 mg/dL — ABNORMAL HIGH (ref 70–99)

## 2018-06-08 NOTE — ED Triage Notes (Signed)
The pt arrived by [facility] from [place] she has felt bad for the past 2 hours   She was taken to eat by her daughter and she did not want to go  She ate very little  [facility] was called to transport here here. The pt was nervous so she took her own clonazepam even though the paramedic told her not to do so.  Sl nausea once  At present alert and oriented skin warm and dry  No distres. New fistula placed in her lt elbow area  Not on dialysis yet  She is in kidney failure.  She has a yellowish tint to her skin  Slow speech  ?? medication

## 2018-06-08 NOTE — ED Notes (Signed)
Pt requested to leave ama. She was made aware of all risks and signed ama form.

## 2018-06-08 NOTE — ED Provider Notes (Signed)
[facility] EMERGENCY DEPARTMENT Provider Note   CSN: [id number] Arrival date & time: 06/08/18  1549     History   Chief Complaint Chief Complaint  Patient presents with  . Weakness    HPI Suzanne Allen is a 57 y.o. female.  The history is provided by the patient and medical records. No language interpreter was used.  Weakness    Suzanne Allen is a 57 y.o. female  with multiple chronic comorbidities including CKD, diabetes, hypertension, prior TIA's who presents to the Emergency Department complaining of acute onset of generalized weakness about an hour prior to emergency department arrival.  Patient states that she woke up in her usual state of health.  Around 2 PM, she suddenly got an overwhelming feeling like "the life was getting sucked out of me".  She denies any chest pain, shortness of breath, abdominal pain or headache.  She does endorse feeling a little nauseous and flushed.  She felt as if she was going to pass out but did not.  She states that she was standing on a mosaic floor and that while she was looking at the floor, she felt as if her depth perception was off and that the floor was moving a little.  She did not feel overwhelmingly dizzy though.  No medications taken prior to arrival for symptoms.  Denies any history of similar.  She does report changes in her blood pressure medication regimen recently.  She took both her hydralazine and beta-blocker at the same time today.  She feels as if her symptoms could be related to low blood pressure as she typically does not take them at the same time.  She has taken them at the same time before though without any reaction.  Reports feeling weak in general, but no focal weakness.  No trouble with her speech or gait.  No numbness other than her chronic neuropathy in her lower extremities.  Denies visual changes.   Past Medical History:  Diagnosis Date  . 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  Anemia   . Anxiety   . CAP (community acquired pneumonia) 03/13/2015  . Carpal tunnel syndrome 11/28/2014   Bilateral  . Chronic kidney disease    ESRD  . Chronic pancreatitis ([place])   . Depression   . Diabetes mellitus type I ([place]) dx'd 1977  . Diabetic osteomyelitis ([place])    right great toe/progress notes 09/23/2012  . Diabetic retinopathy ([place])   . Fatty liver 04/04/2014  . HTN (hypertension)   . Hypercholesteremia   . Hypothyroidism   . Macular degeneration, bilateral   . Migraines 1980's   "when I was in my 20's" (03/14/2015)  . Peripheral neuropathy   . Pneumonia 1993  . PTSD (post-traumatic stress disorder)   . Stroke [facility]) ? date & 09/22/2012  . TIA (transient ischemic attack) 09/22/2012   Suzanne Allen 10/25/2012    Patient Active Problem List   Diagnosis Date Noted  . CKD (chronic kidney disease) stage 5, GFR less than 15 ml/min (HCC) 12/22/2017  . Chest pain 09/20/2017  . Cellulitis of left foot 07/23/2016  . Status post transmetatarsal amputation of foot, right ([place]) 07/17/2016  . Major depressive disorder, recurrent episode, mild ([place]) 05/04/2016  . Osteomyelitis ([place]) 07/08/2015  . Poorly controlled diabetes mellitus ([place]) 07/08/2015  . Depression with anxiety 07/08/2015  . Lactic acid acidosis 07/08/2015  . BP (high blood pressure) 06/17/2015  . 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 Hypothyroidism,  postop 06/17/2015  . 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 Proliferative diabetic retinopathy ([place]) 06/17/2015  . Dysesthesia 06/17/2015  . CAP (community acquired pneumonia) 03/13/2015  . Dyspnea 03/13/2015  . Respiratory failure with hypoxia ([place]) 03/13/2015  . Pericardial effusion 03/13/2015  . Depression 02/15/2015  . Carpal tunnel syndrome 11/28/2014  . Diabetes, polyneuropathy ([place]) 11/15/2014  . Numbness 11/15/2014  . Gait disturbance 11/15/2014  . Hand weakness 11/15/2014  . Insomnia 11/15/2014  . DDD (degenerative disc disease), lumbar  04/10/2014  . Diarrhea 04/05/2014  . Dyslipidemia 04/04/2014  . Fatty liver 04/04/2014  . Hypokalemia 04/04/2014  . Elevated LFTs 04/04/2014  . Benign essential HTN 04/02/2014  . Hypothyroidism 04/02/2014  . Diabetes ([place]) 10/10/2013  . Pancreatitis, acute 10/10/2013  . Leukocytosis 10/04/2013  . Acute pancreatitis 10/04/2013  . Diabetic neuropathy ([place]) 10/03/2013  . TIA (transient ischemic attack) 09/23/2012  . Temporary cerebral vascular dysfunction 09/23/2012  . Diabetic osteomyelitis ([place]) 08/29/2012  . Diabetes type 1, uncontrolled ([place]) 08/29/2012  . Type 1 DM with CKD and hypertension ([place]) 08/29/2012  . Type 2 diabetes mellitus ([place]) 08/29/2012  . Diabetic foot ulcer ([place]) 07/27/2012  . Anxiety 08/24/2011  . Arthralgia of multiple joints 10/01/2010  . HLD (hyperlipidemia) 04/25/2010  . Avitaminosis D 04/25/2010  . Allergic rhinitis 08/16/2008    Past Surgical History:  Procedure Laterality Date  . AMPUTATION Right 07/12/2015   Procedure: Right Transmetatarsal amputation;  Surgeon: Suzanne Minion, MD;  Location: [facility];  Service: Orthopedics;  Laterality: Right;  . BASCILIC VEIN TRANSPOSITION Left 12/31/2017   Procedure: FIRST STAGE BASILIC VEIN TRANSPOSITION LEFT ARM;  Surgeon: Suzanne Idabel, MD;  Location: [facility];  Service: Vascular;  Laterality: Left;  . BASCILIC VEIN TRANSPOSITION Left 02/21/2018   Procedure: SECOND STAGE BASILIC VEIN TRANSPOSITION, LEFT UPPER ARM;  Surgeon: Suzanne Heck, MD;  Location: [facility];  Service: Vascular;  Laterality: Left;  . CATARACT EXTRACTION, BILATERAL    . [place]; 1995  . EYE SURGERY Bilateral    "hemorrhaged behind my eye"  . I&D EXTREMITY Right multiple   "big toe"  . PERIPHERALLY INSERTED CENTRAL CATHETER INSERTION Right 08/31/2012  . PICC LINE REMOVAL ([place] [facility]) Right 10/12/2012   Suzanne Allen 10/12/2012  . REFRACTIVE SURGERY Bilateral 2013  . TOE AMPUTATION Right 11/2012   "big toe"  . TONSILLECTOMY  1965  . TOTAL  THYROIDECTOMY  2012  . TUBAL LIGATION  1995     OB History   None      Home Medications    Prior to Admission medications   Medication Sig Start Date End Date Taking? Authorizing Provider  calcium carbonate (TUMS EX) 750 MG chewable tablet Chew 1 tablet by mouth daily as needed. Calcium Supplement     [provider]  carvedilol (COREG) 12.5 MG tablet Take 12.5 mg by mouth 2 (two) times daily with a meal.     [provider]  cholecalciferol (VITAMIN D) 1000 units tablet Take 1,000 Units by mouth daily.    [provider]  clonazePAM (KLONOPIN) 1 MG tablet Take 1 mg at bed time and 1/2 tab daily a s needed for anxiety attack 05/25/18   Arfeen, Arlyce Harman, MD  dorzolamide-timolol (COSOPT) 22.3-6.8 MG/ML ophthalmic solution Place 1 drop into both eyes 2 (two) times daily.     [provider]  DULoxetine (CYMBALTA) 60 MG capsule Take 1 capsule (60 mg total) by mouth daily. 05/25/18   Arfeen, Arlyce Harman, MD  furosemide (  LASIX) 40 MG tablet Take 40 mg by mouth 2 (two) times daily.    [provider]  glucose blood (CONTOUR NEXT TEST) test strip one strip (1 each total) by Other route 4 (four) times daily. Use as directed. Pharmacy, please dispense this brand of blood glucose test strips: Bayer Contour Next 03/09/17   [provider]  hydrALAZINE (APRESOLINE) 25 MG tablet TK 1 T PO BID 03/17/18   [provider]  insulin glargine (LANTUS) 100 UNIT/ML injection Inject 40 Units into the skin 2 (two) times daily.    [provider]  insulin lispro (HUMALOG) 100 UNIT/ML injection Inject 20 Units into the skin See admin instructions. Inject up to 25 units subcutaneously three times daily before meals if needed Per sliding scale as directed    [provider]  levothyroxine (SYNTHROID, LEVOTHROID) 150 MCG tablet Take 150 mcg by mouth daily before breakfast.    [provider]  lisinopril (PRINIVIL,ZESTRIL) 20 MG tablet Take  20 mg by mouth daily.  10/24/17   [provider]  Omega-3 1000 MG CAPS Take 1,000 mg by mouth daily.     [provider]  oxyCODONE-acetaminophen (PERCOCET) 5-325 MG tablet Take 1 tablet by mouth every 6 (six) hours as needed for up to 20 doses for severe pain. Patient not taking: Reported on 02/22/2018 02/21/18   Dagoberto Ligas, PA-C  RESTASIS 0.05 % ophthalmic emulsion Place 1 drop into both eyes 2 (two) times daily. 12/29/17   [provider]  rosuvastatin (CRESTOR) 40 MG tablet Take 40 mg by mouth daily.    [provider]  vitamin C (ASCORBIC ACID) 500 MG tablet Take 500 mg by mouth daily.    [provider]    Family History Family History  Problem Relation Age of Onset  . Depression Mother   . Diabetes Mother   . Alcohol abuse Father   . Alcohol abuse Sister   . Diabetes Maternal Grandmother     Social History Social History   Tobacco Use  . Smoking status: Never Smoker  . Smokeless tobacco: Never Used  Substance Use Topics  . Alcohol use: No    Alcohol/week: 0.0 standard drinks  . Drug use: No     Allergies   Erythromycin; Norvasc [amlodipine]; Vancomycin; Nsaids; and Wellbutrin [bupropion]   Review of Systems Review of Systems  Constitutional: Positive for fatigue.  Neurological: Positive for weakness.  All other systems reviewed and are negative.   Physical Exam Updated Vital Signs BP (!) 144/58   Pulse 71   Temp 99.1 F (37.3 C) (Oral)   Resp 16   Ht 5\' 3"  (1.6 m)   Wt 73 kg   SpO2 99%   BMI 28.52 kg/m   Physical Exam  Constitutional: She is oriented to person, place, and time. She appears well-developed and well-nourished. No distress.  HENT:  Head: Normocephalic and atraumatic.  Cardiovascular: Normal rate, regular rhythm and normal heart sounds.  No murmur heard. Pulmonary/Chest: Effort normal and breath sounds normal. No respiratory distress.  Abdominal: Soft. She exhibits no distension. There is  no tenderness.  Musculoskeletal: She exhibits no edema.  Neurological: She is alert and oriented to person, place, and time.  Alert, oriented, thought content appropriate, able to give a coherent history. Speech is clear and goal oriented, able to follow commands.  Cranial Nerves:  II:  Peripheral visual fields grossly normal, pupils equal, round, reactive to light III, IV, VI: EOM intact bilaterally, ptosis not present  V,VII: smile symmetric, eyes kept closed tightly against resistance, facial light touch sensation equal VIII: hearing grossly normal IX, X: symmetric soft palate movement, uvula elevates symmetrically  XI: bilateral shoulder shrug symmetric and strong XII: midline tongue extension 5/5 muscle strength in upper and lower extremities bilaterally including strong and equal grip strength and dorsiflexion/plantar flexion Sensory to light touch normal in all four extremities.  Normal finger-to-nose and rapid alternating movements; no drift.  Skin: Skin is warm and dry.  Nursing note and vitals reviewed.    ED Treatments / Results  Labs (all labs ordered are listed, but only abnormal results are displayed) Labs Reviewed  CBG MONITORING, ED - Abnormal; Notable for the following components:      Result Value   Glucose-Capillary 113 (*)    All other components within normal limits  CBC WITH DIFFERENTIAL/PLATELET  COMPREHENSIVE METABOLIC PANEL  I-STAT TROPONIN, ED    EKG EKG Interpretation  Date/Time:  Wednesday June 08 2018 15:47:33 EST Ventricular Rate:  72 PR Interval:  164 QRS Duration: 80 QT Interval:  456 QTC Calculation: 499 R Axis:   3 Text Interpretation:  Normal sinus rhythm No significant change since last tracing Confirmed by Lajean Saver [phone]) on 06/08/2018 4:25:29 PM   Radiology No results found.  Procedures Procedures (including critical care time)  Medications Ordered in ED Medications - No data to display   Initial Impression /  Assessment and Plan / ED Course  I have reviewed the triage vital signs and the nursing notes.  Pertinent labs & imaging results that were available during my care of the patient were reviewed by me and considered in my medical decision making (see chart for details).    Suzanne Allen is a 57 y.o. female who presents to ED for generalized weakness /near syncopal episode which began acutely about an hour prior to emergency department arrival.  Denies associated chest pain or shortness of breath.  On exam, patient is afebrile, medically stable with no focal neurologic deficits.  Plan for CT head and labs.  Apparently, patient requested to leave and signed out [facility] per nursing note. I was not notified that patient had made this request and she has already left department.    Final Clinical Impressions(s) / ED Diagnoses   Final diagnoses:  Weakness    ED Discharge Orders    None       Allysson Rinehimer, Ozella Almond, PA-C 06/08/18 1751    Lajean Saver, MD 06/09/18 0021

## 2018-08-10 DIAGNOSIS — S98131A Complete traumatic amputation of one right lesser toe, initial encounter: Secondary | ICD-10-CM | POA: Insufficient documentation

## 2018-08-15 ENCOUNTER — Ambulatory Visit (INDEPENDENT_AMBULATORY_CARE_PROVIDER_SITE_OTHER): Payer: Medicaid Other | Admitting: Psychiatry

## 2018-08-15 ENCOUNTER — Encounter (HOSPITAL_COMMUNITY): Payer: Self-pay | Admitting: Psychiatry

## 2018-08-15 VITALS — BP 142/80 | Ht 63.0 in | Wt 160.0 lb

## 2018-08-15 DIAGNOSIS — N185 Chronic kidney disease, stage 5: Secondary | ICD-10-CM

## 2018-08-15 DIAGNOSIS — F411 Generalized anxiety disorder: Secondary | ICD-10-CM

## 2018-08-15 DIAGNOSIS — L97411 Non-pressure chronic ulcer of right heel and midfoot limited to breakdown of skin: Secondary | ICD-10-CM

## 2018-08-15 DIAGNOSIS — Z89431 Acquired absence of right foot: Secondary | ICD-10-CM

## 2018-08-15 DIAGNOSIS — F330 Major depressive disorder, recurrent, mild: Secondary | ICD-10-CM | POA: Diagnosis not present

## 2018-08-15 DIAGNOSIS — F4310 Post-traumatic stress disorder, unspecified: Secondary | ICD-10-CM | POA: Diagnosis not present

## 2018-08-15 MED ORDER — CLONAZEPAM 1 MG PO TABS: ORAL_TABLET | ORAL | 2 refills | Status: DC

## 2018-08-15 MED ORDER — DULOXETINE HCL 60 MG PO CPEP: 60.0000 mg | ORAL_CAPSULE | Freq: Every day | ORAL | 2 refills | Status: DC
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
## 2018-08-29 ENCOUNTER — Telehealth: Payer: Self-pay | Admitting: Vascular Surgery

## 2018-08-29 NOTE — Telephone Encounter (Signed)
Father, Luster Landsberg [phone], came by stating pt fistula needs checked due to difficulties at [facility] dialysis. Given appt with PA 08/31/18. Facility and provider notified.

## 2018-08-30 NOTE — Progress Notes (Signed)
HISTORY AND PHYSICAL     CC:  dialysis access Requesting Provider:  Mindi Curling, PA-C  HPI: This is a 58 y.o. female here for evaluation of her hemodialysis access.  She underwent a left 2nd stage BVT by Dr. Carlis Abbott on 02/21/18.  She states that they have tried to use it 3x now and one time went well, the other times, they had difficulty with cannulation.  She is not on any blood thinners.  She last ate at 8am this morning.     Pt is on dialysis.  M/W/F at [facility] location.  If pt on Dialysis:   Dialysis days/center:  [facility]  The pt is on a statin for cholesterol management.  The pt is diabetic.   The pt is on an ACEI and BB for hypertension.   Tobacco hx:  never The pt is not on a daily aspirin. Other AC:  none  Past Medical History:  Diagnosis Date  . 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 Anemia   . Anxiety   . CAP (community acquired pneumonia) 03/13/2015  . Carpal tunnel syndrome 11/28/2014   Bilateral  . Chronic kidney disease    ESRD  . Chronic pancreatitis ([place])   . Depression   . Diabetes mellitus type I ([place]) dx'd 1977  . Diabetic osteomyelitis ([place])    right great toe/progress notes 09/23/2012  . Diabetic retinopathy ([place])   . Fatty liver 04/04/2014  . HTN (hypertension)   . Hypercholesteremia   . Hypothyroidism   . Macular degeneration, bilateral   . Migraines 1980's   "when I was in my 20's" (03/14/2015)  . Peripheral neuropathy   . Pneumonia 1993  . PTSD (post-traumatic stress disorder)   . Stroke [facility]) ? date & 09/22/2012  . TIA (transient ischemic attack) 09/22/2012   [name] 10/25/2012    Past Surgical History:  Procedure Laterality Date  . AMPUTATION Right 07/12/2015   Procedure: Right Transmetatarsal amputation;  Surgeon: Newt Minion, MD;  Location: [place];  Service: Orthopedics;  Laterality: Right;  . BASCILIC VEIN TRANSPOSITION Left 12/31/2017   Procedure: FIRST STAGE BASILIC VEIN TRANSPOSITION LEFT ARM;  Surgeon: Conrad [place], MD;  Location: [place];  Service: Vascular;  Laterality: Left;  . BASCILIC VEIN TRANSPOSITION Left 02/21/2018   Procedure: SECOND STAGE BASILIC VEIN TRANSPOSITION, LEFT UPPER ARM;  Surgeon: Marty Heck, MD;  Location: [place];  Service: Vascular;  Laterality: Left;  . CATARACT EXTRACTION, BILATERAL    . [place]; 1995  . EYE SURGERY Bilateral    "hemorrhaged behind my eye"  . I&D EXTREMITY Right multiple   "big toe"  . PERIPHERALLY INSERTED CENTRAL CATHETER INSERTION Right 08/31/2012  . PICC LINE REMOVAL ([place] HX) Right 10/12/2012   [name] 10/12/2012  . REFRACTIVE SURGERY Bilateral 2013  . TOE AMPUTATION Right 11/2012   "big toe"  . TONSILLECTOMY  1965  . TOTAL THYROIDECTOMY  2012  . TUBAL LIGATION  1995    Allergies  Allergen Reactions  . Erythromycin Anaphylaxis  . Norvasc [Amlodipine] Shortness Of Breath and Swelling  . Vancomycin Shortness Of Breath and Other (See Comments)    Red Man Syndrome Has patient had a PCN reaction causing immediate rash, facial/tongue/throat swelling, SOB or lightheadedness with hypotension: unk Has patient had a PCN reaction causing severe rash involving mucus membranes or skin necrosis: unk Has patient had a PCN reaction that required hospitalization: unk Has patient had a PCN reaction occurring within  the last 10 years: unk If all of the above answers are "NO", then may proceed with Cephalosporin use.    . Nsaids Other (See Comments)    Avoid due to CKD  . Wellbutrin [Bupropion] Other (See Comments)    Current Outpatient Medications  Medication Sig Dispense Refill  . calcium carbonate (TUMS EX) 750 MG chewable tablet Chew 1 tablet by mouth daily as needed. Calcium Supplement     . carvedilol (COREG) 12.5 MG tablet Take 12.5 mg by mouth 2 (two) times daily with a meal.     . cholecalciferol (VITAMIN D) 1000 units tablet Take 1,000 Units by mouth daily.    . clonazePAM (KLONOPIN) 1 MG tablet Take 1 mg at bed time 30  tablet 2  . dorzolamide-timolol (COSOPT) 22.3-6.8 MG/ML ophthalmic solution Place 1 drop into both eyes 2 (two) times daily.     . DULoxetine (CYMBALTA) 60 MG capsule Take 1 capsule (60 mg total) by mouth daily. 30 capsule 2  . furosemide (LASIX) 40 MG tablet Take 40 mg by mouth 2 (two) times daily.    Marland Kitchen glucose blood (CONTOUR NEXT TEST) test strip one strip (1 each total) by Other route 4 (four) times daily. Use as directed. Pharmacy, please dispense this brand of blood glucose test strips: Bayer Contour Next    . hydrALAZINE (APRESOLINE) 25 MG tablet TK 1 T PO BID  5  . insulin glargine (LANTUS) 100 UNIT/ML injection Inject 40 Units into the skin 2 (two) times daily.    . insulin lispro (HUMALOG) 100 UNIT/ML injection Inject 20 Units into the skin See admin instructions. Inject up to 25 units subcutaneously three times daily before meals if needed Per sliding scale as directed    . levothyroxine (SYNTHROID, LEVOTHROID) 150 MCG tablet Take 150 mcg by mouth daily before breakfast.    . lisinopril (PRINIVIL,ZESTRIL) 20 MG tablet Take 20 mg by mouth daily.   5  . Omega-3 1000 MG CAPS Take 1,000 mg by mouth daily.     . RESTASIS 0.05 % ophthalmic emulsion Place 1 drop into both eyes 2 (two) times daily.  11  . rosuvastatin (CRESTOR) 40 MG tablet Take 40 mg by mouth daily.    . vitamin C (ASCORBIC ACID) 500 MG tablet Take 500 mg by mouth daily.     No current facility-administered medications for this visit.     Family History  Problem Relation Age of Onset  . Depression Mother   . Diabetes Mother   . Alcohol abuse Father   . Alcohol abuse Sister   . Diabetes Maternal Grandmother     Social History   Socioeconomic History  . Marital status: Single    Spouse name: Not on file  . Number of children: Not on file  . Years of education: Not on file  . Highest education level: Not on file  Occupational History  . Not on file  Social Needs  . Financial resource strain: Not on file  . Food  insecurity:    Worry: Not on file    Inability: Not on file  . Transportation needs:    Medical: Not on file    Non-medical: Not on file  Tobacco Use  . Smoking status: Never Smoker  . Smokeless tobacco: Never Used  Substance and Sexual Activity  . Alcohol use: No    Alcohol/week: 0.0 standard drinks  . Drug use: No  . Sexual activity: Not Currently  Lifestyle  . Physical activity:  Days per week: Not on file    Minutes per session: Not on file  . Stress: Not on file  Relationships  . Social connections:    Talks on phone: Not on file    Gets together: Not on file    Attends religious service: Not on file    Active member of club or organization: Not on file    Attends meetings of clubs or organizations: Not on file    Relationship status: Not on file  . Intimate partner violence:    Fear of current or ex partner: Not on file    Emotionally abused: Not on file    Physically abused: Not on file    Forced sexual activity: Not on file  Other Topics Concern  . Not on file  Social History Narrative   Lives in [place] with daughter and father. Completely independent in ADLs, IADLs.     ROS: [x]  Positive   [ ]  Negative   [ ]  All sytems reviewed and are negative  Cardiac: []  chest pain/pressure []  SOB []  DOE  Vascular: []  pain in legs while walking []  pain in feet when lying flat []  hx of DVT []  swelling in legs  Pulmonary: []  asthma []  wheezing  Neurologic: []  weakness in []  arms []  legs []  numbness in []  arms []  legs [] difficulty speaking or slurred speech []  temporary loss of vision in one eye []  dizziness  Hematologic: []  bleeding problems  GI []  GERD  GU: [x]  CKD/renal failure  [x]  HD---[x]  M/W/F []  T/T/S []  burning with urination []  blood in urine  Psychiatric: []  hx of major depression  Integumentary: []  rashes []  ulcers  Constitutional: []  fever []  chills  PHYSICAL EXAMINATION:  Today's Vitals   08/31/18 0917  BP: 136/70    Pulse: (!) 102  Resp: 14  Temp: 98 F (36.7 C)  TempSrc: Oral  SpO2: 97%  Weight: 159 lb 2.8 oz (72.2 kg)  Height: 5\' 3"  (1.6 m)   Body mass index is 28.2 kg/m.    General:  WDWN female in NAD Gait: Not observed HENT: WNL Pulmonary: normal non-labored breathing , without Rales, rhonchi,  wheezing Cardiac: regular, without  Murmurs without carotid bruits Abdomen: soft, NT, no masses Skin: ecchymosis over fistula  Vascular Exam/Pulses:   Right Left  Radial 2+ (normal) 2+ (normal)  PT 2+ (normal) 2+ (normal)   Extremities:  Fistula is pulsatile Musculoskeletal: no muscle wasting or atrophy  Neurologic: A&O X 3; Moving all extremities equally;  Speech is fluent/normal  Non-Invasive Vascular Imaging:   none   ASSESSMENT/PLAN: 58 y.o. female  here for evaluation of her hemodialysis access.  She has had dialysis 3x and two of those sessions, they had difficulty cannulating her fistula.  She ate this morning at 8am.  Will schedule for fistulogram and possible tunneled catheter placement this afternoon at 1pm.  Will schedule her dialysis for tomorrow (off day).  Instructed her not to eat or drink the rest of the day.    Leontine Locket, PA-C [facility] [phone]  Clinic MD:   One[facility] Alar

## 2018-08-31 ENCOUNTER — Encounter (HOSPITAL_COMMUNITY): Admission: RE | Disposition: A | Discharge status: Home / Self Care | Payer: Self-pay | Attending: Vascular Surgery

## 2018-08-31 ENCOUNTER — Ambulatory Visit (HOSPITAL_COMMUNITY)
Admission: RE | Admit: 2018-08-31 | Discharge: 2018-08-31 | Disposition: A | Discharge status: Home / Self Care | Payer: Medicaid Other | Source: Other Acute Inpatient Hospital | Attending: Vascular Surgery | Admitting: Vascular Surgery

## 2018-08-31 ENCOUNTER — Ambulatory Visit (HOSPITAL_COMMUNITY): Payer: Medicaid Other

## 2018-08-31 ENCOUNTER — Other Ambulatory Visit: Payer: Self-pay

## 2018-08-31 ENCOUNTER — Ambulatory Visit: Payer: Medicaid Other | Admitting: Physician Assistant

## 2018-08-31 ENCOUNTER — Other Ambulatory Visit: Payer: Self-pay | Admitting: *Deleted

## 2018-08-31 VITALS — BP 136/70 | HR 102 | Temp 98.0°F | Resp 14 | Ht 63.0 in | Wt 159.2 lb

## 2018-08-31 DIAGNOSIS — E039 Hypothyroidism, unspecified: Secondary | ICD-10-CM | POA: Insufficient documentation

## 2018-08-31 DIAGNOSIS — Z833 Family history of diabetes mellitus: Secondary | ICD-10-CM | POA: Diagnosis not present

## 2018-08-31 DIAGNOSIS — Z9851 Tubal ligation status: Secondary | ICD-10-CM | POA: Insufficient documentation

## 2018-08-31 DIAGNOSIS — N186 End stage renal disease: Secondary | ICD-10-CM | POA: Insufficient documentation

## 2018-08-31 DIAGNOSIS — T82898A Other specified complication of vascular prosthetic devices, implants and grafts, initial encounter: Secondary | ICD-10-CM | POA: Diagnosis not present

## 2018-08-31 DIAGNOSIS — Z888 Allergy status to other drugs, medicaments and biological substances status: Secondary | ICD-10-CM | POA: Diagnosis not present

## 2018-08-31 DIAGNOSIS — Z881 Allergy status to other antibiotic agents status: Secondary | ICD-10-CM | POA: Insufficient documentation

## 2018-08-31 DIAGNOSIS — Z794 Long term (current) use of insulin: Secondary | ICD-10-CM | POA: Diagnosis not present

## 2018-08-31 DIAGNOSIS — E7800 Pure hypercholesterolemia, unspecified: Secondary | ICD-10-CM | POA: Diagnosis not present

## 2018-08-31 DIAGNOSIS — Z79890 Hormone replacement therapy (postmenopausal): Secondary | ICD-10-CM | POA: Diagnosis not present

## 2018-08-31 DIAGNOSIS — E1140 Type 2 diabetes mellitus with diabetic neuropathy, unspecified: Secondary | ICD-10-CM | POA: Diagnosis not present

## 2018-08-31 DIAGNOSIS — Z992 Dependence on renal dialysis: Secondary | ICD-10-CM

## 2018-08-31 DIAGNOSIS — Z89411 Acquired absence of right great toe: Secondary | ICD-10-CM | POA: Insufficient documentation

## 2018-08-31 DIAGNOSIS — Z8673 Personal history of transient ischemic attack (TIA), and cerebral infarction without residual deficits: Secondary | ICD-10-CM | POA: Diagnosis not present

## 2018-08-31 DIAGNOSIS — G5600 Carpal tunnel syndrome, unspecified upper limb: Secondary | ICD-10-CM | POA: Insufficient documentation

## 2018-08-31 DIAGNOSIS — T82858A Stenosis of vascular prosthetic devices, implants and grafts, initial encounter: Secondary | ICD-10-CM | POA: Insufficient documentation

## 2018-08-31 DIAGNOSIS — Z79899 Other long term (current) drug therapy: Secondary | ICD-10-CM | POA: Insufficient documentation

## 2018-08-31 DIAGNOSIS — I120 Hypertensive chronic kidney disease with stage 5 chronic kidney disease or end stage renal disease: Secondary | ICD-10-CM | POA: Insufficient documentation

## 2018-08-31 DIAGNOSIS — Z886 Allergy status to analgesic agent status: Secondary | ICD-10-CM | POA: Insufficient documentation

## 2018-08-31 DIAGNOSIS — E1122 Type 2 diabetes mellitus with diabetic chronic kidney disease: Secondary | ICD-10-CM | POA: Insufficient documentation

## 2018-08-31 DIAGNOSIS — Y832 Surgical operation with anastomosis, bypass or graft as the cause of abnormal reaction of the patient, or of later complication, without mention of misadventure at the time of the procedure: Secondary | ICD-10-CM | POA: Diagnosis not present

## 2018-08-31 HISTORY — PX: PERIPHERAL VASCULAR INTERVENTION: CATH118257

## 2018-08-31 HISTORY — PX: A/V FISTULAGRAM: CATH118298

## 2018-08-31 LAB — GLUCOSE, CAPILLARY: Glucose-Capillary: 282 mg/dL — ABNORMAL HIGH (ref 70–99)

## 2018-08-31 LAB — POCT I-STAT 4, (NA,K, GLUC, HGB,HCT)
Glucose, Bld: 271 mg/dL — ABNORMAL HIGH (ref 70–99)
HCT: 24 % — ABNORMAL LOW (ref 36.0–46.0)
Hemoglobin: 8.2 g/dL — ABNORMAL LOW (ref 12.0–15.0)
Potassium: 3.8 mmol/L (ref 3.5–5.1)
Sodium: 139 mmol/L (ref 135–145)

## 2018-08-31 LAB — POCT I-STAT CREATININE: Creatinine, Ser: 4.6 mg/dL — ABNORMAL HIGH (ref 0.44–1.00)

## 2018-08-31 SURGERY — A/V FISTULAGRAM
Anesthesia: LOCAL

## 2018-08-31 MED ORDER — HEPARIN (PORCINE) IN NACL 1000-0.9 UT/500ML-% IV SOLN
INTRAVENOUS | Status: DC | PRN
  Administered 2018-08-31: 500 mL

## 2018-08-31 MED ORDER — LIDOCAINE HCL (PF) 1 % IJ SOLN
INTRAMUSCULAR | Status: AC
  Filled 2018-08-31: qty 30

## 2018-08-31 MED ORDER — SODIUM CHLORIDE 0.9 % IV SOLN: 250.0000 mL | INTRAVENOUS | Status: DC | PRN

## 2018-08-31 MED ORDER — SODIUM CHLORIDE 0.9% FLUSH: 3.0000 mL | Freq: Two times a day (BID) | INTRAVENOUS | Status: DC

## 2018-08-31 MED ORDER — FENTANYL CITRATE (PF) 100 MCG/2ML IJ SOLN
INTRAMUSCULAR | Status: AC
  Filled 2018-08-31: qty 2

## 2018-08-31 MED ORDER — FENTANYL CITRATE (PF) 100 MCG/2ML IJ SOLN
INTRAMUSCULAR | Status: DC | PRN
  Administered 2018-08-31: 25 ug via INTRAVENOUS

## 2018-08-31 MED ORDER — ONDANSETRON HCL 4 MG/2ML IJ SOLN: 4.0000 mg | Freq: Four times a day (QID) | INTRAMUSCULAR | Status: DC | PRN

## 2018-08-31 MED ORDER — LIDOCAINE HCL (PF) 1 % IJ SOLN
INTRAMUSCULAR | Status: DC | PRN
  Administered 2018-08-31: 2 mL via INTRADERMAL

## 2018-08-31 MED ORDER — HEPARIN (PORCINE) IN NACL 1000-0.9 UT/500ML-% IV SOLN
INTRAVENOUS | Status: AC
  Filled 2018-08-31: qty 500

## 2018-08-31 MED ORDER — IODIXANOL 320 MG/ML IV SOLN
INTRAVENOUS | Status: DC | PRN
  Administered 2018-08-31: 65 mL via INTRAVENOUS

## 2018-08-31 MED ORDER — HYDRALAZINE HCL 20 MG/ML IJ SOLN: 5.0000 mg | INTRAMUSCULAR | Status: DC | PRN

## 2018-08-31 MED ORDER — MIDAZOLAM HCL 2 MG/2ML IJ SOLN
INTRAMUSCULAR | Status: AC
  Filled 2018-08-31: qty 2

## 2018-08-31 MED ORDER — ACETAMINOPHEN 325 MG PO TABS: 650.0000 mg | ORAL_TABLET | ORAL | Status: DC | PRN

## 2018-08-31 MED ORDER — SODIUM CHLORIDE 0.9% FLUSH: 3.0000 mL | INTRAVENOUS | Status: DC | PRN

## 2018-08-31 MED ORDER — OXYCODONE HCL 5 MG PO TABS: 5.0000 mg | ORAL_TABLET | ORAL | Status: DC | PRN

## 2018-08-31 MED ORDER — HEPARIN SODIUM (PORCINE) 1000 UNIT/ML IJ SOLN
INTRAMUSCULAR | Status: DC | PRN
  Administered 2018-08-31 (×3): 3000 [IU] via INTRAVENOUS

## 2018-08-31 MED ORDER — LABETALOL HCL 5 MG/ML IV SOLN: 10.0000 mg | INTRAVENOUS | Status: DC | PRN

## 2018-08-31 MED ORDER — LIDOCAINE HCL (PF) 1 % IJ SOLN
INTRAMUSCULAR | Status: DC | PRN
  Administered 2018-08-31 (×2): 2 mL via INTRADERMAL

## 2018-08-31 MED ORDER — MIDAZOLAM HCL 2 MG/2ML IJ SOLN
INTRAMUSCULAR | Status: DC | PRN
  Administered 2018-08-31: 1 mg via INTRAVENOUS

## 2018-08-31 MED ORDER — HEPARIN SODIUM (PORCINE) 1000 UNIT/ML IJ SOLN
INTRAMUSCULAR | Status: AC
  Filled 2018-08-31: qty 1

## 2018-08-31 SURGICAL SUPPLY — 25 items
BAG SNAP BAND KOVER 36X36 (MISCELLANEOUS) ×3 IMPLANT
BALLN LUTONIX AV 7X60X75 (BALLOONS) ×3
BALLN MUSTANG 4.0X40 75 (BALLOONS) ×3
BALLN MUSTANG 6.0X40 75 (BALLOONS) ×3
BALLN MUSTANG 7.0X40 75 (BALLOONS) ×3
BALLOON LUTONIX AV 7X60X75 (BALLOONS) ×2 IMPLANT
BALLOON MUSTANG 4.0X40 75 (BALLOONS) ×2 IMPLANT
BALLOON MUSTANG 6.0X40 75 (BALLOONS) ×2 IMPLANT
BALLOON MUSTANG 7.0X40 75 (BALLOONS) ×2 IMPLANT
CATH BEACON 5 .035 65 KMP TIP (CATHETERS) ×3 IMPLANT
CATH PALINDROME RT-P 15FX19CM (CATHETERS) ×3 IMPLANT
COVER DOME SNAP 22 D (MISCELLANEOUS) ×3 IMPLANT
DEVICE TORQUE .025-.038 (MISCELLANEOUS) ×3 IMPLANT
GUIDEWIRE ANGLED .035X150CM (WIRE) ×3 IMPLANT
KIT ENCORE 26 ADVANTAGE (KITS) ×3 IMPLANT
KIT MICROPUNCTURE NIT STIFF (SHEATH) ×6 IMPLANT
PROTECTION STATION PRESSURIZED (MISCELLANEOUS) ×6
SHEATH PINNACLE R/O II 5F 6CM (SHEATH) ×3 IMPLANT
SHEATH PINNACLE R/O II 6F 4CM (SHEATH) ×3 IMPLANT
SHEATH PROBE COVER 6X72 (BAG) ×9 IMPLANT
STATION PROTECTION PRESSURIZED (MISCELLANEOUS) ×4 IMPLANT
STOPCOCK MORSE 400PSI 3WAY (MISCELLANEOUS) ×3 IMPLANT
TRAY PV CATH (CUSTOM PROCEDURE TRAY) ×6 IMPLANT
TUBING CIL FLEX 10 FLL-RA (TUBING) ×3 IMPLANT
WIRE BENTSON .035X145CM (WIRE) ×3 IMPLANT

## 2018-08-31 NOTE — Discharge Instructions (Signed)
Central Line Dialysis Access Placement, Care After This sheet gives you information about how to care for yourself after your procedure. Your health care provider may also give you more specific instructions. If you have problems or questions, contact your health care provider. What can I expect after the procedure? After the procedure, it is common to have:  Mild pain or discomfort.  Mild redness, swelling, or bruising around your incision.  A small amount of blood or clear fluid coming from your incision. Follow these instructions at home: Incision care   Follow instructions from your health care provider about how to take care of your incision. Make sure you: ? Wash your hands with soap and water before you change your bandage (dressing). If soap and water are not available, use hand sanitizer. ? Change your dressing as told by your health care provider. ? Leave stitches (sutures) in place.  Check your incision area every day for signs of infection. Check for: ? More redness, swelling, or pain. ? More fluid or blood. ? Warmth. ? Pus or a bad smell.  If directed, put heat on the catheter site as often as told by your health care provider. Use the heat source that your health care provider recommends, such as a moist heat pack or a heating pad. ? Place a towel between your skin and the heat source. ? Leave the heat on for 20-30 minutes. ? Remove the heat if your skin turns bright red. This is especially important if you are unable to feel pain, heat, or cold. You may have a greater risk of getting burned.  If directed, put ice on the catheter site: ? Put ice in a plastic bag. ? Place a towel between your skin and the bag. ? Leave the ice on for 20 minutes, 2-3 times a day. Medicines  Take over-the-counter and prescription medicines only as told by your health care provider.  If you were prescribed an antibiotic medicine, use it as told by your health care provider. Do not stop  using the antibiotic even if you start to feel better. Activity  Return to your normal activities as told by your health care provider. Ask your health care provider what activities are safe for you.  Do not lift anything that is heavier than 5 lb (4.5 kg) until your health care provider says that this is safe. Driving  Do not drive for 24 hours if you were given a medicine to help you relax (sedative) during your procedure.  Do not drive or use heavy machinery while taking prescription pain medicine. Lifestyle  Limit alcohol intake to no more than 1 drink a day for nonpregnant women and 2 drinks a day for men. One drink equals 12 oz of beer, 5 oz of wine, or 1 oz of hard liquor.  Do not use any products that contain nicotine or tobacco, such as cigarettes and e-cigarettes. If you need help quitting, ask your health care provider. General instructions  Do not take baths or showers, swim, or use a hot tub until your health care provider approves. You may only be allowed to take sponge baths for bathing.  Wear compression stockings as told by your health care provider. These stockings help to prevent blood clots and reduce swelling in your legs.  Follow instructions from your health care provider about eating or drinking restrictions.  Keep all follow-up visits as told by your health care provider. This is important. Contact a health care provider if:  Your  catheter gets pulled out of place.  Your catheter site becomes itchy.  You develop a rash around your catheter site.  You have more redness, swelling, or pain around your incision.  You have more fluid or blood coming from your incision.  Your incision area feels warm to the touch.  You have pus or a bad smell coming from your incision.  You have a fever. Get help right away if:  You become light-headed or dizzy.  You faint.  You have difficulty breathing.  Your catheter gets pulled out completely. This  information is not intended to replace advice given to you by your health care provider. Make sure you discuss any questions you have with your health care provider. Document Released: 02/11/2004 Document Revised: 03/23/2016 Document Reviewed: 03/23/2016 Elsevier Interactive Patient Education  [employer].

## 2018-08-31 NOTE — H&P (Signed)
History and Physical Interval Note:  08/31/2018 3:56 PM  Suzanne Allen  has presented today for surgery, with the diagnosis of poor flow  The various methods of treatment have been discussed with the patient and family. After consideration of risks, benefits and other options for treatment, the patient has consented to  Procedure(s): A/V FISTULAGRAM - Left arm (N/A) as a surgical intervention .  The patient's history has been reviewed, patient examined, no change in status, stable for surgery.  I have reviewed the patient's chart and labs.  Questions were answered to the patient's satisfaction.    Left arm fistulogram, possible TDC.  Marty Heck  HISTORY AND PHYSICAL     CC:  dialysis access Requesting Provider:  Juanda Chance  HPI: This is a 57 y.o. female here for evaluation of her hemodialysis access.  She underwent a left 2nd stage BVT by Dr. Carlis Abbott on 02/21/18.  She states that they have tried to use it 3x now and one time went well, the other times, they had difficulty with cannulation.  She is not on any blood thinners.  She last ate at 8am this morning.     Pt is on dialysis.  M/W/F at [facility] location.  If pt on Dialysis:   Dialysis days/center:  [facility]  The pt is on a statin for cholesterol management.  The pt is diabetic.   The pt is on an ACEI and BB for hypertension.   Tobacco hx:  never The pt is not on a daily aspirin. Other AC:  none      Past Medical History:  Diagnosis Date  . 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 Anemia   . Anxiety   . CAP (community acquired pneumonia) 03/13/2015  . Carpal tunnel syndrome 11/28/2014   Bilateral  . Chronic kidney disease    ESRD  . Chronic pancreatitis ([place])   . Depression   . Diabetes mellitus type I ([place]) dx'd 1977  . Diabetic osteomyelitis ([place])    right great toe/progress notes 09/23/2012  . Diabetic retinopathy ([place])   . Fatty liver 04/04/2014  . HTN  (hypertension)   . Hypercholesteremia   . Hypothyroidism   . Macular degeneration, bilateral   . Migraines 1980's   "when I was in my 20's" (03/14/2015)  . Peripheral neuropathy   . Pneumonia 1993  . PTSD (post-traumatic stress disorder)   . Stroke [facility]) ? date & 09/22/2012  . TIA (transient ischemic attack) 09/22/2012   [name] 10/25/2012         Past Surgical History:  Procedure Laterality Date  . AMPUTATION Right 07/12/2015   Procedure: Right Transmetatarsal amputation;  Surgeon: Newt Minion, MD;  Location: [place];  Service: Orthopedics;  Laterality: Right;  . BASCILIC VEIN TRANSPOSITION Left 12/31/2017   Procedure: FIRST STAGE BASILIC VEIN TRANSPOSITION LEFT ARM;  Surgeon: Conrad Kilmarnock, MD;  Location: [place];  Service: Vascular;  Laterality: Left;  . BASCILIC VEIN TRANSPOSITION Left 02/21/2018   Procedure: SECOND STAGE BASILIC VEIN TRANSPOSITION, LEFT UPPER ARM;  Surgeon: Marty Heck, MD;  Location: [place];  Service: Vascular;  Laterality: Left;  . CATARACT EXTRACTION, BILATERAL    . [place]; 1995  . EYE SURGERY Bilateral    "hemorrhaged behind my eye"  . I&D EXTREMITY Right multiple   "big toe"  . PERIPHERALLY INSERTED CENTRAL CATHETER INSERTION Right 08/31/2012  . PICC LINE REMOVAL ([place] [facility]) Right 10/12/2012   /  notes 10/12/2012  . REFRACTIVE SURGERY Bilateral 2013  . TOE AMPUTATION Right 11/2012   "big toe"  . TONSILLECTOMY  1965  . TOTAL THYROIDECTOMY  2012  . TUBAL LIGATION  1995    Allergies  Allergen Reactions  . Erythromycin Anaphylaxis  . Norvasc [Amlodipine] Shortness Of Breath and Swelling  . Vancomycin Shortness Of Breath and Other (See Comments)    Red Man Syndrome Has patient had a PCN reaction causing immediate rash, facial/tongue/throat swelling, SOB or lightheadedness with hypotension: unk Has patient had a PCN reaction causing severe rash involving mucus membranes or skin necrosis: unk Has patient had a PCN  reaction that required hospitalization: unk Has patient had a PCN reaction occurring within the last 10 years: unk If all of the above answers are "NO", then may proceed with Cephalosporin use.    . Nsaids Other (See Comments)    Avoid due to CKD  . Wellbutrin [Bupropion] Other (See Comments)          Current Outpatient Medications  Medication Sig Dispense Refill  . calcium carbonate (TUMS EX) 750 MG chewable tablet Chew 1 tablet by mouth daily as needed. Calcium Supplement     . carvedilol (COREG) 12.5 MG tablet Take 12.5 mg by mouth 2 (two) times daily with a meal.     . cholecalciferol (VITAMIN D) 1000 units tablet Take 1,000 Units by mouth daily.    . clonazePAM (KLONOPIN) 1 MG tablet Take 1 mg at bed time 30 tablet 2  . dorzolamide-timolol (COSOPT) 22.3-6.8 MG/ML ophthalmic solution Place 1 drop into both eyes 2 (two) times daily.     . DULoxetine (CYMBALTA) 60 MG capsule Take 1 capsule (60 mg total) by mouth daily. 30 capsule 2  . furosemide (LASIX) 40 MG tablet Take 40 mg by mouth 2 (two) times daily.    Marland Kitchen glucose blood (CONTOUR NEXT TEST) test strip one strip (1 each total) by Other route 4 (four) times daily. Use as directed. Pharmacy, please dispense this brand of blood glucose test strips: Bayer Contour Next    . hydrALAZINE (APRESOLINE) 25 MG tablet TK 1 T PO BID  5  . insulin glargine (LANTUS) 100 UNIT/ML injection Inject 40 Units into the skin 2 (two) times daily.    . insulin lispro (HUMALOG) 100 UNIT/ML injection Inject 20 Units into the skin See admin instructions. Inject up to 25 units subcutaneously three times daily before meals if needed Per sliding scale as directed    . levothyroxine (SYNTHROID, LEVOTHROID) 150 MCG tablet Take 150 mcg by mouth daily before breakfast.    . lisinopril (PRINIVIL,ZESTRIL) 20 MG tablet Take 20 mg by mouth daily.   5  . Omega-3 1000 MG CAPS Take 1,000 mg by mouth daily.     . RESTASIS 0.05 % ophthalmic emulsion  Place 1 drop into both eyes 2 (two) times daily.  11  . rosuvastatin (CRESTOR) 40 MG tablet Take 40 mg by mouth daily.    . vitamin C (ASCORBIC ACID) 500 MG tablet Take 500 mg by mouth daily.     No current facility-administered medications for this visit.          Family History  Problem Relation Age of Onset  . Depression Mother   . Diabetes Mother   . Alcohol abuse Father   . Alcohol abuse Sister   . Diabetes Maternal Grandmother     Social History        Socioeconomic History  . Marital status: Single  Spouse name: Not on file  . Number of children: Not on file  . Years of education: Not on file  . Highest education level: Not on file  Occupational History  . Not on file  Social Needs  . Financial resource strain: Not on file  . Food insecurity:    Worry: Not on file    Inability: Not on file  . Transportation needs:    Medical: Not on file    Non-medical: Not on file  Tobacco Use  . Smoking status: Never Smoker  . Smokeless tobacco: Never Used  Substance and Sexual Activity  . Alcohol use: No    Alcohol/week: 0.0 standard drinks  . Drug use: No  . Sexual activity: Not Currently  Lifestyle  . Physical activity:    Days per week: Not on file    Minutes per session: Not on file  . Stress: Not on file  Relationships  . Social connections:    Talks on phone: Not on file    Gets together: Not on file    Attends religious service: Not on file    Active member of club or organization: Not on file    Attends meetings of clubs or organizations: Not on file    Relationship status: Not on file  . Intimate partner violence:    Fear of current or ex partner: Not on file    Emotionally abused: Not on file    Physically abused: Not on file    Forced sexual activity: Not on file  Other Topics Concern  . Not on file  Social History Narrative   Lives in [place] with daughter and father. Completely independent  in ADLs, IADLs.     ROS: [x]  Positive   [ ]  Negative   [ ]  All sytems reviewed and are negative  Cardiac: []  chest pain/pressure []  SOB []  DOE  Vascular: []  pain in legs while walking []  pain in feet when lying flat []  hx of DVT []  swelling in legs  Pulmonary: []  asthma []  wheezing  Neurologic: []  weakness in []  arms []  legs []  numbness in []  arms []  legs [] difficulty speaking or slurred speech []  temporary loss of vision in one eye []  dizziness  Hematologic: []  bleeding problems  GI []  GERD  GU: [x]  CKD/renal failure  [x]  HD---[x]  M/W/F []  T/T/S []  burning with urination []  blood in urine  Psychiatric: []  hx of major depression  Integumentary: []  rashes []  ulcers  Constitutional: []  fever []  chills  PHYSICAL EXAMINATION:     Today's Vitals   08/31/18 0917  BP: 136/70  Pulse: (!) 102  Resp: 14  Temp: 98 F (36.7 C)  TempSrc: Oral  SpO2: 97%  Weight: 159 lb 2.8 oz (72.2 kg)  Height: 5\' 3"  (1.6 m)   Body mass index is 28.2 kg/m.    General:  WDWN female in NAD Gait: Not observed HENT: WNL Pulmonary: normal non-labored breathing , without Rales, rhonchi,  wheezing Cardiac: regular, without  Murmurs without carotid bruits Abdomen: soft, NT, no masses Skin: ecchymosis over fistula  Vascular Exam/Pulses:   Right Left  Radial 2+ (normal) 2+ (normal)  PT 2+ (normal) 2+ (normal)   Extremities:  Fistula is pulsatile Musculoskeletal: no muscle wasting or atrophy       Neurologic: A&O X 3; Moving all extremities equally;  Speech is fluent/normal  Non-Invasive Vascular Imaging:   none   ASSESSMENT/PLAN: 58 y.o. female  here for evaluation of her hemodialysis access.  She  has had dialysis 3x and two of those sessions, they had difficulty cannulating her fistula.  She ate this morning at 8am.  Will schedule for fistulogram and possible tunneled catheter placement this afternoon at 1pm.  Will schedule her dialysis for  tomorrow (off day).  Instructed her not to eat or drink the rest of the day.    Leontine Locket, PA-C [facility] [phone]  Clinic MD:   Oneida Alar

## 2018-08-31 NOTE — Op Note (Addendum)
OPERATIVE NOTE   PROCEDURE: 1. Left brachiobascilic arteriovenous cannulation under ultrasound guidance 2. Left arm fistulogram including central venogram 3. Left basilic vein outflow venoplasty (4 mm, 6 mm, 7 mm Mustang and 7 mm Lutonix) and angioplasty of the arterial anastomosis (4 mm Mustang) 4. US guided access of right internal jugular vein 5. Placement of right internal jugular vein tunneled dialysis catheter (19 cm Palindrome catheter)  PRE-OPERATIVE DIAGNOSIS: Malfunctioning left arteriovenous fistula  POST-OPERATIVE DIAGNOSIS: same as above   SURGEON: Marty Heck, MD  ANESTHESIA: local  ESTIMATED BLOOD LOSS: 5 cc  FINDING(S): 1. There was a greater than 95% stenosis of the basilic vein outflow in the mid upper arm that was angioplastied with a 4 mm, 6 mm, 7 mm Mustang and then 7 mm drug-coated Lutonix balloon.  Our reflux shot also suggested there was a second stenotic lesion just past the arterial anastomosis and we subsequently reaccessed the fistula retrograde and the arterial anastomosis and proximal portion of the fistula was angioplastied with a 4 mm Mustang.  At the completion of the case there was a great thrill in the fistula but unfortunately Suzanne Allen had significant ecchymosis and bruising and hematoma of the entire upper arm after repeated access in dialysis prior to arrival today.  As a result I placed a right IJ tunneled 19 cm palindrome catheter so that Suzanne Allen can temporarily dialyze while her arm hematoma resolves and in the case that the fistula does not work well after intervention.  SPECIMEN(S):  None  CONTRAST: 65 cc  INDICATIONS: Suzanne Allen is a 58 y.o. female who  presents with malfunctioning left arteriovenous fistula.  The patient is scheduled for left arm fistulogram and possible placement of tunneled cathter for dialysis.  The patient is aware the risks include but are not limited to: bleeding, infection, thrombosis of the cannulated access,  and possible anaphylactic reaction to the contrast, pneumothorax, vessel injury, etc..  The patient is aware of the risks of the procedure and elects to proceed forward.  DESCRIPTION: After full informed written consent was obtained, the patient was brought back to the angiography suite and placed supine upon the angiography table.  The patient was connected to monitoring equipment.  The left arm was prepped and draped in the standard fashion for a left arm fisrtulogram.  Under ultrasound guidance, the left basilic vein was evaluated, an image was saved, it was patent.  The left brachiobasilic fistula was cannulated with a micropuncture needle under ultrasound guidance.  The microwire was advanced into the fistula and the needle was exchanged for the a microsheath, which was lodged 2 cm into the access.  The wire was removed and the sheath was connected to the IV extension tubing.  Hand injections were completed to image the access from the antecubitum up to the level of axilla.  The central venous structures were also imaged by hand injections.  Based on these initial images there was a outflow stenosis in the basilic vein in the mid upper arm.  We subsequently used a Bentson wire to exchange for a short 6 [place] sheath.  The patient was given 3000 units IV heparin.  I could not get my Bentson wire to advance past the 95% stenosis in the basilic vein and use a soft angled glide to get past this.  Given the degree of stenosis we sequentially dilated this with a 4 mm x 40 mm Mustang, 6 mm x 40 Mustang and 7 mm x 40 mm Mustang.  Happy with the results with less than 30% residual stenosis I then agioplastied this with a 7 mm x 60 mm drug-coated Lutonix after exchanged for a 7 [place] sheath.  At that point in time a pursestring was placed and the sheath was removed but on a reflux shot prior to removal we noted that there appeared to be a stenosis just past the arterial anastomosis in the proximal portion of the  fistula.  As a result I reaccessed the fistula in retrograde fashion with ultrasound guidance and placed a micro access sheath.  We then exchanged for a short 5 [place] sheath.  I did use a soft angled glide and a KMP catheter to get across the proximal stenosis and into her native brachial artery.  At that time Suzanne Allen was given another 3000 units IV heparin.  I then used a short 4 mm x 40 mm Mustang and angioplasty was performed across the arterial anastomosis in the proximal portion of the fistula.  Happy with the results then wires and catheters were removed to place a second pursestring and the 6 French sheath was removed and pressure was held.  There was now a thrill in the fistula but the patient had significant ecchymosis bruising hematoma to her arm after repeated access in dialysis. I discussed with patient the options for placement of a tunneled catheter while her arm hematoma resolves and in the instance that this fistula does not work well given multiple defects that were addressed today and Suzanne Allen missed dialysis.  Suzanne Allen elected to go ahead with tunneled catheter placement.  Her right neck and chest wall were then prepped and draped in sterile fashion.  I then used sterile ultrasound probe to identify the right internal jugular vein that was very small and collapsed.  It was patent and an ultrasound image was saved.  Ultimately 18-gauge needle was advanced under ultrasound guidance into the right internal jugular vein and advanced the J-wire into her right atrium.  I then made a stab incision both at the needle insertion site in the neck and on her chest wall after measuring a 19 cm palindrome catheter on the chest.  I then tunneled from the chest exit site to the IJ stick site and then the catheter was brought back through this tunnel after being flushed and transected and tunneled in retrograde fashion.  I then use sequential dilators under fluoroscopy over the J-wire and then placed a large peel-away dilator  sheath.  The inner dilator was removed and then under fluoroscopy a 19 cm palindrome catheter was advanced into the right atrium and the sheath was peeled away.  I looked at the top of the catheter to ensure there were no kinks.  The catheter was then cut a second time and the adapter was placed.  It flushed and aspirated easily.  Catheter was then locked according to manufacture recommendations.  The small stab incision on her neck was closed with a subcuticular 4-0 Monocryl.  The catheter was secured to the chest wall with 3-0 nylon both at the chest wall exit site and at a second site.  Dry sterile dressings were applied.  Suzanne Allen tolerated the procedure without any pain complications.  COMPLICATIONS: None  CONDITION: Stable  Marty Heck, MD [facility] Office: [phone] Pager: [phone]  08/31/2018 6:11 PM

## 2018-09-01 ENCOUNTER — Encounter (HOSPITAL_COMMUNITY): Payer: Self-pay | Admitting: Vascular Surgery

## 2018-09-01 ENCOUNTER — Telehealth: Payer: Self-pay | Admitting: Vascular Surgery

## 2018-09-01 NOTE — Telephone Encounter (Signed)
Father came to office stating the kidney center needs appt with Carlis Abbott to run dye in fistuals to be certain they are open for dialysis to resume. Per triage pt informed to have kidney center call [facility] to communicate what specifically they need scheduled.

## 2018-11-01 ENCOUNTER — Encounter: Payer: Self-pay | Admitting: *Deleted

## 2018-11-01 ENCOUNTER — Ambulatory Visit (HOSPITAL_COMMUNITY)
Admission: RE | Admit: 2018-11-01 | Discharge: 2018-11-01 | Disposition: A | Discharge status: Home / Self Care | Payer: Medicaid Other | Source: Ambulatory Visit | Attending: Family | Admitting: Family

## 2018-11-01 ENCOUNTER — Other Ambulatory Visit: Payer: Self-pay

## 2018-11-01 ENCOUNTER — Encounter: Payer: Self-pay | Admitting: Vascular Surgery

## 2018-11-01 ENCOUNTER — Ambulatory Visit (INDEPENDENT_AMBULATORY_CARE_PROVIDER_SITE_OTHER): Payer: Medicaid Other | Admitting: Vascular Surgery

## 2018-11-01 ENCOUNTER — Other Ambulatory Visit: Payer: Self-pay | Admitting: *Deleted

## 2018-11-01 DIAGNOSIS — N186 End stage renal disease: Secondary | ICD-10-CM | POA: Insufficient documentation

## 2018-11-01 DIAGNOSIS — Z992 Dependence on renal dialysis: Secondary | ICD-10-CM | POA: Diagnosis present

## 2018-11-01 NOTE — Progress Notes (Addendum)
Patient name: Suzanne Allen MRN: [id number] DOB: 1960/09/16 Sex: female  REASON FOR VISIT: Evaluate left arm brachiobasilic fistula  HPI: Suzanne Allen is a 58 y.o. female presents for evaluation of her left brachiobasilic fistula.  She underwent left first stage basilic vein transposition on 12/31/2017 by Dr. Bridgett Larsson.  I subsequently performed the second stage transposition on 02/21/2018.  She was evaluated earlier this year and had a large arm hematoma from infiltration of the fistula we subsequently performed a fistulogram where she had a arterial anastomosis stenosis as well as an upper arm stenosis in the vein that was all angioplastied.  We did subsequently put in a tunneled catheter and she had been resting the fistula in the interim.  Patient states they started using the fistula after the hematoma resolved about a month ago.  She states they are only able to use it for about an hour before it has issues with low flow volume.  Most the time they also have some difficulty sticking the fistula.  Catheter in her right IJ has been working well with no issues.  Past Medical History:  Diagnosis Date  . 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 Anemia   . Anxiety   . CAP (community acquired pneumonia) 03/13/2015  . Carpal tunnel syndrome 11/28/2014   Bilateral  . Chronic kidney disease    ESRD  . Chronic pancreatitis ([place])   . Depression   . Diabetes mellitus type I ([place]) dx'd 1977  . Diabetic osteomyelitis ([place])    right great toe/progress notes 09/23/2012  . Diabetic retinopathy ([place])   . Fatty liver 04/04/2014  . HTN (hypertension)   . Hypercholesteremia   . Hypothyroidism   . Macular degeneration, bilateral   . Migraines 1980's   "when I was in my 20's" (03/14/2015)  . Peripheral neuropathy   . Pneumonia 1993  . PTSD (post-traumatic stress disorder)   . Stroke [facility]) ? date & 09/22/2012  . TIA (transient ischemic attack) 09/22/2012   [name] 10/25/2012    Past Surgical  History:  Procedure Laterality Date  . A/V FISTULAGRAM N/A 08/31/2018   Procedure: A/V FISTULAGRAM - Left arm;  Surgeon: Marty Heck, MD;  Location: [place] [facility];  Service: Cardiovascular;  Laterality: N/A;  . AMPUTATION Right 07/12/2015   Procedure: Right Transmetatarsal amputation;  Surgeon: Newt Minion, MD;  Location: [place];  Service: Orthopedics;  Laterality: Right;  . BASCILIC VEIN TRANSPOSITION Left 12/31/2017   Procedure: FIRST STAGE BASILIC VEIN TRANSPOSITION LEFT ARM;  Surgeon: Conrad [place], MD;  Location: [place];  Service: Vascular;  Laterality: Left;  . BASCILIC VEIN TRANSPOSITION Left 02/21/2018   Procedure: SECOND STAGE BASILIC VEIN TRANSPOSITION, LEFT UPPER ARM;  Surgeon: Marty Heck, MD;  Location: [place];  Service: Vascular;  Laterality: Left;  . CATARACT EXTRACTION, BILATERAL    . [place]; 1995  . EYE SURGERY Bilateral    "hemorrhaged behind my eye"  . I&D EXTREMITY Right multiple   "big toe"  . PERIPHERAL VASCULAR INTERVENTION Left 08/31/2018   Procedure: PERIPHERAL VASCULAR INTERVENTION;  Surgeon: Marty Heck, MD;  Location: [place] [facility];  Service: Cardiovascular;  Laterality: Left;  . PERIPHERALLY INSERTED CENTRAL CATHETER INSERTION Right 08/31/2012  . PICC LINE REMOVAL ([place] HX) Right 10/12/2012   [name] 10/12/2012  . REFRACTIVE SURGERY Bilateral 2013  . TOE AMPUTATION Right 11/2012   "big toe"  . TONSILLECTOMY  1965  . TOTAL THYROIDECTOMY  2012  . TUBAL LIGATION  1995    Family History  Problem Relation Age of Onset  . Depression Mother   . Diabetes Mother   . Alcohol abuse Father   . Alcohol abuse Sister   . Diabetes Maternal Grandmother     SOCIAL HISTORY: Social History   Tobacco Use  . Smoking status: Never Smoker  . Smokeless tobacco: Never Used  Substance Use Topics  . Alcohol use: No    Alcohol/week: 0.0 standard drinks    Allergies  Allergen Reactions  . Erythromycin Anaphylaxis  . Norvasc  [Amlodipine] Shortness Of Breath and Swelling  . Vancomycin Shortness Of Breath and Other (See Comments)    Red Man Syndrome Has patient had a PCN reaction causing immediate rash, facial/tongue/throat swelling, SOB or lightheadedness with hypotension: unk Has patient had a PCN reaction causing severe rash involving mucus membranes or skin necrosis: unk Has patient had a PCN reaction that required hospitalization: unk Has patient had a PCN reaction occurring within the last 10 years: unk If all of the above answers are "NO", then may proceed with Cephalosporin use.    . Nsaids Other (See Comments)    Avoid due to CKD  . Wellbutrin [Bupropion] Other (See Comments)    Current Outpatient Medications  Medication Sig Dispense Refill  . calcium carbonate (TUMS EX) 750 MG chewable tablet Chew 1 tablet by mouth daily as needed. Calcium Supplement     . carvedilol (COREG) 12.5 MG tablet Take 12.5 mg by mouth 2 (two) times daily with a meal.     . cholecalciferol (VITAMIN D) 1000 units tablet Take 1,000 Units by mouth daily.    . clonazePAM (KLONOPIN) 1 MG tablet Take 1 mg at bed time 30 tablet 2  . dorzolamide-timolol (COSOPT) 22.3-6.8 MG/ML ophthalmic solution Place 1 drop into both eyes 2 (two) times daily.     . DULoxetine (CYMBALTA) 60 MG capsule Take 1 capsule (60 mg total) by mouth daily. 30 capsule 2  . glucose blood (CONTOUR NEXT TEST) test strip one strip (1 each total) by Other route 4 (four) times daily. Use as directed. Pharmacy, please dispense this brand of blood glucose test strips: Bayer Contour Next    . hydrALAZINE (APRESOLINE) 25 MG tablet Take 25 mg by mouth 3 (three) times daily.   5  . insulin glargine (LANTUS) 100 UNIT/ML injection Inject 40 Units into the skin 2 (two) times daily.    . insulin lispro (HUMALOG) 100 UNIT/ML injection Inject 20 Units into the skin See admin instructions. Inject up to 25 units subcutaneously three times daily before meals if needed Per sliding  scale as directed    . levothyroxine (SYNTHROID, LEVOTHROID) 150 MCG tablet Take 150 mcg by mouth daily before breakfast.    . lisinopril (PRINIVIL,ZESTRIL) 20 MG tablet Take 20 mg by mouth daily.   5  . multivitamin (RENA-VIT) TABS tablet Take 1 tablet by mouth daily.    . RESTASIS 0.05 % ophthalmic emulsion Place 1 drop into both eyes 2 (two) times daily.  11  . rosuvastatin (CRESTOR) 40 MG tablet Take 40 mg by mouth daily.    . vitamin C (ASCORBIC ACID) 500 MG tablet Take 500 mg by mouth daily.     No current facility-administered medications for this visit.     REVIEW OF SYSTEMS:  [X]  denotes positive finding, [ ]  denotes negative finding Cardiac  Comments:  Chest pain or chest pressure:    Shortness of breath upon exertion:  Short of breath when lying flat:    Irregular heart rhythm:        Vascular    Pain in calf, thigh, or hip brought on by ambulation:    Pain in feet at night that wakes you up from your sleep:     Blood clot in your veins:    Leg swelling:         Pulmonary    Oxygen at home:    Productive cough:     Wheezing:         Neurologic    Sudden weakness in arms or legs:     Sudden numbness in arms or legs:     Sudden onset of difficulty speaking or slurred speech:    Temporary loss of vision in one eye:     Problems with dizziness:         Gastrointestinal    Blood in stool:     Vomited blood:         Genitourinary    Burning when urinating:     Blood in urine:        Psychiatric    Major depression:         Hematologic    Bleeding problems:    Problems with blood clotting too easily:        Skin    Rashes or ulcers:        Constitutional    Fever or chills:      PHYSICAL EXAM: There were no vitals filed for this visit.  GENERAL: The patient is a well-nourished female, in no acute distress. The vital signs are documented above. CARDIAC: There is a regular rate and rhythm.  VASCULAR:  Left brachiobasilic fistula with thrill near  antecubitum and pulse in upper arm. Left radial pulse palpable PULMONARY: There is good air exchange bilaterally without wheezing or rales. ABDOMEN: Soft and non-tender with normal pitched bowel sounds.  MUSCULOSKELETAL: There are no major deformities or cyanosis. NEUROLOGIC: No focal weakness or paresthesias are detected.   DATA:    Reviewed her fistula duplex that shows she has an anastomotic stenosis as well as a second stenosis in her upper arm fistula that was previously noted on fistulogram.  Also some evidence of partially occlusive thrombus near AVF anastomosis.  Assessment/Plan:  I had a long discussion with Ms. Snapp regarding the best steps moving forward.  As previously noted on her last fistulogram she has a stenosis at the arterial anastomosis as well as a second stenosis in the upper arm basilic vein.  We angioplastied both of these back in February and she is still having issues with flow volumes.  I discussed that I could be more aggressive and potentially stent the venous stenosis in the upper arm but I am limited to just angioplasty of the arterial anastomosis stenosis or surgical revision.  She does have a thrill proximally but a pulse in her upper arm.  I recommended at least one more attempt at fistulogram/intervention this Friday.  Discussed if this fails and/or the fistula still has issues with flow volume we can convert her to an upper arm graft.  She has no other usable superficial vein in the left arm and wants to try and save her right arm (only a usable basilic vein in right arm which I discussed would be an option as well).   I understand Dr. Jimmy Footman is trying to get her catheter out as soon as possible an we will try and  help him with that.  All the hematoma has resolved since my last evaluation of her fistula.  She will dialyze Sat after intervention and discussed if this does not work after intervention, let our office know and will arrange left upper arm graft.    Marty Heck, MD [facility] Office: [phone] Pager: [place]

## 2018-11-03 ENCOUNTER — Telehealth: Payer: Self-pay | Admitting: *Deleted

## 2018-11-03 NOTE — Telephone Encounter (Signed)
Left a voice mail message on phone. To arrive at [facility] admitting at 9:30 am on 11/04/2018 for procedure with Dr. Carlis Abbott. All other instructions unchanged from instruction letter received in office. Asked to call this office back to confirm.

## 2018-11-04 ENCOUNTER — Encounter (HOSPITAL_COMMUNITY)
Admission: RE | Disposition: A | Discharge status: Home / Self Care | Payer: Self-pay | Source: Home / Self Care | Attending: Vascular Surgery

## 2018-11-04 ENCOUNTER — Encounter (HOSPITAL_COMMUNITY): Payer: Self-pay | Admitting: Vascular Surgery

## 2018-11-04 ENCOUNTER — Ambulatory Visit (HOSPITAL_COMMUNITY)
Admission: RE | Admit: 2018-11-04 | Discharge: 2018-11-04 | Disposition: A | Discharge status: Home / Self Care | Payer: Medicaid Other | Attending: Vascular Surgery | Admitting: Vascular Surgery

## 2018-11-04 ENCOUNTER — Other Ambulatory Visit: Payer: Self-pay

## 2018-11-04 DIAGNOSIS — N186 End stage renal disease: Secondary | ICD-10-CM | POA: Diagnosis not present

## 2018-11-04 DIAGNOSIS — G5603 Carpal tunnel syndrome, bilateral upper limbs: Secondary | ICD-10-CM | POA: Insufficient documentation

## 2018-11-04 DIAGNOSIS — T82858A Stenosis of vascular prosthetic devices, implants and grafts, initial encounter: Secondary | ICD-10-CM | POA: Insufficient documentation

## 2018-11-04 DIAGNOSIS — Z881 Allergy status to other antibiotic agents status: Secondary | ICD-10-CM | POA: Diagnosis not present

## 2018-11-04 DIAGNOSIS — Z9851 Tubal ligation status: Secondary | ICD-10-CM | POA: Diagnosis not present

## 2018-11-04 DIAGNOSIS — I120 Hypertensive chronic kidney disease with stage 5 chronic kidney disease or end stage renal disease: Secondary | ICD-10-CM | POA: Insufficient documentation

## 2018-11-04 DIAGNOSIS — Z888 Allergy status to other drugs, medicaments and biological substances status: Secondary | ICD-10-CM | POA: Diagnosis not present

## 2018-11-04 DIAGNOSIS — E1022 Type 1 diabetes mellitus with diabetic chronic kidney disease: Secondary | ICD-10-CM | POA: Diagnosis not present

## 2018-11-04 DIAGNOSIS — Z89421 Acquired absence of other right toe(s): Secondary | ICD-10-CM | POA: Insufficient documentation

## 2018-11-04 DIAGNOSIS — Z79899 Other long term (current) drug therapy: Secondary | ICD-10-CM | POA: Insufficient documentation

## 2018-11-04 DIAGNOSIS — Z833 Family history of diabetes mellitus: Secondary | ICD-10-CM | POA: Diagnosis not present

## 2018-11-04 DIAGNOSIS — E7800 Pure hypercholesterolemia, unspecified: Secondary | ICD-10-CM | POA: Diagnosis not present

## 2018-11-04 DIAGNOSIS — Z79890 Hormone replacement therapy (postmenopausal): Secondary | ICD-10-CM | POA: Insufficient documentation

## 2018-11-04 DIAGNOSIS — Z886 Allergy status to analgesic agent status: Secondary | ICD-10-CM | POA: Diagnosis not present

## 2018-11-04 DIAGNOSIS — E10319 Type 1 diabetes mellitus with unspecified diabetic retinopathy without macular edema: Secondary | ICD-10-CM | POA: Diagnosis not present

## 2018-11-04 DIAGNOSIS — E1040 Type 1 diabetes mellitus with diabetic neuropathy, unspecified: Secondary | ICD-10-CM | POA: Insufficient documentation

## 2018-11-04 DIAGNOSIS — E039 Hypothyroidism, unspecified: Secondary | ICD-10-CM | POA: Insufficient documentation

## 2018-11-04 DIAGNOSIS — Z794 Long term (current) use of insulin: Secondary | ICD-10-CM | POA: Diagnosis not present

## 2018-11-04 DIAGNOSIS — Z8673 Personal history of transient ischemic attack (TIA), and cerebral infarction without residual deficits: Secondary | ICD-10-CM | POA: Insufficient documentation

## 2018-11-04 DIAGNOSIS — T82898A Other specified complication of vascular prosthetic devices, implants and grafts, initial encounter: Secondary | ICD-10-CM

## 2018-11-04 DIAGNOSIS — Y832 Surgical operation with anastomosis, bypass or graft as the cause of abnormal reaction of the patient, or of later complication, without mention of misadventure at the time of the procedure: Secondary | ICD-10-CM | POA: Diagnosis not present

## 2018-11-04 HISTORY — PX: PERIPHERAL VASCULAR INTERVENTION: CATH118257

## 2018-11-04 HISTORY — PX: A/V FISTULAGRAM: CATH118298

## 2018-11-04 LAB — POCT I-STAT 4, (NA,K, GLUC, HGB,HCT)
Glucose, Bld: 116 mg/dL — ABNORMAL HIGH (ref 70–99)
HCT: 39 % (ref 36.0–46.0)
Hemoglobin: 13.3 g/dL (ref 12.0–15.0)
Potassium: 4.3 mmol/L (ref 3.5–5.1)
Sodium: 138 mmol/L (ref 135–145)

## 2018-11-04 LAB — POCT I-STAT CREATININE: Creatinine, Ser: 6.4 mg/dL — ABNORMAL HIGH (ref 0.44–1.00)

## 2018-11-04 SURGERY — A/V FISTULAGRAM
Anesthesia: LOCAL

## 2018-11-04 MED ORDER — HEPARIN SODIUM (PORCINE) 1000 UNIT/ML IJ SOLN
INTRAMUSCULAR | Status: DC | PRN
  Administered 2018-11-04: 3000 [IU] via INTRAVENOUS

## 2018-11-04 MED ORDER — SODIUM CHLORIDE 0.9 % IV SOLN: 250.0000 mL | INTRAVENOUS | Status: DC | PRN

## 2018-11-04 MED ORDER — SODIUM CHLORIDE 0.9% FLUSH: 3.0000 mL | Freq: Two times a day (BID) | INTRAVENOUS | Status: DC

## 2018-11-04 MED ORDER — LIDOCAINE HCL (PF) 1 % IJ SOLN
INTRAMUSCULAR | Status: AC
  Filled 2018-11-04: qty 30

## 2018-11-04 MED ORDER — LIDOCAINE HCL (PF) 1 % IJ SOLN
INTRAMUSCULAR | Status: DC | PRN
  Administered 2018-11-04: 2 mL

## 2018-11-04 MED ORDER — SODIUM CHLORIDE 0.9% FLUSH: 3.0000 mL | INTRAVENOUS | Status: DC | PRN

## 2018-11-04 MED ORDER — HEPARIN (PORCINE) IN NACL 1000-0.9 UT/500ML-% IV SOLN
INTRAVENOUS | Status: DC | PRN
  Administered 2018-11-04: 500 mL

## 2018-11-04 MED ORDER — IODIXANOL 320 MG/ML IV SOLN
INTRAVENOUS | Status: DC | PRN
  Administered 2018-11-04: 12:00:00 30 mL via INTRAVENOUS

## 2018-11-04 SURGICAL SUPPLY — 17 items
BAG SNAP BAND KOVER 36X36 (MISCELLANEOUS) ×3 IMPLANT
BALLN MUSTANG 7.0X40 75 (BALLOONS) ×3
BALLOON MUSTANG 7.0X40 75 (BALLOONS) IMPLANT
COVER DOME SNAP 22 D (MISCELLANEOUS) ×3 IMPLANT
KIT ENCORE 26 ADVANTAGE (KITS) ×1 IMPLANT
KIT MICROPUNCTURE NIT STIFF (SHEATH) ×1 IMPLANT
PROTECTION STATION PRESSURIZED (MISCELLANEOUS) ×3
SHEATH PINNACLE R/O II 7F 4CM (SHEATH) ×1 IMPLANT
SHEATH PROBE COVER 6X72 (BAG) ×3 IMPLANT
STATION PROTECTION PRESSURIZED (MISCELLANEOUS) ×2 IMPLANT
STENT VIABAHN 7X50X120 (Permanent Stent) ×1 IMPLANT
STENT VIABAHN 7X5X120 7FR (Permanent Stent) IMPLANT
STOPCOCK MORSE 400PSI 3WAY (MISCELLANEOUS) ×3 IMPLANT
TRAY PV CATH (CUSTOM PROCEDURE TRAY) ×3 IMPLANT
TUBING CIL FLEX 10 FLL-RA (TUBING) ×3 IMPLANT
WIRE BENTSON .035X145CM (WIRE) ×1 IMPLANT
WIRE SPARTACORE .014X300CM (WIRE) ×1 IMPLANT

## 2018-11-04 NOTE — Discharge Instructions (Signed)

## 2018-11-04 NOTE — H&P (Signed)
History and Physical Interval Note:  11/04/2018 11:16 AM  [name]  has presented today for surgery, with the diagnosis of poor flow.  The various methods of treatment have been discussed with the patient and family. After consideration of risks, benefits and other options for treatment, the patient has consented to  Procedure(s): A/V FISTULAGRAM - Left Arm (N/A) as a surgical intervention.  The patient's history has been reviewed, patient examined, no change in status, stable for surgery.  I have reviewed the patient's chart and labs.  Questions were answered to the patient's satisfaction.    Left arm fistulogram  Marty Heck  Patient name: Suzanne Allen MRN: [id number]        DOB: Jan 22, 1961          Sex: female  REASON FOR VISIT: Evaluate left arm brachiobasilic fistula  HPI: Suzanne Allen is a 58 y.o. female presents for evaluation of her left brachiobasilic fistula.  She underwent left first stage basilic vein transposition on 12/31/2017 by Dr. Bridgett Larsson.  I subsequently performed the second stage transposition on 02/21/2018.  She was evaluated earlier this year and had a large arm hematoma from infiltration of the fistula we subsequently performed a fistulogram where she had a arterial anastomosis stenosis as well as an upper arm stenosis in the vein that was all angioplastied.  We did subsequently put in a tunneled catheter and she had been resting the fistula in the interim.  Patient states they started using the fistula after the hematoma resolved about a month ago.  She states they are only able to use it for about an hour before it has issues with low flow volume.  Most the time they also have some difficulty sticking the fistula.  Catheter in her right IJ has been working well with no issues.      Past Medical History:  Diagnosis Date  . 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 Anemia   . Anxiety   . CAP (community acquired pneumonia) 03/13/2015  .  Carpal tunnel syndrome 11/28/2014   Bilateral  . Chronic kidney disease    ESRD  . Chronic pancreatitis ([place])   . Depression   . Diabetes mellitus type I ([place]) dx'd 1977  . Diabetic osteomyelitis ([place])    right great toe/progress notes 09/23/2012  . Diabetic retinopathy ([place])   . Fatty liver 04/04/2014  . HTN (hypertension)   . Hypercholesteremia   . Hypothyroidism   . Macular degeneration, bilateral   . Migraines 1980's   "when I was in my 20's" (03/14/2015)  . Peripheral neuropathy   . Pneumonia 1993  . PTSD (post-traumatic stress disorder)   . Stroke [facility]) ? date & 09/22/2012  . TIA (transient ischemic attack) 09/22/2012   [name] 10/25/2012         Past Surgical History:  Procedure Laterality Date  . A/V FISTULAGRAM N/A 08/31/2018   Procedure: A/V FISTULAGRAM - Left arm;  Surgeon: Marty Heck, MD;  Location: [place] [facility];  Service: Cardiovascular;  Laterality: N/A;  . AMPUTATION Right 07/12/2015   Procedure: Right Transmetatarsal amputation;  Surgeon: Newt Minion, MD;  Location: [place];  Service: Orthopedics;  Laterality: Right;  . BASCILIC VEIN TRANSPOSITION Left 12/31/2017   Procedure: FIRST STAGE BASILIC VEIN TRANSPOSITION LEFT ARM;  Surgeon: Conrad [place], MD;  Location: [place];  Service: Vascular;  Laterality: Left;  . BASCILIC VEIN TRANSPOSITION Left 02/21/2018   Procedure: SECOND STAGE BASILIC VEIN TRANSPOSITION,  LEFT UPPER ARM;  Surgeon: Marty Heck, MD;  Location: [place];  Service: Vascular;  Laterality: Left;  . CATARACT EXTRACTION, BILATERAL    . [place]; 1995  . EYE SURGERY Bilateral    "hemorrhaged behind my eye"  . I&D EXTREMITY Right multiple   "big toe"  . PERIPHERAL VASCULAR INTERVENTION Left 08/31/2018   Procedure: PERIPHERAL VASCULAR INTERVENTION;  Surgeon: Marty Heck, MD;  Location: [place] [facility];  Service: Cardiovascular;  Laterality: Left;  . PERIPHERALLY INSERTED CENTRAL  CATHETER INSERTION Right 08/31/2012  . PICC LINE REMOVAL ([place] HX) Right 10/12/2012   [name] 10/12/2012  . REFRACTIVE SURGERY Bilateral 2013  . TOE AMPUTATION Right 11/2012   "big toe"  . TONSILLECTOMY  1965  . TOTAL THYROIDECTOMY  2012  . TUBAL LIGATION  1995         Family History  Problem Relation Age of Onset  . Depression Mother   . Diabetes Mother   . Alcohol abuse Father   . Alcohol abuse Sister   . Diabetes Maternal Grandmother     SOCIAL HISTORY: Social History        Tobacco Use  . Smoking status: Never Smoker  . Smokeless tobacco: Never Used  Substance Use Topics  . Alcohol use: No    Alcohol/week: 0.0 standard drinks         Allergies  Allergen Reactions  . Erythromycin Anaphylaxis  . Norvasc [Amlodipine] Shortness Of Breath and Swelling  . Vancomycin Shortness Of Breath and Other (See Comments)    Red Man Syndrome Has patient had a PCN reaction causing immediate rash, facial/tongue/throat swelling, SOB or lightheadedness with hypotension: unk Has patient had a PCN reaction causing severe rash involving mucus membranes or skin necrosis: unk Has patient had a PCN reaction that required hospitalization: unk Has patient had a PCN reaction occurring within the last 10 years: unk If all of the above answers are "NO", then may proceed with Cephalosporin use.    . Nsaids Other (See Comments)    Avoid due to CKD  . Wellbutrin [Bupropion] Other (See Comments)          Current Outpatient Medications  Medication Sig Dispense Refill  . calcium carbonate (TUMS EX) 750 MG chewable tablet Chew 1 tablet by mouth daily as needed. Calcium Supplement     . carvedilol (COREG) 12.5 MG tablet Take 12.5 mg by mouth 2 (two) times daily with a meal.     . cholecalciferol (VITAMIN D) 1000 units tablet Take 1,000 Units by mouth daily.    . clonazePAM (KLONOPIN) 1 MG tablet Take 1 mg at bed time 30 tablet 2  . dorzolamide-timolol (COSOPT) 22.3-6.8  MG/ML ophthalmic solution Place 1 drop into both eyes 2 (two) times daily.     . DULoxetine (CYMBALTA) 60 MG capsule Take 1 capsule (60 mg total) by mouth daily. 30 capsule 2  . glucose blood (CONTOUR NEXT TEST) test strip one strip (1 each total) by Other route 4 (four) times daily. Use as directed. Pharmacy, please dispense this brand of blood glucose test strips: Bayer Contour Next    . hydrALAZINE (APRESOLINE) 25 MG tablet Take 25 mg by mouth 3 (three) times daily.   5  . insulin glargine (LANTUS) 100 UNIT/ML injection Inject 40 Units into the skin 2 (two) times daily.    . insulin lispro (HUMALOG) 100 UNIT/ML injection Inject 20 Units into the skin See admin instructions. Inject up to 25 units subcutaneously three  times daily before meals if needed Per sliding scale as directed    . levothyroxine (SYNTHROID, LEVOTHROID) 150 MCG tablet Take 150 mcg by mouth daily before breakfast.    . lisinopril (PRINIVIL,ZESTRIL) 20 MG tablet Take 20 mg by mouth daily.   5  . multivitamin (RENA-VIT) TABS tablet Take 1 tablet by mouth daily.    . RESTASIS 0.05 % ophthalmic emulsion Place 1 drop into both eyes 2 (two) times daily.  11  . rosuvastatin (CRESTOR) 40 MG tablet Take 40 mg by mouth daily.    . vitamin C (ASCORBIC ACID) 500 MG tablet Take 500 mg by mouth daily.     No current facility-administered medications for this visit.     REVIEW OF SYSTEMS:  [X]  denotes positive finding, [ ]  denotes negative finding Cardiac  Comments:  Chest pain or chest pressure:    Shortness of breath upon exertion:    Short of breath when lying flat:    Irregular heart rhythm:        Vascular    Pain in calf, thigh, or hip brought on by ambulation:    Pain in feet at night that wakes you up from your sleep:     Blood clot in your veins:    Leg swelling:         Pulmonary    Oxygen at home:    Productive cough:     Wheezing:         Neurologic     Sudden weakness in arms or legs:     Sudden numbness in arms or legs:     Sudden onset of difficulty speaking or slurred speech:    Temporary loss of vision in one eye:     Problems with dizziness:         Gastrointestinal    Blood in stool:     Vomited blood:         Genitourinary    Burning when urinating:     Blood in urine:        Psychiatric    Major depression:         Hematologic    Bleeding problems:    Problems with blood clotting too easily:        Skin    Rashes or ulcers:        Constitutional    Fever or chills:      PHYSICAL EXAM: There were no vitals filed for this visit.  GENERAL: The patient is a well-nourished female, in no acute distress. The vital signs are documented above. CARDIAC: There is a regular rate and rhythm.  VASCULAR:  Left brachiobasilic fistula with thrill near antecubitum and pulse in upper arm. Left radial pulse palpable PULMONARY: There is good air exchange bilaterally without wheezing or rales. ABDOMEN: Soft and non-tender with normal pitched bowel sounds.  MUSCULOSKELETAL: There are no major deformities or cyanosis. NEUROLOGIC: No focal weakness or paresthesias are detected.   DATA:    Reviewed her fistula duplex that shows she has an anastomotic stenosis as well as a second stenosis in her upper arm fistula that was previously noted on fistulogram.  Also some evidence of partially occlusive thrombus near AVF anastomosis.  Assessment/Plan:  I had a long discussion with Ms. Carol regarding the best steps moving forward.  As previously noted on her last fistulogram she has a stenosis at the arterial anastomosis as well as a second stenosis in the upper arm basilic vein.  We angioplastied  both of these back in February and she is still having issues with flow volumes.  I discussed that I could be more aggressive and potentially stent the venous stenosis in the upper  arm but I am limited to just angioplasty of the arterial anastomosis stenosis or surgical revision.  She does have a thrill proximally but a pulse in her upper arm.  I recommended at least one more attempt at fistulogram/intervention this Friday.  Discussed if this fails and/or the fistula still has issues with flow volume we can convert her to an upper arm graft.  She has no other usable superficial vein in the left arm and wants to try and save her right arm (only a usable basilic vein in right arm which I discussed would be an option as well).   I understand Dr. Jimmy Footman is trying to get her catheter out as soon as possible an we will try and help him with that.  All the hematoma has resolved since my last evaluation of her fistula.  She will dialyze Sat after intervention and discussed if this does not work after intervention, let our office know and will arrange left upper arm graft.   Marty Heck, MD [facility] [place] Office: [phone] Pager: [place]

## 2018-11-04 NOTE — Op Note (Signed)
Patient name: Suzanne Allen MRN: [id number] DOB: Jan 19, 1961 Sex: female  11/04/2018 Pre-operative Diagnosis: Malfunctioning left brachiobasilic fistula Post-operative diagnosis:  Same Surgeon:  Marty Heck, MD Procedure Performed: 1.  Ultrasound-guided access of the left brachiobasilic fistula 2.  Left basilic vein stent placement that was postdilated (7 mm x 50 mm Viabahn angioplastied with a 7 mm Mustang)  Indications: Patient is a's 58 year old female with end-stage renal disease that previously underwent a left upper extremity brachiobasilic fistula.  This has been a poorly functioning fistula and she is currently using a right IJ tunneled dialysis catheter.  She presents today for second attempt at fistula salvage given that she has recurrent severe stenosis of the basilic vein outflow.  Findings: Left upper extremity fistulogram again demonstrated a 80 to 90% stenosis of the basilic vein in the mid upper arm with no evidence of central stenosis.  This was primarily stented with a 7 mm x 50 mm Viabahn and it was post angioplastied with a 7 mm Mustang.  There was no evidence of residual stenosis and the patient had an excellent thrill in the fistula.  Retrograde fistulogram did show that the anastomosis was patent although there did appear to be some small amount of thrombus here that was evident on ultrasound.  I elected not to access the fistula retrograde and angioplastied this given my concern it was sitting right at the anastomosis and overall it look patent.    Procedure: The patient was taken to [facility] lab 8 after informed consent was obtained.  She was placed on the procedure table in supine position and her left arm was prepped and draped in standard sterile fashion above the antecubitum where fistula was located.  I used ultrasound to evaluate her brachiobasilic fistula this was patent and image was saved.  Under ultrasound guidance her brachiobasilic fistula was then accessed in  antegrade fashion with a micro access needle and then a microwire was placed and exchanged for micro-sheath that was placed 2 cm into the access.  We then secured this and left upper extremity fistulogram was obtained from the antecubitum up to the central venous structures.  Again there was evidence of a focal severe stenosis in the mid upper arm basilic vein and we elected to primarily stent this given this had already been angioplastied before.  We used a Bentson wire and exchanged for a short 7 [place] sheath.  The patient was given 3000 units of IV heparin.  I then used a 0.018 wire to cross the stenosis and planning shot by injecting the sheath I stented this with a 7 mm x 50 mm Viabahn.  This was postdilated with a 7 mm Mustang.  Patient then had an excellent thrill and a widely patent fistula with no residual stenosis.  I did use my balloon and reinflated in the stent and then got a retrograde shot at the anastomosis.  This looked better than it did on her last fistulogram.  There was some evidence of partial occlusive thrombus on ultrasound and you could see that on the image today.  I elected not to access this retrograde given that I was concerned the proximity of thrombus to this arterial anastomosis.  I am optimistic that the stent we placed will improve function of the fistula.  If not I discussed converting her to an upper arm graft.   Condition: Stable  Marty Heck, MD [facility] [place] Office: [phone] Pager: [place]

## 2018-11-11 ENCOUNTER — Telehealth: Payer: Self-pay

## 2018-11-11 NOTE — Telephone Encounter (Signed)
Pt called and said that Dr Carlis Abbott did a procedure last Friday and they have tried to stick it and it is not flowing well. She said that she has to twist her arm funny and she cannot keep it that way for an entire session of dialysis. Thinks that she needs to go ahead and have the graft put in.   Spoke with Zigmund Daniel and she said that we need to speak directly with the [place] and have them evaluate the concerns so that we can move forward.   Spoke with patient and she will be back at dialysis on Monday and she will have them call Zigmund Daniel to discuss.   York Cerise, CMA

## 2018-11-14 ENCOUNTER — Ambulatory Visit (INDEPENDENT_AMBULATORY_CARE_PROVIDER_SITE_OTHER): Payer: Medicaid Other | Admitting: Psychiatry

## 2018-11-14 ENCOUNTER — Other Ambulatory Visit: Payer: Self-pay

## 2018-11-14 ENCOUNTER — Encounter (HOSPITAL_COMMUNITY): Payer: Self-pay | Admitting: Psychiatry

## 2018-11-14 DIAGNOSIS — F330 Major depressive disorder, recurrent, mild: Secondary | ICD-10-CM | POA: Diagnosis not present

## 2018-11-14 DIAGNOSIS — F411 Generalized anxiety disorder: Secondary | ICD-10-CM

## 2018-11-14 MED ORDER — DULOXETINE HCL 60 MG PO CPEP: 60.0000 mg | ORAL_CAPSULE | Freq: Every day | ORAL | 1 refills | Status: DC

## 2018-11-14 MED ORDER — ARIPIPRAZOLE 2 MG PO TABS: 2.0000 mg | ORAL_TABLET | Freq: Every day | ORAL | 1 refills | Status: DC

## 2018-11-14 MED ORDER — CLONAZEPAM 1 MG PO TABS: ORAL_TABLET | ORAL | 1 refills | Status: DC
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
## 2018-11-23 ENCOUNTER — Other Ambulatory Visit: Payer: Self-pay | Admitting: Vascular Surgery

## 2018-11-23 ENCOUNTER — Encounter: Payer: Self-pay | Admitting: Vascular Surgery

## 2018-12-06 ENCOUNTER — Other Ambulatory Visit (HOSPITAL_COMMUNITY)
Admission: RE | Admit: 2018-12-06 | Discharge: 2018-12-06 | Disposition: A | Discharge status: Home / Self Care | Payer: Medicare Other | Source: Ambulatory Visit | Attending: Vascular Surgery | Admitting: Vascular Surgery

## 2018-12-06 ENCOUNTER — Other Ambulatory Visit: Payer: Self-pay

## 2018-12-06 ENCOUNTER — Encounter (HOSPITAL_COMMUNITY): Payer: Self-pay | Admitting: *Deleted

## 2018-12-06 DIAGNOSIS — Z1159 Encounter for screening for other viral diseases: Secondary | ICD-10-CM | POA: Insufficient documentation

## 2018-12-06 LAB — SARS CORONAVIRUS 2 BY RT PCR (HOSPITAL ORDER, PERFORMED IN ~~LOC~~ HOSPITAL LAB): SARS Coronavirus 2: NEGATIVE

## 2018-12-06 NOTE — Progress Notes (Signed)
Ms Siegmann denies chest pain or shortness of breath. Patient denies that she nor her family has experienced any of the following: Cough Fever >100.4 Runny Nose Sore Throat Difficulty breathing/ shortness of breath Travel in past 14 days - no  Ms Crisp had a negative COVID test today, she will arrive 3 hours early on Thursday to be tested again since she wilo be going to dialysis.  Ms Pleitez has Type I diabetes.  Patient reports that most recent A1C was 9.1 Patient reports that CBG have been running higher since she started dialysis- up to 200. I instructed patient to check CBG after awaking and every 2 hours until arrival  to the hospital.  I Instructed patient if CBG is less than 70 to take 4 Glucose Tablets. Recheck CBG in 15 minutes then call pre- op desk at [phone] for further instructions. If scheduled to receive Insulin, do not take Insulin

## 2018-12-08 ENCOUNTER — Encounter (HOSPITAL_COMMUNITY): Payer: Self-pay

## 2018-12-08 ENCOUNTER — Ambulatory Visit (HOSPITAL_COMMUNITY): Payer: Medicare Other | Admitting: Anesthesiology

## 2018-12-08 ENCOUNTER — Encounter (HOSPITAL_COMMUNITY)
Admission: RE | Disposition: A | Discharge status: Home / Self Care | Payer: Self-pay | Source: Home / Self Care | Attending: Vascular Surgery

## 2018-12-08 ENCOUNTER — Other Ambulatory Visit: Payer: Self-pay

## 2018-12-08 ENCOUNTER — Ambulatory Visit (HOSPITAL_COMMUNITY)
Admission: RE | Admit: 2018-12-08 | Discharge: 2018-12-08 | Disposition: A | Discharge status: Home / Self Care | Payer: Medicare Other | Attending: Vascular Surgery | Admitting: Vascular Surgery

## 2018-12-08 DIAGNOSIS — E10319 Type 1 diabetes mellitus with unspecified diabetic retinopathy without macular edema: Secondary | ICD-10-CM | POA: Insufficient documentation

## 2018-12-08 DIAGNOSIS — E1042 Type 1 diabetes mellitus with diabetic polyneuropathy: Secondary | ICD-10-CM | POA: Insufficient documentation

## 2018-12-08 DIAGNOSIS — Z89431 Acquired absence of right foot: Secondary | ICD-10-CM | POA: Diagnosis not present

## 2018-12-08 DIAGNOSIS — E1022 Type 1 diabetes mellitus with diabetic chronic kidney disease: Secondary | ICD-10-CM | POA: Insufficient documentation

## 2018-12-08 DIAGNOSIS — L02412 Cutaneous abscess of left axilla: Secondary | ICD-10-CM | POA: Insufficient documentation

## 2018-12-08 DIAGNOSIS — Z8673 Personal history of transient ischemic attack (TIA), and cerebral infarction without residual deficits: Secondary | ICD-10-CM | POA: Diagnosis not present

## 2018-12-08 DIAGNOSIS — T82510A Breakdown (mechanical) of surgically created arteriovenous fistula, initial encounter: Secondary | ICD-10-CM | POA: Insufficient documentation

## 2018-12-08 DIAGNOSIS — Y832 Surgical operation with anastomosis, bypass or graft as the cause of abnormal reaction of the patient, or of later complication, without mention of misadventure at the time of the procedure: Secondary | ICD-10-CM | POA: Diagnosis not present

## 2018-12-08 DIAGNOSIS — Z5309 Procedure and treatment not carried out because of other contraindication: Secondary | ICD-10-CM | POA: Insufficient documentation

## 2018-12-08 DIAGNOSIS — Z89411 Acquired absence of right great toe: Secondary | ICD-10-CM | POA: Diagnosis not present

## 2018-12-08 DIAGNOSIS — N186 End stage renal disease: Secondary | ICD-10-CM | POA: Insufficient documentation

## 2018-12-08 DIAGNOSIS — Z79899 Other long term (current) drug therapy: Secondary | ICD-10-CM | POA: Diagnosis not present

## 2018-12-08 DIAGNOSIS — I120 Hypertensive chronic kidney disease with stage 5 chronic kidney disease or end stage renal disease: Secondary | ICD-10-CM | POA: Insufficient documentation

## 2018-12-08 DIAGNOSIS — E7800 Pure hypercholesterolemia, unspecified: Secondary | ICD-10-CM | POA: Diagnosis not present

## 2018-12-08 DIAGNOSIS — Z794 Long term (current) use of insulin: Secondary | ICD-10-CM | POA: Diagnosis not present

## 2018-12-08 DIAGNOSIS — Z992 Dependence on renal dialysis: Secondary | ICD-10-CM | POA: Diagnosis not present

## 2018-12-08 DIAGNOSIS — F329 Major depressive disorder, single episode, unspecified: Secondary | ICD-10-CM | POA: Insufficient documentation

## 2018-12-08 DIAGNOSIS — T82898A Other specified complication of vascular prosthetic devices, implants and grafts, initial encounter: Secondary | ICD-10-CM | POA: Diagnosis not present

## 2018-12-08 DIAGNOSIS — Z1159 Encounter for screening for other viral diseases: Secondary | ICD-10-CM | POA: Insufficient documentation

## 2018-12-08 DIAGNOSIS — E890 Postprocedural hypothyroidism: Secondary | ICD-10-CM | POA: Diagnosis not present

## 2018-12-08 HISTORY — DX: End stage renal disease: N18.6

## 2018-12-08 HISTORY — PX: INCISION AND DRAINAGE ABSCESS: SHX5864

## 2018-12-08 LAB — POCT I-STAT 4, (NA,K, GLUC, HGB,HCT)
Glucose, Bld: 97 mg/dL (ref 70–99)
HCT: 37 % (ref 36.0–46.0)
Hemoglobin: 12.6 g/dL (ref 12.0–15.0)
Potassium: 3.4 mmol/L — ABNORMAL LOW (ref 3.5–5.1)
Sodium: 138 mmol/L (ref 135–145)

## 2018-12-08 LAB — SARS CORONAVIRUS 2 BY RT PCR (HOSPITAL ORDER, PERFORMED IN ~~LOC~~ HOSPITAL LAB): SARS Coronavirus 2: NEGATIVE

## 2018-12-08 LAB — GLUCOSE, CAPILLARY
Glucose-Capillary: 111 mg/dL — ABNORMAL HIGH (ref 70–99)
Glucose-Capillary: 86 mg/dL (ref 70–99)

## 2018-12-08 SURGERY — CANCELLED PROCEDURE
Anesthesia: General | Site: Axilla | Laterality: Left

## 2018-12-08 MED ORDER — 0.9 % SODIUM CHLORIDE (POUR BTL) OPTIME
TOPICAL | Status: DC | PRN
  Administered 2018-12-08: 1000 mL

## 2018-12-08 MED ORDER — AMOXICILLIN-POT CLAVULANATE 875-125 MG PO TABS: 1.0000 | ORAL_TABLET | Freq: Two times a day (BID) | ORAL | 0 refills | Status: AC

## 2018-12-08 MED ORDER — LIDOCAINE 2% (20 MG/ML) 5 ML SYRINGE
INTRAMUSCULAR | Status: AC
  Filled 2018-12-08: qty 10

## 2018-12-08 MED ORDER — OXYCODONE-ACETAMINOPHEN 5-325 MG PO TABS: 1.0000 | ORAL_TABLET | Freq: Four times a day (QID) | ORAL | 0 refills | Status: DC | PRN

## 2018-12-08 MED ORDER — LIDOCAINE HCL (CARDIAC) PF 100 MG/5ML IV SOSY
PREFILLED_SYRINGE | INTRAVENOUS | Status: DC | PRN
  Administered 2018-12-08: 50 mg via INTRAVENOUS

## 2018-12-08 MED ORDER — FENTANYL CITRATE (PF) 250 MCG/5ML IJ SOLN
INTRAMUSCULAR | Status: DC | PRN
  Administered 2018-12-08: 50 ug via INTRAVENOUS

## 2018-12-08 MED ORDER — LIDOCAINE HCL (PF) 1 % IJ SOLN
INTRAMUSCULAR | Status: AC
  Filled 2018-12-08: qty 60

## 2018-12-08 MED ORDER — SODIUM CHLORIDE 0.9 % IV SOLN
INTRAVENOUS | Status: DC | PRN
  Administered 2018-12-08: 25 ug/min via INTRAVENOUS

## 2018-12-08 MED ORDER — FENTANYL CITRATE (PF) 250 MCG/5ML IJ SOLN
INTRAMUSCULAR | Status: AC
  Filled 2018-12-08: qty 5

## 2018-12-08 MED ORDER — PROPOFOL 10 MG/ML IV BOLUS
INTRAVENOUS | Status: DC | PRN
  Administered 2018-12-08: 110 mg via INTRAVENOUS

## 2018-12-08 MED ORDER — HEPARIN SODIUM (PORCINE) 1000 UNIT/ML IJ SOLN
INTRAMUSCULAR | Status: AC
  Filled 2018-12-08: qty 1

## 2018-12-08 MED ORDER — PROTAMINE SULFATE 10 MG/ML IV SOLN
INTRAVENOUS | Status: AC
  Filled 2018-12-08: qty 5

## 2018-12-08 MED ORDER — MIDAZOLAM HCL 5 MG/5ML IJ SOLN
INTRAMUSCULAR | Status: DC | PRN
  Administered 2018-12-08: 2 mg via INTRAVENOUS

## 2018-12-08 MED ORDER — ONDANSETRON HCL 4 MG/2ML IJ SOLN
INTRAMUSCULAR | Status: DC | PRN
  Administered 2018-12-08: 4 mg via INTRAVENOUS

## 2018-12-08 MED ORDER — MIDAZOLAM HCL 2 MG/2ML IJ SOLN
INTRAMUSCULAR | Status: AC
  Filled 2018-12-08: qty 2

## 2018-12-08 MED ORDER — PHENYLEPHRINE 40 MCG/ML (10ML) SYRINGE FOR IV PUSH (FOR BLOOD PRESSURE SUPPORT)
PREFILLED_SYRINGE | INTRAVENOUS | Status: AC
  Filled 2018-12-08: qty 10

## 2018-12-08 MED ORDER — SODIUM CHLORIDE 0.9 % IV SOLN
INTRAVENOUS | Status: DC
  Administered 2018-12-08 (×2): via INTRAVENOUS

## 2018-12-08 MED ORDER — CEFAZOLIN SODIUM-DEXTROSE 2-4 GM/100ML-% IV SOLN
2.0000 g | INTRAVENOUS | Status: AC
  Administered 2018-12-08: 10:00:00 2 g via INTRAVENOUS
  Filled 2018-12-08: qty 100

## 2018-12-08 MED ORDER — SODIUM CHLORIDE 0.9 % IV SOLN
INTRAVENOUS | Status: AC
  Filled 2018-12-08: qty 1.2

## 2018-12-08 MED ORDER — DEXAMETHASONE SODIUM PHOSPHATE 10 MG/ML IJ SOLN
INTRAMUSCULAR | Status: DC | PRN
  Administered 2018-12-08: 5 mg via INTRAVENOUS

## 2018-12-08 MED ORDER — DIPHENHYDRAMINE HCL 50 MG/ML IJ SOLN
INTRAMUSCULAR | Status: AC
  Filled 2018-12-08: qty 1

## 2018-12-08 SURGICAL SUPPLY — 38 items
ARMBAND PINK RESTRICT EXTREMIT (MISCELLANEOUS) IMPLANT
CANISTER SUCT 3000ML PPV (MISCELLANEOUS) IMPLANT
CLIP VESOCCLUDE MED 6/CT (CLIP) IMPLANT
CLIP VESOCCLUDE SM WIDE 6/CT (CLIP) IMPLANT
COVER PROBE W GEL 5X96 (DRAPES) IMPLANT
COVER WAND RF STERILE (DRAPES) IMPLANT
DECANTER SPIKE VIAL GLASS SM (MISCELLANEOUS) IMPLANT
DERMABOND ADVANCED (GAUZE/BANDAGES/DRESSINGS)
DERMABOND ADVANCED .7 DNX12 (GAUZE/BANDAGES/DRESSINGS) IMPLANT
ELECT REM PT RETURN 9FT ADLT (ELECTROSURGICAL) ×3
ELECTRODE REM PT RTRN 9FT ADLT (ELECTROSURGICAL) ×2 IMPLANT
GAUZE PACKING IODOFORM 1/2 (PACKING) ×3 IMPLANT
GAUZE SPONGE 4X4 12PLY STRL LF (GAUZE/BANDAGES/DRESSINGS) ×3 IMPLANT
GLOVE BIO SURGEON STRL SZ7.5 (GLOVE) IMPLANT
GLOVE BIOGEL PI IND STRL 8 (GLOVE) IMPLANT
GLOVE BIOGEL PI INDICATOR 8 (GLOVE)
GOWN STRL REUS W/ TWL LRG LVL3 (GOWN DISPOSABLE) ×2 IMPLANT
GOWN STRL REUS W/ TWL XL LVL3 (GOWN DISPOSABLE) IMPLANT
GOWN STRL REUS W/TWL LRG LVL3 (GOWN DISPOSABLE) ×1
GOWN STRL REUS W/TWL XL LVL3 (GOWN DISPOSABLE)
HEMOSTAT SPONGE AVITENE ULTRA (HEMOSTASIS) IMPLANT
KIT BASIN OR (CUSTOM PROCEDURE TRAY) IMPLANT
KIT TURNOVER KIT B (KITS) IMPLANT
NS IRRIG 1000ML POUR BTL (IV SOLUTION) ×3 IMPLANT
PACK CV ACCESS (CUSTOM PROCEDURE TRAY) IMPLANT
PAD ARMBOARD 7.5X6 YLW CONV (MISCELLANEOUS) ×6 IMPLANT
SUT GORETEX 6.0 TT13 (SUTURE) IMPLANT
SUT MNCRL AB 4-0 PS2 18 (SUTURE) IMPLANT
SUT PROLENE 6 0 BV (SUTURE) IMPLANT
SUT PROLENE 7 0 BV 1 (SUTURE) IMPLANT
SUT SILK 2 0 PERMA HAND 18 BK (SUTURE) IMPLANT
SUT VIC AB 3-0 SH 27 (SUTURE)
SUT VIC AB 3-0 SH 27X BRD (SUTURE) IMPLANT
SWAB CULTURE LIQ STUART DBL (MISCELLANEOUS) ×3 IMPLANT
SWAB CULTURE LIQUID MINI MALE (MISCELLANEOUS) ×3 IMPLANT
TOWEL GREEN STERILE (TOWEL DISPOSABLE) IMPLANT
UNDERPAD 30X30 (UNDERPADS AND DIAPERS) IMPLANT
WATER STERILE IRR 1000ML POUR (IV SOLUTION) IMPLANT

## 2018-12-08 NOTE — Progress Notes (Signed)
58 year old female scheduled for left upper arm AV graft placement due to failure to mature brachiobasilic fistula.  I asked the patient in preoperative holding prior to the procedure if she had any recent infections given plan for graft placement which she said no.  After the patient was under LMA anesthesia and her left arm was placed on an armboard it was noted that she had an apparent abscess under her left armpit.  Upon closer inspection there was a large boil here at the exact site that we would make our incision for the venous exposure and also where we would be tunneling or graft.  Ultimately I discussed with patient's father on the phone plan to abort AV graft placement given abscess at this location.  I&D was then performed with cultures taken with significant purulent drainage.  She will be sent home on antibiotics.  We will schedule wound check in office and then reschedule placement in the future.  Marty Heck, MD [facility] [place] Office: [phone] Pager: [phone]

## 2018-12-08 NOTE — Anesthesia Procedure Notes (Signed)
Procedure Name: LMA Insertion Date/Time: 12/08/2018 9:55 AM Performed by: Mariea Clonts, CRNA Pre-anesthesia Checklist: Patient identified, Emergency Drugs available, Suction available and Patient being monitored Patient Re-evaluated:Patient Re-evaluated prior to induction Oxygen Delivery Method: Circle System Utilized Preoxygenation: Pre-oxygenation with 100% oxygen Induction Type: IV induction Ventilation: Mask ventilation without difficulty LMA: LMA inserted LMA Size: 4.0 Number of attempts: 1 Airway Equipment and Method: Bite block Placement Confirmation: positive ETCO2 Tube secured with: Tape Dental Injury: Teeth and Oropharynx as per pre-operative assessment

## 2018-12-08 NOTE — H&P (Signed)
History and Physical Interval Note:  12/08/2018 9:21 AM  Suzanne Allen  has presented today for surgery, with the diagnosis of MECHANICAL BREAKDOWN OF AN ARTERIOVENOUS FISTULA.  The various methods of treatment have been discussed with the patient and family. After consideration of risks, benefits and other options for treatment, the patient has consented to  Procedure(s): INSERTION OF ARTERIOVENOUS (AV) GORE-TEX GRAFT ARM (Left) as a surgical intervention.  The patient's history has been reviewed, patient examined, no change in status, stable for surgery.  I have reviewed the patient's chart and labs.  Questions were answered to the patient's satisfaction.     Multiple attempts to salvage left brachiobasilic.  Still issues.  Plan for left arm AVG.  Marty Heck  REASON FOR VISIT:Evaluateleft arm brachiobasilic fistula  HPI: Suzanne A Simsis a 58 y.o.femalepresents for evaluation of her left brachiobasilic fistula.She underwentleft firststage basilic vein transposition on 12/31/2017 by Dr. Bridgett Larsson. I subsequently performed the second stage transposition on 02/21/2018. She was evaluated earlier this year and had a large arm hematoma from infiltration of the fistula we subsequently performed a fistulogram where she had a arterial anastomosis stenosis as well as an upper arm stenosis in the vein that was all angioplastied. We did subsequently put in a tunneled catheter and she hadbeen resting the fistula in the interim.  Patient states they started using the fistula after the hematoma resolved about a month ago. She states they are only able to use itfor about an hourbeforeit has issues with lowflow volume. Most the time they also have some difficulty sticking the fistula. Catheter in her right IJ has been working well with no issues.      Past Medical History:  Diagnosis Date  . 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 Anemia   . Anxiety   . CAP  (community acquired pneumonia) 03/13/2015  . Carpal tunnel syndrome 11/28/2014   Bilateral  . Chronic kidney disease    ESRD  . Chronic pancreatitis ([place])   . Depression   . Diabetes mellitus type I ([place]) dx'd 1977  . Diabetic osteomyelitis ([place])    right great toe/progress notes 09/23/2012  . Diabetic retinopathy ([place])   . Fatty liver 04/04/2014  . HTN (hypertension)   . Hypercholesteremia   . Hypothyroidism   . Macular degeneration, bilateral   . Migraines 1980's   "when I was in my 20's" (03/14/2015)  . Peripheral neuropathy   . Pneumonia 1993  . PTSD (post-traumatic stress disorder)   . Stroke [facility]) ? date & 09/22/2012  . TIA (transient ischemic attack) 09/22/2012   [name] 10/25/2012         Past Surgical History:  Procedure Laterality Date  . A/V FISTULAGRAM N/A 08/31/2018   Procedure: A/V FISTULAGRAM - Left arm; Surgeon: Marty Heck, MD; Location: [place] [facility]; Service: Cardiovascular; Laterality: N/A;  . AMPUTATION Right 07/12/2015   Procedure: Right Transmetatarsal amputation; Surgeon: Newt Minion, MD; Location: [place]; Service: Orthopedics; Laterality: Right;  . BASCILIC VEIN TRANSPOSITION Left 12/31/2017   Procedure: FIRST STAGE BASILIC VEIN TRANSPOSITION LEFT ARM; Surgeon: Conrad Pioneer, MD; Location: [place]; Service: Vascular; Laterality: Left;  . BASCILIC VEIN TRANSPOSITION Left 02/21/2018   Procedure: SECOND STAGE BASILIC VEIN TRANSPOSITION, LEFT UPPER ARM; Surgeon: Marty Heck, MD; Location: [place]; Service: Vascular; Laterality: Left;  . CATARACT EXTRACTION, BILATERAL    . [place]; 1995  . EYE SURGERY Bilateral    "hemorrhaged behind my eye"  .  I&D EXTREMITY Right multiple   "big toe"  . PERIPHERAL VASCULAR INTERVENTION Left 08/31/2018   Procedure: PERIPHERAL VASCULAR INTERVENTION; Surgeon: Marty Heck, MD; Location: [place] [facility]; Service: Cardiovascular;  Laterality: Left;  . PERIPHERALLY INSERTED CENTRAL CATHETER INSERTION Right 08/31/2012  . PICC LINE REMOVAL ([place] HX) Right 10/12/2012   [name] 10/12/2012  . REFRACTIVE SURGERY Bilateral 2013  . TOE AMPUTATION Right 11/2012   "big toe"  . TONSILLECTOMY  1965  . TOTAL THYROIDECTOMY  2012  . TUBAL LIGATION  1995         Family History  Problem Relation Age of Onset  . Depression Mother   . Diabetes Mother   . Alcohol abuse Father   . Alcohol abuse Sister   . Diabetes Maternal Grandmother     SOCIAL HISTORY: Social History        Tobacco Use  . Smoking status: Never Smoker  . Smokeless tobacco: Never Used  Substance Use Topics  . Alcohol use: No    Alcohol/week: 0.0 standard drinks         Allergies  Allergen Reactions  . Erythromycin Anaphylaxis  . Norvasc [Amlodipine] Shortness Of Breath and Swelling  . Vancomycin Shortness Of Breath and Other (See Comments)    [place] Syndrome Has patient had a PCN reaction causing immediate rash, facial/tongue/throat swelling, SOB or lightheadedness with hypotension: unk Has patient had a PCN reaction causing severe rash involving mucus membranes or skin necrosis: unk Has patient had a PCN reaction that required hospitalization: unk Has patient had a PCN reaction occurring within the last 10 years: unk If all of the above answers are "NO", then may proceed with Cephalosporin use.    . Nsaids Other (See Comments)    Avoid due to CKD  . Wellbutrin [Bupropion] Other (See Comments)          Current Outpatient Medications  Medication Sig Dispense Refill  . calcium carbonate (TUMS EX) 750 MG chewable tablet Chew 1 tablet by mouth daily as needed. Calcium Supplement     . carvedilol (COREG) 12.5 MG tablet Take 12.5 mg by mouth 2 (two) times daily with a meal.     . cholecalciferol (VITAMIN D) 1000 units tablet Take 1,000 Units by mouth daily.    . clonazePAM (KLONOPIN) 1 MG tablet Take  1 mg at bed time 30 tablet 2  . dorzolamide-timolol (COSOPT) 22.3-6.8 MG/ML ophthalmic solution Place 1 drop into both eyes 2 (two) times daily.     . DULoxetine (CYMBALTA) 60 MG capsule Take 1 capsule (60 mg total) by mouth daily. 30 capsule 2  . glucose blood (CONTOUR NEXT TEST) test strip one strip (1 each total) by Other route 4 (four) times daily. Use as directed. Pharmacy, please dispense this brand of blood glucose test strips: Bayer Contour Next    . hydrALAZINE (APRESOLINE) 25 MG tablet Take 25 mg by mouth 3 (three) times daily.   5  . insulin glargine (LANTUS) 100 UNIT/ML injection Inject 40 Units into the skin 2 (two) times daily.    . insulin lispro (HUMALOG) 100 UNIT/ML injection Inject 20 Units into the skin See admin instructions. Inject up to 25 units subcutaneously three times daily before meals if needed Per sliding scale as directed    . levothyroxine (SYNTHROID, LEVOTHROID) 150 MCG tablet Take 150 mcg by mouth daily before breakfast.    . lisinopril (PRINIVIL,ZESTRIL) 20 MG tablet Take 20 mg by mouth daily.   5  . multivitamin (RENA-VIT)  TABS tablet Take 1 tablet by mouth daily.    . RESTASIS 0.05 % ophthalmic emulsion Place 1 drop into both eyes 2 (two) times daily.  11  . rosuvastatin (CRESTOR) 40 MG tablet Take 40 mg by mouth daily.    . vitamin C (ASCORBIC ACID) 500 MG tablet Take 500 mg by mouth daily.     No current facility-administered medications for this visit.    REVIEW OF SYSTEMS: [X]  denotes positive finding, [ ]  denotes negative finding Cardiac  Comments:  Chest pain or chest pressure:    Shortness of breath upon exertion:    Short of breath when lying flat:    Irregular heart rhythm:        Vascular    Pain in calf, thigh, or hip brought on by ambulation:    Pain in feet at night that wakes you up from your sleep:     Blood clot in your veins:    Leg swelling:         Pulmonary    Oxygen at home:     Productive cough:     Wheezing:         Neurologic    Sudden weakness in arms or legs:     Sudden numbness in arms or legs:     Sudden onset of difficulty speaking or slurred speech:    Temporary loss of vision in one eye:     Problems with dizziness:         Gastrointestinal    Blood in stool:     Vomited blood:         Genitourinary    Burning when urinating:     Blood in urine:        Psychiatric    Major depression:         Hematologic    Bleeding problems:    Problems with blood clotting too easily:        Skin    Rashes or ulcers:        Constitutional    Fever or chills:      PHYSICAL EXAM: There were no vitals filed for this visit.  GENERAL:The patient is a well-nourished female, in no acute distress. The vital signs are documented above. CARDIAC:There is a regular rate and rhythm.  VASCULAR: Left brachiobasilic fistula with thrill near antecubitum and pulse in upper arm. Left radial pulse palpable PULMONARY:There is good air exchange bilaterally without wheezing or rales. ABDOMEN:Soft and non-tender with normal pitched bowel sounds.  MUSCULOSKELETAL:There are no major deformities or cyanosis. NEUROLOGIC:No focal weakness or paresthesias are detected.   DATA:   Reviewed her fistula duplexthat shows shehas an anastomotic stenosis as well as a second stenosis in her upper arm fistula that was previously noted on fistulogram.Also some evidence of partially occlusive thrombus near AVF anastomosis.  Assessment/Plan:  I had a long discussionwith Ms. Simsregarding the best steps moving forward. As previously noted on her last fistulogram she has a stenosis at the arterial anastomosis as well as a second stenosis in the upper arm basilic vein. We angioplastied both of these back in February and she isstill having issues with flow volumes. I discussed that I could be  more aggressive and potentially stent the venous stenosis in the upper arm but I amlimited to just angioplasty of the arterial anastomosisstenosisor surgical revision. She does have a thrill proximally but a pulse in her upper arm. I recommended atleast onemore attempt at fistulogram/intervention this Friday. Discussed if  this failsand/or the fistula still has issues with flow volumewe can convert her to an upper arm graft. She has no other usable superficial vein in the left arm and wants to try and save her right arm (only a usable basilic vein in right arm which I discussed would be an option as well).I understand Dr. Deterdingistrying toget her catheter outassoon as possibleanwe will try and help him with that.  All the hematoma has resolved since my last evaluation of her fistula.  She will dialyze Sat after intervention and discussed if this does not work after intervention, let our office know and will arrange left upper arm graft.   Marty Heck, MD [facility] [place] Office: [phone] Pager: [place]

## 2018-12-08 NOTE — Transfer of Care (Signed)
Immediate Anesthesia Transfer of Care Note  Patient: Suzanne Allen  Procedure(s) Performed: INSERTION OF ARTERIOVENOUS (AV) GORE-TEX GRAFT ARM (Left Arm Upper)  Patient Location: PACU  Anesthesia Type  Level of Consciousness: awake, alert  and oriented  Airway & Oxygen Therapy: Patient Spontanous Breathing and Patient connected to nasal cannula oxygen  Post-op Assessment: Report given to RN, Post -op Vital signs reviewed and stable and Patient moving all extremities X 4  Post vital signs: Reviewed and stable  Last Vitals:  Vitals Value Taken Time  BP 139/72 12/08/2018 10:21 AM  Temp    Pulse 71 12/08/2018 10:22 AM  Resp 14 12/08/2018 10:22 AM  SpO2 100 % 12/08/2018 10:22 AM  Vitals shown include unvalidated device data.  Last Pain:  Vitals:   12/08/18 0752  TempSrc:   PainSc: 0-No pain      Patients Stated Pain Goal: 0 (17/51/02 5852)  Complications: No apparent anesthesia complications

## 2018-12-08 NOTE — Progress Notes (Signed)
Dr. Ermalene Postin notified that patient did not take coreg 12.5mg  this more PTA and states she cannot take it on an empty stomach because it makes her sick.  No new orders received.  Will continue to monitor patient.

## 2018-12-08 NOTE — Op Note (Signed)
Date: Dec 08, 2018  Preoperative diagnosis: Planned left upper extremity AV graft placement for end-stage renal disease  Postoperative diagnosis: Left axillary abscess  Procedure: Incision and drainage of left axillary abscess  Surgeon: Dr. Marty Heck, MD  Assistant: OR staff  Indications: Patient is a 58 year old female who was scheduled for left upper extremity AV graft placement.  In preoperative holding she confirmed no recent infections.  Ultimately she was taken to the OR and after under LMA anesthesia her left arm was placed on an armboard it was noted that she had an abscess under her armpit.  This abscess extended onto her proximal arm underneath axilla where we will be making our incision for graft placement.  Ultimately graft placement was aborted and the abscess was drained.  Findings: Approximate 2 to 3 cm incision was made over the left axillary abscess in the upper arm with drainage of purulent fluid that was sent for culture and Gram stain.  Wound was irrigated.  It was packed with iodoform dressing.  Anesthesia: LMA  Details: The patient was taken to the operating room after informed consent was obtained.  She was placed on the operative table in the supine position and anesthesia was induced with LMA.  Her left arm was extended onto an arm board in preparation for prepping and draping.  I was ultimately called to the OR after nursing noted that she had a abscess under her axilla.  Upon closer inspection there was a large area of fluctuance under her arm with surrounding cellulitis.  Ultimately an 11 blade scalpel was used to perform approximate 3 cm incision here with immediate release of large purulent drainage.  Cultures were then sent.  I then used a hemostat and probed the wound and it extended fairly deep into the soft tissue of the upper arm down to where we would be making our exposure for the venous anastomosis.  Ultimately elected to abort graft placement given  risk for graft infection.  Iodoform dressing was then packed in the wound and dry dressing was applied after the wound had been irrigated.  She was taken to PACU in stable condition.  Complications: Aborted left upper arm AV graft placement and drained axillary abscess instead  Condition: Stable  Marty Heck, MD [facility] Office: [phone] Pager: [phone]

## 2018-12-08 NOTE — Discharge Instructions (Signed)
Left axillary(under arm) pack with iodoform ( brown bottle) guaze with q-tip and cover with 4 x4 guaze daily.

## 2018-12-08 NOTE — Anesthesia Preprocedure Evaluation (Signed)
Anesthesia Evaluation  Patient identified by MRN, date of birth, ID band Patient awake    Reviewed: Allergy & Precautions, H&P , NPO status , Patient's Chart, lab work & pertinent test results  Airway Mallampati: II   Neck ROM: full    Dental   Pulmonary shortness of breath,    breath sounds clear to auscultation       Cardiovascular hypertension,  Rhythm:regular Rate:Normal     Neuro/Psych  Headaches, PSYCHIATRIC DISORDERS Anxiety Depression TIA Neuromuscular disease CVA    GI/Hepatic   Endo/Other  diabetes, Insulin DependentHypothyroidism   Renal/GU ESRF and DialysisRenal disease     Musculoskeletal  (+) Arthritis ,   Abdominal   Peds  Hematology   Anesthesia Other Findings   Reproductive/Obstetrics                             Anesthesia Physical Anesthesia Plan  ASA: III  Anesthesia Plan: General   Post-op Pain Management:    Induction: Intravenous  PONV Risk Score and Plan: 3 and Ondansetron, Dexamethasone and Treatment may vary due to age or medical condition  Airway Management Planned: LMA  Additional Equipment:   Intra-op Plan:   Post-operative Plan:   Informed Consent: I have reviewed the patients History and Physical, chart, labs and discussed the procedure including the risks, benefits and alternatives for the proposed anesthesia with the patient or authorized representative who has indicated his/her understanding and acceptance.       Plan Discussed with: CRNA, Anesthesiologist and Surgeon  Anesthesia Plan Comments:         Anesthesia Quick Evaluation

## 2018-12-09 ENCOUNTER — Encounter (HOSPITAL_COMMUNITY): Payer: Self-pay | Admitting: Vascular Surgery

## 2018-12-13 ENCOUNTER — Ambulatory Visit (INDEPENDENT_AMBULATORY_CARE_PROVIDER_SITE_OTHER): Payer: Medicaid Other | Admitting: Psychiatry

## 2018-12-13 ENCOUNTER — Other Ambulatory Visit: Payer: Self-pay

## 2018-12-13 ENCOUNTER — Encounter (HOSPITAL_COMMUNITY): Payer: Self-pay | Admitting: Psychiatry

## 2018-12-13 DIAGNOSIS — F411 Generalized anxiety disorder: Secondary | ICD-10-CM

## 2018-12-13 DIAGNOSIS — F330 Major depressive disorder, recurrent, mild: Secondary | ICD-10-CM | POA: Diagnosis not present

## 2018-12-13 LAB — AEROBIC/ANAEROBIC CULTURE W GRAM STAIN (SURGICAL/DEEP WOUND)

## 2018-12-13 MED ORDER — DULOXETINE HCL 60 MG PO CPEP: 60.0000 mg | ORAL_CAPSULE | Freq: Every day | ORAL | 1 refills | Status: DC

## 2018-12-13 MED ORDER — CLONAZEPAM 1 MG PO TABS: ORAL_TABLET | ORAL | 1 refills | Status: DC
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
## 2018-12-13 NOTE — Anesthesia Postprocedure Evaluation (Signed)
Anesthesia Post Note  Patient: Suzanne Allen  Procedure(s) Performed: Cancelled Procedure (Left Arm Upper) Incision And Drainage of Left Axillary Abscess (Left Axilla)     Patient location during evaluation: PACU Anesthesia Type: General Level of consciousness: awake and alert Pain management: pain level controlled Vital Signs Assessment: post-procedure vital signs reviewed and stable Respiratory status: spontaneous breathing, nonlabored ventilation, respiratory function stable and patient connected to nasal cannula oxygen Cardiovascular status: blood pressure returned to baseline and stable Postop Assessment: no apparent nausea or vomiting Anesthetic complications: no    Last Vitals:  Vitals:   12/08/18 1051 12/08/18 1102  BP: 131/69 129/75  Pulse: 74 71  Resp: 18 15  Temp: (!) 36.4 C   SpO2: 96% 96%    Last Pain:  Vitals:   12/08/18 1051  TempSrc:   PainSc: 0-No pain                 Tanis Hensarling

## 2018-12-20 ENCOUNTER — Encounter: Payer: Self-pay | Admitting: Vascular Surgery

## 2018-12-20 ENCOUNTER — Ambulatory Visit (INDEPENDENT_AMBULATORY_CARE_PROVIDER_SITE_OTHER): Payer: Self-pay | Admitting: Vascular Surgery

## 2018-12-20 ENCOUNTER — Other Ambulatory Visit: Payer: Self-pay

## 2018-12-20 VITALS — BP 131/75 | HR 77 | Temp 98.3°F | Ht 63.0 in | Wt 159.9 lb

## 2018-12-20 DIAGNOSIS — Z992 Dependence on renal dialysis: Secondary | ICD-10-CM

## 2018-12-20 DIAGNOSIS — N186 End stage renal disease: Secondary | ICD-10-CM

## 2018-12-20 NOTE — Progress Notes (Signed)
Patient name: Suzanne Allen MRN: [id number] DOB: 1961/06/15 Sex: female  REASON FOR VISIT: Postop check after I&D of left axillary abscess  HPI: Suzanne Allen is a 58 y.o. female with multiple medical problems including end-stage renal disease that presents for follow-up after planned left upper arm graft placement that was aborted when we found an abscess that had to be drained in the OR.  On follow-up today she has had no additional fevers or chills.  Her dad is continuing to pack the axillary wound.  She completed her antibiotics.  Her basilic vein fistula in her left arm still has a great thrill and she states they have not accessed it.  She is currently using a right IJ tunneled catheter.  Past Medical History:  Diagnosis Date  . 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 Anemia   . Anxiety   . CAP (community acquired pneumonia) 03/13/2015  . Carpal tunnel syndrome 11/28/2014   Bilateral  . Chronic pancreatitis ([place])   . Depression   . Diabetes mellitus type I ([place]) dx'd 1977  . Diabetic osteomyelitis ([place])    right great toe/progress notes 09/23/2012  . Diabetic retinopathy ([place])   . ESRD (end stage renal disease) ([place])    ESRD- Hemo MWF- [facility]  . Fatty liver 04/04/2014  . HTN (hypertension)   . Hypercholesteremia   . Hypothyroidism   . Macular degeneration, bilateral   . Migraines 1980's   "when I was in my 20's" (03/14/2015)  . Peripheral neuropathy   . Pneumonia 1993  . PTSD (post-traumatic stress disorder)   . Stroke [facility]) ? date & 09/22/2012   TIA's - no resuidual  . TIA (transient ischemic attack) 09/22/2012   [name] 10/25/2012    Past Surgical History:  Procedure Laterality Date  . A/V FISTULAGRAM N/A 08/31/2018   Procedure: A/V FISTULAGRAM - Left arm;  Surgeon: Marty Heck, MD;  Location: [facility];  Service: Cardiovascular;  Laterality: N/A;  . A/V FISTULAGRAM N/A 11/04/2018   Procedure: A/V FISTULAGRAM - Left Arm;  Surgeon:  Marty Heck, MD;  Location: [facility];  Service: Cardiovascular;  Laterality: N/A;  . AMPUTATION Right 07/12/2015   Procedure: Right Transmetatarsal amputation;  Surgeon: Newt Minion, MD;  Location: [facility];  Service: Orthopedics;  Laterality: Right;  . BASCILIC VEIN TRANSPOSITION Left 12/31/2017   Procedure: FIRST STAGE BASILIC VEIN TRANSPOSITION LEFT ARM;  Surgeon: Conrad [place], MD;  Location: [facility];  Service: Vascular;  Laterality: Left;  . BASCILIC VEIN TRANSPOSITION Left 02/21/2018   Procedure: SECOND STAGE BASILIC VEIN TRANSPOSITION, LEFT UPPER ARM;  Surgeon: Marty Heck, MD;  Location: [place];  Service: Vascular;  Laterality: Left;  . CATARACT EXTRACTION, BILATERAL    . [place]; 1995  . EYE SURGERY Bilateral    "hemorrhaged behind my eye"  . I&D EXTREMITY Right multiple   "big toe"  . INCISION AND DRAINAGE ABSCESS Left 12/08/2018   Procedure: Incision And Drainage of Left Axillary Abscess;  Surgeon: Marty Heck, MD;  Location: [facility];  Service: Vascular;  Laterality: Left;  . PERIPHERAL VASCULAR INTERVENTION Left 08/31/2018   Procedure: PERIPHERAL VASCULAR INTERVENTION;  Surgeon: Marty Heck, MD;  Location: [facility];  Service: Cardiovascular;  Laterality: Left;  . PERIPHERAL VASCULAR INTERVENTION Left 11/04/2018   Procedure: PERIPHERAL VASCULAR INTERVENTION;  Surgeon: Marty Heck, MD;  Location: [facility];  Service: Cardiovascular;  Laterality:  Left;  arm fistula  . PERIPHERALLY INSERTED CENTRAL CATHETER INSERTION Right 08/31/2012  . PICC LINE REMOVAL ([place] HX) Right 10/12/2012   [name] 10/12/2012  . REFRACTIVE SURGERY Bilateral 2013  . TOE AMPUTATION Right 11/2012   "big toe"  . TONSILLECTOMY  1965  . TOTAL THYROIDECTOMY  2012  . TUBAL LIGATION  1995    Family History  Problem Relation Age of Onset  . Depression Mother   . Diabetes Mother   . Alcohol abuse Father   . Alcohol abuse Sister   .  Diabetes Maternal Grandmother     SOCIAL HISTORY: Social History   Tobacco Use  . Smoking status: Never Smoker  . Smokeless tobacco: Never Used  Substance Use Topics  . Alcohol use: No    Alcohol/week: 0.0 standard drinks    Allergies  Allergen Reactions  . Erythromycin Anaphylaxis  . Norvasc [Amlodipine] Shortness Of Breath and Swelling  . Vancomycin Shortness Of Breath and Other (See Comments)    Red Man Syndrome Has patient had a PCN reaction causing immediate rash, facial/tongue/throat swelling, SOB or lightheadedness with hypotension: unk Has patient had a PCN reaction causing severe rash involving mucus membranes or skin necrosis: unk Has patient had a PCN reaction that required hospitalization: unk Has patient had a PCN reaction occurring within the last 10 years: unk If all of the above answers are "NO", then may proceed with Cephalosporin use.    . Nsaids Other (See Comments)    Avoid due to CKD  . Wellbutrin [Bupropion] Other (See Comments)    Extreme neurological effects     Current Outpatient Medications  Medication Sig Dispense Refill  . amoxicillin-clavulanate (AUGMENTIN) 875-125 MG tablet Take 1 tablet by mouth 2 (two) times daily for 14 days. 28 tablet 0  . Ascorbic Acid (VITAMIN C GUMMIE PO) Take 2 tablets by mouth daily.    . carvedilol (COREG) 12.5 MG tablet Take 12.5 mg by mouth 2 (two) times daily with a meal.     . Cholecalciferol (VITAMIN D-3) 125 MCG (5000 UT) TABS Take 10,000 Units by mouth daily.    . clonazePAM (KLONOPIN) 1 MG tablet Take one tab at night before dialysis day and 2nd as needed for severe anxiety. 60 tablet 1  . dorzolamide-timolol (COSOPT) 22.3-6.8 MG/ML ophthalmic solution Place 1 drop into both eyes 2 (two) times daily.     . DULoxetine (CYMBALTA) 60 MG capsule Take 1 capsule (60 mg total) by mouth daily. 30 capsule 1  . glucose blood (CONTOUR NEXT TEST) test strip one strip (1 each total) by Other route 4 (four) times daily. Use  as directed. Pharmacy, please dispense this brand of blood glucose test strips: Bayer Contour Next    . insulin glargine (LANTUS) 100 UNIT/ML injection Inject 40 Units into the skin 2 (two) times daily.    . insulin lispro (HUMALOG) 100 UNIT/ML injection Inject 20 Units into the skin See admin instructions. Inject up to 20 units subcutaneously three times daily before meals if needed Per sliding scale as directed    . levothyroxine (SYNTHROID, LEVOTHROID) 150 MCG tablet Take 150 mcg by mouth daily before breakfast.    . lidocaine-prilocaine (EMLA) cream APPLY SMALL AMOUNT TO ACCESS SITE (AVF) 3 TIMES A WEEK 1 HOUR BEFORE DIALYSIS. COVER WITH OCCLUSIVE DRESSING (SARAN WRAP)    . lisinopril (PRINIVIL,ZESTRIL) 20 MG tablet Take 20 mg by mouth at bedtime.   5  . multivitamin (RENA-VIT) TABS tablet Take 1 tablet by mouth at bedtime.     Marland Kitchen  oxyCODONE-acetaminophen (PERCOCET/ROXICET) 5-325 MG tablet Take 1 tablet by mouth every 6 (six) hours as needed. 6 tablet 0  . RENVELA 2.4 g PACK VIGOROUSLY MIX CONTENTS OF 1 PACKET IN WATER (IT WILL NOT DISSOLVE) AND DRINK 3 TIMES DAILY WITH MEALS AND TWICE DAILY WITH SNACKS    . RESTASIS 0.05 % ophthalmic emulsion Place 1 drop into both eyes 2 (two) times daily as needed (for dry/irritated eyes).   11  . rosuvastatin (CRESTOR) 40 MG tablet Take 40 mg by mouth daily.     No current facility-administered medications for this visit.     REVIEW OF SYSTEMS:  [X]  denotes positive finding, [ ]  denotes negative finding Cardiac  Comments:  Chest pain or chest pressure:    Shortness of breath upon exertion:    Short of breath when lying flat:    Irregular heart rhythm:        Vascular    Pain in calf, thigh, or hip brought on by ambulation:    Pain in feet at night that wakes you up from your sleep:     Blood clot in your veins:    Leg swelling:         Pulmonary    Oxygen at home:    Productive cough:     Wheezing:         Neurologic    Sudden weakness in  arms or legs:     Sudden numbness in arms or legs:     Sudden onset of difficulty speaking or slurred speech:    Temporary loss of vision in one eye:     Problems with dizziness:         Gastrointestinal    Blood in stool:     Vomited blood:         Genitourinary    Burning when urinating:     Blood in urine:        Psychiatric    Major depression:         Hematologic    Bleeding problems:    Problems with blood clotting too easily:        Skin    Rashes or ulcers:        Constitutional    Fever or chills:      PHYSICAL EXAM: Vitals:   12/20/18 0833  BP: 131/75  Pulse: 77  Temp: 98.3 F (36.8 C)  TempSrc: Temporal  SpO2: 100%  Weight: 159 lb 14.4 oz (72.5 kg)  Height: 5\' 3"  (1.6 m)    GENERAL: The patient is a well-nourished female, in no acute distress. The vital signs are documented above. CARDIAC: There is a regular rate and rhythm.  VASCULAR:  Left radial pulse 2+ palpable Left basilic vein fistula good thrill Left axillary incision clean - no induration or drainage or cellulitis  DATA:   None  Assessment/Plan:  58 year old female status post I&D of left axillary abscess on 12/08/2018 with cancellation of a planned left arm AV graft.  On follow-up today her axillary abscess incision is healing well, she has no induration or cellulitis.  She has completed her antibiotics.  Her dad continues to pack the wound that remains very shallow.  Discussed that I want this wound to completely heal before proceeding with left upper arm graft placement.  She states that she would like to have dialysis try and access her basilic vein fistula in the left arm again before proceeding with a graft.  She has an excellent thrill  in the fistula and this has previously been stented after multiple endovascular interventions.  I am comfortable with dialysis trying to access this again tomorrow at her next dialysis session.  Limitation in the past has been pain with access.  She will  contact our office if there are issues and if she decides to proceed with left arm graft placement.  Again reinforced that her wound has to completely heal before doing this.   Marty Heck, MD [facility] Office: [phone] Pager: [phone]

## 2019-02-14 ENCOUNTER — Encounter (HOSPITAL_COMMUNITY): Payer: Self-pay | Admitting: Psychiatry

## 2019-02-14 ENCOUNTER — Other Ambulatory Visit: Payer: Self-pay

## 2019-02-14 ENCOUNTER — Ambulatory Visit (INDEPENDENT_AMBULATORY_CARE_PROVIDER_SITE_OTHER): Payer: Medicaid Other | Admitting: Psychiatry

## 2019-02-14 DIAGNOSIS — F411 Generalized anxiety disorder: Secondary | ICD-10-CM

## 2019-02-14 DIAGNOSIS — F4310 Post-traumatic stress disorder, unspecified: Secondary | ICD-10-CM

## 2019-02-14 DIAGNOSIS — F330 Major depressive disorder, recurrent, mild: Secondary | ICD-10-CM

## 2019-02-14 MED ORDER — CLONAZEPAM 1 MG PO TABS: ORAL_TABLET | ORAL | 1 refills | Status: DC

## 2019-02-14 MED ORDER — DULOXETINE HCL 60 MG PO CPEP: 60.0000 mg | ORAL_CAPSULE | Freq: Every day | ORAL | 2 refills | Status: DC
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
## 2019-05-17 ENCOUNTER — Ambulatory Visit (HOSPITAL_COMMUNITY): Payer: Medicaid Other | Admitting: Psychiatry

## 2019-05-18 ENCOUNTER — Encounter (HOSPITAL_COMMUNITY): Payer: Self-pay | Admitting: Psychiatry

## 2019-05-18 ENCOUNTER — Ambulatory Visit (INDEPENDENT_AMBULATORY_CARE_PROVIDER_SITE_OTHER): Payer: Medicare Other | Admitting: Psychiatry

## 2019-05-18 ENCOUNTER — Other Ambulatory Visit: Payer: Self-pay

## 2019-05-18 DIAGNOSIS — F4310 Post-traumatic stress disorder, unspecified: Secondary | ICD-10-CM

## 2019-05-18 DIAGNOSIS — F330 Major depressive disorder, recurrent, mild: Secondary | ICD-10-CM

## 2019-05-18 DIAGNOSIS — F411 Generalized anxiety disorder: Secondary | ICD-10-CM

## 2019-05-18 MED ORDER — DULOXETINE HCL 60 MG PO CPEP: 60.0000 mg | ORAL_CAPSULE | Freq: Every day | ORAL | 2 refills | Status: DC

## 2019-05-18 MED ORDER — CLONAZEPAM 1 MG PO TABS: ORAL_TABLET | ORAL | 1 refills | Status: DC
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
## 2019-08-18 ENCOUNTER — Ambulatory Visit (INDEPENDENT_AMBULATORY_CARE_PROVIDER_SITE_OTHER): Payer: Medicare Other | Admitting: Psychiatry

## 2019-08-18 ENCOUNTER — Other Ambulatory Visit: Payer: Self-pay

## 2019-08-18 ENCOUNTER — Encounter (HOSPITAL_COMMUNITY): Payer: Self-pay | Admitting: Psychiatry

## 2019-08-18 DIAGNOSIS — F330 Major depressive disorder, recurrent, mild: Secondary | ICD-10-CM | POA: Diagnosis not present

## 2019-08-18 DIAGNOSIS — F411 Generalized anxiety disorder: Secondary | ICD-10-CM | POA: Diagnosis not present

## 2019-08-18 DIAGNOSIS — F4310 Post-traumatic stress disorder, unspecified: Secondary | ICD-10-CM

## 2019-08-18 MED ORDER — CLONAZEPAM 1 MG PO TABS: ORAL_TABLET | ORAL | 1 refills | Status: DC

## 2019-08-18 MED ORDER — DULOXETINE HCL 60 MG PO CPEP: 60.0000 mg | ORAL_CAPSULE | Freq: Every day | ORAL | 2 refills | Status: DC
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
## 2019-08-21 ENCOUNTER — Emergency Department (HOSPITAL_COMMUNITY)
Admission: EM | Admit: 2019-08-21 | Discharge: 2019-08-21 | Disposition: A | Discharge status: Home / Self Care | Payer: Medicare Other | Attending: Emergency Medicine | Admitting: Emergency Medicine

## 2019-08-21 ENCOUNTER — Emergency Department (HOSPITAL_COMMUNITY): Payer: Medicare Other

## 2019-08-21 ENCOUNTER — Other Ambulatory Visit: Payer: Self-pay

## 2019-08-21 DIAGNOSIS — Z992 Dependence on renal dialysis: Secondary | ICD-10-CM | POA: Diagnosis not present

## 2019-08-21 DIAGNOSIS — N186 End stage renal disease: Secondary | ICD-10-CM | POA: Diagnosis not present

## 2019-08-21 DIAGNOSIS — E039 Hypothyroidism, unspecified: Secondary | ICD-10-CM | POA: Insufficient documentation

## 2019-08-21 DIAGNOSIS — Z79899 Other long term (current) drug therapy: Secondary | ICD-10-CM | POA: Diagnosis not present

## 2019-08-21 DIAGNOSIS — Z794 Long term (current) use of insulin: Secondary | ICD-10-CM | POA: Insufficient documentation

## 2019-08-21 DIAGNOSIS — R519 Headache, unspecified: Secondary | ICD-10-CM | POA: Diagnosis not present

## 2019-08-21 DIAGNOSIS — I120 Hypertensive chronic kidney disease with stage 5 chronic kidney disease or end stage renal disease: Secondary | ICD-10-CM | POA: Insufficient documentation

## 2019-08-21 DIAGNOSIS — Z20822 Contact with and (suspected) exposure to covid-19: Secondary | ICD-10-CM | POA: Diagnosis not present

## 2019-08-21 DIAGNOSIS — I10 Essential (primary) hypertension: Secondary | ICD-10-CM

## 2019-08-21 DIAGNOSIS — E1122 Type 2 diabetes mellitus with diabetic chronic kidney disease: Secondary | ICD-10-CM | POA: Diagnosis not present

## 2019-08-21 DIAGNOSIS — H53149 Visual discomfort, unspecified: Secondary | ICD-10-CM | POA: Insufficient documentation

## 2019-08-21 LAB — CBC WITH DIFFERENTIAL/PLATELET
Abs Immature Granulocytes: 0.14 10*3/uL — ABNORMAL HIGH (ref 0.00–0.07)
Basophils Absolute: 0.1 10*3/uL (ref 0.0–0.1)
Basophils Relative: 1 %
Eosinophils Absolute: 0.4 10*3/uL (ref 0.0–0.5)
Eosinophils Relative: 3 %
HCT: 38.2 % (ref 36.0–46.0)
Hemoglobin: 11.9 g/dL — ABNORMAL LOW (ref 12.0–15.0)
Immature Granulocytes: 1 %
Lymphocytes Relative: 15 %
Lymphs Abs: 1.8 10*3/uL (ref 0.7–4.0)
MCH: 29 pg (ref 26.0–34.0)
MCHC: 31.2 g/dL (ref 30.0–36.0)
MCV: 92.9 fL (ref 80.0–100.0)
Monocytes Absolute: 0.8 10*3/uL (ref 0.1–1.0)
Monocytes Relative: 7 %
Neutro Abs: 8.9 10*3/uL — ABNORMAL HIGH (ref 1.7–7.7)
Neutrophils Relative %: 73 %
Platelets: 420 10*3/uL — ABNORMAL HIGH (ref 150–400)
RBC: 4.11 MIL/uL (ref 3.87–5.11)
RDW: 17.2 % — ABNORMAL HIGH (ref 11.5–15.5)
WBC: 12.1 10*3/uL — ABNORMAL HIGH (ref 4.0–10.5)
nRBC: 0 % (ref 0.0–0.2)

## 2019-08-21 LAB — COMPREHENSIVE METABOLIC PANEL
ALT: 16 U/L (ref 0–44)
AST: 21 U/L (ref 15–41)
Albumin: 3.3 g/dL — ABNORMAL LOW (ref 3.5–5.0)
Alkaline Phosphatase: 99 U/L (ref 38–126)
Anion gap: 13 (ref 5–15)
BUN: 17 mg/dL (ref 6–20)
CO2: 27 mmol/L (ref 22–32)
Calcium: 9.4 mg/dL (ref 8.9–10.3)
Chloride: 96 mmol/L — ABNORMAL LOW (ref 98–111)
Creatinine, Ser: 3.37 mg/dL — ABNORMAL HIGH (ref 0.44–1.00)
GFR calc Af Amer: 17 mL/min — ABNORMAL LOW (ref >60)
GFR calc non Af Amer: 14 mL/min — ABNORMAL LOW (ref >60)
Glucose, Bld: 78 mg/dL (ref 70–99)
Potassium: 3.2 mmol/L — ABNORMAL LOW (ref 3.5–5.1)
Sodium: 136 mmol/L (ref 135–145)
Total Bilirubin: 0.8 mg/dL (ref 0.3–1.2)
Total Protein: 7.9 g/dL (ref 6.5–8.1)

## 2019-08-21 LAB — POC SARS CORONAVIRUS 2 AG -  ED: SARS Coronavirus 2 Ag: NEGATIVE

## 2019-08-21 MED ORDER — DIPHENHYDRAMINE HCL 50 MG/ML IJ SOLN
25.0000 mg | Freq: Once | INTRAMUSCULAR | Status: AC
  Administered 2019-08-21: 25 mg via INTRAVENOUS
  Filled 2019-08-21: qty 1

## 2019-08-21 MED ORDER — CARVEDILOL 12.5 MG PO TABS
12.5000 mg | ORAL_TABLET | Freq: Once | ORAL | Status: AC
  Administered 2019-08-21: 12.5 mg via ORAL
  Filled 2019-08-21: qty 1

## 2019-08-21 MED ORDER — HYDRALAZINE HCL 20 MG/ML IJ SOLN
10.0000 mg | Freq: Once | INTRAMUSCULAR | Status: AC
  Administered 2019-08-21: 19:00:00 10 mg via INTRAVENOUS
  Filled 2019-08-21: qty 1

## 2019-08-21 MED ORDER — HYDROCODONE-ACETAMINOPHEN 5-325 MG PO TABS
1.0000 | ORAL_TABLET | Freq: Once | ORAL | Status: AC
  Administered 2019-08-21: 18:00:00 1 via ORAL
  Filled 2019-08-21 (×2): qty 1

## 2019-08-21 MED ORDER — LISINOPRIL 20 MG PO TABS
20.0000 mg | ORAL_TABLET | Freq: Once | ORAL | Status: AC
  Administered 2019-08-21: 18:00:00 20 mg via ORAL
  Filled 2019-08-21: qty 1

## 2019-08-21 MED ORDER — PROCHLORPERAZINE EDISYLATE 10 MG/2ML IJ SOLN
10.0000 mg | Freq: Once | INTRAMUSCULAR | Status: AC
  Administered 2019-08-21: 10 mg via INTRAVENOUS
  Filled 2019-08-21: qty 2

## 2019-08-21 NOTE — ED Triage Notes (Signed)
Pt here from dialysis, feeling bad (headache, photosensitivity, nausea) during third hour of dialysis. Hypertensive 222/120 with EMS. Pt sts she normally feels bad during the third hour but this is worse. N/V in route, 4 mg zofran given PTA.

## 2019-08-21 NOTE — ED Provider Notes (Signed)
[facility] EMERGENCY DEPARTMENT Provider Note   CSN: [id number] Arrival date & time: 08/21/19  1601     History No chief complaint on file.   Suzanne Allen is a 59 y.o. female.  Patient is a 59 year old female with past medical history of uncontrolled diabetes, CKD on dialysis Monday Wednesday Friday, hypertension, hypothyroidism presenting to the emergency department from dialysis for headache.  Patient reports that she usually gets a headache and feels overall bad in the third hour of her dialysis.  She reports today the headache is much more worse than normal.  Reports that the headache is unbearable and she is having photophobia and nausea.  Denies any other unusual symptoms leading up to dialysis.  She did complete her previous dialysis treatment on Friday.        Past Medical History:  Diagnosis Date  . 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 Anemia   . Anxiety   . CAP (community acquired pneumonia) 03/13/2015  . Carpal tunnel syndrome 11/28/2014   Bilateral  . Chronic pancreatitis ([place])   . Depression   . Diabetes mellitus type I ([place]) dx'd 1977  . Diabetic osteomyelitis ([place])    right great toe/progress notes 09/23/2012  . Diabetic retinopathy ([place])   . ESRD (end stage renal disease) ([place])    ESRD- Hemo MWF- [facility]  . Fatty liver 04/04/2014  . HTN (hypertension)   . Hypercholesteremia   . Hypothyroidism   . Macular degeneration, bilateral   . Migraines 1980's   "when I was in my 20's" (03/14/2015)  . Peripheral neuropathy   . Pneumonia 1993  . PTSD (post-traumatic stress disorder)   . Stroke [facility]) ? date & 09/22/2012   TIA's - no resuidual  . TIA (transient ischemic attack) 09/22/2012   [name] 10/25/2012    Patient Active Problem List   Diagnosis Date Noted  . ESRD on dialysis ([place]) 11/01/2018  . Ulcer of right heel, limited to breakdown of skin ([place]) 08/15/2018  . Amputation of toe of right foot ([place]) 08/10/2018  . CKD  (chronic kidney disease) stage 5, GFR less than 15 ml/min (HCC) 12/22/2017  . Chest pain 09/20/2017  . Diabetic nephropathy associated with type 1 diabetes mellitus ([place]) 03/03/2017  . Cellulitis of left foot 07/23/2016  . Status post transmetatarsal amputation of foot, right ([place]) 07/17/2016  . Major depressive disorder, recurrent episode, mild ([place]) 05/04/2016  . Osteomyelitis ([place]) 07/08/2015  . Poorly controlled diabetes mellitus ([place]) 07/08/2015  . Depression with anxiety 07/08/2015  . Lactic acid acidosis 07/08/2015  . BP (high blood pressure) 06/17/2015  . 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 Hypothyroidism, postop 06/17/2015  . 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 Proliferative diabetic retinopathy ([place]) 06/17/2015  . Dysesthesia 06/17/2015  . CAP (community acquired pneumonia) 03/13/2015  . Dyspnea 03/13/2015  . Respiratory failure with hypoxia ([place]) 03/13/2015  . Pericardial effusion 03/13/2015  . Depression 02/15/2015  . Carpal tunnel syndrome 11/28/2014  . Diabetes, polyneuropathy ([place]) 11/15/2014  . Numbness 11/15/2014  . Gait disturbance 11/15/2014  . Hand weakness 11/15/2014  . Insomnia 11/15/2014  . DDD (degenerative disc disease), lumbar 04/10/2014  . Diarrhea 04/05/2014  . Dyslipidemia 04/04/2014  . Fatty liver 04/04/2014  . Hypokalemia 04/04/2014  . Elevated LFTs 04/04/2014  . Benign essential HTN 04/02/2014  . Hypothyroidism 04/02/2014  . Diabetes ([place]) 10/10/2013  . Pancreatitis, acute 10/10/2013  . Leukocytosis 10/04/2013  . Acute pancreatitis 10/04/2013  . Diabetic neuropathy ([place]) 10/03/2013  .  TIA (transient ischemic attack) 09/23/2012  . Temporary cerebral vascular dysfunction 09/23/2012  . Diabetic osteomyelitis ([place]) 08/29/2012  . Diabetes type 1, uncontrolled ([place]) 08/29/2012  . Type 1 DM with CKD and hypertension ([place]) 08/29/2012  . Type 2 diabetes mellitus ([place]) 08/29/2012  . Diabetic foot ulcer ([place]) 07/27/2012   . Anxiety 08/24/2011  . Arthralgia of multiple joints 10/01/2010  . HLD (hyperlipidemia) 04/25/2010  . Avitaminosis D 04/25/2010  . Allergic rhinitis 08/16/2008    Past Surgical History:  Procedure Laterality Date  . A/V FISTULAGRAM N/A 08/31/2018   Procedure: A/V FISTULAGRAM - Left arm;  Surgeon: Marty Heck, MD;  Location: [facility];  Service: Cardiovascular;  Laterality: N/A;  . A/V FISTULAGRAM N/A 11/04/2018   Procedure: A/V FISTULAGRAM - Left Arm;  Surgeon: Marty Heck, MD;  Location: [facility];  Service: Cardiovascular;  Laterality: N/A;  . AMPUTATION Right 07/12/2015   Procedure: Right Transmetatarsal amputation;  Surgeon: Newt Minion, MD;  Location: [facility];  Service: Orthopedics;  Laterality: Right;  . BASCILIC VEIN TRANSPOSITION Left 12/31/2017   Procedure: FIRST STAGE BASILIC VEIN TRANSPOSITION LEFT ARM;  Surgeon: Conrad Baylis, MD;  Location: [facility];  Service: Vascular;  Laterality: Left;  . BASCILIC VEIN TRANSPOSITION Left 02/21/2018   Procedure: SECOND STAGE BASILIC VEIN TRANSPOSITION, LEFT UPPER ARM;  Surgeon: Marty Heck, MD;  Location: [facility];  Service: Vascular;  Laterality: Left;  . CATARACT EXTRACTION, BILATERAL    . [place]; 1995  . EYE SURGERY Bilateral    "hemorrhaged behind my eye"  . I & D EXTREMITY Right multiple   "big toe"  . INCISION AND DRAINAGE ABSCESS Left 12/08/2018   Procedure: Incision And Drainage of Left Axillary Abscess;  Surgeon: Marty Heck, MD;  Location: [facility];  Service: Vascular;  Laterality: Left;  . PERIPHERAL VASCULAR INTERVENTION Left 08/31/2018   Procedure: PERIPHERAL VASCULAR INTERVENTION;  Surgeon: Marty Heck, MD;  Location: [facility];  Service: Cardiovascular;  Laterality: Left;  . PERIPHERAL VASCULAR INTERVENTION Left 11/04/2018   Procedure: PERIPHERAL VASCULAR INTERVENTION;  Surgeon: Marty Heck, MD;  Location: [facility];  Service:  Cardiovascular;  Laterality: Left;  arm fistula  . PERIPHERALLY INSERTED CENTRAL CATHETER INSERTION Right 08/31/2012  . PICC LINE REMOVAL ([place] HX) Right 10/12/2012   [name] 10/12/2012  . REFRACTIVE SURGERY Bilateral 2013  . TOE AMPUTATION Right 11/2012   "big toe"  . TONSILLECTOMY  1965  . TOTAL THYROIDECTOMY  2012  . TUBAL LIGATION  1995     OB History   No obstetric history on file.     Family History  Problem Relation Age of Onset  . Depression Mother   . Diabetes Mother   . Alcohol abuse Father   . Alcohol abuse Sister   . Diabetes Maternal Grandmother     Social History   Tobacco Use  . Smoking status: Never Smoker  . Smokeless tobacco: Never Used  Substance Use Topics  . Alcohol use: No    Alcohol/week: 0.0 standard drinks  . Drug use: No    Home Medications Prior to Admission medications   Medication Sig Start Date End Date Taking? Authorizing Provider  Ascorbic Acid (VITAMIN C GUMMIE PO) Take 2 tablets by mouth daily.    [provider]  carvedilol (COREG) 12.5 MG tablet Take 12.5 mg by mouth 2 (two) times daily with a meal.     [provider]  Cholecalciferol (VITAMIN D-3) 125 MCG (5000 UT) TABS Take 10,000 Units by mouth daily.    [provider]  clonazePAM (KLONOPIN) 1 MG tablet Take one tab at night before dialysis day and 2nd as needed for severe anxiety. 08/18/19   Arfeen, Arlyce Harman, MD  dorzolamide-timolol (COSOPT) 22.3-6.8 MG/ML ophthalmic solution Place 1 drop into both eyes 2 (two) times daily.     [provider]  DULoxetine (CYMBALTA) 60 MG capsule Take 1 capsule (60 mg total) by mouth daily. 08/18/19   Arfeen, Arlyce Harman, MD  glucose blood (CONTOUR NEXT TEST) test strip one strip (1 each total) by Other route 4 (four) times daily. Use as directed. Pharmacy, please dispense this brand of blood glucose test strips: Bayer Contour Next 03/09/17   [provider]  insulin glargine (LANTUS) 100 UNIT/ML injection Inject 40  Units into the skin 2 (two) times daily.    [provider]  insulin lispro (HUMALOG) 100 UNIT/ML injection Inject 20 Units into the skin See admin instructions. Inject up to 20 units subcutaneously three times daily before meals if needed Per sliding scale as directed    [provider]  levothyroxine (SYNTHROID, LEVOTHROID) 150 MCG tablet Take 150 mcg by mouth daily before breakfast.    [provider]  lidocaine-prilocaine (EMLA) cream APPLY SMALL AMOUNT TO ACCESS SITE (AVF) 3 TIMES A WEEK 1 HOUR BEFORE DIALYSIS. COVER WITH OCCLUSIVE DRESSING (SARAN WRAP) 11/11/18   [provider]  lisinopril (PRINIVIL,ZESTRIL) 20 MG tablet Take 20 mg by mouth at bedtime.  10/24/17   [provider]  multivitamin (RENA-VIT) TABS tablet Take 1 tablet by mouth at bedtime.     [provider]  RENVELA 2.4 g PACK VIGOROUSLY [place] 1 PACKET IN WATER (IT WILL NOT DISSOLVE) AND DRINK 3 TIMES DAILY WITH MEALS AND TWICE DAILY WITH SNACKS 12/02/18   [provider]  RESTASIS 0.05 % ophthalmic emulsion Place 1 drop into both eyes 2 (two) times daily as needed (for dry/irritated eyes).  12/29/17   [provider]  rosuvastatin (CRESTOR) 40 MG tablet Take 40 mg by mouth daily.    [provider]    Allergies    Erythromycin, Norvasc [amlodipine], Vancomycin, Nsaids, and Wellbutrin [bupropion]  Review of Systems   Review of Systems  Constitutional: Negative for chills and fever.  HENT: Negative for congestion and sore throat.   Eyes: Positive for photophobia. Negative for pain, redness and visual disturbance.  Respiratory: Negative for cough and shortness of breath.   Cardiovascular: Negative for chest pain.  Gastrointestinal: Negative for abdominal pain, nausea and vomiting.  Genitourinary: Negative for dysuria.  Musculoskeletal: Negative for back pain.  Skin: Negative for rash.  Neurological: Positive for dizziness, light-headedness  and headaches. Negative for tremors, seizures, syncope, facial asymmetry, speech difficulty, weakness and numbness.    Physical Exam Updated Vital Signs BP (!) 152/88   Pulse 75   Temp 97.7 F (36.5 C) (Oral)   Resp 16   SpO2 97%   Physical Exam Vitals and nursing note reviewed.  Constitutional:      General: She is not in acute distress.    Appearance: Normal appearance. She is not ill-appearing, toxic-appearing or diaphoretic.  HENT:     Head: Normocephalic.     Mouth/Throat:     Mouth: Mucous membranes are moist.  Eyes:     Conjunctiva/sclera: Conjunctivae normal.  Cardiovascular:     Rate and Rhythm: Normal rate and regular rhythm.  Pulmonary:  Effort: Pulmonary effort is normal.     Breath sounds: Normal breath sounds.  Abdominal:     General: Abdomen is flat. Bowel sounds are normal. There is no distension.     Tenderness: There is no abdominal tenderness.  Skin:    General: Skin is dry.  Neurological:     General: No focal deficit present.     Mental Status: She is alert and oriented to person, place, and time.     Cranial Nerves: No cranial nerve deficit.     Sensory: No sensory deficit.  Psychiatric:        Mood and Affect: Mood normal.     ED Results / Procedures / Treatments   Labs (all labs ordered are listed, but only abnormal results are displayed) Labs Reviewed  COMPREHENSIVE METABOLIC PANEL - Abnormal; Notable for the following components:      Result Value   Potassium 3.2 (*)    Chloride 96 (*)    Creatinine, Ser 3.37 (*)    Albumin 3.3 (*)    GFR calc non Af Amer 14 (*)    GFR calc Af Amer 17 (*)    All other components within normal limits  CBC WITH DIFFERENTIAL/PLATELET - Abnormal; Notable for the following components:   WBC 12.1 (*)    Hemoglobin 11.9 (*)    RDW 17.2 (*)    Platelets 420 (*)    Neutro Abs 8.9 (*)    Abs Immature Granulocytes 0.14 (*)    All other components within normal limits  POC SARS CORONAVIRUS 2 AG -  ED     EKG None  Radiology CT Head Wo Contrast  Result Date: 08/21/2019 CLINICAL DATA:  Headache EXAM: CT HEAD WITHOUT CONTRAST TECHNIQUE: Contiguous axial images were obtained from the base of the skull through the vertex without intravenous contrast. COMPARISON:  09/22/2012.  MRI 03/07/2015 FINDINGS: Brain: Chronic appearing right basal ganglia lacunar infarct. Subtle low-density area noted posteriorly in the right occipital lobe, image 10 of series 3. Similar appearance is noted on study from 2014. This area was normal on MRI from 2016. Therefore, this is favored to be artifactual. No acute intracranial abnormality. Specifically, no hemorrhage, hydrocephalus, mass lesion, acute infarction, or significant intracranial injury. Vascular: No hyperdense vessel or unexpected calcification. Skull: No acute calvarial abnormality. Sinuses/Orbits: Visualized paranasal sinuses and mastoids clear. Orbital soft tissues unremarkable. Other: None IMPRESSION: Chronic appearing right basal ganglia lacunar infarct. Subtle low-density in the region of the right occipital lobe. This is felt to be artifactual. If there is clinical concern for acute right occipital infarct, this could be further evaluated with MRI. Electronically Signed   By: Rolm Baptise M.D.   On: 08/21/2019 17:56    Procedures Procedures (including critical care time)  Medications Ordered in ED Medications  prochlorperazine (COMPAZINE) injection 10 mg (10 mg Intravenous Given 08/21/19 1646)  diphenhydrAMINE (BENADRYL) injection 25 mg (25 mg Intravenous Given 08/21/19 1646)  HYDROcodone-acetaminophen (NORCO/VICODIN) 5-325 MG per tablet 1 tablet (1 tablet Oral Given 08/21/19 1825)  lisinopril (ZESTRIL) tablet 20 mg (20 mg Oral Given 08/21/19 1825)  carvedilol (COREG) tablet 12.5 mg (12.5 mg Oral Given 08/21/19 1827)  hydrALAZINE (APRESOLINE) injection 10 mg (10 mg Intravenous Given 08/21/19 1929)    ED Course  I have reviewed the triage vital signs and the  nursing notes.  Pertinent labs & imaging results that were available during my care of the patient were reviewed by me and considered in my medical decision making (see  chart for details).  Clinical Course as of Aug 21 2035  Mon Aug 21, 2019  2033 Patient presenting from dialysis due to hypertension and headache.  Patient reports that she usually gets a headache in the third hour of her dialysis treatment which is what happened today.  However, she reports that her headache is worse than usual today.  Her blood pressure was 123456 systolically.  She normally is normotensive.  Reports she has not taken any of her blood pressure medication today.  Labs were drawn and head CT was performed.  Overall work-up was reassuring.  Her Covid test was negative.  She is not having any meningeal signs, vision changes.  She was given hydralazine, Compazine, Benadryl and she had great improvement to her baseline.  Her blood pressure improved to 160/75.  No neurological symptoms on my exam.  Patient was also seen and evaluated by Dr. Langston Masker who agrees with the plan to discharge home and return to dialysis on Wednesday.  Patient was advised on return precautions.   [KM]    Clinical Course User Index [KM] Kristine Royal   MDM Rules/Calculators/A&P                      Based on review of vitals, medical screening exam, lab work and/or imaging, there does not appear to be an acute, emergent etiology for the patient's symptoms. Counseled pt on good return precautions and encouraged both PCP and ED follow-up as needed.  Prior to discharge, I also discussed incidental imaging findings with patient in detail and advised appropriate, recommended follow-up in detail.  Clinical Impression: 1. Nonintractable headache, unspecified chronicity pattern, unspecified headache type   2. Hypertension, unspecified type     Disposition: Discharge  Prior to providing a prescription for a controlled substance, I independently  reviewed the patient's recent prescription history on the [facility]. The patient had no recent or regular prescriptions and was deemed appropriate for a brief, less than 3 day prescription of narcotic for acute analgesia.  This note was prepared with assistance of Systems analyst. Occasional wrong-word or sound-a-like substitutions may have occurred due to the inherent limitations of voice recognition software.  Final Clinical Impression(s) / ED Diagnoses Final diagnoses:  Nonintractable headache, unspecified chronicity pattern, unspecified headache type  Hypertension, unspecified type    Rx / DC Orders ED Discharge Orders    None       Kristine Royal 08/21/19 2037    Wyvonnia Dusky, MD 08/22/19 1320

## 2019-08-21 NOTE — ED Notes (Signed)
Patient verbalizes understanding of discharge instructions. Opportunity for questioning and answers were provided. Armband removed by staff, pt discharged from ED. Pt. ambulatory and discharged home.  

## 2019-08-21 NOTE — ED Notes (Signed)
PT arrived with clamp to left arm from dialysis center  Dialysis team called to d/c clamp on PT's fistula Per staff CN will be down to the ED in about 1 hour to remove

## 2019-08-21 NOTE — Discharge Instructions (Signed)
You are seen today for headache and feeling unwell during dialysis.  Your blood pressure was very elevated.  We were able to reduce your blood pressure and your headache with medications here in the emergency department.  Please make sure you monitor your blood pressures at home and return to the emergency department if you have any new or worsening symptoms.  Your head CT and lab work were all reassuring.

## 2019-11-15 ENCOUNTER — Telehealth (INDEPENDENT_AMBULATORY_CARE_PROVIDER_SITE_OTHER): Payer: Medicare Other | Admitting: Psychiatry

## 2019-11-15 ENCOUNTER — Other Ambulatory Visit: Payer: Self-pay

## 2019-11-15 ENCOUNTER — Encounter (HOSPITAL_COMMUNITY): Payer: Self-pay | Admitting: Psychiatry

## 2019-11-15 DIAGNOSIS — F4310 Post-traumatic stress disorder, unspecified: Secondary | ICD-10-CM

## 2019-11-15 DIAGNOSIS — F411 Generalized anxiety disorder: Secondary | ICD-10-CM

## 2019-11-15 DIAGNOSIS — F330 Major depressive disorder, recurrent, mild: Secondary | ICD-10-CM | POA: Diagnosis not present

## 2019-11-15 MED ORDER — DULOXETINE HCL 60 MG PO CPEP: 60.0000 mg | ORAL_CAPSULE | Freq: Every day | ORAL | 2 refills | Status: DC

## 2019-11-15 MED ORDER — CLONAZEPAM 1 MG PO TABS: ORAL_TABLET | ORAL | 1 refills | Status: DC
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
## 2019-12-01 ENCOUNTER — Emergency Department (HOSPITAL_COMMUNITY)
Admission: EM | Admit: 2019-12-01 | Discharge: 2019-12-01 | Disposition: A | Discharge status: Home / Self Care | Payer: Medicare Other | Attending: Emergency Medicine | Admitting: Emergency Medicine

## 2019-12-01 ENCOUNTER — Encounter (HOSPITAL_COMMUNITY): Payer: Self-pay | Admitting: Emergency Medicine

## 2019-12-01 DIAGNOSIS — R109 Unspecified abdominal pain: Secondary | ICD-10-CM | POA: Diagnosis not present

## 2019-12-01 DIAGNOSIS — Z5321 Procedure and treatment not carried out due to patient leaving prior to being seen by health care provider: Secondary | ICD-10-CM | POA: Insufficient documentation

## 2019-12-01 LAB — BASIC METABOLIC PANEL
Anion gap: 14 (ref 5–15)
BUN: 11 mg/dL (ref 6–20)
CO2: 30 mmol/L (ref 22–32)
Calcium: 7.6 mg/dL — ABNORMAL LOW (ref 8.9–10.3)
Chloride: 90 mmol/L — ABNORMAL LOW (ref 98–111)
Creatinine, Ser: 2.85 mg/dL — ABNORMAL HIGH (ref 0.44–1.00)
GFR calc Af Amer: 20 mL/min — ABNORMAL LOW (ref >60)
GFR calc non Af Amer: 18 mL/min — ABNORMAL LOW (ref >60)
Glucose, Bld: 145 mg/dL — ABNORMAL HIGH (ref 70–99)
Potassium: 3.1 mmol/L — ABNORMAL LOW (ref 3.5–5.1)
Sodium: 134 mmol/L — ABNORMAL LOW (ref 135–145)

## 2019-12-01 LAB — CBC
HCT: 33.3 % — ABNORMAL LOW (ref 36.0–46.0)
Hemoglobin: 10.8 g/dL — ABNORMAL LOW (ref 12.0–15.0)
MCH: 31.4 pg (ref 26.0–34.0)
MCHC: 32.4 g/dL (ref 30.0–36.0)
MCV: 96.8 fL (ref 80.0–100.0)
Platelets: 292 10*3/uL (ref 150–400)
RBC: 3.44 MIL/uL — ABNORMAL LOW (ref 3.87–5.11)
RDW: 13.9 % (ref 11.5–15.5)
WBC: 9 10*3/uL (ref 4.0–10.5)
nRBC: 0 % (ref 0.0–0.2)

## 2019-12-01 LAB — I-STAT BETA HCG BLOOD, ED (MC, WL, AP ONLY): I-stat hCG, quantitative: 6 m[IU]/mL — ABNORMAL HIGH (ref <5)

## 2019-12-01 NOTE — ED Triage Notes (Signed)
Patient in POV, sent by dialysis center for further eval of flank pain X2 days. Concern for kidney stone. MWF dialysis patient, did complete full treatment today.

## 2019-12-01 NOTE — ED Notes (Signed)
Patient was advised not to leave against medical recommendations. Pt will try to come back in the AM.

## 2019-12-02 ENCOUNTER — Encounter (HOSPITAL_COMMUNITY): Payer: Self-pay | Admitting: Emergency Medicine

## 2019-12-02 ENCOUNTER — Emergency Department (HOSPITAL_COMMUNITY): Payer: Medicare Other

## 2019-12-02 ENCOUNTER — Emergency Department (HOSPITAL_COMMUNITY)
Admission: EM | Admit: 2019-12-02 | Discharge: 2019-12-02 | Disposition: A | Discharge status: Home / Self Care | Payer: Medicare Other | Attending: Emergency Medicine | Admitting: Emergency Medicine

## 2019-12-02 ENCOUNTER — Other Ambulatory Visit: Payer: Self-pay

## 2019-12-02 DIAGNOSIS — N10 Acute tubulo-interstitial nephritis: Secondary | ICD-10-CM | POA: Diagnosis not present

## 2019-12-02 DIAGNOSIS — Z79899 Other long term (current) drug therapy: Secondary | ICD-10-CM | POA: Diagnosis not present

## 2019-12-02 DIAGNOSIS — E039 Hypothyroidism, unspecified: Secondary | ICD-10-CM | POA: Insufficient documentation

## 2019-12-02 DIAGNOSIS — Z992 Dependence on renal dialysis: Secondary | ICD-10-CM | POA: Insufficient documentation

## 2019-12-02 DIAGNOSIS — I120 Hypertensive chronic kidney disease with stage 5 chronic kidney disease or end stage renal disease: Secondary | ICD-10-CM | POA: Diagnosis not present

## 2019-12-02 DIAGNOSIS — N186 End stage renal disease: Secondary | ICD-10-CM | POA: Insufficient documentation

## 2019-12-02 DIAGNOSIS — Z794 Long term (current) use of insulin: Secondary | ICD-10-CM | POA: Insufficient documentation

## 2019-12-02 DIAGNOSIS — R109 Unspecified abdominal pain: Secondary | ICD-10-CM

## 2019-12-02 DIAGNOSIS — R112 Nausea with vomiting, unspecified: Secondary | ICD-10-CM | POA: Insufficient documentation

## 2019-12-02 DIAGNOSIS — E1122 Type 2 diabetes mellitus with diabetic chronic kidney disease: Secondary | ICD-10-CM | POA: Insufficient documentation

## 2019-12-02 DIAGNOSIS — R1032 Left lower quadrant pain: Secondary | ICD-10-CM | POA: Diagnosis present

## 2019-12-02 DIAGNOSIS — N12 Tubulo-interstitial nephritis, not specified as acute or chronic: Secondary | ICD-10-CM

## 2019-12-02 LAB — CBC WITH DIFFERENTIAL/PLATELET
Abs Immature Granulocytes: 0.06 10*3/uL (ref 0.00–0.07)
Basophils Absolute: 0.1 10*3/uL (ref 0.0–0.1)
Basophils Relative: 1 %
Eosinophils Absolute: 0.4 10*3/uL (ref 0.0–0.5)
Eosinophils Relative: 4 %
HCT: 32.9 % — ABNORMAL LOW (ref 36.0–46.0)
Hemoglobin: 10.2 g/dL — ABNORMAL LOW (ref 12.0–15.0)
Immature Granulocytes: 1 %
Lymphocytes Relative: 23 %
Lymphs Abs: 2.1 10*3/uL (ref 0.7–4.0)
MCH: 30.6 pg (ref 26.0–34.0)
MCHC: 31 g/dL (ref 30.0–36.0)
MCV: 98.8 fL (ref 80.0–100.0)
Monocytes Absolute: 0.8 10*3/uL (ref 0.1–1.0)
Monocytes Relative: 8 %
Neutro Abs: 5.8 10*3/uL (ref 1.7–7.7)
Neutrophils Relative %: 63 %
Platelets: 288 10*3/uL (ref 150–400)
RBC: 3.33 MIL/uL — ABNORMAL LOW (ref 3.87–5.11)
RDW: 14 % (ref 11.5–15.5)
WBC: 9.2 10*3/uL (ref 4.0–10.5)
nRBC: 0 % (ref 0.0–0.2)

## 2019-12-02 LAB — COMPREHENSIVE METABOLIC PANEL
ALT: 23 U/L (ref 0–44)
AST: 25 U/L (ref 15–41)
Albumin: 3 g/dL — ABNORMAL LOW (ref 3.5–5.0)
Alkaline Phosphatase: 88 U/L (ref 38–126)
Anion gap: 17 — ABNORMAL HIGH (ref 5–15)
BUN: 36 mg/dL — ABNORMAL HIGH (ref 6–20)
CO2: 29 mmol/L (ref 22–32)
Calcium: 7.9 mg/dL — ABNORMAL LOW (ref 8.9–10.3)
Chloride: 92 mmol/L — ABNORMAL LOW (ref 98–111)
Creatinine, Ser: 5.74 mg/dL — ABNORMAL HIGH (ref 0.44–1.00)
GFR calc Af Amer: 9 mL/min — ABNORMAL LOW (ref >60)
GFR calc non Af Amer: 8 mL/min — ABNORMAL LOW (ref >60)
Glucose, Bld: 185 mg/dL — ABNORMAL HIGH (ref 70–99)
Potassium: 4.2 mmol/L (ref 3.5–5.1)
Sodium: 138 mmol/L (ref 135–145)
Total Bilirubin: 1 mg/dL (ref 0.3–1.2)
Total Protein: 7.2 g/dL (ref 6.5–8.1)

## 2019-12-02 LAB — URINALYSIS, ROUTINE W REFLEX MICROSCOPIC
Bilirubin Urine: NEGATIVE
Glucose, UA: >=500 mg/dL — AB
Hgb urine dipstick: NEGATIVE
Ketones, ur: NEGATIVE mg/dL
Leukocytes,Ua: NEGATIVE
Nitrite: NEGATIVE
Protein, ur: >=300 mg/dL — AB
Specific Gravity, Urine: 1.018 (ref 1.005–1.030)
pH: 7 (ref 5.0–8.0)

## 2019-12-02 LAB — LIPASE, BLOOD: Lipase: 71 U/L — ABNORMAL HIGH (ref 11–51)

## 2019-12-02 MED ORDER — HYDROCODONE-ACETAMINOPHEN 5-325 MG PO TABS
1.0000 | ORAL_TABLET | Freq: Once | ORAL | Status: AC
  Administered 2019-12-02: 1 via ORAL
  Filled 2019-12-02: qty 1

## 2019-12-02 MED ORDER — HYDROMORPHONE HCL 1 MG/ML IJ SOLN
1.0000 mg | Freq: Once | INTRAMUSCULAR | Status: AC
  Administered 2019-12-02: 1 mg via INTRAVENOUS
  Filled 2019-12-02: qty 1

## 2019-12-02 MED ORDER — SODIUM CHLORIDE 0.9 % IV BOLUS
500.0000 mL | Freq: Once | INTRAVENOUS | Status: AC
  Administered 2019-12-02: 500 mL via INTRAVENOUS

## 2019-12-02 MED ORDER — HYDROCODONE-ACETAMINOPHEN 5-325 MG PO TABS: 2.0000 | ORAL_TABLET | ORAL | 0 refills | Status: DC | PRN

## 2019-12-02 MED ORDER — ONDANSETRON 4 MG PO TBDP: 4.0000 mg | ORAL_TABLET | Freq: Three times a day (TID) | ORAL | 0 refills | Status: AC | PRN

## 2019-12-02 MED ORDER — CEFDINIR 300 MG PO CAPS
300.0000 mg | ORAL_CAPSULE | ORAL | 0 refills | Status: AC
Start: 2019-12-04 — End: 2019-12-14

## 2019-12-02 MED ORDER — CEFDINIR 300 MG PO CAPS
300.0000 mg | ORAL_CAPSULE | Freq: Once | ORAL | Status: AC
  Administered 2019-12-02: 300 mg via ORAL
  Filled 2019-12-02: qty 1

## 2019-12-02 MED ORDER — ONDANSETRON HCL 4 MG/2ML IJ SOLN: 4.0000 mg | Freq: Once | INTRAMUSCULAR | Status: DC | PRN

## 2019-12-02 NOTE — ED Triage Notes (Signed)
C/o L flank pain x 3 days with nausea and vomiting.  Denies urinary complaints.  Pt had dialysis yesterday and states her nephrologist sent her to ED yesterday for ? Kidney stone but she LWBS due to wait.

## 2019-12-02 NOTE — Discharge Instructions (Addendum)
You were given your first dose of antibiotic today.  Take the next dose on 5/24.  Otherwise, follow-up closely with your primary care physician.  If you develop fever, uncontrolled vomiting, uncontrolled pain, if you notice a new or worsening rash, or any other new/concerning symptoms then return to the ER for evaluation.

## 2019-12-02 NOTE — ED Notes (Signed)
Walked patient to the bathroom patient did well but patient wasn't able to get a urine sample will try again later

## 2019-12-02 NOTE — ED Notes (Signed)
Bladder scan patient the first scan I got 83 ml the I got 65ml more patient is resting with call bell in reach

## 2019-12-02 NOTE — ED Notes (Signed)
Patient verbalizes understanding of discharge instructions. Opportunity for questioning and answers were provided. Pt discharged from ED. 

## 2019-12-02 NOTE — ED Provider Notes (Addendum)
[facility] EMERGENCY DEPARTMENT Provider Note   CSN: [id number] Arrival date & time: 12/02/19  2841     History Chief Complaint  Patient presents with  . Flank Pain    Suzanne Allen is a 59 y.o. female.  HPI 59 year old female presents with flank pain.  Has had left-sided pain for the past 3 days.  No fevers.  Has had on and off nausea and vomiting.  No urinary symptoms.  She is on dialysis and had dialysis yesterday.  Pain is currently about a 7 out of 10.  It is a constant pain though it does at times get worse, especially at night and laying flat.  Is mostly a dull/ache but also feels sharp when it is worse.  Pain seems to wrap around to her left lower abdomen and groin. Remote history of kidney stones, this feels somewhat similar but also different.   Past Medical History:  Diagnosis Date  . 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 Anemia   . Anxiety   . CAP (community acquired pneumonia) 03/13/2015  . Carpal tunnel syndrome 11/28/2014   Bilateral  . Chronic pancreatitis ([place])   . Depression   . Diabetes mellitus type I ([place]) dx'd 1977  . Diabetic osteomyelitis ([place])    right great toe/progress notes 09/23/2012  . Diabetic retinopathy ([place])   . ESRD (end stage renal disease) ([place])    ESRD- Hemo MWF- [facility]  . Fatty liver 04/04/2014  . HTN (hypertension)   . Hypercholesteremia   . Hypothyroidism   . Macular degeneration, bilateral   . Migraines 1980's   "when I was in my 20's" (03/14/2015)  . Peripheral neuropathy   . Pneumonia 1993  . PTSD (post-traumatic stress disorder)   . Stroke [facility]) ? date & 09/22/2012   TIA's - no resuidual  . TIA (transient ischemic attack) 09/22/2012   [name] 10/25/2012    Patient Active Problem List   Diagnosis Date Noted  . ESRD on dialysis ([place]) 11/01/2018  . Ulcer of right heel, limited to breakdown of skin ([place]) 08/15/2018  . Amputation of toe of right foot ([place]) 08/10/2018  . CKD (chronic kidney  disease) stage 5, GFR less than 15 ml/min (HCC) 12/22/2017  . Chest pain 09/20/2017  . Diabetic nephropathy associated with type 1 diabetes mellitus ([place]) 03/03/2017  . Cellulitis of left foot 07/23/2016  . Status post transmetatarsal amputation of foot, right ([place]) 07/17/2016  . Major depressive disorder, recurrent episode, mild ([place]) 05/04/2016  . Osteomyelitis ([place]) 07/08/2015  . Poorly controlled diabetes mellitus ([place]) 07/08/2015  . Depression with anxiety 07/08/2015  . Lactic acid acidosis 07/08/2015  . BP (high blood pressure) 06/17/2015  . 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 Hypothyroidism, postop 06/17/2015  . 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 Proliferative diabetic retinopathy ([place]) 06/17/2015  . Dysesthesia 06/17/2015  . CAP (community acquired pneumonia) 03/13/2015  . Dyspnea 03/13/2015  . Respiratory failure with hypoxia ([place]) 03/13/2015  . Pericardial effusion 03/13/2015  . Depression 02/15/2015  . Carpal tunnel syndrome 11/28/2014  . Diabetes, polyneuropathy ([place]) 11/15/2014  . Numbness 11/15/2014  . Gait disturbance 11/15/2014  . Hand weakness 11/15/2014  . Insomnia 11/15/2014  . DDD (degenerative disc disease), lumbar 04/10/2014  . Diarrhea 04/05/2014  . Dyslipidemia 04/04/2014  . Fatty liver 04/04/2014  . Hypokalemia 04/04/2014  . Elevated LFTs 04/04/2014  . Benign essential HTN 04/02/2014  . Hypothyroidism 04/02/2014  . Diabetes ([place]) 10/10/2013  . Pancreatitis, acute 10/10/2013  .  Leukocytosis 10/04/2013  . Acute pancreatitis 10/04/2013  . Diabetic neuropathy ([place]) 10/03/2013  . TIA (transient ischemic attack) 09/23/2012  . Temporary cerebral vascular dysfunction 09/23/2012  . Diabetic osteomyelitis ([place]) 08/29/2012  . Diabetes type 1, uncontrolled ([place]) 08/29/2012  . Type 1 DM with CKD and hypertension ([place]) 08/29/2012  . Type 2 diabetes mellitus ([place]) 08/29/2012  . Diabetic foot ulcer ([place]) 07/27/2012  . Anxiety  08/24/2011  . Arthralgia of multiple joints 10/01/2010  . HLD (hyperlipidemia) 04/25/2010  . Avitaminosis D 04/25/2010  . Allergic rhinitis 08/16/2008    Past Surgical History:  Procedure Laterality Date  . A/V FISTULAGRAM N/A 08/31/2018   Procedure: A/V FISTULAGRAM - Left arm;  Surgeon: Marty Heck, MD;  Location: [facility];  Service: Cardiovascular;  Laterality: N/A;  . A/V FISTULAGRAM N/A 11/04/2018   Procedure: A/V FISTULAGRAM - Left Arm;  Surgeon: Marty Heck, MD;  Location: [facility];  Service: Cardiovascular;  Laterality: N/A;  . AMPUTATION Right 07/12/2015   Procedure: Right Transmetatarsal amputation;  Surgeon: Newt Minion, MD;  Location: [facility];  Service: Orthopedics;  Laterality: Right;  . BASCILIC VEIN TRANSPOSITION Left 12/31/2017   Procedure: FIRST STAGE BASILIC VEIN TRANSPOSITION LEFT ARM;  Surgeon: Conrad Hunt, MD;  Location: [facility];  Service: Vascular;  Laterality: Left;  . BASCILIC VEIN TRANSPOSITION Left 02/21/2018   Procedure: SECOND STAGE BASILIC VEIN TRANSPOSITION, LEFT UPPER ARM;  Surgeon: Marty Heck, MD;  Location: [facility];  Service: Vascular;  Laterality: Left;  . CATARACT EXTRACTION, BILATERAL    . [place]; 1995  . EYE SURGERY Bilateral    "hemorrhaged behind my eye"  . I & D EXTREMITY Right multiple   "big toe"  . INCISION AND DRAINAGE ABSCESS Left 12/08/2018   Procedure: Incision And Drainage of Left Axillary Abscess;  Surgeon: Marty Heck, MD;  Location: [facility];  Service: Vascular;  Laterality: Left;  . PERIPHERAL VASCULAR INTERVENTION Left 08/31/2018   Procedure: PERIPHERAL VASCULAR INTERVENTION;  Surgeon: Marty Heck, MD;  Location: [facility];  Service: Cardiovascular;  Laterality: Left;  . PERIPHERAL VASCULAR INTERVENTION Left 11/04/2018   Procedure: PERIPHERAL VASCULAR INTERVENTION;  Surgeon: Marty Heck, MD;  Location: [facility];  Service: Cardiovascular;   Laterality: Left;  arm fistula  . PERIPHERALLY INSERTED CENTRAL CATHETER INSERTION Right 08/31/2012  . PICC LINE REMOVAL ([place] HX) Right 10/12/2012   [name] 10/12/2012  . REFRACTIVE SURGERY Bilateral 2013  . TOE AMPUTATION Right 11/2012   "big toe"  . TONSILLECTOMY  1965  . TOTAL THYROIDECTOMY  2012  . TUBAL LIGATION  1995     OB History   No obstetric history on file.     Family History  Problem Relation Age of Onset  . Depression Mother   . Diabetes Mother   . Alcohol abuse Father   . Alcohol abuse Sister   . Diabetes Maternal Grandmother     Social History   Tobacco Use  . Smoking status: Never Smoker  . Smokeless tobacco: Never Used  Substance Use Topics  . Alcohol use: No    Alcohol/week: 0.0 standard drinks  . Drug use: No    Home Medications Prior to Admission medications   Medication Sig Start Date End Date Taking? Authorizing Provider  Ascorbic Acid (VITAMIN C GUMMIE PO) Take 2 tablets by mouth daily.    [provider]  carvedilol (COREG) 12.5 MG tablet Take 12.5 mg by mouth  2 (two) times daily with a meal.     [provider]  cefdinir (OMNICEF) 300 MG capsule Take 1 capsule (300 mg total) by mouth every other day for 10 days. 12/04/19 12/14/19  Sherwood Gambler, MD  Cholecalciferol (VITAMIN D-3) 125 MCG (5000 UT) TABS Take 10,000 Units by mouth daily.    [provider]  clonazePAM (KLONOPIN) 1 MG tablet Take one tab at night before dialysis day and 2nd as needed for severe anxiety. 11/15/19   Arfeen, Arlyce Harman, MD  dorzolamide-timolol (COSOPT) 22.3-6.8 MG/ML ophthalmic solution Place 1 drop into both eyes 2 (two) times daily.     [provider]  DULoxetine (CYMBALTA) 60 MG capsule Take 1 capsule (60 mg total) by mouth daily. 11/15/19   Arfeen, Arlyce Harman, MD  glucose blood (CONTOUR NEXT TEST) test strip one strip (1 each total) by Other route 4 (four) times daily. Use as directed. Pharmacy, please dispense this brand of blood glucose test  strips: Bayer Contour Next 03/09/17   [provider]  HYDROcodone-acetaminophen (NORCO) 5-325 MG tablet Take 2 tablets by mouth every 4 (four) hours as needed for severe pain. 12/02/19   Sherwood Gambler, MD  insulin glargine (LANTUS) 100 UNIT/ML injection Inject 40 Units into the skin 2 (two) times daily.    [provider]  insulin lispro (HUMALOG) 100 UNIT/ML injection Inject 20 Units into the skin See admin instructions. Inject up to 20 units subcutaneously three times daily before meals if needed Per sliding scale as directed    [provider]  levothyroxine (SYNTHROID, LEVOTHROID) 150 MCG tablet Take 150 mcg by mouth daily before breakfast.    [provider]  lidocaine-prilocaine (EMLA) cream APPLY SMALL AMOUNT TO ACCESS SITE (AVF) 3 TIMES A WEEK 1 HOUR BEFORE DIALYSIS. COVER WITH OCCLUSIVE DRESSING (SARAN WRAP) 11/11/18   [provider]  lisinopril (PRINIVIL,ZESTRIL) 20 MG tablet Take 20 mg by mouth at bedtime.  10/24/17   [provider]  multivitamin (RENA-VIT) TABS tablet Take 1 tablet by mouth at bedtime.     [provider]  ondansetron (ZOFRAN ODT) 4 MG disintegrating tablet Take 1 tablet (4 mg total) by mouth every 8 (eight) hours as needed for nausea or vomiting. 12/02/19   Sherwood Gambler, MD  RENVELA 2.4 g PACK VIGOROUSLY MIX CONTENTS OF 1 PACKET IN WATER (IT WILL NOT DISSOLVE) AND DRINK 3 TIMES DAILY WITH MEALS AND TWICE DAILY WITH SNACKS 12/02/18   [provider]  RESTASIS 0.05 % ophthalmic emulsion Place 1 drop into both eyes 2 (two) times daily as needed (for dry/irritated eyes).  12/29/17   [provider]  rosuvastatin (CRESTOR) 40 MG tablet Take 40 mg by mouth daily.    [provider]    Allergies    Erythromycin, Norvasc [amlodipine], Vancomycin, Nsaids, and Wellbutrin [bupropion]  Review of Systems   Review of Systems  Constitutional: Negative for fever.  Respiratory: Negative for  shortness of breath.   Cardiovascular: Negative for chest pain.  Gastrointestinal: Positive for abdominal pain, nausea and vomiting.  Genitourinary: Positive for flank pain. Negative for dysuria and hematuria.  All other systems reviewed and are negative.   Physical Exam Updated Vital Signs BP 108/71   Pulse 73   Temp 97.6 F (36.4 C) (Oral)   Resp 11   Ht 5\' 3"  (1.6 m)   Wt 67.1 kg   SpO2 95%   BMI 26.22 kg/m   Physical Exam Vitals and nursing note reviewed.  Constitutional:  General: She is not in acute distress.    Appearance: She is well-developed. She is not ill-appearing or diaphoretic.  HENT:     Head: Normocephalic and atraumatic.     Right Ear: External ear normal.     Left Ear: External ear normal.     Nose: Nose normal.  Eyes:     General:        Right eye: No discharge.        Left eye: No discharge.  Cardiovascular:     Rate and Rhythm: Normal rate and regular rhythm.     Heart sounds: Normal heart sounds.  Pulmonary:     Effort: Pulmonary effort is normal.     Breath sounds: Normal breath sounds.  Abdominal:     Palpations: Abdomen is soft.     Tenderness: There is no abdominal tenderness.  Musculoskeletal:       Back:     Comments: TTP a little inferior to CVA Inferior to this  Skin:    General: Skin is warm and dry.  Neurological:     Mental Status: She is alert.  Psychiatric:        Mood and Affect: Mood is not anxious.     ED Results / Procedures / Treatments   Labs (all labs ordered are listed, but only abnormal results are displayed) Labs Reviewed  URINALYSIS, ROUTINE W REFLEX MICROSCOPIC - Abnormal; Notable for the following components:      Result Value   APPearance HAZY (*)    Glucose, UA >=500 (*)    Protein, ur >=300 (*)    Bacteria, UA RARE (*)    Non Squamous Epithelial 0-5 (*)    All other components within normal limits  COMPREHENSIVE METABOLIC PANEL - Abnormal; Notable for the following components:   Chloride 92  (*)    Glucose, Bld 185 (*)    BUN 36 (*)    Creatinine, Ser 5.74 (*)    Calcium 7.9 (*)    Albumin 3.0 (*)    GFR calc non Af Amer 8 (*)    GFR calc Af Amer 9 (*)    Anion gap 17 (*)    All other components within normal limits  LIPASE, BLOOD - Abnormal; Notable for the following components:   Lipase 71 (*)    All other components within normal limits  CBC WITH DIFFERENTIAL/PLATELET - Abnormal; Notable for the following components:   RBC 3.33 (*)    Hemoglobin 10.2 (*)    HCT 32.9 (*)    All other components within normal limits  URINE CULTURE    EKG None  Radiology CT Renal Stone Study  Result Date: 12/02/2019 CLINICAL DATA:  59 year old female with a history of left flank pain, nausea, vomiting EXAM: CT ABDOMEN AND PELVIS WITHOUT CONTRAST TECHNIQUE: Multidetector CT imaging of the abdomen and pelvis was performed following the standard protocol without IV contrast. COMPARISON:  04/09/2014 FINDINGS: Lower chest: No acute finding of the lower chest Hepatobiliary: Unremarkable liver. Gallbladder demonstrates hyperdense material within the dependent gallbladder lumen. No gallbladder wall thickening or pericholecystic fluid. Unremarkable appearance of the extrahepatic biliary ducts. Pancreas: Unremarkable Spleen: Unremarkable Adrenals/Urinary Tract: - Right adrenal gland:  Unremarkable - Left adrenal gland: Unremarkable. - Right kidney: No hydronephrosis. Mild perinephric stranding. No nephrolithiasis. Unremarkable course of the right ureter. - Left Kidney: No hydronephrosis. Mild perinephric stranding. No nephrolithiasis. Unremarkable course of the left ureter. - Urinary Bladder: Urinary bladder is decompressed. Stomach/Bowel: - Stomach: Unremarkable. - Small  bowel: Unremarkable - Appendix: Appendix is not visualized, however, no inflammatory changes are present adjacent to the cecum to indicate an appendicitis. - Colon: Moderate stool burden.  No focal inflammatory changes.  Vascular/Lymphatic: Atherosclerotic changes of the abdominal aorta. No aneurysm. Small lymph nodes within the periaortic/preaortic nodal stations. No mesenteric adenopathy. Small lymph nodes in the bilateral inguinal region. Small lymph nodes in the bilateral pelvic sidewall and along the iliac chain bilaterally. Reproductive: Unremarkable uterus/adnexa. Other: Small fat containing umbilical hernia. Musculoskeletal: No acute fracture. Degenerative changes of the spine. IMPRESSION: Negative for nephrolithiasis or hydronephrosis. Trace stranding around the bilateral kidneys, likely related to the patient's history of renal failure. If there is concern for urinary tract infection, recommend correlation with urinalysis. There are prominent though nonenlarged lymph nodes in the bilateral pelvic sidewall, bilateral inguinal region, and along the external iliac nodal stations. While these are favored to be reactive, suggest confirming that the patient is up-to-date with gynecologic screening studies and GI screening studies, as rarely malignancy of the peroneal region may manifest with lymphadenopathy in this distribution. Aortic Atherosclerosis (ICD10-I70.0). Cholelithiasis without evidence of acute cholecystitis. Electronically Signed   By: Corrie Mckusick D.O.   On: 12/02/2019 10:07    Procedures Procedures (including critical care time)  Medications Ordered in ED Medications  ondansetron (ZOFRAN) injection 4 mg (has no administration in time range)  sodium chloride 0.9 % bolus 500 mL (0 mLs Intravenous Stopped 12/02/19 1259)  HYDROmorphone (DILAUDID) injection 1 mg (1 mg Intravenous Given 12/02/19 1105)  cefdinir (OMNICEF) capsule 300 mg (300 mg Oral Given 12/02/19 1304)  HYDROcodone-acetaminophen (NORCO/VICODIN) 5-325 MG per tablet 1 tablet (1 tablet Oral Given 12/02/19 1305)    ED Course  I have reviewed the triage vital signs and the nursing notes.  Pertinent labs & imaging results that were available  during my care of the patient were reviewed by me and considered in my medical decision making (see chart for details).    MDM Rules/Calculators/A&P                      Unclear exact cause of the patient's mid to low back pain.  It is radicular around to her abdomen, suggesting a ureteral stone but CT is fairly unremarkable.  She does have a very small collection of lesions but this is inferior to where her pain is and there is no other lesions to suggest varicella-zoster.  Urine with small amount of WBCs, questionable if this is urinary tract infection.  Given no other obvious source with the vomiting, will give antibiotics and pain control.  Will treat as pyelonephritis.  However she otherwise is not having vomiting now and pain seems to be controlled.  I think she is stable for outpatient management.  Return precautions discussed. Not c/w spinal emergency Final Clinical Impression(s) / ED Diagnoses Final diagnoses:  Left flank pain  Pyelonephritis    Rx / DC Orders ED Discharge Orders         Ordered    cefdinir (OMNICEF) 300 MG capsule  Every 48 hours     12/02/19 1249    HYDROcodone-acetaminophen (NORCO) 5-325 MG tablet  Every 4 hours PRN     12/02/19 1249    ondansetron (ZOFRAN ODT) 4 MG disintegrating tablet  Every 8 hours PRN     12/02/19 1251           Sherwood Gambler, MD 12/02/19 1319    Sherwood Gambler, MD 12/02/19 1319

## 2019-12-02 NOTE — ED Notes (Signed)
Got patient into a gown on the monitor patient is resting with call bell in reach 

## 2019-12-03 LAB — URINE CULTURE: Culture: NO GROWTH

## 2019-12-05 ENCOUNTER — Emergency Department (HOSPITAL_COMMUNITY)
Admission: EM | Admit: 2019-12-05 | Discharge: 2019-12-05 | Discharge status: Against Medical Advice | Payer: Medicare Other

## 2020-02-15 ENCOUNTER — Other Ambulatory Visit: Payer: Self-pay

## 2020-02-15 ENCOUNTER — Telehealth (INDEPENDENT_AMBULATORY_CARE_PROVIDER_SITE_OTHER): Payer: Medicare Other | Admitting: Psychiatry

## 2020-02-15 ENCOUNTER — Encounter (HOSPITAL_COMMUNITY): Payer: Self-pay | Admitting: Psychiatry

## 2020-02-15 VITALS — Wt 147.0 lb

## 2020-02-15 DIAGNOSIS — F410 Panic disorder [episodic paroxysmal anxiety] without agoraphobia: Secondary | ICD-10-CM | POA: Diagnosis not present

## 2020-02-15 DIAGNOSIS — F330 Major depressive disorder, recurrent, mild: Secondary | ICD-10-CM

## 2020-02-15 DIAGNOSIS — F4310 Post-traumatic stress disorder, unspecified: Secondary | ICD-10-CM | POA: Diagnosis not present

## 2020-02-15 MED ORDER — CLONAZEPAM 1 MG PO TABS: ORAL_TABLET | ORAL | 1 refills | Status: DC

## 2020-02-15 MED ORDER — DULOXETINE HCL 60 MG PO CPEP: 60.0000 mg | ORAL_CAPSULE | Freq: Every day | ORAL | 2 refills | Status: DC

## 2020-02-15 NOTE — Progress Notes (Signed)
Virtual Visit via Telephone Note  I connected with Suzanne Allen on 02/15/20 at  9:20 AM EDT by telephone and verified that I am speaking with the correct person using two identifiers.  Location: Patient: home Provider: home office   I discussed the limitations, risks, security and privacy concerns of performing an evaluation and management service by telephone and the availability of in person appointments. I also discussed with the patient that there may be a patient responsible charge related to this service. The patient expressed understanding and agreed to proceed.   History of Present Illness: Patient is evaluated by phone session.  She is taking her medication and she feels they are working very well.  She is compliant with her dialysis treatment and denies any major panic attack during the dialysis.  She is sleeping good with the Klonopin those nights when next day she does not have to go to dialysis.  She admitted that she is more calm during dialysis and accepting her illness and treatment better than before.  Her hemoglobin A1c is 6.4 and she is happy because she never had that low numbers in the past.  Her weight is stable.  She denies any feeling of hopelessness, crying spells or any suicidal thoughts.  She lives with her daughter and father who are very supportive.  Patient like to keep her current medication because helping her anxiety depression.  She denies any nightmares or flashbacks.  She does not feel she need to see a therapist at this time as her PTSD is under control.   Past Psychiatric History:Reviewed. H/Odepression,anxiety,abusefrom father andPTSD.Witness husband committed suicide in 1996. TriedEffexor, Prozac, Celexa and Abilify.Noh/oinpatient.     Psychiatric Specialty Exam: Physical Exam  Review of Systems  Weight 147 lb (66.7 kg).There is no height or weight on file to calculate BMI.  General Appearance: NA  Eye Contact:  NA  Speech:  Normal  Rate  Volume:  Normal  Mood:  Euthymic  Affect:  NA  Thought Process:  Goal Directed  Orientation:  Full (Time, Place, and Person)  Thought Content:  Logical  Suicidal Thoughts:  No  Homicidal Thoughts:  No  Memory:  Immediate;   Good Recent;   Good Remote;   Good  Judgement:  Intact  Insight:  Present  Psychomotor Activity:  NA  Concentration:  Concentration: Good and Attention Span: Good  Recall:  Good  Fund of Knowledge:  Good  Language:  Good  Akathisia:  No  Handed:  Right  AIMS (if indicated):     Assets:  Communication Skills Desire for Improvement Housing Resilience Social Support  ADL's:  Intact  Cognition:  WNL  Sleep:   ok      Assessment and Plan: Anxiety.  PTSD.  Major depressive disorder, recurrent.  Patient is a stable on her current medication.  She reported her last hemoglobin A1c is 6.4 which is better than before.  She does not want to change medication.  Continue Klonopin 1 mg before dialysis and second if needed and Cymbalta 60 mg every day.  Recommended to call us back if she has any question or any concern.  Follow-up.  Follow Up Instructions:    I discussed the assessment and treatment plan with the patient. The patient was provided an opportunity to ask questions and all were answered. The patient agreed with the plan and demonstrated an understanding of the instructions.   The patient was advised to call back or seek an in-person evaluation if the  symptoms worsen or if the condition fails to improve as anticipated.  I provided 20 minutes of non-face-to-face time during this encounter.   [name], MD

## 2020-05-15 ENCOUNTER — Telehealth (INDEPENDENT_AMBULATORY_CARE_PROVIDER_SITE_OTHER): Payer: Medicare Other | Admitting: Psychiatry

## 2020-05-15 ENCOUNTER — Encounter (HOSPITAL_COMMUNITY): Payer: Self-pay | Admitting: Psychiatry

## 2020-05-15 ENCOUNTER — Other Ambulatory Visit: Payer: Self-pay

## 2020-05-15 DIAGNOSIS — F330 Major depressive disorder, recurrent, mild: Secondary | ICD-10-CM | POA: Diagnosis not present

## 2020-05-15 DIAGNOSIS — F4310 Post-traumatic stress disorder, unspecified: Secondary | ICD-10-CM

## 2020-05-15 DIAGNOSIS — F410 Panic disorder [episodic paroxysmal anxiety] without agoraphobia: Secondary | ICD-10-CM | POA: Diagnosis not present

## 2020-05-15 MED ORDER — CLONAZEPAM 1 MG PO TABS: ORAL_TABLET | ORAL | 1 refills | Status: DC

## 2020-05-15 MED ORDER — DULOXETINE HCL 60 MG PO CPEP: 60.0000 mg | ORAL_CAPSULE | Freq: Every day | ORAL | 2 refills | Status: DC

## 2020-05-15 NOTE — Progress Notes (Signed)
Virtual Visit via Telephone Note  I connected with Suzanne Allen on 05/15/20 at  9:20 AM EDT by telephone and verified that I am speaking with the correct person using two identifiers.  Location: Patient: home Provider: home office   I discussed the limitations, risks, security and privacy concerns of performing an evaluation and management service by telephone and the availability of in person appointments. I also discussed with the patient that there may be a patient responsible charge related to this service. The patient expressed understanding and agreed to proceed.   History of Present Illness: Patient is evaluated by phone session.  She is compliant with dialysis and so far denies any major panic attack or any anxiety attack.  However she noticed lately having dreams at night which started recently and usually these dreams related to coworkers of the dialysis center.  Patient do not recall any major changes in her medication other than that that she is no longer taking muscle relaxant, pain medicine and amlodipine and carvedilol.  She is taking lisinopril.  She feels most of the night she sleeps okay.  She takes Klonopin almost every night and second when she goes to dialysis.  She reported her graft is working and lately there has no major issues.  She is also happy that her hemoglobin A1c improved to now 6.2.  She lost another 10 to 15 pound and she told her nephrologist was happy with the current weight.  Patient lives with her daughter and father who are very supportive.  Patient denies any crying spells, flashback, feelings of hopelessness or worthlessness.  She has no tremors.  She likes to keep her current medication.  She takes Cymbalta every day however takes the Cymbalta on dialysis day after the dialysis to avoid washout during the dialysis.   Past Psychiatric History: H/Odepression,anxiety,abusefrom father andPTSD.Witness husband committed suicide in 1996. TriedEffexor,  Prozac, Celexa and Abilify.Noh/oinpatient.   Psychiatric Specialty Exam: Physical Exam  Review of Systems  Weight 132 lb (59.9 kg).There is no height or weight on file to calculate BMI.  General Appearance: NA  Eye Contact:  NA  Speech:  Normal Rate  Volume:  Normal  Mood:  Euthymic  Affect:  NA  Thought Process:  Goal Directed  Orientation:  Full (Time, Place, and Person)  Thought Content:  WDL  Suicidal Thoughts:  No  Homicidal Thoughts:  No  Memory:  Immediate;   Good Recent;   Good Remote;   Good  Judgement:  Intact  Insight:  Present  Psychomotor Activity:  NA  Concentration:  Concentration: Good and Attention Span: Good  Recall:  Good  Fund of Knowledge:  Good  Language:  Good  Akathisia:  No  Handed:  Right  AIMS (if indicated):     Assets:  Communication Skills Desire for Improvement Housing Resilience Social Support  ADL's:  Intact  Cognition:  WNL  Sleep:         Assessment and Plan: Panic attacks, PTSD.  Major depressive disorder, recurrent.  Patient mostly stable on her current medication.  Discuss her dreams which is recently started and may be related to the medication that she is giving at dialysis.  She is taking Cymbalta and Klonopin for a while and had never mentioned about these dreams.  But she is happy that she has no more nightmares and flashbacks.  I recommend to discuss with her nephrologist if there is a change in her medication that may be contributing to things.  Patient  does not want to change the medication.  We will continue Klonopin 1 mg before dialysis and second if needed.  Continue Cymbalta 60 mg daily.  Patient is not inserted in therapy.  Discussed medication side effects and benefits.  Recommended to call us back if he has any question or any concern.  Follow-up in 3 months.  Follow Up Instructions:    I discussed the assessment and treatment plan with the patient. The patient was provided an opportunity to ask questions and  all were answered. The patient agreed with the plan and demonstrated an understanding of the instructions.   The patient was advised to call back or seek an in-person evaluation if the symptoms worsen or if the condition fails to improve as anticipated.  I provided 18 minutes of non-face-to-face time during this encounter.   [name], MD

## 2020-05-17 ENCOUNTER — Telehealth (HOSPITAL_COMMUNITY): Payer: Medicare Other | Admitting: Psychiatry

## 2020-08-14 ENCOUNTER — Emergency Department (HOSPITAL_COMMUNITY): Payer: Medicare Other

## 2020-08-14 ENCOUNTER — Encounter (HOSPITAL_COMMUNITY): Payer: Self-pay

## 2020-08-14 ENCOUNTER — Other Ambulatory Visit: Payer: Self-pay

## 2020-08-14 ENCOUNTER — Observation Stay (HOSPITAL_COMMUNITY): Payer: Medicare Other

## 2020-08-14 ENCOUNTER — Inpatient Hospital Stay (HOSPITAL_COMMUNITY)
Admission: EM | Admit: 2020-08-14 | Discharge: 2020-08-17 | DRG: 689 | Disposition: A | Discharge status: Home / Self Care | Payer: Medicare Other | Attending: Internal Medicine | Admitting: Internal Medicine

## 2020-08-14 DIAGNOSIS — Z79899 Other long term (current) drug therapy: Secondary | ICD-10-CM

## 2020-08-14 DIAGNOSIS — Z8673 Personal history of transient ischemic attack (TIA), and cerebral infarction without residual deficits: Secondary | ICD-10-CM

## 2020-08-14 DIAGNOSIS — Z818 Family history of other mental and behavioral disorders: Secondary | ICD-10-CM

## 2020-08-14 DIAGNOSIS — Z79890 Hormone replacement therapy (postmenopausal): Secondary | ICD-10-CM

## 2020-08-14 DIAGNOSIS — K760 Fatty (change of) liver, not elsewhere classified: Secondary | ICD-10-CM | POA: Diagnosis present

## 2020-08-14 DIAGNOSIS — E1065 Type 1 diabetes mellitus with hyperglycemia: Secondary | ICD-10-CM | POA: Diagnosis present

## 2020-08-14 DIAGNOSIS — N390 Urinary tract infection, site not specified: Secondary | ICD-10-CM | POA: Diagnosis not present

## 2020-08-14 DIAGNOSIS — Z89431 Acquired absence of right foot: Secondary | ICD-10-CM

## 2020-08-14 DIAGNOSIS — E890 Postprocedural hypothyroidism: Secondary | ICD-10-CM | POA: Diagnosis present

## 2020-08-14 DIAGNOSIS — Z992 Dependence on renal dialysis: Secondary | ICD-10-CM

## 2020-08-14 DIAGNOSIS — I10 Essential (primary) hypertension: Secondary | ICD-10-CM | POA: Diagnosis present

## 2020-08-14 DIAGNOSIS — N186 End stage renal disease: Secondary | ICD-10-CM | POA: Diagnosis not present

## 2020-08-14 DIAGNOSIS — IMO0002 Reserved for concepts with insufficient information to code with codable children: Secondary | ICD-10-CM | POA: Diagnosis present

## 2020-08-14 DIAGNOSIS — E10319 Type 1 diabetes mellitus with unspecified diabetic retinopathy without macular edema: Secondary | ICD-10-CM | POA: Diagnosis present

## 2020-08-14 DIAGNOSIS — F4310 Post-traumatic stress disorder, unspecified: Secondary | ICD-10-CM | POA: Diagnosis present

## 2020-08-14 DIAGNOSIS — Z882 Allergy status to sulfonamides status: Secondary | ICD-10-CM

## 2020-08-14 DIAGNOSIS — J9811 Atelectasis: Secondary | ICD-10-CM | POA: Diagnosis present

## 2020-08-14 DIAGNOSIS — Z886 Allergy status to analgesic agent status: Secondary | ICD-10-CM

## 2020-08-14 DIAGNOSIS — Z20822 Contact with and (suspected) exposure to covid-19: Secondary | ICD-10-CM | POA: Diagnosis present

## 2020-08-14 DIAGNOSIS — Z833 Family history of diabetes mellitus: Secondary | ICD-10-CM

## 2020-08-14 DIAGNOSIS — D72829 Elevated white blood cell count, unspecified: Secondary | ICD-10-CM

## 2020-08-14 DIAGNOSIS — E7800 Pure hypercholesterolemia, unspecified: Secondary | ICD-10-CM | POA: Diagnosis present

## 2020-08-14 DIAGNOSIS — I120 Hypertensive chronic kidney disease with stage 5 chronic kidney disease or end stage renal disease: Secondary | ICD-10-CM | POA: Diagnosis present

## 2020-08-14 DIAGNOSIS — D631 Anemia in chronic kidney disease: Secondary | ICD-10-CM | POA: Diagnosis present

## 2020-08-14 DIAGNOSIS — E1042 Type 1 diabetes mellitus with diabetic polyneuropathy: Secondary | ICD-10-CM | POA: Diagnosis present

## 2020-08-14 DIAGNOSIS — A419 Sepsis, unspecified organism: Secondary | ICD-10-CM

## 2020-08-14 DIAGNOSIS — N12 Tubulo-interstitial nephritis, not specified as acute or chronic: Secondary | ICD-10-CM

## 2020-08-14 DIAGNOSIS — E1051 Type 1 diabetes mellitus with diabetic peripheral angiopathy without gangrene: Secondary | ICD-10-CM | POA: Diagnosis present

## 2020-08-14 DIAGNOSIS — E1022 Type 1 diabetes mellitus with diabetic chronic kidney disease: Secondary | ICD-10-CM

## 2020-08-14 DIAGNOSIS — R6510 Systemic inflammatory response syndrome (SIRS) of non-infectious origin without acute organ dysfunction: Secondary | ICD-10-CM | POA: Diagnosis not present

## 2020-08-14 DIAGNOSIS — H3530 Unspecified macular degeneration: Secondary | ICD-10-CM | POA: Diagnosis present

## 2020-08-14 DIAGNOSIS — Z794 Long term (current) use of insulin: Secondary | ICD-10-CM

## 2020-08-14 DIAGNOSIS — Z7982 Long term (current) use of aspirin: Secondary | ICD-10-CM

## 2020-08-14 DIAGNOSIS — Z881 Allergy status to other antibiotic agents status: Secondary | ICD-10-CM

## 2020-08-14 DIAGNOSIS — F32A Depression, unspecified: Secondary | ICD-10-CM | POA: Diagnosis present

## 2020-08-14 DIAGNOSIS — R509 Fever, unspecified: Secondary | ICD-10-CM

## 2020-08-14 DIAGNOSIS — Z888 Allergy status to other drugs, medicaments and biological substances status: Secondary | ICD-10-CM

## 2020-08-14 DIAGNOSIS — N2581 Secondary hyperparathyroidism of renal origin: Secondary | ICD-10-CM | POA: Diagnosis present

## 2020-08-14 DIAGNOSIS — E10649 Type 1 diabetes mellitus with hypoglycemia without coma: Secondary | ICD-10-CM | POA: Diagnosis present

## 2020-08-14 LAB — COMPREHENSIVE METABOLIC PANEL
ALT: 19 U/L (ref 0–44)
AST: 19 U/L (ref 15–41)
Albumin: 3.5 g/dL (ref 3.5–5.0)
Alkaline Phosphatase: 69 U/L (ref 38–126)
Anion gap: 14 (ref 5–15)
BUN: 15 mg/dL (ref 6–20)
CO2: 31 mmol/L (ref 22–32)
Calcium: 7.8 mg/dL — ABNORMAL LOW (ref 8.9–10.3)
Chloride: 91 mmol/L — ABNORMAL LOW (ref 98–111)
Creatinine, Ser: 3.31 mg/dL — ABNORMAL HIGH (ref 0.44–1.00)
GFR, Estimated: 16 mL/min — ABNORMAL LOW (ref >60)
Glucose, Bld: 125 mg/dL — ABNORMAL HIGH (ref 70–99)
Potassium: 3.5 mmol/L (ref 3.5–5.1)
Sodium: 136 mmol/L (ref 135–145)
Total Bilirubin: 0.7 mg/dL (ref 0.3–1.2)
Total Protein: 8 g/dL (ref 6.5–8.1)

## 2020-08-14 LAB — CBC WITH DIFFERENTIAL/PLATELET
Abs Immature Granulocytes: 0.07 10*3/uL (ref 0.00–0.07)
Basophils Absolute: 0 10*3/uL (ref 0.0–0.1)
Basophils Relative: 0 %
Eosinophils Absolute: 0 10*3/uL (ref 0.0–0.5)
Eosinophils Relative: 0 %
HCT: 32.9 % — ABNORMAL LOW (ref 36.0–46.0)
Hemoglobin: 10.5 g/dL — ABNORMAL LOW (ref 12.0–15.0)
Immature Granulocytes: 1 %
Lymphocytes Relative: 3 %
Lymphs Abs: 0.4 10*3/uL — ABNORMAL LOW (ref 0.7–4.0)
MCH: 31.1 pg (ref 26.0–34.0)
MCHC: 31.9 g/dL (ref 30.0–36.0)
MCV: 97.3 fL (ref 80.0–100.0)
Monocytes Absolute: 0.9 10*3/uL (ref 0.1–1.0)
Monocytes Relative: 6 %
Neutro Abs: 13.6 10*3/uL — ABNORMAL HIGH (ref 1.7–7.7)
Neutrophils Relative %: 90 %
Platelets: 329 10*3/uL (ref 150–400)
RBC: 3.38 MIL/uL — ABNORMAL LOW (ref 3.87–5.11)
RDW: 13.3 % (ref 11.5–15.5)
WBC: 15 10*3/uL — ABNORMAL HIGH (ref 4.0–10.5)
nRBC: 0 % (ref 0.0–0.2)

## 2020-08-14 LAB — SARS CORONAVIRUS 2 BY RT PCR (HOSPITAL ORDER, PERFORMED IN ~~LOC~~ HOSPITAL LAB): SARS Coronavirus 2: NEGATIVE

## 2020-08-14 LAB — HEMOGLOBIN A1C
Hgb A1c MFr Bld: 6.7 % — ABNORMAL HIGH (ref 4.8–5.6)
Mean Plasma Glucose: 145.59 mg/dL

## 2020-08-14 LAB — LACTIC ACID, PLASMA: Lactic Acid, Venous: 1.4 mmol/L (ref 0.5–1.9)

## 2020-08-14 LAB — POC SARS CORONAVIRUS 2 AG -  ED: SARS Coronavirus 2 Ag: NEGATIVE

## 2020-08-14 LAB — CBG MONITORING, ED: Glucose-Capillary: 165 mg/dL — ABNORMAL HIGH (ref 70–99)

## 2020-08-14 MED ORDER — INSULIN ASPART 100 UNIT/ML ~~LOC~~ SOLN
0.0000 [IU] | Freq: Three times a day (TID) | SUBCUTANEOUS | Status: DC
  Administered 2020-08-16: 3 [IU] via SUBCUTANEOUS
  Administered 2020-08-17: 2 [IU] via SUBCUTANEOUS
  Administered 2020-08-17: 5 [IU] via SUBCUTANEOUS

## 2020-08-14 MED ORDER — SODIUM CHLORIDE 0.9 % IV BOLUS (SEPSIS)
250.0000 mL | Freq: Once | INTRAVENOUS | Status: AC
  Administered 2020-08-14: 250 mL via INTRAVENOUS

## 2020-08-14 MED ORDER — DORZOLAMIDE HCL-TIMOLOL MAL 2-0.5 % OP SOLN
1.0000 [drp] | Freq: Two times a day (BID) | OPHTHALMIC | Status: DC
  Administered 2020-08-15 – 2020-08-17 (×5): 1 [drp] via OPHTHALMIC
  Filled 2020-08-14: qty 10

## 2020-08-14 MED ORDER — CLONAZEPAM 0.5 MG PO TABS
1.0000 mg | ORAL_TABLET | Freq: Every day | ORAL | Status: DC | PRN
Start: 2020-08-14 — End: 2020-08-17
  Administered 2020-08-17: 1 mg via ORAL
  Filled 2020-08-14: qty 2

## 2020-08-14 MED ORDER — ASPIRIN EC 81 MG PO TBEC
81.0000 mg | DELAYED_RELEASE_TABLET | Freq: Every day | ORAL | Status: DC
  Administered 2020-08-15 – 2020-08-17 (×3): 81 mg via ORAL
  Filled 2020-08-14 (×3): qty 1

## 2020-08-14 MED ORDER — ACETAMINOPHEN 325 MG PO TABS
325.0000 mg | ORAL_TABLET | Freq: Once | ORAL | Status: AC
  Administered 2020-08-14: 650 mg via ORAL
  Filled 2020-08-14: qty 1

## 2020-08-14 MED ORDER — TRAZODONE HCL 50 MG PO TABS
50.0000 mg | ORAL_TABLET | Freq: Every day | ORAL | Status: DC
  Administered 2020-08-14 – 2020-08-16 (×3): 50 mg via ORAL
  Filled 2020-08-14 (×3): qty 1

## 2020-08-14 MED ORDER — INSULIN ASPART 100 UNIT/ML ~~LOC~~ SOLN
3.0000 [IU] | Freq: Three times a day (TID) | SUBCUTANEOUS | Status: DC
  Administered 2020-08-16 – 2020-08-17 (×3): 3 [IU] via SUBCUTANEOUS

## 2020-08-14 MED ORDER — FERRIC CITRATE 1 GM 210 MG(FE) PO TABS: 420.0000 mg | ORAL_TABLET | ORAL | Status: DC

## 2020-08-14 MED ORDER — FERRIC CITRATE 1 GM 210 MG(FE) PO TABS
420.0000 mg | ORAL_TABLET | Freq: Three times a day (TID) | ORAL | Status: DC
  Administered 2020-08-15 – 2020-08-17 (×7): 420 mg via ORAL
  Filled 2020-08-14 (×10): qty 2

## 2020-08-14 MED ORDER — VITAMIN D 25 MCG (1000 UNIT) PO TABS
5000.0000 [IU] | ORAL_TABLET | Freq: Every day | ORAL | Status: DC
  Administered 2020-08-15 – 2020-08-17 (×3): 5000 [IU] via ORAL
  Filled 2020-08-14 (×3): qty 5

## 2020-08-14 MED ORDER — PIPERACILLIN-TAZOBACTAM IN DEX 2-0.25 GM/50ML IV SOLN
2.2500 g | Freq: Three times a day (TID) | INTRAVENOUS | Status: DC
  Administered 2020-08-14 – 2020-08-16 (×5): 2.25 g via INTRAVENOUS
  Filled 2020-08-14 (×6): qty 50

## 2020-08-14 MED ORDER — DULOXETINE HCL 60 MG PO CPEP
60.0000 mg | ORAL_CAPSULE | Freq: Every day | ORAL | Status: DC
  Administered 2020-08-15 – 2020-08-17 (×3): 60 mg via ORAL
  Filled 2020-08-14 (×3): qty 1

## 2020-08-14 MED ORDER — ONDANSETRON HCL 4 MG PO TABS: 4.0000 mg | ORAL_TABLET | Freq: Four times a day (QID) | ORAL | Status: DC | PRN

## 2020-08-14 MED ORDER — LINEZOLID 600 MG/300ML IV SOLN
600.0000 mg | Freq: Two times a day (BID) | INTRAVENOUS | Status: DC
  Administered 2020-08-14 – 2020-08-16 (×4): 600 mg via INTRAVENOUS
  Filled 2020-08-14 (×5): qty 300

## 2020-08-14 MED ORDER — LISINOPRIL 20 MG PO TABS: 20.0000 mg | ORAL_TABLET | Freq: Every day | ORAL | Status: DC

## 2020-08-14 MED ORDER — INSULIN GLARGINE 100 UNIT/ML ~~LOC~~ SOLN
40.0000 [IU] | Freq: Two times a day (BID) | SUBCUTANEOUS | Status: DC
  Administered 2020-08-14: 40 [IU] via SUBCUTANEOUS
  Filled 2020-08-14 (×3): qty 0.4

## 2020-08-14 MED ORDER — RENA-VITE PO TABS
1.0000 | ORAL_TABLET | Freq: Every day | ORAL | Status: DC
  Administered 2020-08-14 – 2020-08-16 (×3): 1 via ORAL
  Filled 2020-08-14 (×4): qty 1

## 2020-08-14 MED ORDER — CYCLOSPORINE 0.05 % OP EMUL
1.0000 [drp] | Freq: Two times a day (BID) | OPHTHALMIC | Status: DC | PRN
  Filled 2020-08-14: qty 1

## 2020-08-14 MED ORDER — FERRIC CITRATE 1 GM 210 MG(FE) PO TABS
210.0000 mg | ORAL_TABLET | ORAL | Status: DC
  Administered 2020-08-16 (×3): 210 mg via ORAL
  Filled 2020-08-14 (×10): qty 1

## 2020-08-14 MED ORDER — ONDANSETRON HCL 4 MG/2ML IJ SOLN: 4.0000 mg | Freq: Four times a day (QID) | INTRAMUSCULAR | Status: DC | PRN

## 2020-08-14 MED ORDER — ROSUVASTATIN CALCIUM 20 MG PO TABS
40.0000 mg | ORAL_TABLET | Freq: Every day | ORAL | Status: DC
  Administered 2020-08-15 – 2020-08-16 (×2): 40 mg via ORAL
  Filled 2020-08-14: qty 8
  Filled 2020-08-14: qty 2

## 2020-08-14 MED ORDER — ENOXAPARIN SODIUM 30 MG/0.3ML ~~LOC~~ SOLN
30.0000 mg | SUBCUTANEOUS | Status: DC
  Administered 2020-08-14 – 2020-08-16 (×3): 30 mg via SUBCUTANEOUS
  Filled 2020-08-14 (×3): qty 0.3

## 2020-08-14 MED ORDER — LEVOTHYROXINE SODIUM 75 MCG PO TABS
150.0000 ug | ORAL_TABLET | Freq: Every day | ORAL | Status: DC
  Administered 2020-08-15 – 2020-08-17 (×3): 150 ug via ORAL
  Filled 2020-08-14 (×3): qty 2

## 2020-08-14 MED ORDER — ACETAMINOPHEN 325 MG PO TABS: 650.0000 mg | ORAL_TABLET | Freq: Four times a day (QID) | ORAL | Status: DC | PRN

## 2020-08-14 MED ORDER — ACETAMINOPHEN 650 MG RE SUPP: 650.0000 mg | Freq: Four times a day (QID) | RECTAL | Status: DC | PRN

## 2020-08-14 NOTE — H&P (Signed)
History and Physical    Suzanne Allen [id number] DOB: Nov 20, 1960 DOA: 08/14/2020  PCP: Mindi Curling, PA-C  Patient coming from: Home  I have personally briefly reviewed patient's old medical records in [place]  Chief Complaint: Fever  HPI: Suzanne Allen is a 60 y.o. female with medical history significant of DM1, ESRD.  Pt presents to ED with c/o fever.  Pt woke up this AM with chills.  Symptoms worsened throughout the day, developed back pain.  Back pain is B, constant, severe.  No radiation.  Symptoms worsened while at dialysis.  No numbness, weakness.  Has had nausea and some vomiting.  No diarrhea, cough, SOB, sore throat.  Still makes urine up to TID, no dysuria.   ED Course: COVID negative.  WBC 15k.  Tm 102.8.  CXR just shows atelectasis.  UA pending.   Review of Systems: As per HPI, otherwise all review of systems negative.  Past Medical History:  Diagnosis Date  . 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 Anemia   . Anxiety   . CAP (community acquired pneumonia) 03/13/2015  . Carpal tunnel syndrome 11/28/2014   Bilateral  . Chronic pancreatitis ([place])   . Depression   . Diabetes mellitus type I ([place]) dx'd 1977  . Diabetic osteomyelitis ([place])    right great toe/progress notes 09/23/2012  . Diabetic retinopathy ([place])   . ESRD (end stage renal disease) ([place])    ESRD- Hemo MWF- [facility]  . Fatty liver 04/04/2014  . HTN (hypertension)   . Hypercholesteremia   . Hypothyroidism   . Macular degeneration, bilateral   . Migraines 1980's   "when I was in my 20's" (03/14/2015)  . Peripheral neuropathy   . Pneumonia 1993  . PTSD (post-traumatic stress disorder)   . Stroke [facility]) ? date & 09/22/2012   TIA's - no resuidual  . TIA (transient ischemic attack) 09/22/2012   [name] 10/25/2012    Past Surgical History:  Procedure Laterality Date  . A/V FISTULAGRAM N/A 08/31/2018   Procedure: A/V FISTULAGRAM - Left arm;  Surgeon: Marty Heck, MD;  Location: [place] [facility] [facility];  Service: Cardiovascular;  Laterality: N/A;  . A/V FISTULAGRAM N/A 11/04/2018   Procedure: A/V FISTULAGRAM - Left Arm;  Surgeon: Marty Heck, MD;  Location: [facility];  Service: Cardiovascular;  Laterality: N/A;  . AMPUTATION Right 07/12/2015   Procedure: Right Transmetatarsal amputation;  Surgeon: Newt Minion, MD;  Location: [facility];  Service: Orthopedics;  Laterality: Right;  . BASCILIC VEIN TRANSPOSITION Left 12/31/2017   Procedure: FIRST STAGE BASILIC VEIN TRANSPOSITION LEFT ARM;  Surgeon: Conrad [place] Creek, MD;  Location: [facility];  Service: Vascular;  Laterality: Left;  . BASCILIC VEIN TRANSPOSITION Left 02/21/2018   Procedure: SECOND STAGE BASILIC VEIN TRANSPOSITION, LEFT UPPER ARM;  Surgeon: Marty Heck, MD;  Location: [facility];  Service: Vascular;  Laterality: Left;  . CATARACT EXTRACTION, BILATERAL    . [place]; 1995  . EYE SURGERY Bilateral    "hemorrhaged behind my eye"  . I & D EXTREMITY Right multiple   "big toe"  . INCISION AND DRAINAGE ABSCESS Left 12/08/2018   Procedure: Incision And Drainage of Left Axillary Abscess;  Surgeon: Marty Heck, MD;  Location: [facility] [facility];  Service: Vascular;  Laterality: Left;  . PERIPHERAL VASCULAR INTERVENTION Left 08/31/2018   Procedure: PERIPHERAL VASCULAR INTERVENTION;  Surgeon: Marty Heck, MD;  Location: [facility];  Service: Cardiovascular;  Laterality: Left;  . PERIPHERAL VASCULAR INTERVENTION Left 11/04/2018   Procedure: PERIPHERAL VASCULAR INTERVENTION;  Surgeon: Marty Heck, MD;  Location: [facility];  Service: Cardiovascular;  Laterality: Left;  arm fistula  . PERIPHERALLY INSERTED CENTRAL CATHETER INSERTION Right 08/31/2012  . PICC LINE REMOVAL ([place] [facility]) Right 10/12/2012   [name] 10/12/2012  . REFRACTIVE SURGERY Bilateral 2013  . TOE AMPUTATION Right 11/2012   "big toe"  . TONSILLECTOMY  1965  . TOTAL THYROIDECTOMY  2012   . TUBAL LIGATION  1995     reports that she has never smoked. She has never used smokeless tobacco. She reports that she does not drink alcohol and does not use drugs.  Allergies  Allergen Reactions  . Erythromycin Anaphylaxis  . Isosorbide   . Norvasc [Amlodipine] Shortness Of Breath and Swelling  . Vancomycin Shortness Of Breath and Other (See Comments)    Red Man Syndrome  . Sulfa Antibiotics Other (See Comments)  . Nsaids Other (See Comments)    Avoid due to CKD  . Wellbutrin [Bupropion] Other (See Comments)    Extreme neurological effects     Family History  Problem Relation Age of Onset  . Depression Mother   . Diabetes Mother   . Alcohol abuse Father   . Alcohol abuse Sister   . Diabetes Maternal Grandmother      Prior to Admission medications   Medication Sig Start Date End Date Taking? Authorizing Provider  Ascorbic Acid (VITAMIN C GUMMIE PO) Take 2 tablets by mouth daily.   Yes [provider]  aspirin EC 81 MG tablet Take 81 mg by mouth daily. Swallow whole.   Yes [provider]  Cholecalciferol (VITAMIN D-3) 125 MCG (5000 UT) TABS Take 5,000 Units by mouth daily.   Yes [provider]  clonazePAM (KLONOPIN) 1 MG tablet Take one tab at night before dialysis day and 2nd as needed for severe anxiety. 05/15/20  Yes Arfeen, Arlyce Harman, MD  dorzolamide-timolol (COSOPT) 22.3-6.8 MG/ML ophthalmic solution Place 1 drop into both eyes 2 (two) times daily.    Yes [provider]  DULoxetine (CYMBALTA) 60 MG capsule Take 1 capsule (60 mg total) by mouth daily. 05/15/20  Yes Arfeen, Arlyce Harman, MD  ferric citrate (AURYXIA) 1 GM 210 MG(Fe) tablet Take 420 mg by mouth See admin instructions. 2-3  tabs with meals, 1 tab with snacks   Yes [provider]  glucose blood test strip one strip (1 each total) by Other route 4 (four) times daily. Use as directed. Pharmacy, please dispense this brand of blood glucose test strips: Bayer Contour Next  03/09/17  Yes [provider]  insulin glargine (LANTUS) 100 UNIT/ML injection Inject 40 Units into the skin 2 (two) times daily.   Yes [provider]  insulin lispro (HUMALOG) 100 UNIT/ML injection Inject 20 Units into the skin See admin instructions. Inject up to 20 units subcutaneously three times daily before meals if needed Per sliding scale as directed   Yes [provider]  levothyroxine (SYNTHROID, LEVOTHROID) 150 MCG tablet Take 150 mcg by mouth daily before breakfast.   Yes [provider]  lidocaine-prilocaine (EMLA) cream APPLY SMALL AMOUNT TO ACCESS SITE (AVF) 3 TIMES A WEEK 1 HOUR BEFORE DIALYSIS. COVER WITH OCCLUSIVE DRESSING (SARAN WRAP) 11/11/18  Yes [provider]  lisinopril (PRINIVIL,ZESTRIL) 20 MG tablet Take 20 mg by mouth at bedtime.  10/24/17  Yes [provider]  multivitamin (RENA-VIT) TABS tablet  Take 1 tablet by mouth at bedtime.    Yes [provider]  ondansetron (ZOFRAN ODT) 4 MG disintegrating tablet Take 1 tablet (4 mg total) by mouth every 8 (eight) hours as needed for nausea or vomiting. 12/02/19  Yes Sherwood Gambler, MD  RESTASIS 0.05 % ophthalmic emulsion Place 1 drop into both eyes 2 (two) times daily as needed (for dry/irritated eyes).  12/29/17  Yes [provider]  rosuvastatin (CRESTOR) 40 MG tablet Take 40 mg by mouth daily.   Yes [provider]  traZODone (DESYREL) 50 MG tablet Take 50 mg by mouth at bedtime.   Yes [provider]    Physical Exam: Vitals:   08/14/20 2100 08/14/20 2109 08/14/20 2200 08/14/20 2237  BP: 132/64 132/64 135/66 (!) 142/72  Pulse: 95 (!) 103 92 90  Resp: (!) '23 18 17 18  '$ Temp:      TempSrc:      SpO2: 91% 91% 92% 99%  Weight:      Height:        Constitutional: NAD, calm, comfortable Eyes: PERRL, lids and conjunctivae normal ENMT: Mucous membranes are moist. Posterior pharynx clear of any exudate or lesions.Normal dentition.  Neck:  normal, supple, no masses, no thyromegaly Respiratory: clear to auscultation bilaterally, no wheezing, no crackles. Normal respiratory effort. No accessory muscle use.  Cardiovascular: Regular rate and rhythm, no murmurs / rubs / gallops. No extremity edema. 2+ pedal pulses. No carotid bruits.  Abdomen: no tenderness, no masses palpated. No hepatosplenomegaly. Bowel sounds positive.  Musculoskeletal: no clubbing / cyanosis. No joint deformity upper and lower extremities. Good ROM, no contractures. Normal muscle tone.  Skin: no rashes, lesions, ulcers. No induration Neurologic: CN 2-12 grossly intact. Sensation intact, DTR normal. Strength 5/5 in all 4.  Psychiatric: Normal judgment and insight. Alert and oriented x 3. Normal mood.    Labs on Admission: I have personally reviewed following labs and imaging studies  CBC: Recent Labs  Lab 08/14/20 1648  WBC 15.0*  NEUTROABS 13.6*  HGB 10.5*  HCT 32.9*  MCV 97.3  PLT [id number]   Basic Metabolic Panel: Recent Labs  Lab 08/14/20 1648  NA 136  K 3.5  CL 91*  CO2 31  GLUCOSE 125*  BUN 15  CREATININE 3.31*  CALCIUM 7.8*   GFR: Estimated Creatinine Clearance: 17.1 mL/min (A) (by C-G formula based on SCr of 3.31 mg/dL (H)). Liver Function Tests: Recent Labs  Lab 08/14/20 1648  AST 19  ALT 19  ALKPHOS 69  BILITOT 0.7  PROT 8.0  ALBUMIN 3.5   No results for input(s): LIPASE, AMYLASE in the last 168 hours. No results for input(s): AMMONIA in the last 168 hours. Coagulation Profile: No results for input(s): INR, PROTIME in the last 168 hours. Cardiac Enzymes: No results for input(s): CKTOTAL, CKMB, CKMBINDEX, TROPONINI in the last 168 hours. BNP (last 3 results) No results for input(s): PROBNP in the last 8760 hours. HbA1C: No results for input(s): HGBA1C in the last 72 hours. CBG: No results for input(s): GLUCAP in the last 168 hours. Lipid Profile: No results for input(s): CHOL, HDL, LDLCALC, TRIG, CHOLHDL, LDLDIRECT in the  last 72 hours. Thyroid Function Tests: No results for input(s): TSH, T4TOTAL, FREET4, T3FREE, THYROIDAB in the last 72 hours. Anemia Panel: No results for input(s): VITAMINB12, FOLATE, FERRITIN, TIBC, IRON, RETICCTPCT in the last 72 hours. Urine analysis:    Component Value Date/Time   COLORURINE YELLOW 12/02/2019 1143   APPEARANCEUR HAZY (A) 12/02/2019 1143  LABSPEC 1.018 12/02/2019 1143   PHURINE 7.0 12/02/2019 1143   GLUCOSEU >=500 (A) 12/02/2019 1143   HGBUR NEGATIVE 12/02/2019 1143   [place] 12/02/2019 1143   KETONESUR NEGATIVE 12/02/2019 1143   PROTEINUR >=300 (A) 12/02/2019 1143   UROBILINOGEN 0.2 10/04/2013 0917   NITRITE NEGATIVE 12/02/2019 1143   LEUKOCYTESUR NEGATIVE 12/02/2019 1143    Radiological Exams on Admission: DG Chest Portable 1 View  Result Date: 08/14/2020 CLINICAL DATA:  Fever. EXAM: PORTABLE CHEST 1 VIEW COMPARISON:  08/31/2018 FINDINGS: Normal heart size and mediastinal contours. Subsegmental atelectasis at the left lung base. No confluent consolidation. No pulmonary edema, pleural effusion or pneumothorax. Remote left rib fracture. No acute osseous abnormalities are seen. IMPRESSION: Subsegmental atelectasis at the left lung base. No focal consolidation. Electronically Signed   By: Keith Rake M.D.   On: 08/14/2020 19:55    EKG: Independently reviewed.  Assessment/Plan Principal Problem:   SIRS (systemic inflammatory response syndrome) (HCC) Active Problems:   Diabetes type 1, uncontrolled (HCC)   Benign essential HTN   ESRD on dialysis ([place])    1. SIRS - 1. Suspect urinary source but need UTI to confirm 2. Will also get renal US to r/o obstructing stone 3. Spine source is possible but felt less likely given lack of neurogenic symptoms, lack of radiation.  Would get MRI spine if urinary tract work up is negative. 4. Got empiric zosyn / lzd in ED 5. Checking MRSA PCR nares 6. Cont zosyn for the moment 7. Repeat labs in  AM 8. Cultures pending 9. UA pending 2. DM1 - 1. Cont Lantus 40 BID 2. Cont novolog TID AC and mod scale SSI 3. HTN - 1. Hold lisinopril for the moment 4. ESRD - 1. Got dialysis earlier today 2. Call nephro in AM if pt not discharged tomorrow for IP dialysis  DVT prophylaxis: Lovenox Code Status: Full Family Communication: No family in room Disposition Plan: Home after SIRS improved Consults called: None Admission status: Place in 45    Angell Pincock, [facility]  How to contact the [facility] Attending or Consulting provider [place] or covering provider during after hours [place], for this patient?  1. Check the care team in [facility] and look for a) attending/consulting [facility] provider listed and b) the [facility] team listed 2. Log into [email or web address]  Amion Physician Scheduling and messaging for groups and whole hospitals  On call and physician scheduling software for group practices, residents, hospitalists and other medical providers for call, clinic, rotation and shift schedules. On[employer] is a hospital-wide system for scheduling doctors and paging doctors on call. EasyPlot is for scientific plotting and data analysis.  [email or web address]  and use [facility]'s universal password to access. If you do not have the password, please contact the hospital operator.  3. Locate the [facility] provider you are looking for under [facility] and page to a number that you can be directly reached. 4. If you still have difficulty reaching the provider, please page the [facility] (Director on Call) for the Hospitalists listed on amion for assistance.  08/14/2020, 11:00 PM

## 2020-08-14 NOTE — ED Notes (Signed)
Patient transported to Ultrasound 

## 2020-08-14 NOTE — ED Provider Notes (Signed)
[facility] [facility] EMERGENCY DEPARTMENT Provider Note   CSN: [id number] Arrival date & time: 08/14/20  1623     History No chief complaint on file.   Suzanne Allen is a 60 y.o. female.  HPI      60yo female with history of DM, ESRD on dialysis MWF, hypertension, hyperlipidemia, CVA, PTSD, osteomyelitis and amputation right toe, who presents with concern for fever.   Woke up this AM with chills Chills and back pain increased throughout the day, worsening while sitting at dialysis Bilateral lower back, severe pain No numbness/weakness/loss of bowel control Nausea and some vomiting No diarrhea, cough, shortness of breath, congestion, sore throat Still makes urine, no dysuria Dialysis, some people have tested positive for COVID 19 Has had vaccine and booster No hx of spine infections, no hx of IVDU   Past Medical History:  Diagnosis Date  . 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 Anemia   . Anxiety   . CAP (community acquired pneumonia) 03/13/2015  . Carpal tunnel syndrome 11/28/2014   Bilateral  . Chronic pancreatitis ([place])   . Depression   . Diabetes mellitus type I ([place]) dx'd 1977  . Diabetic osteomyelitis ([place])    right great toe/progress notes 09/23/2012  . Diabetic retinopathy ([place])   . ESRD (end stage renal disease) ([place])    ESRD- Hemo MWF- [facility]  . Fatty liver 04/04/2014  . HTN (hypertension)   . Hypercholesteremia   . Hypothyroidism   . Macular degeneration, bilateral   . Migraines 1980's   "when I was in my 20's" (03/14/2015)  . Peripheral neuropathy   . Pneumonia 1993  . PTSD (post-traumatic stress disorder)   . Stroke [facility]) ? date & 09/22/2012   TIA's - no resuidual  . TIA (transient ischemic attack) 09/22/2012   [name] 10/25/2012    Patient Active Problem List   Diagnosis Date Noted  . ESRD on dialysis ([place]) 11/01/2018  . Ulcer of right heel, limited to breakdown of skin ([place]) 08/15/2018  . Amputation of toe of right  foot ([place]) 08/10/2018  . CKD (chronic kidney disease) stage 5, GFR less than 15 ml/min (HCC) 12/22/2017  . Chest pain 09/20/2017  . Diabetic nephropathy associated with type 1 diabetes mellitus ([place]) 03/03/2017  . Cellulitis of left foot 07/23/2016  . Status post transmetatarsal amputation of foot, right ([place]) 07/17/2016  . Major depressive disorder, recurrent episode, mild ([place]) 05/04/2016  . Osteomyelitis ([place]) 07/08/2015  . Poorly controlled diabetes mellitus ([place]) 07/08/2015  . Depression with anxiety 07/08/2015  . Lactic acid acidosis 07/08/2015  . BP (high blood pressure) 06/17/2015  . 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 Hypothyroidism, postop 06/17/2015  . 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 Proliferative diabetic retinopathy ([place]) 06/17/2015  . Dysesthesia 06/17/2015  . CAP (community acquired pneumonia) 03/13/2015  . Dyspnea 03/13/2015  . Respiratory failure with hypoxia ([place]) 03/13/2015  . Pericardial effusion 03/13/2015  . Depression 02/15/2015  . Carpal tunnel syndrome 11/28/2014  . Diabetes, polyneuropathy ([place]) 11/15/2014  . Numbness 11/15/2014  . Gait disturbance 11/15/2014  . Hand weakness 11/15/2014  . Insomnia 11/15/2014  . DDD (degenerative disc disease), lumbar 04/10/2014  . Diarrhea 04/05/2014  . Dyslipidemia 04/04/2014  . Fatty liver 04/04/2014  . Hypokalemia 04/04/2014  . Elevated LFTs 04/04/2014  . Benign essential HTN 04/02/2014  . Hypothyroidism 04/02/2014  . Diabetes ([place]) 10/10/2013  . Pancreatitis, acute 10/10/2013  . Leukocytosis 10/04/2013  . Acute pancreatitis 10/04/2013  . Diabetic  neuropathy ([place]) 10/03/2013  . TIA (transient ischemic attack) 09/23/2012  . Temporary cerebral vascular dysfunction 09/23/2012  . Diabetic osteomyelitis ([place]) 08/29/2012  . Diabetes type 1, uncontrolled ([place]) 08/29/2012  . Type 1 DM with CKD and hypertension ([place]) 08/29/2012  . Type 2 diabetes mellitus ([place]) 08/29/2012  . Diabetic  foot ulcer ([place]) 07/27/2012  . Anxiety 08/24/2011  . Arthralgia of multiple joints 10/01/2010  . HLD (hyperlipidemia) 04/25/2010  . Avitaminosis D 04/25/2010  . Allergic rhinitis 08/16/2008    Past Surgical History:  Procedure Laterality Date  . A/V FISTULAGRAM N/A 08/31/2018   Procedure: A/V FISTULAGRAM - Left arm;  Surgeon: Marty Heck, MD;  Location: [facility] CV LAB;  Service: Cardiovascular;  Laterality: N/A;  . A/V FISTULAGRAM N/A 11/04/2018   Procedure: A/V FISTULAGRAM - Left Arm;  Surgeon: Marty Heck, MD;  Location: [place] [facility];  Service: Cardiovascular;  Laterality: N/A;  . AMPUTATION Right 07/12/2015   Procedure: Right Transmetatarsal amputation;  Surgeon: Newt Minion, MD;  Location: [facility];  Service: Orthopedics;  Laterality: Right;  . BASCILIC VEIN TRANSPOSITION Left 12/31/2017   Procedure: FIRST STAGE BASILIC VEIN TRANSPOSITION LEFT ARM;  Surgeon: Conrad , MD;  Location: [facility];  Service: Vascular;  Laterality: Left;  . BASCILIC VEIN TRANSPOSITION Left 02/21/2018   Procedure: SECOND STAGE BASILIC VEIN TRANSPOSITION, LEFT UPPER ARM;  Surgeon: Marty Heck, MD;  Location: [facility];  Service: Vascular;  Laterality: Left;  . CATARACT EXTRACTION, BILATERAL    . [place]; 1995  . EYE SURGERY Bilateral    "hemorrhaged behind my eye"  . I & D EXTREMITY Right multiple   "big toe"  . INCISION AND DRAINAGE ABSCESS Left 12/08/2018   Procedure: Incision And Drainage of Left Axillary Abscess;  Surgeon: Marty Heck, MD;  Location: [facility];  Service: Vascular;  Laterality: Left;  . PERIPHERAL VASCULAR INTERVENTION Left 08/31/2018   Procedure: PERIPHERAL VASCULAR INTERVENTION;  Surgeon: Marty Heck, MD;  Location: [facility] CV LAB;  Service: Cardiovascular;  Laterality: Left;  . PERIPHERAL VASCULAR INTERVENTION Left 11/04/2018   Procedure: PERIPHERAL VASCULAR INTERVENTION;  Surgeon: Marty Heck, MD;  Location: [place] CV LAB;  Service: Cardiovascular;  Laterality: Left;  arm fistula  . PERIPHERALLY INSERTED CENTRAL CATHETER INSERTION Right 08/31/2012  . PICC LINE REMOVAL ([place] HX) Right 10/12/2012   [name] 10/12/2012  . REFRACTIVE SURGERY Bilateral 2013  . TOE AMPUTATION Right 11/2012   "big toe"  . TONSILLECTOMY  1965  . TOTAL THYROIDECTOMY  2012  . TUBAL LIGATION  1995     OB History   No obstetric history on file.     Family History  Problem Relation Age of Onset  . Depression Mother   . Diabetes Mother   . Alcohol abuse Father   . Alcohol abuse Sister   . Diabetes Maternal Grandmother     Social History   Tobacco Use  . Smoking status: Never Smoker  . Smokeless tobacco: Never Used  Vaping Use  . Vaping Use: Never used  Substance Use Topics  . Alcohol use: No    Alcohol/week: 0.0 standard drinks  . Drug use: No    Home Medications Prior to Admission medications   Medication Sig Start Date End Date Taking? Authorizing Provider  Ascorbic Acid (VITAMIN C GUMMIE PO) Take 2 tablets by mouth daily.    [provider]  Cholecalciferol (VITAMIN D-3) 125 MCG (5000 UT) TABS Take 10,000 Units  by mouth daily.    [provider]  clonazePAM (KLONOPIN) 1 MG tablet Take one tab at night before dialysis day and 2nd as needed for severe anxiety. 05/15/20   Arfeen, Arlyce Harman, MD  dorzolamide-timolol (COSOPT) 22.3-6.8 MG/ML ophthalmic solution Place 1 drop into both eyes 2 (two) times daily.     [provider]  DULoxetine (CYMBALTA) 60 MG capsule Take 1 capsule (60 mg total) by mouth daily. 05/15/20   Arfeen, Arlyce Harman, MD  glucose blood (CONTOUR NEXT TEST) test strip one strip (1 each total) by Other route 4 (four) times daily. Use as directed. Pharmacy, please dispense this brand of blood glucose test strips: Bayer Contour Next 03/09/17   [provider]  insulin glargine (LANTUS) 100 UNIT/ML injection Inject 40 Units into the skin 2 (two) times daily.    [provider]  insulin lispro (HUMALOG) 100 UNIT/ML injection Inject 20 Units into the skin See admin instructions. Inject up to 20 units subcutaneously three times daily before meals if needed Per sliding scale as directed    [provider]  levothyroxine (SYNTHROID, LEVOTHROID) 150 MCG tablet Take 150 mcg by mouth daily before breakfast.    [provider]  lidocaine-prilocaine (EMLA) cream APPLY SMALL AMOUNT TO ACCESS SITE (AVF) 3 TIMES A WEEK 1 HOUR BEFORE DIALYSIS. COVER WITH OCCLUSIVE DRESSING ([place]AR[place]AP) 11/11/18   [provider]  lisinopril (PRINIVIL,ZESTRIL) 20 MG tablet Take 20 mg by mouth at bedtime.  10/24/17   [provider]  multivitamin (RENA-VIT) TABS tablet Take 1 tablet by mouth at bedtime.     [provider]  ondansetron (ZOFRAN ODT) 4 MG disintegrating tablet Take 1 tablet (4 mg total) by mouth every 8 (eight) hours as needed for nausea or vomiting. 12/02/19   Sherwood Gambler, MD  RESTASIS 0.05 % ophthalmic emulsion Place 1 drop into both eyes 2 (two) times daily as needed (for dry/irritated eyes).  12/29/17   [provider]  rosuvastatin (CRESTOR) 40 MG tablet Take 40 mg by mouth daily.    [provider]    Allergies    Erythromycin, Norvasc [amlodipine], Vancomycin, Nsaids, and Wellbutrin [bupropion]  Review of Systems   Review of Systems  Constitutional: Positive for appetite change, fatigue and fever.  HENT: Negative for congestion and sore throat.   Eyes: Negative for visual disturbance.  Respiratory: Negative for cough and shortness of breath.   Cardiovascular: Negative for chest pain.  Gastrointestinal: Positive for nausea and vomiting. Negative for abdominal pain, constipation and diarrhea.  Genitourinary: Negative for difficulty urinating and dysuria.  Musculoskeletal: Positive for back pain. Negative for neck pain.  Skin: Negative for rash.  Neurological: Negative for syncope, weakness, numbness  and headaches.    Physical Exam Updated Vital Signs BP 135/66   Pulse 92   Temp (!) 100.8 F (38.2 C) (Oral)   Resp 17   Ht '5\' 3"'$  (1.6 m)   Wt 67.1 kg   SpO2 92%   BMI 26.22 kg/m   Physical Exam Vitals and nursing note reviewed.  Constitutional:      General: She is not in acute distress.    Appearance: She is well-developed and well-nourished. She is ill-appearing. She is not diaphoretic.  HENT:     Head: Normocephalic and atraumatic.  Eyes:     Extraocular Movements: EOM normal.     Conjunctiva/sclera: Conjunctivae normal.  Cardiovascular:     Rate and Rhythm: Normal rate and regular rhythm.  Pulses: Intact distal pulses.     Heart sounds: Normal heart sounds. No murmur heard. No friction rub. No gallop.   Pulmonary:     Effort: Pulmonary effort is normal. No respiratory distress.     Breath sounds: Normal breath sounds. No wheezing or rales.  Abdominal:     General: There is no distension.     Palpations: Abdomen is soft.     Tenderness: There is no abdominal tenderness. There is no guarding.  Musculoskeletal:        General: Tenderness (low lumbar spine and bilateral back) present. No edema.     Cervical back: Normal range of motion.  Skin:    General: Skin is warm and dry.     Findings: No erythema or rash.  Neurological:     Mental Status: She is alert and oriented to person, place, and time.     Comments: Normal strength and sensation lower extremities     ED Results / Procedures / Treatments   Labs (all labs ordered are listed, but only abnormal results are displayed) Labs Reviewed  COMPREHENSIVE METABOLIC PANEL - Abnormal; Notable for the following components:      Result Value   Chloride 91 (*)    Glucose, Bld 125 (*)    Creatinine, Ser 3.31 (*)    Calcium 7.8 (*)    GFR, Estimated 16 (*)    All other components within normal limits  CBC WITH DIFFERENTIAL/PLATELET - Abnormal; Notable for the following components:   WBC 15.0 (*)    RBC 3.38  (*)    Hemoglobin 10.5 (*)    HCT 32.9 (*)    Neutro Abs 13.6 (*)    Lymphs Abs 0.4 (*)    All other components within normal limits  SARS CORONAVIRUS 2 BY RT PCR (HOSPITAL ORDER, [place] [facility])  CULTURE, BLOOD (ROUTINE X 2)  CULTURE, BLOOD (ROUTINE X 2)  URINE CULTURE  LACTIC ACID, PLASMA  LACTIC ACID, PLASMA  URINALYSIS, ROUTINE W REFLEX MICROSCOPIC  POC SARS CORONAVIRUS 2 AG -  ED    EKG None  Radiology DG Chest Portable 1 View  Result Date: 08/14/2020 CLINICAL DATA:  Fever. EXAM: PORTABLE CHEST 1 VIEW COMPARISON:  08/31/2018 FINDINGS: Normal heart size and mediastinal contours. Subsegmental atelectasis at the left lung base. No confluent consolidation. No pulmonary edema, pleural effusion or pneumothorax. Remote left rib fracture. No acute osseous abnormalities are seen. IMPRESSION: Subsegmental atelectasis at the left lung base. No focal consolidation. Electronically Signed   By: Keith Rake M.D.   On: 08/14/2020 19:55    Procedures Procedures   Medications Ordered in ED Medications  piperacillin-tazobactam (ZOSYN) IVPB 2.25 g (2.25 g Intravenous New Bag/Given 08/14/20 2153)  linezolid (ZYVOX) IVPB 600 mg (has no administration in time range)  acetaminophen (TYLENOL) tablet 325 mg (650 mg Oral Given 08/14/20 1649)    ED Course  I have reviewed the triage vital signs and the nursing notes.  Pertinent labs & imaging results that were available during my care of the patient were reviewed by me and considered in my medical decision making (see chart for details).  Clinical Course as of 08/14/20 2214  Wed Aug 14, 2020  1921 Potassium: 3.5 [JS]    Clinical Course User Index [JS] Janeece Fitting, PA-C   MDM Rules/Calculators/A&P  60yo female with history of DM, ESRD on dialysis MWF, hypertension, hyperlipidemia, CVA, PTSD, osteomyelitis and ambuation right toe, who presents with concern for fever, nausea, vomiting and  back pain.  DDx includes bacterial infections such as pneumonia, pyelonephritis, epidural abscess/ostemyelitis, bacteremia, and viral infections such as COVID 60.  Blood cx sent. Urinalysis and urine culture ordered--she does report she makes urine a few times/day.  CXR shows no sign of pneumonia. Ordered empiric zosyn as we await further testing to begin empiric coverage.  Labs significant for leukocytosis, normal lactic acid.    COVID testing negative. Given she has infrequent urination she has yet to urinate or feel urge and UA pending. If UA shows UTI suspect this as source, however if negative feel further evaluation of back pain/fever may be considered in high risk patient MRI lumbar pending clinical course.  She does report she will require anxiolysis (near sedation) in order to tolerate the test. Plan on admission given high fever in dialysis patient with signs of sepsis fever/leukocytosis/developing tachycardia with unclear source at this time. Ordered linezolid for empiric coverage given hx of vancomycin allergy.  Admitted for continued care.    Final Clinical Impression(s) / ED Diagnoses Final diagnoses:  Sepsis, due to unspecified organism, unspecified whether acute organ dysfunction present ([place])  Fever, unspecified fever cause  Leukocytosis, unspecified type    Rx / DC Orders ED Discharge Orders    None       Gareth Morgan, MD 08/14/20 2250

## 2020-08-14 NOTE — ED Triage Notes (Addendum)
Patient arrived by [facility] from dialysis center due to patient developing chills and having bilateral flank pain, low grade fever with same. Patient received 1 '325mg'$  tylenol prior to leaving dialysis for ED

## 2020-08-14 NOTE — Progress Notes (Signed)
Pharmacy Antibiotic Note  Suzanne Allen is a 60 y.o. female admitted on 08/14/2020 with sepsis. She arrived from the dialysis center after developing chills, bilateral flank pain, and low grade fever.  WBC 15, Tmx 100.8.  Regular schedule HD is MWF.  Pharmacy has been consulted for Zosyn dosing.  Plan: Zosyn 2.25gm IV q8hours  F/u culture data, clinical improvement  Height: '5\' 3"'$  (160 cm) Weight: 67.1 kg (148 lb) IBW/kg (Calculated) : 52.4  Temp (24hrs), Avg:101.8 F (38.8 C), Min:100.8 F (38.2 C), Max:102.8 F (39.3 C)  Recent Labs  Lab 08/14/20 1646 08/14/20 1648  WBC  --  15.0*  CREATININE  --  3.31*  LATICACIDVEN 1.4  --     Estimated Creatinine Clearance: 17.1 mL/min (A) (by C-G formula based on SCr of 3.31 mg/dL (H)).    Allergies  Allergen Reactions  . Erythromycin Anaphylaxis  . Norvasc [Amlodipine] Shortness Of Breath and Swelling  . Vancomycin Shortness Of Breath and Other (See Comments)    Red Man Syndrome Has patient had a PCN reaction causing immediate rash, facial/tongue/throat swelling, SOB or lightheadedness with hypotension: unk Has patient had a PCN reaction causing severe rash involving mucus membranes or skin necrosis: unk Has patient had a PCN reaction that required hospitalization: unk Has patient had a PCN reaction occurring within the last 10 years: unk If all of the above answers are "NO", then may proceed with Cephalosporin use.    . Nsaids Other (See Comments)    Avoid due to CKD  . Wellbutrin [Bupropion] Other (See Comments)    Extreme neurological effects     Antimicrobials this admission: Zosyn 2/2 >>   Microbiology results: 2/2 BCx: pending 2/2 UCx: pending   Thank you for allowing pharmacy to be a part of this patient's care.  Dimple Nanas, PharmD PGY-1 Acute Care Pharmacy Resident Office: [phone] 08/14/2020 8:18 PM

## 2020-08-15 ENCOUNTER — Telehealth (INDEPENDENT_AMBULATORY_CARE_PROVIDER_SITE_OTHER): Payer: Medicare Other | Admitting: Psychiatry

## 2020-08-15 ENCOUNTER — Observation Stay (HOSPITAL_COMMUNITY): Payer: Medicare Other

## 2020-08-15 ENCOUNTER — Encounter (HOSPITAL_COMMUNITY): Payer: Self-pay | Admitting: Psychiatry

## 2020-08-15 DIAGNOSIS — I120 Hypertensive chronic kidney disease with stage 5 chronic kidney disease or end stage renal disease: Secondary | ICD-10-CM | POA: Diagnosis present

## 2020-08-15 DIAGNOSIS — N390 Urinary tract infection, site not specified: Secondary | ICD-10-CM | POA: Diagnosis present

## 2020-08-15 DIAGNOSIS — N186 End stage renal disease: Secondary | ICD-10-CM | POA: Diagnosis present

## 2020-08-15 DIAGNOSIS — Z7982 Long term (current) use of aspirin: Secondary | ICD-10-CM | POA: Diagnosis not present

## 2020-08-15 DIAGNOSIS — E10319 Type 1 diabetes mellitus with unspecified diabetic retinopathy without macular edema: Secondary | ICD-10-CM | POA: Diagnosis present

## 2020-08-15 DIAGNOSIS — E1022 Type 1 diabetes mellitus with diabetic chronic kidney disease: Secondary | ICD-10-CM | POA: Diagnosis present

## 2020-08-15 DIAGNOSIS — F330 Major depressive disorder, recurrent, mild: Secondary | ICD-10-CM

## 2020-08-15 DIAGNOSIS — K760 Fatty (change of) liver, not elsewhere classified: Secondary | ICD-10-CM | POA: Diagnosis present

## 2020-08-15 DIAGNOSIS — F4310 Post-traumatic stress disorder, unspecified: Secondary | ICD-10-CM | POA: Diagnosis not present

## 2020-08-15 DIAGNOSIS — F410 Panic disorder [episodic paroxysmal anxiety] without agoraphobia: Secondary | ICD-10-CM

## 2020-08-15 DIAGNOSIS — Z794 Long term (current) use of insulin: Secondary | ICD-10-CM | POA: Diagnosis not present

## 2020-08-15 DIAGNOSIS — N12 Tubulo-interstitial nephritis, not specified as acute or chronic: Secondary | ICD-10-CM | POA: Diagnosis present

## 2020-08-15 DIAGNOSIS — Z89431 Acquired absence of right foot: Secondary | ICD-10-CM | POA: Diagnosis not present

## 2020-08-15 DIAGNOSIS — Z79899 Other long term (current) drug therapy: Secondary | ICD-10-CM | POA: Diagnosis not present

## 2020-08-15 DIAGNOSIS — Z886 Allergy status to analgesic agent status: Secondary | ICD-10-CM | POA: Diagnosis not present

## 2020-08-15 DIAGNOSIS — F32A Depression, unspecified: Secondary | ICD-10-CM | POA: Diagnosis present

## 2020-08-15 DIAGNOSIS — N2581 Secondary hyperparathyroidism of renal origin: Secondary | ICD-10-CM | POA: Diagnosis present

## 2020-08-15 DIAGNOSIS — J9811 Atelectasis: Secondary | ICD-10-CM | POA: Diagnosis present

## 2020-08-15 DIAGNOSIS — E10649 Type 1 diabetes mellitus with hypoglycemia without coma: Secondary | ICD-10-CM | POA: Diagnosis present

## 2020-08-15 DIAGNOSIS — E7800 Pure hypercholesterolemia, unspecified: Secondary | ICD-10-CM | POA: Diagnosis present

## 2020-08-15 DIAGNOSIS — R6510 Systemic inflammatory response syndrome (SIRS) of non-infectious origin without acute organ dysfunction: Secondary | ICD-10-CM | POA: Diagnosis present

## 2020-08-15 DIAGNOSIS — Z20822 Contact with and (suspected) exposure to covid-19: Secondary | ICD-10-CM | POA: Diagnosis present

## 2020-08-15 DIAGNOSIS — Z888 Allergy status to other drugs, medicaments and biological substances status: Secondary | ICD-10-CM | POA: Diagnosis not present

## 2020-08-15 DIAGNOSIS — Z881 Allergy status to other antibiotic agents status: Secondary | ICD-10-CM | POA: Diagnosis not present

## 2020-08-15 DIAGNOSIS — Z79890 Hormone replacement therapy (postmenopausal): Secondary | ICD-10-CM | POA: Diagnosis not present

## 2020-08-15 DIAGNOSIS — Z882 Allergy status to sulfonamides status: Secondary | ICD-10-CM | POA: Diagnosis not present

## 2020-08-15 DIAGNOSIS — Z992 Dependence on renal dialysis: Secondary | ICD-10-CM | POA: Diagnosis not present

## 2020-08-15 DIAGNOSIS — E1065 Type 1 diabetes mellitus with hyperglycemia: Secondary | ICD-10-CM | POA: Diagnosis present

## 2020-08-15 LAB — URINALYSIS, ROUTINE W REFLEX MICROSCOPIC
Bilirubin Urine: NEGATIVE
Glucose, UA: >=500 mg/dL — AB
Hgb urine dipstick: NEGATIVE
Ketones, ur: NEGATIVE mg/dL
Leukocytes,Ua: NEGATIVE
Nitrite: NEGATIVE
Protein, ur: >=300 mg/dL — AB
Specific Gravity, Urine: 1.007 (ref 1.005–1.030)
pH: 8 (ref 5.0–8.0)

## 2020-08-15 LAB — CBC
HCT: 29.2 % — ABNORMAL LOW (ref 36.0–46.0)
Hemoglobin: 9.3 g/dL — ABNORMAL LOW (ref 12.0–15.0)
MCH: 31.2 pg (ref 26.0–34.0)
MCHC: 31.8 g/dL (ref 30.0–36.0)
MCV: 98 fL (ref 80.0–100.0)
Platelets: 283 10*3/uL (ref 150–400)
RBC: 2.98 MIL/uL — ABNORMAL LOW (ref 3.87–5.11)
RDW: 13.6 % (ref 11.5–15.5)
WBC: 10.3 10*3/uL (ref 4.0–10.5)
nRBC: 0 % (ref 0.0–0.2)

## 2020-08-15 LAB — GLUCOSE, CAPILLARY
Glucose-Capillary: 76 mg/dL (ref 70–99)
Glucose-Capillary: 43 mg/dL — CL (ref 70–99)
Glucose-Capillary: 87 mg/dL (ref 70–99)
Glucose-Capillary: 117 mg/dL — ABNORMAL HIGH (ref 70–99)

## 2020-08-15 LAB — COMPREHENSIVE METABOLIC PANEL
ALT: 17 U/L (ref 0–44)
AST: 19 U/L (ref 15–41)
Albumin: 2.8 g/dL — ABNORMAL LOW (ref 3.5–5.0)
Alkaline Phosphatase: 54 U/L (ref 38–126)
Anion gap: 13 (ref 5–15)
BUN: 25 mg/dL — ABNORMAL HIGH (ref 6–20)
CO2: 31 mmol/L (ref 22–32)
Calcium: 7.4 mg/dL — ABNORMAL LOW (ref 8.9–10.3)
Chloride: 92 mmol/L — ABNORMAL LOW (ref 98–111)
Creatinine, Ser: 5.16 mg/dL — ABNORMAL HIGH (ref 0.44–1.00)
GFR, Estimated: 9 mL/min — ABNORMAL LOW (ref >60)
Glucose, Bld: 67 mg/dL — ABNORMAL LOW (ref 70–99)
Potassium: 3.7 mmol/L (ref 3.5–5.1)
Sodium: 136 mmol/L (ref 135–145)
Total Bilirubin: 1.1 mg/dL (ref 0.3–1.2)
Total Protein: 6.4 g/dL — ABNORMAL LOW (ref 6.5–8.1)

## 2020-08-15 LAB — CBG MONITORING, ED
Glucose-Capillary: 58 mg/dL — ABNORMAL LOW (ref 70–99)
Glucose-Capillary: 147 mg/dL — ABNORMAL HIGH (ref 70–99)
Glucose-Capillary: 100 mg/dL — ABNORMAL HIGH (ref 70–99)

## 2020-08-15 LAB — HIV ANTIBODY (ROUTINE TESTING W REFLEX): HIV Screen 4th Generation wRfx: NONREACTIVE

## 2020-08-15 MED ORDER — CHLORHEXIDINE GLUCONATE CLOTH 2 % EX PADS: 6.0000 | MEDICATED_PAD | Freq: Every day | CUTANEOUS | Status: DC

## 2020-08-15 MED ORDER — CLONAZEPAM 1 MG PO TABS: ORAL_TABLET | ORAL | 1 refills | Status: DC

## 2020-08-15 MED ORDER — DULOXETINE HCL 60 MG PO CPEP: 60.0000 mg | ORAL_CAPSULE | Freq: Every day | ORAL | 2 refills | Status: DC

## 2020-08-15 MED ORDER — INSULIN GLARGINE 100 UNIT/ML ~~LOC~~ SOLN
25.0000 [IU] | Freq: Two times a day (BID) | SUBCUTANEOUS | Status: DC
  Administered 2020-08-15: 25 [IU] via SUBCUTANEOUS
  Filled 2020-08-15 (×4): qty 0.25

## 2020-08-15 NOTE — ED Notes (Signed)
Pt sitting up in bed eating breakfast

## 2020-08-15 NOTE — Progress Notes (Signed)
Progress Note    Suzanne Allen  AYT:[id number] DOB: 1961/03/25  DOA: 08/14/2020 PCP: Mindi Curling, PA-C    Brief Narrative:     Medical records reviewed and are as summarized below:  Suzanne Allen is an 60 y.o. female with medical history significant of DM1, ESRD.  Pt presents to ED with c/o fever.  Pt woke up this AM with chills.  Symptoms worsened throughout the day, developed back pain.  Back pain is B, constant, severe.  No radiation.  Symptoms worsened while at dialysis.  Assessment/Plan:   Principal Problem:   SIRS (systemic inflammatory response syndrome) (HCC) Active Problems:   Diabetes type 1, uncontrolled (HCC)   Benign essential HTN   ESRD on dialysis ([place])   SIRS - -Suspect urinary source but need UTI to confirm -renal u/s virtually inremarkable -if culture of urine negative can consider MRI of spine -Got empiric zosyn / lzd in ED -WBC count normalized -await cultures -check x ray of foot to r/o osteo  DM1 - -lower dose of lantus  HTN - Hold lisinopril for the moment  ESRD - -due for HD in AM (can go to outpatient at 11:30 if d/c'd)    Family Communication/Anticipated D/C date and plan/Code Status   DVT prophylaxis: Lovenox ordered. Code Status: Full Code.   Disposition Plan: Status is: Observation  The patient will require care spanning > 2 midnights and should be moved to inpatient because: IV treatments appropriate due to intensity of illness or inability to take PO  Dispo: The patient is from: Home              Anticipated d/c is to: Home              Anticipated d/c date is: 2 days              Patient currently is not medically stable to d/c.   Difficult to place patient No         Medical Consultants:    Renal in AM for HD if not able to be d/c'd    Subjective:   Asking about results of renal U/S  Objective:    Vitals:   08/15/20 0730 08/15/20 1000 08/15/20 1100 08/15/20 1339  BP: (!) 153/137 130/79 (!)  151/78 (!) 126/52  Pulse: 88 81 76 81  Resp: 20 (!) '23 16 15  ' Temp:    98.4 F (36.9 C)  TempSrc:    Oral  SpO2: 100% 97% 100% 100%  Weight:      Height:       No intake or output data in the 24 hours ending 08/15/20 1504 Filed Weights   08/14/20 1626  Weight: 67.1 kg    Exam:  General: Appearance:     Overweight female in no acute distress     Lungs:     Diminished, respirations unlabored  Heart:    Normal heart rate. Normal rhythm. No murmurs, rubs, or gallops.   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      Neurologic:   Awake, alert, oriented x 3. No apparent focal neurological           defect.  Flat affect    Data Reviewed:   I have personally reviewed following labs and imaging studies:  Labs: Labs show the following:   Basic Metabolic Panel: Recent Labs  Lab 08/14/20 1648 08/15/20 0328  NA 136 136  K  3.5 3.7  CL 91* 92*  CO2 31 31  GLUCOSE 125* 67*  BUN 15 25*  CREATININE 3.31* 5.16*  CALCIUM 7.8* 7.4*   GFR Estimated Creatinine Clearance: 10.9 mL/min (A) (by C-G formula based on SCr of 5.16 mg/dL (H)). Liver Function Tests: Recent Labs  Lab 08/14/20 1648 08/15/20 0328  AST 19 19  ALT 19 17  ALKPHOS 69 54  BILITOT 0.7 1.1  PROT 8.0 6.4*  ALBUMIN 3.5 2.8*   No results for input(s): LIPASE, AMYLASE in the last 168 hours. No results for input(s): AMMONIA in the last 168 hours. Coagulation profile No results for input(s): INR, PROTIME in the last 168 hours.  CBC: Recent Labs  Lab 08/14/20 1648 08/15/20 0328  WBC 15.0* 10.3  NEUTROABS 13.6*  --   HGB 10.5* 9.3*  HCT 32.9* 29.2*  MCV 97.3 98.0  PLT 329 283   Cardiac Enzymes: No results for input(s): CKTOTAL, CKMB, CKMBINDEX, TROPONINI in the last 168 hours. BNP (last 3 results) No results for input(s): PROBNP in the last 8760 hours. CBG: Recent Labs  Lab 08/14/20 2326 08/15/20 0717 08/15/20 0911 08/15/20 1233  GLUCAP 165* 58* 147* 100*    D-Dimer: No results for input(s): DDIMER in the last 72 hours. Hgb A1c: Recent Labs    08/14/20 2317  HGBA1C 6.7*   Lipid Profile: No results for input(s): CHOL, HDL, LDLCALC, TRIG, CHOLHDL, LDLDIRECT in the last 72 hours. Thyroid function studies: No results for input(s): TSH, T4TOTAL, T3FREE, THYROIDAB in the last 72 hours.  Invalid input(s): FREET3 Anemia work up: No results for input(s): VITAMINB12, FOLATE, FERRITIN, TIBC, IRON, RETICCTPCT in the last 72 hours. Sepsis Labs: Recent Labs  Lab 08/14/20 1646 08/14/20 1648 08/15/20 0328  WBC  --  15.0* 10.3  LATICACIDVEN 1.4  --   --     Microbiology Recent Results (from the past 240 hour(s))  Blood culture (routine x 2)     Status: None (Preliminary result)   Collection Time: 08/14/20  7:54 PM   Specimen: BLOOD  Result Value Ref Range Status   Specimen Description BLOOD SITE NOT SPECIFIED  Final   Special Requests   Final    BOTTLES DRAWN AEROBIC AND ANAEROBIC Blood Culture results may not be optimal due to an excessive volume of blood received in culture bottles   Culture   Final    NO GROWTH < 12 HOURS Performed at [facility] [address], [place], [place] [address]    Report Status PENDING  Incomplete  SARS Coronavirus 2 by RT PCR (hospital order, performed in [facility] hospital lab) Nasopharyngeal Nasopharyngeal Swab     Status: None   Collection Time: 08/14/20  8:00 PM   Specimen: Nasopharyngeal Swab  Result Value Ref Range Status   SARS Coronavirus 2 NEGATIVE NEGATIVE Final    Comment: (NOTE) SARS-CoV-2 target nucleic acids are NOT DETECTED.  The SARS-CoV-2 RNA is generally detectable in upper and lower respiratory specimens during the acute phase of infection. The lowest concentration of SARS-CoV-2 viral copies this assay can detect is 250 copies / mL. A negative result does not preclude SARS-CoV-2 infection and should not be used as the sole basis for treatment or other patient management  decisions.  A negative result may occur with improper specimen collection / handling, submission of specimen other than nasopharyngeal swab, presence of viral mutation(s) within the areas targeted by this assay, and inadequate number of viral copies (<250 copies / mL). A negative result  must be combined with clinical observations, patient history, and epidemiological information.  Fact Sheet for Patients:   StrictlyIdeas.no  Fact Sheet for Healthcare Providers: BankingDealers.co.za  This test is not yet approved or  cleared by the [place] FDA and has been authorized for detection and/or diagnosis of SARS-CoV-2 by FDA under an Emergency Use Authorization (EUA).  This EUA will remain in effect (meaning this test can be used) for the duration of the COVID-19 declaration under Section 564(b)(1) of the Act, 21 U.S.C. section 360bbb-3(b)(1), unless the authorization is terminated or revoked sooner.  Performed at [facility], [place] [address], [place], [place] [address]     Procedures and diagnostic studies:  US RENAL  Result Date: 2020/08/30 CLINICAL DATA:  Pyelonephritis EXAM: RENAL / URINARY TRACT ULTRASOUND COMPLETE COMPARISON:  CT renal colic [id number]/41/7408 FINDINGS: Right Kidney: Renal measurements: 10.5 x 4.7 x 3.5 cm = volume: 91 mL. Echogenicity is within normal limits. No concerning renal mass, shadowing calculus or hydronephrosis. Left Kidney: Renal measurements: 11.9 x 4.8 x 4.5 cm = volume: 133 mL. Echogenicity is within normal limits. No concerning renal mass, shadowing calculus or hydronephrosis. Bladder: Urinary bladder is partially decompressed at the time of exam. Mild wall thickening may be related to this underdistention though should correlate with urinalysis. Other: None. IMPRESSION: 1. Unremarkable sonographic appearance of the kidneys. Please note, pyelonephritis may be sonographically occult. 2. Urinary bladder is  partially decompressed at the time of exam. Mild wall thickening may be related to this underdistention though should correlate with urinalysis. Electronically Signed   By: Lovena Le M.D.   On: 08-30-2020 00:14   DG Chest Portable 1 View  Result Date: 08/14/2020 CLINICAL DATA:  Fever. EXAM: PORTABLE CHEST 1 VIEW COMPARISON:  08/31/2018 FINDINGS: Normal heart size and mediastinal contours. Subsegmental atelectasis at the left lung base. No confluent consolidation. No pulmonary edema, pleural effusion or pneumothorax. Remote left rib fracture. No acute osseous abnormalities are seen. IMPRESSION: Subsegmental atelectasis at the left lung base. No focal consolidation. Electronically Signed   By: Keith Rake M.D.   On: 08/14/2020 19:55    Medications:   . aspirin EC  81 mg Oral Daily  . cholecalciferol  5,000 Units Oral Daily  . dorzolamide-timolol  1 drop Both Eyes BID  . DULoxetine  60 mg Oral Daily  . enoxaparin (LOVENOX) injection  30 mg Subcutaneous Q24H  . ferric citrate  420 mg Oral TID WC   And  . ferric citrate  210 mg Oral With snacks  . insulin aspart  0-15 Units Subcutaneous TID WC  . insulin aspart  3 Units Subcutaneous TID WC  . insulin glargine  25 Units Subcutaneous BID  . levothyroxine  150 mcg Oral QAC breakfast  . multivitamin  1 tablet Oral QHS  . rosuvastatin  40 mg Oral Daily  . traZODone  50 mg Oral QHS   Continuous Infusions: . linezolid (ZYVOX) IV Stopped (08-30-2020 1107)  . piperacillin-tazobactam (ZOSYN)  IV 2.25 g (08/30/20 1455)     LOS: 0 days   [place] [facility]   How to contact the [facility] Attending or Consulting provider [place] or covering provider during after hours [place], for this patient?  1. Check the care team in [facility] and look for a) attending/consulting TRH provider listed and b) the [facility] team listed 2. Log into [email or web address] and use [place]'s universal password to access. If you do not have the password, please contact  the hospital operator. 3. Locate the [facility] provider you are looking for under [facility] and page to a number that you can be directly reached. 4. If you still have difficulty reaching the provider, please page the [facility] (Director on Call) for the Hospitalists listed on amion for assistance.  08/15/2020, 3:04 PM

## 2020-08-15 NOTE — ED Notes (Signed)
Breakfast ordered 

## 2020-08-15 NOTE — ED Notes (Addendum)
Appears to be sleeping. Lab in for labs.

## 2020-08-15 NOTE — ED Notes (Signed)
Dinner tray ordered.

## 2020-08-15 NOTE — Progress Notes (Signed)
Virtual Visit via Telephone Note  I connected with Suzanne Allen on 08/15/20 at  9:20 AM EST by telephone and verified that I am speaking with the correct person using two identifiers.  Location: Patient: In ER Provider: Home Office   I discussed the limitations, risks, security and privacy concerns of performing an evaluation and management service by telephone and the availability of in person appointments. I also discussed with the patient that there may be a patient responsible charge related to this service. The patient expressed understanding and agreed to proceed.   History of Present Illness: Patient is evaluated by phone session.  She is in the ER since yesterday because having low-grade fever, back pain.  Patient told they did COVID test which was negative but now she believes having UTI.  Her fever has subsided but is still feeling tired.  She is taking Cymbalta and Klonopin and occasionally trazodone when she cannot sleep very well.  She has chronic anxiety symptoms related to dialysis.  Even though she had almost 2 years for the still struggle with panic attacks, having dreams and nervousness when she think about dialysis and going before dialysis.  She reported her conditions is chronic and is not getting worse but also has not seen a huge improvement.  She had not missed any dialysis treatment so far.  She reported her appetite is okay and her weight is unchanged from the past.  She denies any paranoia, hallucination or any suicidal thoughts.  She denies any crying spells.  We have discussed therapy but patient is resistant but agreed that she will consider if symptoms gets worse.  She has no tremors, shakes or any EPS.    Past Psychiatric History: H/Odepression,anxiety,abusefrom father andPTSD.Witness husband committed suicide in 1996. TriedEffexor, Prozac, Celexa and Abilify.Noh/oinpatient.  Recent Results (from the past 2160 hour(s))  Lactic acid, plasma      Status: None   Collection Time: 08/14/20  4:46 PM  Result Value Ref Range   Lactic Acid, Venous 1.4 0.5 - 1.9 mmol/L    Comment: Performed at [facility], [address], [place], [place] [address]  Comprehensive metabolic panel     Status: Abnormal   Collection Time: 08/14/20  4:48 PM  Result Value Ref Range   Sodium 136 135 - 145 mmol/L   Potassium 3.5 3.5 - 5.1 mmol/L   Chloride 91 (L) 98 - 111 mmol/L   CO2 31 22 - 32 mmol/L   Glucose, Bld 125 (H) 70 - 99 mg/dL    Comment: Glucose reference range applies only to samples taken after fasting for at least 8 hours.   BUN 15 6 - 20 mg/dL   Creatinine, Ser 3.31 (H) 0.44 - 1.00 mg/dL   Calcium 7.8 (L) 8.9 - 10.3 mg/dL   Total Protein 8.0 6.5 - 8.1 g/dL   Albumin 3.5 3.5 - 5.0 g/dL   AST 19 15 - 41 U/L   ALT 19 0 - 44 U/L   Alkaline Phosphatase 69 38 - 126 U/L   Total Bilirubin 0.7 0.3 - 1.2 mg/dL   GFR, Estimated 16 (L) >60 mL/min    Comment: (NOTE) Calculated using the CKD-EPI Creatinine Equation (2021)    Anion gap 14 5 - 15    Comment: Performed at [facility] [address], [place], [place] [address]  CBC with Differential     Status: Abnormal   Collection Time: 08/14/20  4:48 PM  Result Value Ref Range  WBC 15.0 (H) 4.0 - 10.5 K/uL   RBC 3.38 (L) 3.87 - 5.11 MIL/uL   Hemoglobin 10.5 (L) 12.0 - 15.0 g/dL   HCT 32.9 (L) 36.0 - 46.0 %   MCV 97.3 80.0 - 100.0 fL   MCH 31.1 26.0 - 34.0 pg   MCHC 31.9 30.0 - 36.0 g/dL   RDW 13.3 11.5 - 15.5 %   Platelets 329 150 - 400 K/uL   nRBC 0.0 0.0 - 0.2 %   Neutrophils Relative % 90 %   Neutro Abs 13.6 (H) 1.7 - 7.7 K/uL   Lymphocytes Relative 3 %   Lymphs Abs 0.4 (L) 0.7 - 4.0 K/uL   Monocytes Relative 6 %   Monocytes Absolute 0.9 0.1 - 1.0 K/uL   Eosinophils Relative 0 %   Eosinophils Absolute 0.0 0.0 - 0.5 K/uL   Basophils Relative 0 %   Basophils Absolute 0.0 0.0 - 0.1 K/uL   Immature Granulocytes 1 %   Abs Immature Granulocytes 0.07 0.00 - 0.07 K/uL     Comment: Performed at [facility] [address] [place], [place] [address]  POC SARS Coronavirus 2 Ag-ED - Nasal Swab (BD Veritor Kit)     Status: None   Collection Time: 08/14/20  7:39 PM  Result Value Ref Range   SARS Coronavirus 2 Ag NEGATIVE NEGATIVE    Comment: (NOTE) SARS-CoV-2 antigen NOT DETECTED.   Negative results are presumptive.  Negative results do not preclude SARS-CoV-2 infection and should not be used as the sole basis for treatment or other patient management decisions, including infection  control decisions, particularly in the presence of clinical signs and  symptoms consistent with COVID-19, or in those who have been in contact with the virus.  Negative results must be combined with clinical observations, patient history, and epidemiological information. The expected result is Negative.  Fact Sheet for Patients: HandmadeRecipes.com.cy  Fact Sheet for Healthcare Providers: FuneralLife.at  This test is not yet approved or cleared by the [place] FDA and  has been authorized for detection and/or diagnosis of SARS-CoV-2 by FDA under an Emergency Use Authorization (EUA).  This EUA will remain in effect (meaning this test can be used) for the duration of  the COV ID-19 declaration under Section 564(b)(1) of the Act, 21 U.S.C. section 360bbb-3(b)(1), unless the authorization is terminated or revoked sooner.    Blood culture (routine x 2)     Status: None (Preliminary result)   Collection Time: 08/14/20  7:54 PM   Specimen: BLOOD  Result Value Ref Range   Specimen Description BLOOD SITE NOT SPECIFIED    Special Requests      BOTTLES DRAWN AEROBIC AND ANAEROBIC Blood Culture results may not be optimal due to an excessive volume of blood received in culture bottles   Culture      NO GROWTH < 12 HOURS Performed at [facility] [address], [place], [place] [address]    Report Status PENDING    SARS Coronavirus 2 by RT PCR (hospital order, performed in [facility] hospital lab) Nasopharyngeal Nasopharyngeal Swab     Status: None   Collection Time: 08/14/20  8:00 PM   Specimen: Nasopharyngeal Swab  Result Value Ref Range   SARS Coronavirus 2 NEGATIVE NEGATIVE    Comment: (NOTE) SARS-CoV-2 target nucleic acids are NOT DETECTED.  The SARS-CoV-2 RNA is generally detectable in upper and lower respiratory specimens during the acute phase of infection. The lowest concentration of SARS-CoV-2 viral  copies this assay can detect is 250 copies / mL. A negative result does not preclude SARS-CoV-2 infection and should not be used as the sole basis for treatment or other patient management decisions.  A negative result may occur with improper specimen collection / handling, submission of specimen other than nasopharyngeal swab, presence of viral mutation(s) within the areas targeted by this assay, and inadequate number of viral copies (<250 copies / mL). A negative result must be combined with clinical observations, patient history, and epidemiological information.  Fact Sheet for Patients:   StrictlyIdeas.no  Fact Sheet for Healthcare Providers: BankingDealers.co.za  This test is not yet approved or  cleared by the [place] FDA and has been authorized for detection and/or diagnosis of SARS-CoV-2 by FDA under an Emergency Use Authorization (EUA).  This EUA will remain in effect (meaning this test can be used) for the duration of the COVID-19 declaration under Section 564(b)(1) of the Act, 21 U.S.C. section 360bbb-3(b)(1), unless the authorization is terminated or revoked sooner.  Performed at [facility], [place] [address] [place], [place] [address]   Hemoglobin A1c     Status: Abnormal   Collection Time: 08/14/20 11:17 PM  Result Value Ref Range   Hgb A1c MFr Bld 6.7 (H) 4.8 - 5.6 %    Comment: (NOTE) Pre diabetes:           5.7%-6.4%  Diabetes:              >6.4%  Glycemic control for   <7.0% adults with diabetes    Mean Plasma Glucose 145.59 mg/dL    Comment: Performed at [facility] [address], [place], [place] [address]  CBG monitoring, ED     Status: Abnormal   Collection Time: 08/14/20 11:26 PM  Result Value Ref Range   Glucose-Capillary 165 (H) 70 - 99 mg/dL    Comment: Glucose reference range applies only to samples taken after fasting for at least 8 hours.  CBC     Status: Abnormal   Collection Time: 08/15/20  3:28 AM  Result Value Ref Range   WBC 10.3 4.0 - 10.5 K/uL   RBC 2.98 (L) 3.87 - 5.11 MIL/uL   Hemoglobin 9.3 (L) 12.0 - 15.0 g/dL   HCT 29.2 (L) 36.0 - 46.0 %   MCV 98.0 80.0 - 100.0 fL   MCH 31.2 26.0 - 34.0 pg   MCHC 31.8 30.0 - 36.0 g/dL   RDW 13.6 11.5 - 15.5 %   Platelets 283 150 - 400 K/uL   nRBC 0.0 0.0 - 0.2 %    Comment: Performed at [facility], [place] [address], [place], [place] [address]  Comprehensive metabolic panel     Status: Abnormal   Collection Time: 08/15/20  3:28 AM  Result Value Ref Range   Sodium 136 135 - 145 mmol/L   Potassium 3.7 3.5 - 5.1 mmol/L   Chloride 92 (L) 98 - 111 mmol/L   CO2 31 22 - 32 mmol/L   Glucose, Bld 67 (L) 70 - 99 mg/dL    Comment: Glucose reference range applies only to samples taken after fasting for at least 8 hours.   BUN 25 (H) 6 - 20 mg/dL   Creatinine, Ser 5.16 (H) 0.44 - 1.00 mg/dL    Comment: DELTA CHECK NOTED   Calcium 7.4 (L) 8.9 - 10.3 mg/dL   Total Protein 6.4 (L) 6.5 - 8.1 g/dL   Albumin 2.8 (L) 3.5 - 5.0  g/dL   AST 19 15 - 41 U/L   ALT 17 0 - 44 U/L   Alkaline Phosphatase 54 38 - 126 U/L   Total Bilirubin 1.1 0.3 - 1.2 mg/dL   GFR, Estimated 9 (L) >60 mL/min    Comment: (NOTE) Calculated using the CKD-EPI Creatinine Equation (2021)    Anion gap 13 5 - 15    Comment: Performed at [facility] [address] [place], [place] [address]  CBG monitoring, ED     Status: Abnormal    Collection Time: 08/15/20  7:17 AM  Result Value Ref Range   Glucose-Capillary 58 (L) 70 - 99 mg/dL    Comment: Glucose reference range applies only to samples taken after fasting for at least 8 hours.  Urinalysis, Routine w reflex microscopic     Status: Abnormal   Collection Time: 08/15/20  7:29 AM  Result Value Ref Range   Color, Urine YELLOW YELLOW   APPearance HAZY (A) CLEAR   Specific Gravity, Urine 1.007 1.005 - 1.030   pH 8.0 5.0 - 8.0   Glucose, UA >=500 (A) NEGATIVE mg/dL   Hgb urine dipstick NEGATIVE NEGATIVE   Bilirubin Urine NEGATIVE NEGATIVE   Ketones, ur NEGATIVE NEGATIVE mg/dL   Protein, ur >=300 (A) NEGATIVE mg/dL   Nitrite NEGATIVE NEGATIVE   Leukocytes,Ua NEGATIVE NEGATIVE   RBC / HPF 0-5 0 - 5 RBC/hpf   WBC, UA 0-5 0 - 5 WBC/hpf   Bacteria, UA MANY (A) NONE SEEN   Squamous Epithelial / LPF 11-20 0 - 5   Mucus PRESENT     Comment: Performed at [facility], [address], [place], [place] [address]  CBG monitoring, ED     Status: Abnormal   Collection Time: 08/15/20  9:11 AM  Result Value Ref Range   Glucose-Capillary 147 (H) 70 - 99 mg/dL    Comment: Glucose reference range applies only to samples taken after fasting for at least 8 hours.   Comment 1 Notify RN    Comment 2 Document in Chart     Psychiatric Specialty Exam: Physical Exam  Review of Systems  Weight 148 lb (67.1 kg).There is no height or weight on file to calculate BMI.  General Appearance: NA  Eye Contact:  NA  Speech:  Slow  Volume:  Decreased  Mood:  Anxious  Affect:  NA  Thought Process:  Goal Directed  Orientation:  Full (Time, Place, and Person)  Thought Content:  Rumination  Suicidal Thoughts:  No  Homicidal Thoughts:  No  Memory:  Immediate;   Good Recent;   Good Remote;   Good  Judgement:  Intact  Insight:  Shallow  Psychomotor Activity:  NA  Concentration:  Concentration: Good and Attention Span: Good  Recall:  Good  Fund of Knowledge:  Good  Language:  Good   Akathisia:  No  Handed:  Right  AIMS (if indicated):     Assets:  Communication Skills Desire for Improvement Housing  ADL's:  Intact  Cognition:  WNL  Sleep:   ok      Assessment and Plan: Panic attacks.  PTSD.  Major depressive disorder, recurrent.  Patient currently in ER for low-grade fevers and back pain.  I reviewed blood work results.  She does not want to change the medication since she feels it is keeping her stable.  Once again we discussed therapy but patient at this time not interested but agree to give [place] a call if  symptoms get worse.  I will continue Klonopin 1 mg before dialysis and second if needed and continue Cymbalta 60 mg daily.  Recommended to call us back if she has any question or any concern.  Follow-up in 3 months.  Follow Up Instructions:    I discussed the assessment and treatment plan with the patient. The patient was provided an opportunity to ask questions and all were answered. The patient agreed with the plan and demonstrated an understanding of the instructions.   The patient was advised to call back or seek an in-person evaluation if the symptoms worsen or if the condition fails to improve as anticipated.  I provided 13 minutes of non-face-to-face time during this encounter.   [name], MD

## 2020-08-15 NOTE — Progress Notes (Signed)
CRITICAL VALUE STICKER  CRITICAL VALUE: CBG 2  DATE & TIME NOTIFIED: RN Rod Holler and Inocente Salles notified   MD NOTIFIED: Dr. Eliseo Squires 6:17 PM   TIME OF NOTIFICATION: 6:16 PM   RESPONSE: Gave patient crackers and peanut butter as well as ginger ale will recheck CBG in about 15 mins

## 2020-08-16 DIAGNOSIS — R6510 Systemic inflammatory response syndrome (SIRS) of non-infectious origin without acute organ dysfunction: Secondary | ICD-10-CM | POA: Diagnosis not present

## 2020-08-16 LAB — GLUCOSE, CAPILLARY
Glucose-Capillary: 62 mg/dL — ABNORMAL LOW (ref 70–99)
Glucose-Capillary: 123 mg/dL — ABNORMAL HIGH (ref 70–99)
Glucose-Capillary: 160 mg/dL — ABNORMAL HIGH (ref 70–99)
Glucose-Capillary: 198 mg/dL — ABNORMAL HIGH (ref 70–99)
Glucose-Capillary: 102 mg/dL — ABNORMAL HIGH (ref 70–99)
Glucose-Capillary: 155 mg/dL — ABNORMAL HIGH (ref 70–99)

## 2020-08-16 LAB — URINE CULTURE: Culture: <10000 — AB

## 2020-08-16 LAB — CBC
HCT: 26.9 % — ABNORMAL LOW (ref 36.0–46.0)
Hemoglobin: 8.7 g/dL — ABNORMAL LOW (ref 12.0–15.0)
MCH: 31.1 pg (ref 26.0–34.0)
MCHC: 32.3 g/dL (ref 30.0–36.0)
MCV: 96.1 fL (ref 80.0–100.0)
Platelets: 263 10*3/uL (ref 150–400)
RBC: 2.8 MIL/uL — ABNORMAL LOW (ref 3.87–5.11)
RDW: 13.4 % (ref 11.5–15.5)
WBC: 10.4 10*3/uL (ref 4.0–10.5)
nRBC: 0 % (ref 0.0–0.2)

## 2020-08-16 LAB — PROCALCITONIN: Procalcitonin: 1.41 ng/mL

## 2020-08-16 LAB — PHOSPHORUS: Phosphorus: 7.1 mg/dL — ABNORMAL HIGH (ref 2.5–4.6)

## 2020-08-16 LAB — SEDIMENTATION RATE: Sed Rate: 102 mm/hr — ABNORMAL HIGH (ref 0–22)

## 2020-08-16 MED ORDER — INSULIN GLARGINE 100 UNIT/ML ~~LOC~~ SOLN
25.0000 [IU] | Freq: Every day | SUBCUTANEOUS | Status: DC
  Administered 2020-08-17: 25 [IU] via SUBCUTANEOUS
  Filled 2020-08-16 (×2): qty 0.25

## 2020-08-16 MED ORDER — LISINOPRIL 40 MG PO TABS
40.0000 mg | ORAL_TABLET | Freq: Every day | ORAL | Status: DC
  Administered 2020-08-16 – 2020-08-17 (×2): 40 mg via ORAL
  Filled 2020-08-16 (×2): qty 1

## 2020-08-16 MED ORDER — ATORVASTATIN CALCIUM 80 MG PO TABS
80.0000 mg | ORAL_TABLET | Freq: Every day | ORAL | Status: DC
  Administered 2020-08-17: 80 mg via ORAL
  Filled 2020-08-16: qty 1

## 2020-08-16 MED ORDER — CHLORHEXIDINE GLUCONATE CLOTH 2 % EX PADS
6.0000 | MEDICATED_PAD | Freq: Every day | CUTANEOUS | Status: DC
  Administered 2020-08-17: 6 via TOPICAL

## 2020-08-16 NOTE — Progress Notes (Signed)
Pt was on Crestor '40mg'$  but she is ESRD. The maximum recommended dose is '10mg'$ . D/w Dr. Eliseo Squires and we will change Crestor to atorvastatin '80mg'$  qday.  Onnie Boer, PharmD, BCIDP, AAHIVP, CPP Infectious Disease Pharmacist 08/16/2020 2:58 PM

## 2020-08-16 NOTE — Consult Note (Addendum)
[facility] Renal Consultation Note    Indication for Consultation:  Management of ESRD/hemodialysis; anemia, hypertension/volume and secondary hyperparathyroidism  [id number], Cherylann Ratel, PA-C  HPI: Suzanne Allen is a 60 y.o. female with ESRD on HD MWF at [facility] since 08/2018.  Past medical history significant for HTN, HLD, DM with retinopathy/neuropathy, PVD s/p R TMA, and hypothyroidism.  Of note patient is mostly complaint with prescribed dialysis regimen and often develops n/v during dialysis.    Patient presented to the ED post dialysis due to fever, rigors and back pain.  Reports waking with rigors and developed severe back pain Wednesday morning.  Afebrile at home.  Went to dialysis where she was noted to be febrile, unwell appearing with ongoing chills and back pain.  States her symptoms continued to worsen, treatment was stopped about 50mn early and she was sent to the hospital.  Back pain improved while in the ED.  Denies CP, SOB, edema, orthopnea, weakness, dizziness, abdominal pain and n/v/d.  Admits to bloating and fatigue.   Pertinent findings in the ED include hypertension, sed rate 102, covid negative, UA with many bacteria but UC with insignificant growth, BC with NGTD, CXR with no edema or focal consolidation, Renal [place] no acute abnormalities and x ray R foot with no acute findings. Patient has been admitted for further evaluation and management.   Past Medical History:  Diagnosis Date  . 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 Anemia   . Anxiety   . CAP (community acquired pneumonia) 03/13/2015  . Carpal tunnel syndrome 11/28/2014   Bilateral  . Chronic pancreatitis ([place]   . Depression   . Diabetes mellitus type I ([place] dx'd 1977  . Diabetic osteomyelitis ([place]    right great toe/progress notes 09/23/2012  . Diabetic retinopathy ([place]   . ESRD (end stage renal disease) [place]    ESRD- Hemo MWF- [facility]  . Fatty liver 04/04/2014  . HTN  (hypertension)   . Hypercholesteremia   . Hypothyroidism   . Macular degeneration, bilateral   . Migraines 1980's   "when I was in my 20's" (03/14/2015)  . Peripheral neuropathy   . Pneumonia 1993  . PTSD (post-traumatic stress disorder)   . Stroke ([facility] ? date & 09/22/2012   TIA's - no resuidual  . TIA (transient ischemic attack) 09/22/2012   /[name]4/15/2014   Past Surgical History:  Procedure Laterality Date  . A/V FISTULAGRAM N/A 08/31/2018   Procedure: A/V FISTULAGRAM - Left arm;  Surgeon: CMarty Heck MD;  [place];  Service: Cardiovascular;  Laterality: N/A;  . A/V FISTULAGRAM N/A 11/04/2018   Procedure: A/V FISTULAGRAM - Left Arm;  Surgeon: CMarty Heck MD;  [place];  Service: Cardiovascular;  Laterality: N/A;  . AMPUTATION Right 07/12/2015   Procedure: Right Transmetatarsal amputation;  Surgeon: MNewt Minion MD;  [place]  Service: Orthopedics;  Laterality: Right;  . BASCILIC VEIN TRANSPOSITION Left 12/31/2017   Procedure: FIRST STAGE BASILIC VEIN TRANSPOSITION LEFT ARM;  Surgeon: CConrad Wind Ridge MD;  [place]  Service: Vascular;  Laterality: Left;  . BASCILIC VEIN TRANSPOSITION Left 02/21/2018   Procedure: SECOND STAGE BASILIC VEIN TRANSPOSITION, LEFT UPPER ARM;  Surgeon: CMarty Heck MD;  [place]  Service: Vascular;  Laterality: Left;  . CATARACT EXTRACTION, BILATERAL    . [place] 1995  . EYE SURGERY Bilateral    "hemorrhaged behind my  eye"  . I & D EXTREMITY Right multiple   "big toe"  . INCISION AND DRAINAGE ABSCESS Left 12/08/2018   Procedure: Incision And Drainage of Left Axillary Abscess;  Surgeon: Marty Heck, MD;  Location: [place];  Service: Vascular;  Laterality: Left;  . PERIPHERAL VASCULAR INTERVENTION Left 08/31/2018   Procedure: PERIPHERAL VASCULAR INTERVENTION;  Surgeon: Marty Heck, MD;  Location: [place] CV LAB;  Service: Cardiovascular;   Laterality: Left;  . PERIPHERAL VASCULAR INTERVENTION Left 11/04/2018   Procedure: PERIPHERAL VASCULAR INTERVENTION;  Surgeon: Marty Heck, MD;  Location: [place] CV LAB;  Service: Cardiovascular;  Laterality: Left;  arm fistula  . PERIPHERALLY INSERTED CENTRAL CATHETER INSERTION Right 08/31/2012  . PICC LINE REMOVAL ([place] HX) Right 10/12/2012   [name] 10/12/2012  . REFRACTIVE SURGERY Bilateral 2013  . TOE AMPUTATION Right 11/2012   "big toe"  . TONSILLECTOMY  1965  . TOTAL THYROIDECTOMY  2012  . TUBAL LIGATION  1995   Family History  Problem Relation Age of Onset  . Depression Mother   . Diabetes Mother   . Alcohol abuse Father   . Alcohol abuse Sister   . Diabetes Maternal Grandmother    Social History:  reports that she has never smoked. She has never used smokeless tobacco. She reports that she does not drink alcohol and does not use drugs. Allergies  Allergen Reactions  . Erythromycin Anaphylaxis  . Isosorbide   . Norvasc [Amlodipine] Shortness Of Breath and Swelling  . Vancomycin Shortness Of Breath and Other (See Comments)    Red Man Syndrome  . Sulfa Antibiotics Other (See Comments)  . Nsaids Other (See Comments)    Avoid due to CKD  . Wellbutrin [Bupropion] Other (See Comments)    Extreme neurological effects    Prior to Admission medications   Medication Sig Start Date End Date Taking? Authorizing Provider  Ascorbic Acid (VITAMIN C GUMMIE PO) Take 2 tablets by mouth daily.   Yes [provider]  aspirin EC 81 MG tablet Take 81 mg by mouth daily. Swallow whole.   Yes [provider]  Cholecalciferol (VITAMIN D-3) 125 MCG (5000 UT) TABS Take 5,000 Units by mouth daily.   Yes [provider]  dorzolamide-timolol (COSOPT) 22.3-6.8 MG/ML ophthalmic solution Place 1 drop into both eyes 2 (two) times daily.    Yes [provider]  ferric citrate (AURYXIA) 1 GM 210 MG(Fe) tablet Take 420 mg by mouth See admin instructions. 2-3   tabs with meals, 1 tab with snacks   Yes [provider]  glucose blood test strip one strip (1 each total) by Other route 4 (four) times daily. Use as directed. Pharmacy, please dispense this brand of blood glucose test strips: Bayer Contour Next 03/09/17  Yes [provider]  insulin glargine (LANTUS) 100 UNIT/ML injection Inject 40 Units into the skin 2 (two) times daily.   Yes [provider]  insulin lispro (HUMALOG) 100 UNIT/ML injection Inject 20 Units into the skin See admin instructions. Inject up to 20 units subcutaneously three times daily before meals if needed Per sliding scale as directed   Yes [provider]  levothyroxine (SYNTHROID, LEVOTHROID) 150 MCG tablet Take 150 mcg by mouth daily before breakfast.   Yes [provider]  lidocaine-prilocaine (EMLA) cream APPLY SMALL AMOUNT TO ACCESS SITE (AVF) 3 TIMES A WEEK 1 HOUR BEFORE DIALYSIS. COVER WITH OCCLUSIVE DRESSING (SARAN WRAP) 11/11/18  Yes [provider]  lisinopril (PRINIVIL,ZESTRIL) 20 MG  tablet Take 20 mg by mouth at bedtime.  10/24/17  Yes [provider]  multivitamin (RENA-VIT) TABS tablet Take 1 tablet by mouth at bedtime.    Yes [provider]  ondansetron (ZOFRAN ODT) 4 MG disintegrating tablet Take 1 tablet (4 mg total) by mouth every 8 (eight) hours as needed for nausea or vomiting. 12/02/19  Yes Sherwood Gambler, MD  RESTASIS 0.05 % ophthalmic emulsion Place 1 drop into both eyes 2 (two) times daily as needed (for dry/irritated eyes).  12/29/17  Yes [provider]  rosuvastatin (CRESTOR) 40 MG tablet Take 40 mg by mouth daily.   Yes [provider]  traZODone (DESYREL) 50 MG tablet Take 50 mg by mouth at bedtime.   Yes [provider]  clonazePAM (KLONOPIN) 1 MG tablet Take one tab at night before dialysis day and 2nd as needed for severe anxiety. 08/15/20   Arfeen, Arlyce Harman, MD  DULoxetine (CYMBALTA) 60 MG capsule Take 1 capsule  (60 mg total) by mouth daily. 08/15/20   [name], MD   Current Facility-Administered Medications  Medication Dose Route Frequency Provider Last Rate Last Admin  . acetaminophen (TYLENOL) tablet 650 mg  650 mg Oral Q6H PRN Etta Quill, DO       Or  . acetaminophen (TYLENOL) suppository 650 mg  650 mg Rectal Q6H PRN Etta Quill, DO      . aspirin EC tablet 81 mg  81 mg Oral Daily Jennette Kettle M, DO   81 mg at 08/16/20 0944  . Chlorhexidine Gluconate Cloth 2 % PADS 6 each  6 each Topical Daily Vann, Jessica U, DO      . [START ON 08/17/2020] Chlorhexidine Gluconate Cloth 2 % PADS 6 each  6 each Topical Q0600 Penninger, [place], [place]      . cholecalciferol (VITAMIN D3) tablet 5,000 Units  5,000 Units Oral Daily Jennette Kettle M, DO   5,000 Units at 08/16/20 1000  . clonazePAM (KLONOPIN) tablet 1 mg  1 mg Oral Daily PRN Etta Quill, DO      . cycloSPORINE (RESTASIS) 0.05 % ophthalmic emulsion 1 drop  1 drop Both Eyes BID PRN Etta Quill, DO      . dorzolamide-timolol (COSOPT) 22.3-6.8 MG/ML ophthalmic solution 1 drop  1 drop Both Eyes BID Etta Quill, DO   1 drop at 08/16/20 0957  . DULoxetine (CYMBALTA) DR capsule 60 mg  60 mg Oral Daily Etta Quill, DO   60 mg at 08/16/20 0946  . enoxaparin (LOVENOX) injection 30 mg  30 mg Subcutaneous Q24H Jennette Kettle M, DO   30 mg at 08/15/20 2252  . ferric citrate (AURYXIA) tablet 420 mg  420 mg Oral TID WC Gareth Morgan, MD   420 mg at 08/16/20 1310   And  . ferric citrate (AURYXIA) tablet 210 mg  210 mg Oral With snacks Gareth Morgan, MD   210 mg at 08/16/20 0945  . insulin aspart (novoLOG) injection 0-15 Units  0-15 Units Subcutaneous TID WC Etta Quill, DO   3 Units at 08/16/20 1237  . insulin aspart (novoLOG) injection 3 Units  3 Units Subcutaneous TID WC Etta Quill, DO   3 Units at 08/16/20 1238  . insulin glargine (LANTUS) injection 25 Units  25 Units Subcutaneous Daily Vann, Jessica U, DO      .  levothyroxine (SYNTHROID) tablet 150 mcg  150 mcg Oral QAC breakfast Etta Quill, DO   150  mcg at 08/16/20 [id number]  . lisinopril (ZESTRIL) tablet 40 mg  40 mg Oral Daily Vann, Jessica U, DO      . multivitamin (RENA-VIT) tablet 1 tablet  1 tablet Oral QHS Etta Quill, DO   1 tablet at 08/15/20 2247  . ondansetron (ZOFRAN) tablet 4 mg  4 mg Oral Q6H PRN Etta Quill, DO       Or  . ondansetron [facility]) injection 4 mg  4 mg Intravenous Q6H PRN Etta Quill, DO      . rosuvastatin (CRESTOR) tablet 40 mg  40 mg Oral Daily Jennette Kettle M, DO   40 mg at 08/16/20 0946  . traZODone (DESYREL) tablet 50 mg  50 mg Oral QHS Jennette Kettle M, DO   50 mg at 08/14/20 2345   Labs: Basic Metabolic Panel: Recent Labs  Lab 08/14/20 1648 08/15/20 0328  NA 136 136  K 3.5 3.7  CL 91* 92*  CO2 31 31  GLUCOSE 125* 67*  BUN 15 25*  CREATININE 3.31* 5.16*  CALCIUM 7.8* 7.4*   Liver Function Tests: Recent Labs  Lab 08/14/20 1648 08/15/20 0328  AST 19 19  ALT 19 17  ALKPHOS 69 54  BILITOT 0.7 1.1  PROT 8.0 6.4*  ALBUMIN 3.5 2.8*   CBC: Recent Labs  Lab 08/14/20 1648 08/15/20 0328 08/16/20 1204  WBC 15.0* 10.3 10.4  NEUTROABS 13.6*  --   --   HGB 10.5* 9.3* 8.7*  HCT 32.9* 29.2* 26.9*  MCV 97.3 98.0 96.1  PLT 329 283 263   CBG: Recent Labs  Lab 08/15/20 2231 08/16/20 0612 08/16/20 0737 08/16/20 1104 08/16/20 1224  GLUCAP 117* 62* 123* 160* 198*   Studies/Results: US RENAL  Result Date: 08/15/2020 CLINICAL DATA:  Pyelonephritis EXAM: RENAL / URINARY TRACT ULTRASOUND COMPLETE COMPARISON:  CT renal colic [id number] FINDINGS: Right Kidney: Renal measurements: 10.5 x 4.7 x 3.5 cm = volume: 91 mL. Echogenicity is within normal limits. No concerning renal mass, shadowing calculus or hydronephrosis. Left Kidney: Renal measurements: 11.9 x 4.8 x 4.5 cm = volume: 133 mL. Echogenicity is within normal limits. No concerning renal mass, shadowing calculus or hydronephrosis.  Bladder: Urinary bladder is partially decompressed at the time of exam. Mild wall thickening may be related to this underdistention though should correlate with urinalysis. Other: None. IMPRESSION: 1. Unremarkable sonographic appearance of the kidneys. Please note, pyelonephritis may be sonographically occult. 2. Urinary bladder is partially decompressed at the time of exam. Mild wall thickening may be related to this underdistention though should correlate with urinalysis. Electronically Signed   By: Lovena Le M.D.   On: 08/15/2020 00:14   DG Chest Portable 1 View  Result Date: 08/14/2020 CLINICAL DATA:  Fever. EXAM: PORTABLE CHEST 1 VIEW COMPARISON:  08/31/2018 FINDINGS: Normal heart size and mediastinal contours. Subsegmental atelectasis at the left lung base. No confluent consolidation. No pulmonary edema, pleural effusion or pneumothorax. Remote left rib fracture. No acute osseous abnormalities are seen. IMPRESSION: Subsegmental atelectasis at the left lung base. No focal consolidation. Electronically Signed   By: Keith Rake M.D.   On: 08/14/2020 19:55   DG Foot Complete Right  Result Date: 08/15/2020 CLINICAL DATA:  Fever.  History of right foot amputation EXAM: RIGHT FOOT COMPLETE - 3+ VIEW COMPARISON:  07/08/2015 FINDINGS: No fracture or dislocation. 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 Resection margins appear well corticated. No bony erosion or periostitis. Soft tissue thickening at the distal stump. No appreciable  soft tissue ulceration. No soft tissue gas. IMPRESSION: 1. Soft tissue thickening at the distal stump. No soft tissue gas. No radiographic evidence of osteomyelitis. 2. 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 Electronically Signed   By: Davina Poke D.O.   On: 08/15/2020 15:49    ROS: All others negative except those listed in HPI.  Physical Exam: Vitals:   08/15/20 1645 08/16/20 0055  08/16/20 0540 08/16/20 1225  BP: (!) 171/75 (!) 166/85 (!) 143/86 (!) 158/79  Pulse: 80 82 84 79  Resp: '16 18 18 16  '$ Temp: 99.1 F (37.3 C) 98.5 F (36.9 C) 99.1 F (37.3 C) 98.6 F (37 C)  TempSrc: Oral Oral Oral Oral  SpO2: 100% 100% 95% 98%  Weight:      Height:         General: WDWN female in NAD Head: NCAT sclera not icteric MMM Neck: Supple. No JVD. Lungs: CTA bilaterally. No wheeze, rales or rhonchi. Breathing is unlabored. Heart: RRR. No murmur, rubs or gallops.  Abdomen: soft, nontender, +BS, no guarding, no rebound tenderness Lower extremities: no edema b/l, R TMA Neuro: AAOx3. Moves all extremities spontaneously. Psych:  Responds to questions appropriately with a normal affect. Dialysis Access: LU AVF +b/t  Dialysis Orders:  MWF - [facility]  4hrs, BFR 400, DFR 800,  EDW 63.5kg, 2K/ 3Ca  Access: LU AVF  Heparin 3500 Mircera 30 mcg q2wks - last 08/14/20 Venofer 51mg qwk  Assessment/Plan: 1.  Fever/chills - resolved. etiology unclear, BC NGTD, UC with insufficient growth, CXR with no signs of infection.  X ray R TMA with no signs of infection.  ABX started and now stopped. Per primary.  2. Back pain - resolved.  3.  ESRD -  On HD MWF.  Orders written for HD today per regular schedule. Truncated HD due to high patient census/covid pandemic/staffing issues.  4.  Hypertension/volume  - BP trending up, lisinopril was held but now restarted. Does not appear volume overloaded. Plan for UF to dry weight. 5.  Anemia of CKD - Hgb 8.7. ESA recently dosed. Follow trends.  6.  Secondary Hyperparathyroidism -  CCa at goal. Check phos. Continue binders. Not on VDRA. 7.  Nutrition - Renal diet w/fluid restrictions.  8. Diabetes - on insulin, per primary. 9. Depression  LJen Mow PA-C [facility] 08/16/2020, 2:06 PM   I have seen and examined this patient and agree with plan and assessment in the above note with renal recommendations/intervention highlighted.   Pt feeling a little volume overloaded after receiving IVF's which were stopped.  No sob.  Plan for HD later today. JBroadus JohnA Saydie Gerdts,MD 08/16/2020 4:01 PM

## 2020-08-16 NOTE — Progress Notes (Signed)
Vivia Ewing, RN called and made aware the Pt's HD tx has been moved to 08/17/2020.

## 2020-08-16 NOTE — Progress Notes (Signed)
Progress Note    Suzanne Allen  [id number] DOB: Apr 21, 1961  DOA: 08/14/2020 PCP: Mindi Curling, PA-C    Brief Narrative:     Medical records reviewed and are as summarized below:  Suzanne Allen is an 60 y.o. female with medical history significant of DM1, ESRD.  Pt presents to ED with c/o fever. Pt woke up this AM with chills.  Symptoms worsened throughout the day, developed back pain.  Back pain is B, constant, severe.  No radiation.  Symptoms worsened while at dialysis.  Assessment/Plan:   Principal Problem:   SIRS (systemic inflammatory response syndrome) (HCC) Active Problems:   Diabetes type 1, uncontrolled (HCC)   Benign essential HTN   ESRD on dialysis (HCC)   SIRS - no source (sepsis ruled out) -initially suspected urinary source but culture unremarkable -renal u/s virtually unremarkable -denies diarrhea/adbominal pain -had back pain initially but none since the ER-- could consider MRI of lumbar spine but again pain resolved -Got empiric zosyn / lzd in ED-- have held abx as not sure what is being treated -WBC count normalized -x ray of foot w/o osteo -sed rate elevated but lower than prior 128-->102 (2018) -procalcitonin elevated-- recheck in AM  DM1 - -lower dose of lantus due to hypoglycemia  HTN - -resume home meds  ESRD - -due for HD 2/4    Family Communication/Anticipated D/C date and plan/Code Status   DVT prophylaxis: Lovenox ordered. Code Status: Full Code.   Disposition Plan: Status is: Observation  The patient will require care spanning > 2 midnights and should be moved to inpatient because: IV treatments appropriate due to intensity of illness or inability to take PO  Dispo: The patient is from: Home              Anticipated d/c is to: Home              Anticipated d/c date is: 2 days              Patient currently is not medically stable to d/c.   Difficult to place patient No         Medical Consultants:    Renal      Subjective:   Asking about when she will go to HD  Objective:    Vitals:   08/15/20 1645 08/16/20 0055 08/16/20 0540 08/16/20 1225  BP: (!) 171/75 (!) 166/85 (!) 143/86 (!) 158/79  Pulse: 80 82 84 79  Resp: '16 18 18 16  '$ Temp: 99.1 F (37.3 C) 98.5 F (36.9 C) 99.1 F (37.3 C) 98.6 F (37 C)  TempSrc: Oral Oral Oral Oral  SpO2: 100% 100% 95% 98%  Weight:      Height:        Intake/Output Summary (Last 24 hours) at 08/16/2020 1551 Last data filed at 08/16/2020 0100 Gross per 24 hour  Intake 1313.86 ml  Output -  Net 1313.86 ml   Filed Weights   08/14/20 1626  Weight: 67.1 kg    Exam:  General: Appearance:     Overweight female in no acute distress     Lungs:     Clear to auscultation bilaterally, respirations unlabored  Heart:    Normal heart rate. Normal rhythm. No murmurs, rubs, or gallops.   MS:   Amputation of right foot is noted.      Neurologic:   Awake, alert, oriented x 3. No apparent focal neurological   defect.     Data  Reviewed:   I have personally reviewed following labs and imaging studies:  Labs: Labs show the following:   Basic Metabolic Panel: Recent Labs  Lab 08/14/20 1648 08/15/20 0328  NA 136 136  K 3.5 3.7  CL 91* 92*  CO2 31 31  GLUCOSE 125* 67*  BUN 15 25*  CREATININE 3.31* 5.16*  CALCIUM 7.8* 7.4*   GFR Estimated Creatinine Clearance: 10.9 mL/min (A) (by C-G formula based on SCr of 5.16 mg/dL (H)). Liver Function Tests: Recent Labs  Lab 08/14/20 1648 08/15/20 0328  AST 19 19  ALT 19 17  ALKPHOS 69 54  BILITOT 0.7 1.1  PROT 8.0 6.4*  ALBUMIN 3.5 2.8*   No results for input(s): LIPASE, AMYLASE in the last 168 hours. No results for input(s): AMMONIA in the last 168 hours. Coagulation profile No results for input(s): INR, PROTIME in the last 168 hours.  CBC: Recent Labs  Lab 08/14/20 1648 08/15/20 0328 08/16/20 1204  WBC 15.0* 10.3 10.4  NEUTROABS 13.6*  --   --   HGB 10.5* 9.3* 8.7*  HCT 32.9*  29.2* 26.9*  MCV 97.3 98.0 96.1  PLT 329 283 263   Cardiac Enzymes: No results for input(s): CKTOTAL, CKMB, CKMBINDEX, TROPONINI in the last 168 hours. BNP (last 3 results) No results for input(s): PROBNP in the last 8760 hours. CBG: Recent Labs  Lab 08/15/20 2231 08/16/20 0612 08/16/20 0737 08/16/20 1104 08/16/20 1224  GLUCAP 117* 62* 123* 160* 198*   D-Dimer: No results for input(s): DDIMER in the last 72 hours. Hgb A1c: Recent Labs    08/14/20 2317  HGBA1C 6.7*   Lipid Profile: No results for input(s): CHOL, HDL, LDLCALC, TRIG, CHOLHDL, LDLDIRECT in the last 72 hours. Thyroid function studies: No results for input(s): TSH, T4TOTAL, T3FREE, THYROIDAB in the last 72 hours.  Invalid input(s): FREET3 Anemia work up: No results for input(s): VITAMINB12, FOLATE, FERRITIN, TIBC, IRON, RETICCTPCT in the last 72 hours. Sepsis Labs: Recent Labs  Lab 08/14/20 1646 08/14/20 1648 08/15/20 0328 08/16/20 1204  PROCALCITON  --   --   --  1.41  WBC  --  15.0* 10.3 10.4  LATICACIDVEN 1.4  --   --   --     Microbiology Recent Results (from the past 240 hour(s))  Blood culture (routine x 2)     Status: None (Preliminary result)   Collection Time: 08/14/20  7:54 PM   Specimen: BLOOD  Result Value Ref Range Status   Specimen Description BLOOD SITE NOT SPECIFIED  Final   Special Requests   Final    BOTTLES DRAWN AEROBIC AND ANAEROBIC Blood Culture results may not be optimal due to an excessive volume of blood received in culture bottles   Culture   Final    NO GROWTH 2 DAYS Performed at [facility], [place] [address], [place], [place] [address]    Report Status PENDING  Incomplete  SARS Coronavirus 2 by RT PCR (hospital order, performed in [facility] hospital lab) Nasopharyngeal Nasopharyngeal Swab     Status: None   Collection Time: 08/14/20  8:00 PM   Specimen: Nasopharyngeal Swab  Result Value Ref Range Status   SARS Coronavirus 2 NEGATIVE NEGATIVE Final     Comment: (NOTE) SARS-CoV-2 target nucleic acids are NOT DETECTED.  The SARS-CoV-2 RNA is generally detectable in upper and lower respiratory specimens during the acute phase of infection. The lowest concentration of SARS-CoV-2 viral copies this assay can detect is 250 copies / mL.  A negative result does not preclude SARS-CoV-2 infection and should not be used as the sole basis for treatment or other patient management decisions.  A negative result may occur with improper specimen collection / handling, submission of specimen other than nasopharyngeal swab, presence of viral mutation(s) within the areas targeted by this assay, and inadequate number of viral copies (<250 copies / mL). A negative result must be combined with clinical observations, patient history, and epidemiological information.  Fact Sheet for Patients:   StrictlyIdeas.no  Fact Sheet for Healthcare Providers: BankingDealers.co.za  This test is not yet approved or  cleared by the [place] FDA and has been authorized for detection and/or diagnosis of SARS-CoV-2 by FDA under an Emergency Use Authorization (EUA).  This EUA will remain in effect (meaning this test can be used) for the duration of the COVID-19 declaration under Section 564(b)(1) of the Act, 21 U.S.C. section 360bbb-3(b)(1), unless the authorization is terminated or revoked sooner.  Performed at [facility], [place] [address], [place], [place] [address]   Urine culture     Status: Abnormal   Collection Time: 08/15/20  7:29 AM   Specimen: Urine, Random  Result Value Ref Range Status   Specimen Description URINE, RANDOM  Final   Special Requests NONE  Final   Culture (A)  Final    <10,000 COLONIES/mL INSIGNIFICANT GROWTH Performed at [facility], [place] [address] [place], [place] [address]    Report Status 08/16/2020 FINAL  Final    Procedures and diagnostic studies:  US RENAL  Result  Date: 08/15/2020 CLINICAL DATA:  Pyelonephritis EXAM: RENAL / URINARY TRACT ULTRASOUND COMPLETE COMPARISON:  CT renal colic [id number] FINDINGS: Right Kidney: Renal measurements: 10.5 x 4.7 x 3.5 cm = volume: 91 mL. Echogenicity is within normal limits. No concerning renal mass, shadowing calculus or hydronephrosis. Left Kidney: Renal measurements: 11.9 x 4.8 x 4.5 cm = volume: 133 mL. Echogenicity is within normal limits. No concerning renal mass, shadowing calculus or hydronephrosis. Bladder: Urinary bladder is partially decompressed at the time of exam. Mild wall thickening may be related to this underdistention though should correlate with urinalysis. Other: None. IMPRESSION: 1. Unremarkable sonographic appearance of the kidneys. Please note, pyelonephritis may be sonographically occult. 2. Urinary bladder is partially decompressed at the time of exam. Mild wall thickening may be related to this underdistention though should correlate with urinalysis. Electronically Signed   By: Lovena Le M.D.   On: 08/15/2020 00:14   DG Chest Portable 1 View  Result Date: 08/14/2020 CLINICAL DATA:  Fever. EXAM: PORTABLE CHEST 1 VIEW COMPARISON:  08/31/2018 FINDINGS: Normal heart size and mediastinal contours. Subsegmental atelectasis at the left lung base. No confluent consolidation. No pulmonary edema, pleural effusion or pneumothorax. Remote left rib fracture. No acute osseous abnormalities are seen. IMPRESSION: Subsegmental atelectasis at the left lung base. No focal consolidation. Electronically Signed   By: Keith Rake M.D.   On: 08/14/2020 19:55   DG Foot Complete Right  Result Date: 08/15/2020 CLINICAL DATA:  Fever.  History of right foot amputation EXAM: RIGHT FOOT COMPLETE - 3+ VIEW COMPARISON:  07/08/2015 FINDINGS: No fracture or dislocation. 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 Resection margins appear well corticated. No bony erosion or periostitis. Soft tissue  thickening at the distal stump. No appreciable soft tissue ulceration. No soft tissue gas. IMPRESSION: 1. Soft tissue thickening at the distal stump. No soft tissue gas. No radiographic evidence of osteomyelitis. 2. Postsurgical changes from  transmetatarsal amputation of the first-fifth rays. Electronically Signed   By: Davina Poke D.O.   On: 08/15/2020 15:49    Medications:   . aspirin EC  81 mg Oral Daily  . [START ON 08/17/2020] atorvastatin  80 mg Oral Daily  . Chlorhexidine Gluconate Cloth  6 each Topical Daily  . [START ON 08/17/2020] Chlorhexidine Gluconate Cloth  6 each Topical Q0600  . cholecalciferol  5,000 Units Oral Daily  . dorzolamide-timolol  1 drop Both Eyes BID  . DULoxetine  60 mg Oral Daily  . enoxaparin (LOVENOX) injection  30 mg Subcutaneous Q24H  . ferric citrate  420 mg Oral TID WC   And  . ferric citrate  210 mg Oral With snacks  . insulin aspart  0-15 Units Subcutaneous TID WC  . insulin aspart  3 Units Subcutaneous TID WC  . insulin glargine  25 Units Subcutaneous Daily  . levothyroxine  150 mcg Oral QAC breakfast  . lisinopril  40 mg Oral Daily  . multivitamin  1 tablet Oral QHS  . traZODone  50 mg Oral QHS   Continuous Infusions:    LOS: 1 day   Geradine Girt  [facility]   How to contact the [facility] Attending or Consulting provider [place] or covering provider during after hours [place], for this patient?  1. Check the care team in [facility] and look for a) attending/consulting [facility] provider listed and b) the [facility] team listed 2. Log into [email or web address] and use [place]'s universal password to access. If you do not have the password, please contact the hospital operator. 3. Locate the [facility] provider you are looking for under [facility] and page to a number that you can be directly reached. 4. If you still have difficulty reaching the provider, please page the [facility] (Director on Call) for the Hospitalists listed on amion for assistance.  08/16/2020,  3:51 PM

## 2020-08-17 DIAGNOSIS — R6510 Systemic inflammatory response syndrome (SIRS) of non-infectious origin without acute organ dysfunction: Secondary | ICD-10-CM | POA: Diagnosis not present

## 2020-08-17 LAB — RENAL FUNCTION PANEL
Albumin: 2.6 g/dL — ABNORMAL LOW (ref 3.5–5.0)
Anion gap: 17 — ABNORMAL HIGH (ref 5–15)
BUN: 64 mg/dL — ABNORMAL HIGH (ref 6–20)
CO2: 25 mmol/L (ref 22–32)
Calcium: 7 mg/dL — ABNORMAL LOW (ref 8.9–10.3)
Chloride: 93 mmol/L — ABNORMAL LOW (ref 98–111)
Creatinine, Ser: 9.83 mg/dL — ABNORMAL HIGH (ref 0.44–1.00)
GFR, Estimated: 4 mL/min — ABNORMAL LOW (ref >60)
Glucose, Bld: 125 mg/dL — ABNORMAL HIGH (ref 70–99)
Phosphorus: 7.9 mg/dL — ABNORMAL HIGH (ref 2.5–4.6)
Potassium: 3.8 mmol/L (ref 3.5–5.1)
Sodium: 135 mmol/L (ref 135–145)

## 2020-08-17 LAB — CBC
HCT: 27.9 % — ABNORMAL LOW (ref 36.0–46.0)
Hemoglobin: 8.7 g/dL — ABNORMAL LOW (ref 12.0–15.0)
MCH: 30.4 pg (ref 26.0–34.0)
MCHC: 31.2 g/dL (ref 30.0–36.0)
MCV: 97.6 fL (ref 80.0–100.0)
Platelets: 277 10*3/uL (ref 150–400)
RBC: 2.86 MIL/uL — ABNORMAL LOW (ref 3.87–5.11)
RDW: 13.4 % (ref 11.5–15.5)
WBC: 8.2 10*3/uL (ref 4.0–10.5)
nRBC: 0 % (ref 0.0–0.2)

## 2020-08-17 LAB — MRSA PCR SCREENING: MRSA by PCR: NEGATIVE

## 2020-08-17 LAB — GLUCOSE, CAPILLARY
Glucose-Capillary: 83 mg/dL (ref 70–99)
Glucose-Capillary: 96 mg/dL (ref 70–99)
Glucose-Capillary: 216 mg/dL — ABNORMAL HIGH (ref 70–99)

## 2020-08-17 LAB — PROCALCITONIN: Procalcitonin: 1.49 ng/mL

## 2020-08-17 NOTE — Discharge Summary (Signed)
Physician Discharge Summary  Suzanne Allen [id number] DOB: Sep 01, 1960 DOA: 08/14/2020  PCP: Mindi Curling, PA-C  Admit date: 08/14/2020 Discharge date: 08/17/2020  Admitted From: Home  Disposition:  Home  Recommendations for Outpatient Follow-up:  1. Follow up with PCP in 1 weeks, needs continued BP management   Home Health: No Equipment/Devices: No  Discharge Condition: Stable CODE STATUS: Full Diet recommendation: Carb mod, low sodium  Brief/Interim Summary:  Summary: She has had no recurrent fever or chills with blood culture with no growth x 3 days, urine culture with only 10K colonies and the patient asymptomatic with no dysuria or suprapubic pain (she still makes urine). She has no cough or shortness of breath with CXR negative for consolidation. Nasal swab for COVID negative. She was treated with empiric antibiotics, Zosyn and linezolid until the morning of 2/4. She will not continue antibiotics after discharge. Her procalcitonin is elevated but this could be secondary to her recent right TMA which does not appears infected. She had IP HD today (UF to dry weight) and will resume her MWF outpatient HD. She was advised to follow up with her PCP for ongoing BP management. She will continue lisinopril home dose after her discharge.   Please see below for further details of this hospital course prior to today:   Suzanne Allen is an 60 y.o. female with medical history significant ofDM1, ESRD. Pt presents to ED with c/o fever. Pt woke up this AM with chills. Symptoms worsened throughout the day, developed back pain. Back pain is B, constant, severe. No radiation. Symptoms worsened while at dialysis.  Assessment/Plan:   Principal Problem:   SIRS (systemic inflammatory response syndrome) (HCC) Active Problems:   Diabetes type 1, uncontrolled (HCC)   Benign essential HTN   ESRD on dialysis (HCC)   SIRS - no source (sepsis ruled out) -initially suspected urinary source  but culture unremarkable -renal u/s virtually unremarkable -denies diarrhea/adbominal pain -had back pain initially but none since the ER-- could consider MRI of lumbar spine but again pain resolved -Got empiric zosyn / lzd in ED-- have held abx as not sure what is being treated -WBC count normalized -x ray of foot w/o osteo -sed rate elevated but lower than prior 128-->102 (2018) -procalcitonin elevated-- recheck in AM  DM1 - -lower dose of lantus due to hypoglycemia  HTN - -resume home meds  ESRD - -due for HD 2/4    Discharge Diagnoses:  Principal Problem:   SIRS (systemic inflammatory response syndrome) ([place]) Active Problems:   Diabetes type 1, uncontrolled ([place])   Benign essential HTN   ESRD on dialysis [facility])    Discharge Instructions  Discharge Instructions    Diet - low sodium heart healthy   Complete by: As directed    Increase activity slowly   Complete by: As directed    No wound care   Complete by: As directed      Allergies as of 08/17/2020      Reactions   Erythromycin Anaphylaxis   Isosorbide    Norvasc [amlodipine] Shortness Of Breath, Swelling   Vancomycin Shortness Of Breath, Other (See Comments)   Red Man Syndrome   Sulfa Antibiotics Other (See Comments)   Nsaids Other (See Comments)   Avoid due to CKD   Wellbutrin [bupropion] Other (See Comments)   Extreme neurological effects       Medication List    TAKE these medications   aspirin EC 81 MG tablet Take 81 mg by mouth  daily. Swallow whole.   Auryxia 1 GM 210 MG(Fe) tablet Generic drug: ferric citrate Take 420 mg by mouth See admin instructions. 2-3  tabs with meals, 1 tab with snacks   clonazePAM 1 MG tablet Commonly known as: KLONOPIN Take one tab at night before dialysis day and 2nd as needed for severe anxiety.   dorzolamide-timolol 22.3-6.8 MG/ML ophthalmic solution Commonly known as: COSOPT Place 1 drop into both eyes 2 (two) times daily.   DULoxetine 60 MG  capsule Commonly known as: CYMBALTA Take 1 capsule (60 mg total) by mouth daily.   glucose blood test strip one strip (1 each total) by Other route 4 (four) times daily. Use as directed. Pharmacy, please dispense this brand of blood glucose test strips: Bayer Contour Next   insulin glargine 100 UNIT/ML injection Commonly known as: LANTUS Inject 40 Units into the skin 2 (two) times daily.   insulin lispro 100 UNIT/ML injection Commonly known as: HUMALOG Inject 20 Units into the skin See admin instructions. Inject up to 20 units subcutaneously three times daily before meals if needed Per sliding scale as directed   levothyroxine 150 MCG tablet Commonly known as: SYNTHROID Take 150 mcg by mouth daily before breakfast.   lidocaine-prilocaine cream Commonly known as: EMLA APPLY SMALL AMOUNT TO ACCESS SITE (AVF) 3 TIMES A WEEK 1 HOUR BEFORE DIALYSIS. COVER WITH OCCLUSIVE DRESSING (SARAN WRAP)   lisinopril 20 MG tablet Commonly known as: ZESTRIL Take 20 mg by mouth at bedtime.   multivitamin Tabs tablet Take 1 tablet by mouth at bedtime.   ondansetron 4 MG disintegrating tablet Commonly known as: Zofran ODT Take 1 tablet (4 mg total) by mouth every 8 (eight) hours as needed for nausea or vomiting.   Restasis 0.05 % ophthalmic emulsion Generic drug: cycloSPORINE Place 1 drop into both eyes 2 (two) times daily as needed (for dry/irritated eyes).   rosuvastatin 40 MG tablet Commonly known as: CRESTOR Take 40 mg by mouth daily.   traZODone 50 MG tablet Commonly known as: DESYREL Take 50 mg by mouth at bedtime.   VITAMIN C GUMMIE PO Take 2 tablets by mouth daily.   Vitamin D-3 125 MCG (5000 UT) Tabs Take 5,000 Units by mouth daily.       Follow-up Information    Mindi Curling, PA-C. Schedule an appointment as soon as possible for a visit in 2 day(s).   Specialty: Physician Assistant Contact information: [address] [place]  [id number] [phone]              Allergies  Allergen Reactions  . Erythromycin Anaphylaxis  . Isosorbide   . Norvasc [Amlodipine] Shortness Of Breath and Swelling  . Vancomycin Shortness Of Breath and Other (See Comments)    Red Man Syndrome  . Sulfa Antibiotics Other (See Comments)  . Nsaids Other (See Comments)    Avoid due to CKD  . Wellbutrin [Bupropion] Other (See Comments)    Extreme neurological effects     Consultations:  Nephrology    Procedures/Studies: US RENAL  Result Date: 08/15/2020 CLINICAL DATA:  Pyelonephritis EXAM: RENAL / URINARY TRACT ULTRASOUND COMPLETE COMPARISON:  CT renal colic [id number] FINDINGS: Right Kidney: Renal measurements: 10.5 x 4.7 x 3.5 cm = volume: 91 mL. Echogenicity is within normal limits. No concerning renal mass, shadowing calculus or hydronephrosis. Left Kidney: Renal measurements: 11.9 x 4.8 x 4.5 cm = volume: 133 mL. Echogenicity is within normal limits. No concerning renal mass, shadowing calculus or hydronephrosis.  Bladder: Urinary bladder is partially decompressed at the time of exam. Mild wall thickening may be related to this underdistention though should correlate with urinalysis. Other: None. IMPRESSION: 1. Unremarkable sonographic appearance of the kidneys. Please note, pyelonephritis may be sonographically occult. 2. Urinary bladder is partially decompressed at the time of exam. Mild wall thickening may be related to this underdistention though should correlate with urinalysis. Electronically Signed   By: Lovena Le M.D.   On: 08/15/2020 00:14   DG Chest Portable 1 View  Result Date: 08/14/2020 CLINICAL DATA:  Fever. EXAM: PORTABLE CHEST 1 VIEW COMPARISON:  08/31/2018 FINDINGS: Normal heart size and mediastinal contours. Subsegmental atelectasis at the left lung base. No confluent consolidation. No pulmonary edema, pleural effusion or pneumothorax. Remote left rib fracture. No acute osseous abnormalities are seen.  IMPRESSION: Subsegmental atelectasis at the left lung base. No focal consolidation. Electronically Signed   By: Keith Rake M.D.   On: 08/14/2020 19:55   DG Foot Complete Right  Result Date: 08/15/2020 CLINICAL DATA:  Fever.  History of right foot amputation EXAM: RIGHT FOOT COMPLETE - 3+ VIEW COMPARISON:  07/08/2015 FINDINGS: No fracture or dislocation. 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 Resection margins appear well corticated. No bony erosion or periostitis. Soft tissue thickening at the distal stump. No appreciable soft tissue ulceration. No soft tissue gas. IMPRESSION: 1. Soft tissue thickening at the distal stump. No soft tissue gas. No radiographic evidence of osteomyelitis. 2. 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 Electronically Signed   By: Davina Poke D.O.   On: 08/15/2020 15:49       Subjective: She reports feeling well, has had no fever or chills since her presentation. Denies diarrhea, nausea, vomiting, cough, rash, suprapubic pain, or dysuria.   Discharge Exam: Vitals:   08/17/20 1200 08/17/20 1554  BP: (!) 156/72 (!) 184/76  Pulse: 85 81  Resp: 18 17  Temp: 98.6 F (37 C) 99 F (37.2 C)  SpO2: 98% 97%   Vitals:   08/16/20 2320 08/17/20 0608 08/17/20 1200 08/17/20 1554  BP: (!) 162/79 (!) 160/78 (!) 156/72 (!) 184/76  Pulse: 78 76 85 81  Resp: '17 16 18 17  '$ Temp: 98.4 F (36.9 C) 98.6 F (37 C) 98.6 F (37 C) 99 F (37.2 C)  TempSrc: Oral Oral Oral Oral  SpO2: 98% 96% 98% 97%  Weight:  65.3 kg    Height:        General: Pt is alert, awake, not in acute distress Cardiovascular: RRR, S1/S2 + Respiratory: Normal rate, no cough Abdominal: Soft, NT, ND Extremities: no edema, no cyanosis    The results of significant diagnostics from this hospitalization (including imaging, microbiology, ancillary and laboratory) are listed below for reference.      Microbiology: Recent Results (from the past 240 hour(s))  Blood culture (routine x 2)     Status: None (Preliminary result)   Collection Time: 08/14/20  7:54 PM   Specimen: BLOOD  Result Value Ref Range Status   Specimen Description BLOOD SITE NOT SPECIFIED  Final   Special Requests   Final    BOTTLES DRAWN AEROBIC AND ANAEROBIC Blood Culture results may not be optimal due to an excessive volume of blood received in culture bottles   Culture   Final    NO GROWTH 3 DAYS Performed at [facility], [place] [address], [place], [place] [address]    Report Status PENDING  Incomplete  SARS Coronavirus  2 by RT PCR (hospital order, performed in [facility] hospital lab) Nasopharyngeal Nasopharyngeal Swab     Status: None   Collection Time: 08/14/20  8:00 PM   Specimen: Nasopharyngeal Swab  Result Value Ref Range Status   SARS Coronavirus 2 NEGATIVE NEGATIVE Final    Comment: (NOTE) SARS-CoV-2 target nucleic acids are NOT DETECTED.  The SARS-CoV-2 RNA is generally detectable in upper and lower respiratory specimens during the acute phase of infection. The lowest concentration of SARS-CoV-2 viral copies this assay can detect is 250 copies / mL. A negative result does not preclude SARS-CoV-2 infection and should not be used as the sole basis for treatment or other patient management decisions.  A negative result may occur with improper specimen collection / handling, submission of specimen other than nasopharyngeal swab, presence of viral mutation(s) within the areas targeted by this assay, and inadequate number of viral copies (<250 copies / mL). A negative result must be combined with clinical observations, patient history, and epidemiological information.  Fact Sheet for Patients:   StrictlyIdeas.no  Fact Sheet for Healthcare Providers: BankingDealers.co.za  This test is not yet approved or  cleared by the [place] FDA  and has been authorized for detection and/or diagnosis of SARS-CoV-2 by FDA under an Emergency Use Authorization (EUA).  This EUA will remain in effect (meaning this test can be used) for the duration of the COVID-19 declaration under Section 564(b)(1) of the Act, 21 U.S.C. section 360bbb-3(b)(1), unless the authorization is terminated or revoked sooner.  Performed at [facility], [place] [address], [place], [place] [address]   Urine culture     Status: Abnormal   Collection Time: 08/15/20  7:29 AM   Specimen: Urine, Random  Result Value Ref Range Status   Specimen Description URINE, RANDOM  Final   Special Requests NONE  Final   Culture (A)  Final    <10,000 COLONIES/mL INSIGNIFICANT GROWTH Performed at [facility], [place] [address] [place], [place] [address]    Report Status 08/16/2020 FINAL  Final  MRSA PCR Screening     Status: None   Collection Time: 08/16/20  9:48 PM   Specimen: Nasal Mucosa; Nasopharyngeal  Result Value Ref Range Status   MRSA by PCR NEGATIVE NEGATIVE Final    Comment:        The GeneXpert MRSA Assay (FDA approved for NASAL specimens only), is one component of a comprehensive MRSA colonization surveillance program. It is not intended to diagnose MRSA infection nor to guide or monitor treatment for MRSA infections. Performed at [facility], [place] [address], [place],  [address]      Labs: BNP (last 3 results) No results for input(s): BNP in the last 8760 hours. Basic Metabolic Panel: Recent Labs  Lab 08/14/20 1648 08/15/20 0328 08/16/20 1204 08/17/20 0914  NA 136 136  --  135  K 3.5 3.7  --  3.8  CL 91* 92*  --  93*  CO2 31 31  --  25  GLUCOSE 125* 67*  --  125*  BUN 15 25*  --  64*  CREATININE 3.31* 5.16*  --  9.83*  CALCIUM 7.8* 7.4*  --  7.0*  PHOS  --   --  7.1* 7.9*   Liver Function Tests: Recent Labs  Lab 08/14/20 1648 08/15/20 0328 08/17/20 0914  AST 19 19  --   ALT 19 17  --   ALKPHOS 69 54  --    BILITOT  0.7 1.1  --   PROT 8.0 6.4*  --   ALBUMIN 3.5 2.8* 2.6*   No results for input(s): LIPASE, AMYLASE in the last 168 hours. No results for input(s): AMMONIA in the last 168 hours. CBC: Recent Labs  Lab 08/14/20 1648 08/15/20 0328 08/16/20 1204 08/17/20 0913  WBC 15.0* 10.3 10.4 8.2  NEUTROABS 13.6*  --   --   --   HGB 10.5* 9.3* 8.7* 8.7*  HCT 32.9* 29.2* 26.9* 27.9*  MCV 97.3 98.0 96.1 97.6  PLT 329 283 263 277   Cardiac Enzymes: No results for input(s): CKTOTAL, CKMB, CKMBINDEX, TROPONINI in the last 168 hours. BNP: Invalid input(s): POCBNP CBG: Recent Labs  Lab 08/16/20 1224 08/16/20 1603 08/16/20 2053 08/17/20 0611 08/17/20 0756  GLUCAP 198* 102* 155* 83 96   D-Dimer No results for input(s): DDIMER in the last 72 hours. Hgb A1c Recent Labs    08/14/20 2317  HGBA1C 6.7*   Lipid Profile No results for input(s): CHOL, HDL, LDLCALC, TRIG, CHOLHDL, LDLDIRECT in the last 72 hours. Thyroid function studies No results for input(s): TSH, T4TOTAL, T3FREE, THYROIDAB in the last 72 hours.  Invalid input(s): FREET3 Anemia work up No results for input(s): VITAMINB12, FOLATE, FERRITIN, TIBC, IRON, RETICCTPCT in the last 72 hours. Urinalysis    Component Value Date/Time   COLORURINE YELLOW 08/15/2020 0729   APPEARANCEUR HAZY (A) 08/15/2020 0729   LABSPEC 1.007 08/15/2020 0729   PHURINE 8.0 08/15/2020 0729   GLUCOSEU >=500 (A) 08/15/2020 0729   HGBUR NEGATIVE 08/15/2020 0729   BILIRUBINUR NEGATIVE 08/15/2020 0729   KETONESUR NEGATIVE 08/15/2020 0729   PROTEINUR >=300 (A) 08/15/2020 0729   UROBILINOGEN 0.2 10/04/2013 0917   NITRITE NEGATIVE 08/15/2020 0729   LEUKOCYTESUR NEGATIVE 08/15/2020 0729   Sepsis Labs Invalid input(s): PROCALCITONIN,  WBC,  LACTICIDVEN Microbiology Recent Results (from the past 240 hour(s))  Blood culture (routine x 2)     Status: None (Preliminary result)   Collection Time: 08/14/20  7:54 PM   Specimen: BLOOD  Result Value  Ref Range Status   Specimen Description BLOOD SITE NOT SPECIFIED  Final   Special Requests   Final    BOTTLES DRAWN AEROBIC AND ANAEROBIC Blood Culture results may not be optimal due to an excessive volume of blood received in culture bottles   Culture   Final    NO GROWTH 3 DAYS Performed at [facility], [place] [address] [place], [place] [address]    Report Status PENDING  Incomplete  SARS Coronavirus 2 by RT PCR (hospital order, performed in [facility] hospital lab) Nasopharyngeal Nasopharyngeal Swab     Status: None   Collection Time: 08/14/20  8:00 PM   Specimen: Nasopharyngeal Swab  Result Value Ref Range Status   SARS Coronavirus 2 NEGATIVE NEGATIVE Final    Comment: (NOTE) SARS-CoV-2 target nucleic acids are NOT DETECTED.  The SARS-CoV-2 RNA is generally detectable in upper and lower respiratory specimens during the acute phase of infection. The lowest concentration of SARS-CoV-2 viral copies this assay can detect is 250 copies / mL. A negative result does not preclude SARS-CoV-2 infection and should not be used as the sole basis for treatment or other patient management decisions.  A negative result may occur with improper specimen collection / handling, submission of specimen other than nasopharyngeal swab, presence of viral mutation(s) within the areas targeted by this assay, and inadequate number of viral copies (<250 copies / mL). A negative result must be combined with  clinical observations, patient history, and epidemiological information.  Fact Sheet for Patients:   StrictlyIdeas.no  Fact Sheet for Healthcare Providers: BankingDealers.co.za  This test is not yet approved or  cleared by the [place] FDA and has been authorized for detection and/or diagnosis of SARS-CoV-2 by FDA under an Emergency Use Authorization (EUA).  This EUA will remain in effect (meaning this test can be used) for the duration of  the COVID-19 declaration under Section 564(b)(1) of the Act, 21 U.S.C. section 360bbb-3(b)(1), unless the authorization is terminated or revoked sooner.  Performed at [facility], [place] [address], [place], [place] [address]   Urine culture     Status: Abnormal   Collection Time: 08/15/20  7:29 AM   Specimen: Urine, Random  Result Value Ref Range Status   Specimen Description URINE, RANDOM  Final   Special Requests NONE  Final   Culture (A)  Final    <10,000 COLONIES/mL INSIGNIFICANT GROWTH Performed at [facility], [place] [address], [place], [place] [address]    Report Status 08/16/2020 FINAL  Final  MRSA PCR Screening     Status: None   Collection Time: 08/16/20  9:48 PM   Specimen: Nasal Mucosa; Nasopharyngeal  Result Value Ref Range Status   MRSA by PCR NEGATIVE NEGATIVE Final    Comment:        The GeneXpert MRSA Assay (FDA approved for NASAL specimens only), is one component of a comprehensive MRSA colonization surveillance program. It is not intended to diagnose MRSA infection nor to guide or monitor treatment for MRSA infections. Performed at [facility], [place] [address] [place], [place] [address]      Time coordinating discharge: Over 33 minutes  SIGNED:   Blain Pais, MD  [facility] 08/17/2020, 3:55 PM Pager   If 7PM-7AM, please contact night-coverage [email or web address] Password [email or web address]

## 2020-08-17 NOTE — Progress Notes (Signed)
Discharge Instructions reviewed and given to pt. Pt acknowledge understanding. sbp 180, pt asymptomatic and explains her bp will frequently elevate when she is anxious. Pt describes many fears and stress is multifactorial and primary concern is for her developmentally delayed daughter who is dependent on her and others for care. klonopin  1 mg offered and pt agreeable to taking prior to discharge. Pt father at bedside

## 2020-08-17 NOTE — Procedures (Signed)
I was present at this dialysis session. I have reviewed the session itself and made appropriate changes.   Vital signs in last 24 hours:  Temp:  [98.4 F (36.9 C)-98.7 F (37.1 C)] 98.6 F (37 C) (02/05 0608) Pulse Rate:  [76-81] 76 (02/05 0608) Resp:  [16-17] 16 (02/05 0608) BP: (155-162)/(75-79) 160/78 (02/05 0608) SpO2:  [96 %-100 %] 96 % (02/05 0608) Weight:  [65.3 kg] 65.3 kg (02/05 0608) Weight change:  Filed Weights   08/14/20 1626 08/17/20 0608  Weight: 67.1 kg 65.3 kg    Recent Labs  Lab 08/15/20 0328 08/16/20 1204  NA 136  --   K 3.7  --   CL 92*  --   CO2 31  --   GLUCOSE 67*  --   BUN 25*  --   CREATININE 5.16*  --   CALCIUM 7.4*  --   PHOS  --  7.1*    Recent Labs  Lab 08/14/20 1648 08/15/20 0328 08/16/20 1204  WBC 15.0* 10.3 10.4  NEUTROABS 13.6*  --   --   HGB 10.5* 9.3* 8.7*  HCT 32.9* 29.2* 26.9*  MCV 97.3 98.0 96.1  PLT 329 283 263    Scheduled Meds: . aspirin EC  81 mg Oral Daily  . atorvastatin  80 mg Oral Daily  . Chlorhexidine Gluconate Cloth  6 each Topical Daily  . Chlorhexidine Gluconate Cloth  6 each Topical Q0600  . cholecalciferol  5,000 Units Oral Daily  . dorzolamide-timolol  1 drop Both Eyes BID  . DULoxetine  60 mg Oral Daily  . enoxaparin (LOVENOX) injection  30 mg Subcutaneous Q24H  . ferric citrate  420 mg Oral TID WC   And  . ferric citrate  210 mg Oral With snacks  . insulin aspart  0-15 Units Subcutaneous TID WC  . insulin aspart  3 Units Subcutaneous TID WC  . insulin glargine  25 Units Subcutaneous Daily  . levothyroxine  150 mcg Oral QAC breakfast  . lisinopril  40 mg Oral Daily  . multivitamin  1 tablet Oral QHS  . traZODone  50 mg Oral QHS   Continuous Infusions: PRN Meds:.acetaminophen **OR** acetaminophen, clonazePAM, cycloSPORINE, ondansetron **OR** ondansetron (ZOFRAN) IV    Dialysis Orders:  MWF - [facility]  4hrs, BFR 400, DFR 800,  EDW 63.5kg, 2K/ 3Ca  Access: LU AVF  Heparin 3500 Mircera 30 mcg  q2wks - last 08/14/20 Venofer 64mg qwk  Assessment/Plan: 1.  Fever/chills - resolved. etiology unclear, BC NGTD, UC with insufficient growth, CXR with no signs of infection.  X ray R TMA with no signs of infection.  ABX started and now stopped. Per primary.  2. Back pain - resolved.  3.  ESRD -  On HD MWF.  Off schedule due to schedule.  Truncated HD due to high patient census/covid pandemic/staffing issues.  4.  Hypertension/volume  - BP trending up, lisinopril was held but now restarted. Does not appear volume overloaded. Plan for UF to dry weight. 5.  Anemia of CKD - Hgb 8.7. ESA recently dosed. Follow trends.  6.  Secondary Hyperparathyroidism -  CCa at goal. Check phos. Continue binders. Not on VDRA. 7.  Nutrition - Renal diet w/fluid restrictions.  8. Diabetes - on insulin, per primary. 9. Depression   Suzanne Potts  MD 08/17/2020, 8:08 AM

## 2020-08-18 ENCOUNTER — Telehealth: Payer: Self-pay | Admitting: Nephrology

## 2020-08-18 NOTE — Telephone Encounter (Signed)
Transition of Care Contact from [place]  Date of Discharge: 08/17/20 Date of Contact: 08/18/20 Method of contact: phone - attempted  Attempted to contact patient to discuss transition of care from inpatient admission.  Patient did not answer the phone.  Message was left on patient's voicemail informing them we would attempt to call them again and if unable to reach will follow up at dialysis.  Jen Mow, PA-C [facility] Pager: [phone]

## 2020-08-19 ENCOUNTER — Telehealth: Payer: Self-pay | Admitting: Nephrology

## 2020-08-19 LAB — CULTURE, BLOOD (ROUTINE X 2): Culture: NO GROWTH

## 2020-08-19 LAB — GLUCOSE, CAPILLARY: Glucose-Capillary: 141 mg/dL — ABNORMAL HIGH (ref 70–99)

## 2020-08-19 NOTE — Telephone Encounter (Signed)
Transition of care contact from inpatient facility  Date of Discharge: 08/17/20  Date of Contact: attempted  08/19/20  Method of contact: Phone  Attempted to contact patient to discuss transition of care from inpatient admission. Patient did not answer the phone. Message was left on the patient's voicemail with again if message if  any questions concerning recent dc  And meds  To be seen next at [facility] on MWF

## 2020-11-13 ENCOUNTER — Telehealth (HOSPITAL_COMMUNITY): Payer: Medicare Other | Admitting: Psychiatry

## 2020-11-19 ENCOUNTER — Telehealth (HOSPITAL_COMMUNITY): Payer: Medicare Other | Admitting: Psychiatry

## 2020-12-03 ENCOUNTER — Telehealth (INDEPENDENT_AMBULATORY_CARE_PROVIDER_SITE_OTHER): Payer: Medicare Other | Admitting: Psychiatry

## 2020-12-03 ENCOUNTER — Encounter (HOSPITAL_COMMUNITY): Payer: Self-pay | Admitting: Psychiatry

## 2020-12-03 ENCOUNTER — Other Ambulatory Visit: Payer: Self-pay

## 2020-12-03 VITALS — Wt 136.0 lb

## 2020-12-03 DIAGNOSIS — F4310 Post-traumatic stress disorder, unspecified: Secondary | ICD-10-CM

## 2020-12-03 DIAGNOSIS — F410 Panic disorder [episodic paroxysmal anxiety] without agoraphobia: Secondary | ICD-10-CM | POA: Diagnosis not present

## 2020-12-03 DIAGNOSIS — F330 Major depressive disorder, recurrent, mild: Secondary | ICD-10-CM

## 2020-12-03 MED ORDER — TRAZODONE HCL 50 MG PO TABS: 50.0000 mg | ORAL_TABLET | Freq: Every evening | ORAL | 0 refills | Status: DC | PRN

## 2020-12-03 MED ORDER — CLONAZEPAM 1 MG PO TABS: ORAL_TABLET | ORAL | 1 refills | Status: DC

## 2020-12-03 MED ORDER — DULOXETINE HCL 60 MG PO CPEP: 60.0000 mg | ORAL_CAPSULE | Freq: Every day | ORAL | 2 refills | Status: DC
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
## 2021-01-02 ENCOUNTER — Other Ambulatory Visit (HOSPITAL_COMMUNITY): Payer: Self-pay | Admitting: Psychiatry

## 2021-01-02 DIAGNOSIS — F4310 Post-traumatic stress disorder, unspecified: Secondary | ICD-10-CM

## 2021-02-26 ENCOUNTER — Other Ambulatory Visit (HOSPITAL_COMMUNITY): Payer: Self-pay | Admitting: Psychiatry

## 2021-02-26 DIAGNOSIS — F4310 Post-traumatic stress disorder, unspecified: Secondary | ICD-10-CM

## 2021-02-26 DIAGNOSIS — F330 Major depressive disorder, recurrent, mild: Secondary | ICD-10-CM

## 2021-03-05 ENCOUNTER — Telehealth (HOSPITAL_COMMUNITY): Payer: Medicare Other | Admitting: Psychiatry

## 2021-03-27 ENCOUNTER — Telehealth (INDEPENDENT_AMBULATORY_CARE_PROVIDER_SITE_OTHER): Payer: Medicare Other | Admitting: Psychiatry

## 2021-03-27 ENCOUNTER — Encounter (HOSPITAL_COMMUNITY): Payer: Self-pay | Admitting: Psychiatry

## 2021-03-27 ENCOUNTER — Other Ambulatory Visit: Payer: Self-pay

## 2021-03-27 DIAGNOSIS — F330 Major depressive disorder, recurrent, mild: Secondary | ICD-10-CM

## 2021-03-27 DIAGNOSIS — F4310 Post-traumatic stress disorder, unspecified: Secondary | ICD-10-CM

## 2021-03-27 DIAGNOSIS — F410 Panic disorder [episodic paroxysmal anxiety] without agoraphobia: Secondary | ICD-10-CM

## 2021-03-27 MED ORDER — CLONAZEPAM 1 MG PO TABS
ORAL_TABLET | ORAL | 2 refills | Status: DC
Start: 2021-03-27 — End: 2021-06-17

## 2021-03-27 MED ORDER — TRAZODONE HCL 50 MG PO TABS: 50.0000 mg | ORAL_TABLET | Freq: Every evening | ORAL | 0 refills | Status: DC | PRN

## 2021-03-27 MED ORDER — DULOXETINE HCL 60 MG PO CPEP
60.0000 mg | ORAL_CAPSULE | Freq: Every day | ORAL | 0 refills | Status: DC
Start: 2021-03-27 — End: 2021-06-17
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
## 2021-05-16 LAB — COLOGUARD: COLOGUARD: NEGATIVE

## 2021-06-17 ENCOUNTER — Other Ambulatory Visit: Payer: Self-pay

## 2021-06-17 ENCOUNTER — Encounter (HOSPITAL_COMMUNITY): Payer: Self-pay | Admitting: Psychiatry

## 2021-06-17 ENCOUNTER — Telehealth (HOSPITAL_BASED_OUTPATIENT_CLINIC_OR_DEPARTMENT_OTHER): Payer: Medicare Other | Admitting: Psychiatry

## 2021-06-17 DIAGNOSIS — F410 Panic disorder [episodic paroxysmal anxiety] without agoraphobia: Secondary | ICD-10-CM | POA: Diagnosis not present

## 2021-06-17 DIAGNOSIS — F4310 Post-traumatic stress disorder, unspecified: Secondary | ICD-10-CM | POA: Diagnosis not present

## 2021-06-17 DIAGNOSIS — F330 Major depressive disorder, recurrent, mild: Secondary | ICD-10-CM

## 2021-06-17 MED ORDER — DULOXETINE HCL 60 MG PO CPEP
60.0000 mg | ORAL_CAPSULE | Freq: Every day | ORAL | 0 refills | Status: DC
Start: 2021-06-17 — End: 2021-09-23

## 2021-06-17 MED ORDER — CLONAZEPAM 1 MG PO TABS: ORAL_TABLET | ORAL | 1 refills | Status: DC

## 2021-06-17 MED ORDER — TRAZODONE HCL 100 MG PO TABS: 100.0000 mg | ORAL_TABLET | Freq: Every evening | ORAL | 0 refills | Status: DC | PRN
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
## 2021-06-26 ENCOUNTER — Telehealth (HOSPITAL_COMMUNITY): Payer: Medicare Other | Admitting: Psychiatry

## 2021-07-04 ENCOUNTER — Other Ambulatory Visit (HOSPITAL_COMMUNITY): Payer: Self-pay | Admitting: Psychiatry

## 2021-07-04 DIAGNOSIS — F4310 Post-traumatic stress disorder, unspecified: Secondary | ICD-10-CM

## 2021-09-23 ENCOUNTER — Telehealth (HOSPITAL_BASED_OUTPATIENT_CLINIC_OR_DEPARTMENT_OTHER): Payer: Medicare Other | Admitting: Psychiatry

## 2021-09-23 ENCOUNTER — Encounter (HOSPITAL_COMMUNITY): Payer: Self-pay | Admitting: Psychiatry

## 2021-09-23 ENCOUNTER — Other Ambulatory Visit: Payer: Self-pay

## 2021-09-23 DIAGNOSIS — F410 Panic disorder [episodic paroxysmal anxiety] without agoraphobia: Secondary | ICD-10-CM | POA: Diagnosis not present

## 2021-09-23 DIAGNOSIS — F4310 Post-traumatic stress disorder, unspecified: Secondary | ICD-10-CM

## 2021-09-23 DIAGNOSIS — F330 Major depressive disorder, recurrent, mild: Secondary | ICD-10-CM

## 2021-09-23 MED ORDER — DULOXETINE HCL 60 MG PO CPEP: 60.0000 mg | ORAL_CAPSULE | Freq: Every day | ORAL | 0 refills | Status: DC

## 2021-09-23 MED ORDER — TRAZODONE HCL 100 MG PO TABS: 100.0000 mg | ORAL_TABLET | Freq: Every evening | ORAL | 0 refills | Status: DC | PRN

## 2021-09-23 MED ORDER — CLONAZEPAM 1 MG PO TABS: ORAL_TABLET | ORAL | 1 refills | Status: DC
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
## 2021-09-24 ENCOUNTER — Other Ambulatory Visit (HOSPITAL_COMMUNITY): Payer: Self-pay | Admitting: Psychiatry

## 2021-09-24 DIAGNOSIS — F4310 Post-traumatic stress disorder, unspecified: Secondary | ICD-10-CM

## 2021-12-23 ENCOUNTER — Encounter (HOSPITAL_COMMUNITY): Payer: Self-pay | Admitting: Psychiatry

## 2021-12-23 ENCOUNTER — Telehealth (HOSPITAL_BASED_OUTPATIENT_CLINIC_OR_DEPARTMENT_OTHER): Payer: Medicare Other | Admitting: Psychiatry

## 2021-12-23 DIAGNOSIS — F330 Major depressive disorder, recurrent, mild: Secondary | ICD-10-CM

## 2021-12-23 DIAGNOSIS — F410 Panic disorder [episodic paroxysmal anxiety] without agoraphobia: Secondary | ICD-10-CM

## 2021-12-23 DIAGNOSIS — F4310 Post-traumatic stress disorder, unspecified: Secondary | ICD-10-CM | POA: Diagnosis not present

## 2021-12-23 MED ORDER — CLONAZEPAM 1 MG PO TABS: ORAL_TABLET | ORAL | 1 refills | Status: DC

## 2021-12-23 MED ORDER — TRAZODONE HCL 100 MG PO TABS: 100.0000 mg | ORAL_TABLET | Freq: Every evening | ORAL | 0 refills | Status: DC | PRN

## 2021-12-23 MED ORDER — DULOXETINE HCL 60 MG PO CPEP: 60.0000 mg | ORAL_CAPSULE | Freq: Every day | ORAL | 0 refills | Status: DC
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
## 2022-03-24 ENCOUNTER — Telehealth (HOSPITAL_COMMUNITY): Payer: Medicare Other | Admitting: Psychiatry

## 2022-03-30 ENCOUNTER — Other Ambulatory Visit (HOSPITAL_COMMUNITY): Payer: Self-pay | Admitting: *Deleted

## 2022-03-30 DIAGNOSIS — F330 Major depressive disorder, recurrent, mild: Secondary | ICD-10-CM

## 2022-03-30 DIAGNOSIS — F4310 Post-traumatic stress disorder, unspecified: Secondary | ICD-10-CM

## 2022-03-30 MED ORDER — DULOXETINE HCL 60 MG PO CPEP: 60.0000 mg | ORAL_CAPSULE | Freq: Every day | ORAL | 0 refills | Status: DC

## 2022-04-10 ENCOUNTER — Other Ambulatory Visit (HOSPITAL_COMMUNITY): Payer: Self-pay | Admitting: Psychiatry

## 2022-04-10 ENCOUNTER — Telehealth (HOSPITAL_COMMUNITY): Payer: Medicare Other | Admitting: Psychiatry

## 2022-04-10 DIAGNOSIS — F4310 Post-traumatic stress disorder, unspecified: Secondary | ICD-10-CM

## 2022-04-13 ENCOUNTER — Telehealth (HOSPITAL_BASED_OUTPATIENT_CLINIC_OR_DEPARTMENT_OTHER): Payer: Medicare Other | Admitting: Psychiatry

## 2022-04-13 ENCOUNTER — Encounter (HOSPITAL_COMMUNITY): Payer: Self-pay | Admitting: Psychiatry

## 2022-04-13 DIAGNOSIS — F410 Panic disorder [episodic paroxysmal anxiety] without agoraphobia: Secondary | ICD-10-CM | POA: Diagnosis not present

## 2022-04-13 DIAGNOSIS — F330 Major depressive disorder, recurrent, mild: Secondary | ICD-10-CM | POA: Diagnosis not present

## 2022-04-13 DIAGNOSIS — F4310 Post-traumatic stress disorder, unspecified: Secondary | ICD-10-CM

## 2022-04-13 MED ORDER — CLONAZEPAM 1 MG PO TABS: ORAL_TABLET | ORAL | 1 refills | Status: DC

## 2022-04-13 MED ORDER — DULOXETINE HCL 60 MG PO CPEP: 60.0000 mg | ORAL_CAPSULE | Freq: Every day | ORAL | 0 refills | Status: DC

## 2022-04-13 MED ORDER — TRAZODONE HCL 100 MG PO TABS: 100.0000 mg | ORAL_TABLET | Freq: Every evening | ORAL | 0 refills | Status: DC | PRN
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
## 2022-04-15 ENCOUNTER — Encounter: Payer: Self-pay | Admitting: Internal Medicine

## 2022-04-28 ENCOUNTER — Ambulatory Visit: Payer: Medicare Other | Admitting: Internal Medicine

## 2022-07-14 ENCOUNTER — Telehealth (HOSPITAL_BASED_OUTPATIENT_CLINIC_OR_DEPARTMENT_OTHER): Payer: Medicare Other | Admitting: Psychiatry

## 2022-07-14 ENCOUNTER — Encounter (HOSPITAL_COMMUNITY): Payer: Self-pay | Admitting: Psychiatry

## 2022-07-14 DIAGNOSIS — F4310 Post-traumatic stress disorder, unspecified: Secondary | ICD-10-CM | POA: Diagnosis not present

## 2022-07-14 DIAGNOSIS — F410 Panic disorder [episodic paroxysmal anxiety] without agoraphobia: Secondary | ICD-10-CM

## 2022-07-14 DIAGNOSIS — F330 Major depressive disorder, recurrent, mild: Secondary | ICD-10-CM

## 2022-07-14 MED ORDER — DULOXETINE HCL 60 MG PO CPEP: 60.0000 mg | ORAL_CAPSULE | Freq: Every day | ORAL | 0 refills | Status: DC

## 2022-07-14 MED ORDER — CLONAZEPAM 1 MG PO TABS: ORAL_TABLET | ORAL | 1 refills | Status: DC

## 2022-07-14 MED ORDER — TRAZODONE HCL 100 MG PO TABS: 100.0000 mg | ORAL_TABLET | Freq: Every evening | ORAL | 0 refills | Status: DC | PRN
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
## 2022-10-13 ENCOUNTER — Encounter (HOSPITAL_COMMUNITY): Payer: Self-pay | Admitting: Psychiatry

## 2022-10-13 ENCOUNTER — Telehealth (HOSPITAL_BASED_OUTPATIENT_CLINIC_OR_DEPARTMENT_OTHER): Payer: 59 | Admitting: Psychiatry

## 2022-10-13 DIAGNOSIS — F410 Panic disorder [episodic paroxysmal anxiety] without agoraphobia: Secondary | ICD-10-CM

## 2022-10-13 DIAGNOSIS — F330 Major depressive disorder, recurrent, mild: Secondary | ICD-10-CM

## 2022-10-13 DIAGNOSIS — F4310 Post-traumatic stress disorder, unspecified: Secondary | ICD-10-CM | POA: Diagnosis not present

## 2022-10-13 MED ORDER — CLONAZEPAM 1 MG PO TABS: ORAL_TABLET | ORAL | 1 refills | Status: DC

## 2022-10-13 MED ORDER — DULOXETINE HCL 60 MG PO CPEP: 60.0000 mg | ORAL_CAPSULE | Freq: Every day | ORAL | 0 refills | Status: DC

## 2022-10-13 MED ORDER — TRAZODONE HCL 100 MG PO TABS: 100.0000 mg | ORAL_TABLET | Freq: Every evening | ORAL | 0 refills | Status: DC | PRN
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
## 2023-01-12 ENCOUNTER — Encounter (HOSPITAL_COMMUNITY): Payer: Self-pay | Admitting: Psychiatry

## 2023-01-12 ENCOUNTER — Telehealth (HOSPITAL_BASED_OUTPATIENT_CLINIC_OR_DEPARTMENT_OTHER): Payer: 59 | Admitting: Psychiatry

## 2023-01-12 VITALS — Wt 150.0 lb

## 2023-01-12 DIAGNOSIS — F410 Panic disorder [episodic paroxysmal anxiety] without agoraphobia: Secondary | ICD-10-CM

## 2023-01-12 DIAGNOSIS — F330 Major depressive disorder, recurrent, mild: Secondary | ICD-10-CM

## 2023-01-12 DIAGNOSIS — F4310 Post-traumatic stress disorder, unspecified: Secondary | ICD-10-CM

## 2023-01-12 MED ORDER — TRAZODONE HCL 100 MG PO TABS: 100.0000 mg | ORAL_TABLET | Freq: Every evening | ORAL | 0 refills | Status: DC | PRN

## 2023-01-12 MED ORDER — DULOXETINE HCL 30 MG PO CPEP: 30.0000 mg | ORAL_CAPSULE | Freq: Every day | ORAL | 0 refills | Status: DC

## 2023-01-12 MED ORDER — CLONAZEPAM 1 MG PO TABS: ORAL_TABLET | ORAL | 1 refills | Status: DC

## 2023-01-12 MED ORDER — DULOXETINE HCL 60 MG PO CPEP: 60.0000 mg | ORAL_CAPSULE | Freq: Every day | ORAL | 0 refills | Status: DC
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
## 2023-03-04 LAB — HM DIABETES EYE EXAM

## 2023-04-13 ENCOUNTER — Encounter (HOSPITAL_COMMUNITY): Payer: Self-pay | Admitting: Psychiatry

## 2023-04-13 ENCOUNTER — Telehealth (HOSPITAL_BASED_OUTPATIENT_CLINIC_OR_DEPARTMENT_OTHER): Payer: 59 | Admitting: Psychiatry

## 2023-04-13 VITALS — Wt 156.0 lb

## 2023-04-13 DIAGNOSIS — F330 Major depressive disorder, recurrent, mild: Secondary | ICD-10-CM | POA: Diagnosis not present

## 2023-04-13 DIAGNOSIS — F4310 Post-traumatic stress disorder, unspecified: Secondary | ICD-10-CM | POA: Diagnosis not present

## 2023-04-13 DIAGNOSIS — F410 Panic disorder [episodic paroxysmal anxiety] without agoraphobia: Secondary | ICD-10-CM

## 2023-04-13 MED ORDER — TRAZODONE HCL 100 MG PO TABS
100.0000 mg | ORAL_TABLET | Freq: Every evening | ORAL | 0 refills | Status: DC | PRN
Start: 2023-04-13 — End: 2023-07-22

## 2023-04-13 MED ORDER — DULOXETINE HCL 60 MG PO CPEP: 60.0000 mg | ORAL_CAPSULE | Freq: Every day | ORAL | 2 refills | Status: DC

## 2023-04-13 MED ORDER — CLONAZEPAM 1 MG PO TABS
ORAL_TABLET | ORAL | 1 refills | Status: DC
Start: 2023-04-13 — End: 2023-07-22
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
## 2023-04-14 ENCOUNTER — Encounter (HOSPITAL_COMMUNITY): Payer: Self-pay

## 2023-04-21 ENCOUNTER — Other Ambulatory Visit (HOSPITAL_COMMUNITY): Payer: Self-pay | Admitting: Psychiatry

## 2023-04-21 DIAGNOSIS — F410 Panic disorder [episodic paroxysmal anxiety] without agoraphobia: Secondary | ICD-10-CM

## 2023-04-21 DIAGNOSIS — F4310 Post-traumatic stress disorder, unspecified: Secondary | ICD-10-CM

## 2023-04-21 DIAGNOSIS — F330 Major depressive disorder, recurrent, mild: Secondary | ICD-10-CM

## 2023-04-27 ENCOUNTER — Encounter (HOSPITAL_COMMUNITY): Payer: Self-pay

## 2023-04-27 ENCOUNTER — Other Ambulatory Visit (HOSPITAL_COMMUNITY): Payer: Self-pay | Admitting: *Deleted

## 2023-04-27 DIAGNOSIS — F4310 Post-traumatic stress disorder, unspecified: Secondary | ICD-10-CM

## 2023-04-27 DIAGNOSIS — F410 Panic disorder [episodic paroxysmal anxiety] without agoraphobia: Secondary | ICD-10-CM

## 2023-04-27 DIAGNOSIS — F330 Major depressive disorder, recurrent, mild: Secondary | ICD-10-CM

## 2023-04-27 MED ORDER — DULOXETINE HCL 30 MG PO CPEP: 30.0000 mg | ORAL_CAPSULE | Freq: Every day | ORAL | 0 refills | Status: DC

## 2023-07-09 ENCOUNTER — Other Ambulatory Visit (HOSPITAL_COMMUNITY): Payer: Self-pay | Admitting: Psychiatry

## 2023-07-09 DIAGNOSIS — F4310 Post-traumatic stress disorder, unspecified: Secondary | ICD-10-CM

## 2023-07-15 ENCOUNTER — Telehealth (HOSPITAL_COMMUNITY): Payer: 59 | Admitting: Psychiatry

## 2023-07-22 ENCOUNTER — Telehealth (HOSPITAL_BASED_OUTPATIENT_CLINIC_OR_DEPARTMENT_OTHER): Payer: 59 | Admitting: Psychiatry

## 2023-07-22 ENCOUNTER — Encounter (HOSPITAL_COMMUNITY): Payer: Self-pay | Admitting: Psychiatry

## 2023-07-22 VITALS — Wt 156.0 lb

## 2023-07-22 DIAGNOSIS — F410 Panic disorder [episodic paroxysmal anxiety] without agoraphobia: Secondary | ICD-10-CM

## 2023-07-22 DIAGNOSIS — F330 Major depressive disorder, recurrent, mild: Secondary | ICD-10-CM | POA: Diagnosis not present

## 2023-07-22 DIAGNOSIS — F4310 Post-traumatic stress disorder, unspecified: Secondary | ICD-10-CM | POA: Diagnosis not present

## 2023-07-22 MED ORDER — DULOXETINE HCL 30 MG PO CPEP
30.0000 mg | ORAL_CAPSULE | Freq: Every day | ORAL | 0 refills | Status: DC
Start: 2023-07-22 — End: 2023-10-21

## 2023-07-22 MED ORDER — DULOXETINE HCL 60 MG PO CPEP: 60.0000 mg | ORAL_CAPSULE | Freq: Every day | ORAL | 2 refills | Status: DC

## 2023-07-22 MED ORDER — CLONAZEPAM 1 MG PO TABS: ORAL_TABLET | ORAL | 1 refills | Status: DC

## 2023-07-22 MED ORDER — TRAZODONE HCL 100 MG PO TABS: 100.0000 mg | ORAL_TABLET | Freq: Every evening | ORAL | 0 refills | Status: DC | PRN
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
## 2023-08-12 ENCOUNTER — Encounter: Payer: Self-pay | Admitting: Gastroenterology

## 2023-08-19 ENCOUNTER — Ambulatory Visit (HOSPITAL_COMMUNITY): Admission: RE | Admit: 2023-08-19 | Payer: 59 | Source: Ambulatory Visit | Admitting: Nephrology

## 2023-08-19 ENCOUNTER — Encounter (HOSPITAL_COMMUNITY): Admission: RE | Payer: Self-pay | Source: Ambulatory Visit

## 2023-08-19 SURGERY — A/V FISTULAGRAM
Anesthesia: LOCAL

## 2023-10-19 ENCOUNTER — Encounter: Payer: Self-pay | Admitting: Gastroenterology

## 2023-10-19 ENCOUNTER — Other Ambulatory Visit (INDEPENDENT_AMBULATORY_CARE_PROVIDER_SITE_OTHER)

## 2023-10-19 ENCOUNTER — Telehealth: Payer: Self-pay

## 2023-10-19 ENCOUNTER — Ambulatory Visit (INDEPENDENT_AMBULATORY_CARE_PROVIDER_SITE_OTHER): Payer: 59 | Admitting: Gastroenterology

## 2023-10-19 VITALS — BP 134/72 | HR 72 | Ht 63.0 in | Wt 152.2 lb

## 2023-10-19 DIAGNOSIS — R112 Nausea with vomiting, unspecified: Secondary | ICD-10-CM | POA: Diagnosis not present

## 2023-10-19 DIAGNOSIS — R109 Unspecified abdominal pain: Secondary | ICD-10-CM

## 2023-10-19 DIAGNOSIS — N186 End stage renal disease: Secondary | ICD-10-CM | POA: Diagnosis not present

## 2023-10-19 DIAGNOSIS — Z992 Dependence on renal dialysis: Secondary | ICD-10-CM

## 2023-10-19 DIAGNOSIS — K861 Other chronic pancreatitis: Secondary | ICD-10-CM

## 2023-10-19 DIAGNOSIS — Z1211 Encounter for screening for malignant neoplasm of colon: Secondary | ICD-10-CM | POA: Diagnosis not present

## 2023-10-19 DIAGNOSIS — Z1212 Encounter for screening for malignant neoplasm of rectum: Secondary | ICD-10-CM

## 2023-10-19 LAB — CBC WITH DIFFERENTIAL/PLATELET
Basophils Absolute: 0.1 10*3/uL (ref 0.0–0.1)
Basophils Relative: 0.7 % (ref 0.0–3.0)
Eosinophils Absolute: 0.8 10*3/uL — ABNORMAL HIGH (ref 0.0–0.7)
Eosinophils Relative: 7.5 % — ABNORMAL HIGH (ref 0.0–5.0)
HCT: 31.6 % — ABNORMAL LOW (ref 36.0–46.0)
Hemoglobin: 10.5 g/dL — ABNORMAL LOW (ref 12.0–15.0)
Lymphocytes Relative: 11.5 % — ABNORMAL LOW (ref 12.0–46.0)
Lymphs Abs: 1.2 10*3/uL (ref 0.7–4.0)
MCHC: 33.1 g/dL (ref 30.0–36.0)
MCV: 94.5 fl (ref 78.0–100.0)
Monocytes Absolute: 0.9 10*3/uL (ref 0.1–1.0)
Monocytes Relative: 8.2 % (ref 3.0–12.0)
Neutro Abs: 7.5 10*3/uL (ref 1.4–7.7)
Neutrophils Relative %: 72.1 % (ref 43.0–77.0)
Platelets: 361 10*3/uL (ref 150.0–400.0)
RBC: 3.34 Mil/uL — ABNORMAL LOW (ref 3.87–5.11)
RDW: 15.7 % — ABNORMAL HIGH (ref 11.5–15.5)
WBC: 10.4 10*3/uL (ref 4.0–10.5)

## 2023-10-19 LAB — LIPASE: Lipase: 50 U/L (ref 11.0–59.0)

## 2023-10-19 LAB — COMPREHENSIVE METABOLIC PANEL WITH GFR
ALT: 4 U/L (ref 0–35)
AST: 15 U/L (ref 0–37)
Albumin: 4.2 g/dL (ref 3.5–5.2)
Alkaline Phosphatase: 97 U/L (ref 39–117)
BUN: 34 mg/dL — ABNORMAL HIGH (ref 6–23)
CO2: 26 meq/L (ref 19–32)
Calcium: 9.5 mg/dL (ref 8.4–10.5)
Chloride: 96 meq/L (ref 96–112)
Creatinine, Ser: 5.46 mg/dL (ref 0.40–1.20)
GFR: 7.91 mL/min — CL (ref >60.00)
Glucose, Bld: 234 mg/dL — ABNORMAL HIGH (ref 70–99)
Potassium: 6 meq/L — ABNORMAL HIGH (ref 3.5–5.1)
Sodium: 136 meq/L (ref 135–145)
Total Bilirubin: 0.5 mg/dL (ref 0.2–1.2)
Total Protein: 7.5 g/dL (ref 6.0–8.3)

## 2023-10-19 MED ORDER — PANTOPRAZOLE SODIUM 40 MG PO TBEC: 40.0000 mg | DELAYED_RELEASE_TABLET | Freq: Every day | ORAL | 3 refills | Status: AC

## 2023-10-19 NOTE — Telephone Encounter (Signed)
 Lab calling stating patient's Creatinine is 5.46 and GFR 7.91. Please advise

## 2023-10-19 NOTE — Progress Notes (Signed)
 Chief Complaint: Abdo pain, chronic nausea.  Referring Provider:  Jordan Hawks, PA-C      ASSESSMENT AND PLAN;   #1. Abdo pain with N/V  #2. ESRD on HD (MWF)  #3. CRC screening  #4. Chronic calcific pancreatitis-incidental finding on CT several years ago. No ETOH   Plan: -CT AP with contrast -Protonix 40mg  po every day #90 -EGD/colon at [facility] (on HD) -CBC, CMP, lipase -If still with problems, solid phase GES to rule out diabetic gastroparesis.   I discussed EGD/Colonoscopy- the indications, risks, alternatives and potential complications including, but not limited to bleeding, infection, reaction to meds, damage to internal organs, cardiac and/or pulmonary problems, and perforation requiring surgery. The possibility that significant findings could be missed was explained. All ? were answered. Pt consents to proceed. HPI:    Suzanne Allen is a 63 y.o. female  With multiple medical problems as listed below including HTN/DM with ESRD on HD (MWF)  Discussed the use of AI scribe software for clinical note transcription with the patient, who gave verbal consent to proceed.  History of Present Illness Suzanne Allen is a 63 year old female with chronic kidney disease on dialysis who presents with persistent nausea and vomiting. She was referred for evaluation of her persistent nausea and abdominal pain.  She has been experiencing persistent nausea for almost a year, which has remained steady without worsening. Vomiting frequently occurs after eating, often shortly after food reaches the stomach. Her weight has fluctuated, sometimes increasing despite vomiting. The nausea and vomiting do not seem to be related to specific foods. She experiences intermittent diarrhea and constipation, and has recently developed heartburn, which is a new symptom.  Has been having epigastric and lower abdominal pain.  She has a history of chronic kidney disease secondary to hypertension and is  currently on dialysis. She has not undergone an endoscopy but had a colonoscopy at age 58, during which a polyp was removed. She has not had a recent CT scan of her abdomen, with the last one being four years ago for her foot. Her kidney issues are primarily due to hypertension.  She has a history of chronic pancreatitis with past pancreatic attacks, but she does not consume alcohol. This is atypical for pancreatitis etiology.  Her medication regimen includes vitamin C, vitamin D, clonazepam, Cosopt eye drops, Cymbalta, ferric citrate, insulin for type 1 diabetes, thyroid medication following thyroidectomy, Zestril, Zofran (which helps occasionally with nausea), Crestor, trazodone, and nifedipine. No use of ibuprofen, Advil, or alcohol.     Colon at age 49- polyp   Results LABS Hb: 12.9 (10/09/2023) Glucose: 115 (10/09/2023) Hematocrit: 38 (10/09/2023) Bilirubin: 0.2 (2024) Alkaline Phosphatase: 119 (2024) AST: 17 (2024) ALT: 12 (2024)    Past Medical History:  Diagnosis Date   Adenopathy, right inguinal and external iliac 04/04/2014   Anemia    Anxiety    CAP (community acquired pneumonia) 03/13/2015   Carpal tunnel syndrome 11/28/2014   Bilateral   Chronic pancreatitis (HCC)    Depression    Diabetes mellitus type I (HCC) dx'd 1977   Diabetic osteomyelitis (HCC)    right great toe/progress notes 09/23/2012   Diabetic retinopathy (HCC)    ESRD (end stage renal disease) (HCC)    ESRD- Hemo MWF- [facility]   Fatty liver 04/04/2014   Glaucoma    HTN (hypertension)    Hypercholesteremia    Hypothyroidism    Macular degeneration, bilateral    Migraines 1980's   "  when I was in my 20's" (03/14/2015)   Peripheral neuropathy    Pneumonia 1993   PTSD (post-traumatic stress disorder)    Stroke [facility]) ? date & 09/22/2012   TIA's - no resuidual   TIA (transient ischemic attack) 09/22/2012   Hattie Perch 10/25/2012    Past Surgical History:  Procedure Laterality Date   A/V FISTULAGRAM  N/A 08/31/2018   Procedure: A/V FISTULAGRAM - Left arm;  Surgeon: Cephus Shelling, MD;  Location: [facility] INVASIVE CV LAB;  Service: Cardiovascular;  Laterality: N/A;   A/V FISTULAGRAM N/A 11/04/2018   Procedure: A/V FISTULAGRAM - Left Arm;  Surgeon: Cephus Shelling, MD;  Location: [facility] INVASIVE CV LAB;  Service: Cardiovascular;  Laterality: N/A;   AMPUTATION Right 07/12/2015   Procedure: Right Transmetatarsal amputation;  Surgeon: Nadara Mustard, MD;  Location: [facility] OR;  Service: Orthopedics;  Laterality: Right;   BASCILIC VEIN TRANSPOSITION Left 12/31/2017   Procedure: FIRST STAGE BASILIC VEIN TRANSPOSITION LEFT ARM;  Surgeon: Fransisco Hertz, MD;  Location: [facility] OR;  Service: Vascular;  Laterality: Left;   BASCILIC VEIN TRANSPOSITION Left 02/21/2018   Procedure: SECOND STAGE BASILIC VEIN TRANSPOSITION, LEFT UPPER ARM;  Surgeon: Cephus Shelling, MD;  Location: [facility] OR;  Service: Vascular;  Laterality: Left;   CATARACT EXTRACTION, BILATERAL     CESAREAN SECTION  1989; 1995   EYE SURGERY Bilateral    "hemorrhaged behind my eye"   I & D EXTREMITY Right multiple   "big toe"   INCISION AND DRAINAGE ABSCESS Left 12/08/2018   Procedure: Incision And Drainage of Left Axillary Abscess;  Surgeon: Cephus Shelling, MD;  Location: [facility] OR;  Service: Vascular;  Laterality: Left;   PERIPHERAL VASCULAR INTERVENTION Left 08/31/2018   Procedure: PERIPHERAL VASCULAR INTERVENTION;  Surgeon: Cephus Shelling, MD;  Location: [facility] INVASIVE CV LAB;  Service: Cardiovascular;  Laterality: Left;   PERIPHERAL VASCULAR INTERVENTION Left 11/04/2018   Procedure: PERIPHERAL VASCULAR INTERVENTION;  Surgeon: Cephus Shelling, MD;  Location: [facility] INVASIVE CV LAB;  Service: Cardiovascular;  Laterality: Left;  arm fistula   PERIPHERALLY INSERTED CENTRAL CATHETER INSERTION Right 08/31/2012   PICC LINE REMOVAL ([facility] HX) Right 10/12/2012   Hattie Perch 10/12/2012   REFRACTIVE SURGERY Bilateral 2013   TOE AMPUTATION Right 11/2012   "big  toe"   TONSILLECTOMY  1965   TOTAL THYROIDECTOMY  2012   TUBAL LIGATION  1995    Family History  Problem Relation Age of Onset   Depression Mother    Diabetes Mother    Alcohol abuse Father    Alcohol abuse Sister    Diabetes Maternal Grandmother    Colon cancer Neg Hx    Esophageal cancer Neg Hx    Stomach cancer Neg Hx     Social History   Tobacco Use   Smoking status: Never   Smokeless tobacco: Never  Vaping Use   Vaping status: Never Used  Substance Use Topics   Alcohol use: No    Alcohol/week: 0.0 standard drinks of alcohol   Drug use: No    Current Outpatient Medications  Medication Sig Dispense Refill   Ascorbic Acid (VITAMIN C GUMMIE PO) Take 2 tablets by mouth daily.     Cholecalciferol (VITAMIN D-3) 125 MCG (5000 UT) TABS Take 5,000 Units by mouth daily.     clonazePAM (KLONOPIN) 1 MG tablet Take one tab bed time as needed for insomnia one before dialysis for severe anxiety. 50 tablet 1   dorzolamide-timolol (COSOPT) 22.3-6.8 MG/ML ophthalmic solution Place  1 drop into both eyes 2 (two) times daily.      DULoxetine (CYMBALTA) 30 MG capsule Take 1 capsule (30 mg total) by mouth daily. 90 capsule 0   DULoxetine (CYMBALTA) 60 MG capsule Take 1 capsule (60 mg total) by mouth daily. 90 capsule 2   ferric citrate (AURYXIA) 1 GM 210 MG(Fe) tablet Take 420 mg by mouth See admin instructions. 2-3  tabs with meals, 1 tab with snacks     glucose blood test strip one strip (1 each total) by Other route 4 (four) times daily. Use as directed. Pharmacy, please dispense this brand of blood glucose test strips: Bayer Contour Next     insulin glargine (LANTUS) 100 UNIT/ML injection Inject 40 Units into the skin 2 (two) times daily.     insulin lispro (HUMALOG) 100 UNIT/ML injection Inject 20 Units into the skin See admin instructions. Inject up to 20 units subcutaneously three times daily before meals if needed Per sliding scale as directed     levothyroxine (SYNTHROID, LEVOTHROID)  150 MCG tablet Take 150 mcg by mouth daily before breakfast.     lisinopril (PRINIVIL,ZESTRIL) 20 MG tablet Take 20 mg by mouth at bedtime.   5   multivitamin (RENA-VIT) TABS tablet Take 1 tablet by mouth at bedtime.      ondansetron (ZOFRAN ODT) 4 MG disintegrating tablet Take 1 tablet (4 mg total) by mouth every 8 (eight) hours as needed for nausea or vomiting. 10 tablet 0   RESTASIS 0.05 % ophthalmic emulsion Place 1 drop into both eyes 2 (two) times daily as needed (for dry/irritated eyes).   11   rosuvastatin (CRESTOR) 40 MG tablet Take 40 mg by mouth daily.     traZODone (DESYREL) 100 MG tablet Take 1 tablet (100 mg total) by mouth at bedtime as needed for sleep. 90 tablet 0   NIFEdipine (ADALAT CC) 30 MG 24 hr tablet Take by mouth. (Patient not taking: Reported on 10/19/2023)     No current facility-administered medications for this visit.    Allergies  Allergen Reactions   Erythromycin Anaphylaxis   Isosorbide    Norvasc [Amlodipine] Shortness Of Breath and Swelling   Vancomycin Shortness Of Breath and Other (See Comments)    Red Man Syndrome   Sulfa Antibiotics Other (See Comments)   Nsaids Other (See Comments)    Avoid due to CKD   Wellbutrin [Bupropion] Other (See Comments)    Extreme neurological effects     Review of Systems:  Constitutional: Denies fever, chills, diaphoresis, appetite change and has fatigue.  HEENT: neg   Respiratory: Denies SOB, DOE, cough, chest tightness,  and wheezing.   Cardiovascular: Denies chest pain, palpitations and has leg swelling.  Genitourinary: Denies dysuria, urgency, frequency, hematuria, flank pain and difficulty urinating.  Musculoskeletal: has myalgias, back pain, joint swelling, arthralgias and gait problem.  Skin: No rash.  Neurological: Denies dizziness, seizures, syncope, weakness, light-headedness, numbness and headaches.  Hematological: Denies adenopathy. Easy bruising, personal or family bleeding history   Psychiatric/Behavioral: has anxiety or depression     Physical Exam:    BP 134/72   Pulse 72   Ht 5\' 3"  (1.6 m)   Wt 152 lb 3.2 oz (69 kg)   SpO2 95%   BMI 26.96 kg/m  Wt Readings from Last 3 Encounters:  10/19/23 152 lb 3.2 oz (69 kg)  08/17/20 143 lb 14.4 oz (65.3 kg)  12/02/19 148 lb (67.1 kg)   Constitutional:  Well-developed, in no acute distress.  Psychiatric: Normal mood and affect. Behavior is normal. HEENT: Pupils normal.  Conjunctivae are normal. No scleral icterus. Cardiovascular: Normal rate, regular rhythm. No edema Pulmonary/chest: Effort normal and breath sounds normal. No wheezing, rales or rhonchi. Abdominal: Soft, nondistended. Nontender. Bowel sounds active throughout. There are no masses palpable. No hepatomegaly. Rectal: Deferred Neurological: Alert and oriented to person place and time. Skin: Skin is warm and dry. No rashes noted.  Data Reviewed: I have personally reviewed following labs and imaging studies  CBC:    Latest Ref Rng & Units 08/17/2020    9:13 AM 08/16/2020   12:04 PM 08/15/2020    3:28 AM  CBC  WBC 4.0 - 10.5 K/uL 8.2  10.4  10.3   Hemoglobin 12.0 - 15.0 g/dL 8.7  8.7  9.3   Hematocrit 36.0 - 46.0 % 27.9  26.9  29.2   Platelets 150 - 400 K/uL 277  263  283     CMP:    Latest Ref Rng & Units 08/17/2020    9:14 AM 08/15/2020    3:28 AM 08/14/2020    4:48 PM  CMP  Glucose 70 - 99 mg/dL 308  67  657   BUN 6 - 20 mg/dL 64  25  15   Creatinine 0.44 - 1.00 mg/dL 8.46  9.62  9.52   Sodium 135 - 145 mmol/L 135  136  136   Potassium 3.5 - 5.1 mmol/L 3.8  3.7  3.5   Chloride 98 - 111 mmol/L 93  92  91   CO2 22 - 32 mmol/L 25  31  31    Calcium 8.9 - 10.3 mg/dL 7.0  7.4  7.8   Total Protein 6.5 - 8.1 g/dL  6.4  8.0   Total Bilirubin 0.3 - 1.2 mg/dL  1.1  0.7   Alkaline Phos 38 - 126 U/L  54  69   AST 15 - 41 U/L  19  19   ALT 0 - 44 U/L  17  19         Edman Circle, MD 10/19/2023, 10:42 AM  Cc: Jordan Hawks, PA-C

## 2023-10-19 NOTE — Patient Instructions (Addendum)
 _______________________________________________________  If your blood pressure at your visit was 140/90 or greater, please contact your primary care physician to follow up on this.  _______________________________________________________  If you are age 62 or older, your body mass index should be between 23-30. Your Body mass index is 26.96 kg/m. If this is out of the aforementioned range listed, please consider follow up with your Primary Care Provider.  If you are age 61 or younger, your body mass index should be between 19-25. Your Body mass index is 26.96 kg/m. If this is out of the aformentioned range listed, please consider follow up with your Primary Care Provider.   ________________________________________________________  The [facility] providers would like to encourage you to use [facility] to communicate with providers for non-urgent requests or questions.  Due to long hold times on the telephone, sending your provider a message by [facility] may be a faster and more efficient way to get a response.  Please allow 48 business hours for a response.  Please remember that this is for non-urgent requests.  _______________________________________________________  Your provider has requested that you go to the basement level for lab work before leaving today. Press "B" on the elevator. The lab is located at the first door on the left as you exit the elevator.  You have been scheduled for a CT scan of the abdomen and pelvis at [facility] ([address] [place], [place], [place] [address]).   You are scheduled on 10-28-23 at 1pm. You should arrive by 11am your appointment time for registration. Please follow the written instructions below on the day of your exam:  WARNING: IF YOU ARE ALLERGIC TO IODINE/X-RAY DYE, PLEASE NOTIFY RADIOLOGY IMMEDIATELY AT [phone]! YOU WILL BE GIVEN A 13 HOUR PREMEDICATION PREP.  1) Do not eat or drink anything after  9am (4 hours prior to your test) 2) You  will be given 2 bottles of oral contrast to drink on site.    Drink 1 bottle of contrast @ 11am (2 hours prior to your exam)  Drink 1 bottle of contrast @ 12pm (1 hour prior to your exam)  You may take any medications as prescribed with a small amount of water, if necessary. If you take any of the following medications: METFORMIN, GLUCOPHAGE, GLUCOVANCE, AVANDAMET, RIOMET, FORTAMET, ACTOPLUS MET, JANUMET, GLUMETZA or METAGLIP, you MAY be asked to HOLD this medication 48 hours AFTER the exam.  The purpose of you drinking the oral contrast is to aid in the visualization of your intestinal tract. The contrast solution may cause some diarrhea. Depending on your individual set of symptoms, you may also receive an intravenous injection of x-ray contrast/dye. Plan on being at [facility] for 30 minutes or longer, depending on the type of exam you are having performed.  This test typically takes 30-45 minutes to complete.  If you have any questions regarding your exam or if you need to reschedule, you may call the CT department at [phone] between the hours of 8:00 am and 5:00 pm, Monday-Friday.   No iron supplements 5 days prior to your procedure  You have been scheduled for an endoscopy and colonoscopy. Case [id number]. Please follow the written instructions given to you at your visit today.  If you use inhalers (even only as needed), please bring them with you on the day of your procedure.  DO NOT TAKE 7 DAYS PRIOR TO TEST- Trulicity (dulaglutide) Ozempic, Wegovy (semaglutide) Mounjaro (tirzepatide) Bydureon Bcise (exanatide extended release)  DO NOT TAKE 1 DAY  PRIOR TO YOUR TEST Rybelsus (semaglutide) Adlyxin (lixisenatide) Victoza (liraglutide) Byetta (exanatide) ___________________________________________________________________________

## 2023-10-20 NOTE — Telephone Encounter (Signed)
 Thank you that was it!

## 2023-10-20 NOTE — Telephone Encounter (Signed)
 Patient stated she would have dialysis clinic look at labs, read my chart message and confirmed.

## 2023-10-20 NOTE — Telephone Encounter (Signed)
 Patient on hemodialysis That is her baseline.RG

## 2023-10-21 ENCOUNTER — Encounter (HOSPITAL_COMMUNITY): Payer: Self-pay | Admitting: Psychiatry

## 2023-10-21 ENCOUNTER — Telehealth (HOSPITAL_BASED_OUTPATIENT_CLINIC_OR_DEPARTMENT_OTHER): Payer: 59 | Admitting: Psychiatry

## 2023-10-21 DIAGNOSIS — F4310 Post-traumatic stress disorder, unspecified: Secondary | ICD-10-CM | POA: Diagnosis not present

## 2023-10-21 DIAGNOSIS — F410 Panic disorder [episodic paroxysmal anxiety] without agoraphobia: Secondary | ICD-10-CM | POA: Diagnosis not present

## 2023-10-21 DIAGNOSIS — F330 Major depressive disorder, recurrent, mild: Secondary | ICD-10-CM

## 2023-10-21 MED ORDER — DULOXETINE HCL 30 MG PO CPEP: 30.0000 mg | ORAL_CAPSULE | Freq: Every day | ORAL | 0 refills | Status: DC

## 2023-10-21 MED ORDER — DULOXETINE HCL 60 MG PO CPEP: 60.0000 mg | ORAL_CAPSULE | Freq: Every day | ORAL | 2 refills | Status: DC

## 2023-10-21 MED ORDER — CLONAZEPAM 1 MG PO TABS
ORAL_TABLET | ORAL | 1 refills | Status: DC
Start: 2023-10-21 — End: 2024-01-18

## 2023-10-21 MED ORDER — TRAZODONE HCL 100 MG PO TABS: 100.0000 mg | ORAL_TABLET | Freq: Every evening | ORAL | 0 refills | Status: DC | PRN
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
## 2023-10-28 ENCOUNTER — Ambulatory Visit (HOSPITAL_COMMUNITY)

## 2023-12-10 ENCOUNTER — Ambulatory Visit (HOSPITAL_COMMUNITY)
Admission: EM | Admit: 2023-12-10 | Discharge: 2023-12-10 | Disposition: A | Discharge status: Home / Self Care | Attending: Emergency Medicine | Admitting: Emergency Medicine

## 2023-12-10 ENCOUNTER — Encounter (HOSPITAL_COMMUNITY)
Admission: EM | Disposition: A | Discharge status: Home / Self Care | Payer: Self-pay | Source: Home / Self Care | Attending: Emergency Medicine

## 2023-12-10 ENCOUNTER — Encounter (HOSPITAL_COMMUNITY): Payer: Self-pay | Admitting: Emergency Medicine

## 2023-12-10 ENCOUNTER — Other Ambulatory Visit: Payer: Self-pay

## 2023-12-10 DIAGNOSIS — E1022 Type 1 diabetes mellitus with diabetic chronic kidney disease: Secondary | ICD-10-CM | POA: Diagnosis not present

## 2023-12-10 DIAGNOSIS — Z5986 Financial insecurity: Secondary | ICD-10-CM | POA: Diagnosis not present

## 2023-12-10 DIAGNOSIS — Z794 Long term (current) use of insulin: Secondary | ICD-10-CM | POA: Diagnosis not present

## 2023-12-10 DIAGNOSIS — Z5941 Food insecurity: Secondary | ICD-10-CM | POA: Diagnosis not present

## 2023-12-10 DIAGNOSIS — Z992 Dependence on renal dialysis: Secondary | ICD-10-CM | POA: Diagnosis not present

## 2023-12-10 DIAGNOSIS — Y841 Kidney dialysis as the cause of abnormal reaction of the patient, or of later complication, without mention of misadventure at the time of the procedure: Secondary | ICD-10-CM | POA: Insufficient documentation

## 2023-12-10 DIAGNOSIS — N186 End stage renal disease: Secondary | ICD-10-CM | POA: Diagnosis not present

## 2023-12-10 DIAGNOSIS — T82838A Hemorrhage of vascular prosthetic devices, implants and grafts, initial encounter: Secondary | ICD-10-CM | POA: Diagnosis present

## 2023-12-10 DIAGNOSIS — I120 Hypertensive chronic kidney disease with stage 5 chronic kidney disease or end stage renal disease: Secondary | ICD-10-CM | POA: Diagnosis not present

## 2023-12-10 DIAGNOSIS — T82858A Stenosis of vascular prosthetic devices, implants and grafts, initial encounter: Secondary | ICD-10-CM | POA: Diagnosis not present

## 2023-12-10 HISTORY — PX: A/V FISTULAGRAM: CATH118298

## 2023-12-10 HISTORY — PX: A/V SHUNT INTERVENTION: CATH118220

## 2023-12-10 LAB — CBC WITH DIFFERENTIAL/PLATELET
Abs Immature Granulocytes: 0.04 10*3/uL (ref 0.00–0.07)
Basophils Absolute: 0.1 10*3/uL (ref 0.0–0.1)
Basophils Relative: 1 %
Eosinophils Absolute: 0.8 10*3/uL — ABNORMAL HIGH (ref 0.0–0.5)
Eosinophils Relative: 9 %
HCT: 31.2 % — ABNORMAL LOW (ref 36.0–46.0)
Hemoglobin: 9.8 g/dL — ABNORMAL LOW (ref 12.0–15.0)
Immature Granulocytes: 0 %
Lymphocytes Relative: 19 %
Lymphs Abs: 1.7 10*3/uL (ref 0.7–4.0)
MCH: 30.8 pg (ref 26.0–34.0)
MCHC: 31.4 g/dL (ref 30.0–36.0)
MCV: 98.1 fL (ref 80.0–100.0)
Monocytes Absolute: 0.8 10*3/uL (ref 0.1–1.0)
Monocytes Relative: 9 %
Neutro Abs: 5.8 10*3/uL (ref 1.7–7.7)
Neutrophils Relative %: 62 %
Platelets: 267 10*3/uL (ref 150–400)
RBC: 3.18 MIL/uL — ABNORMAL LOW (ref 3.87–5.11)
RDW: 14 % (ref 11.5–15.5)
WBC: 9.2 10*3/uL (ref 4.0–10.5)
nRBC: 0 % (ref 0.0–0.2)

## 2023-12-10 LAB — BASIC METABOLIC PANEL WITH GFR
Anion gap: 8 (ref 5–15)
BUN: 44 mg/dL — ABNORMAL HIGH (ref 8–23)
CO2: 25 mmol/L (ref 22–32)
Calcium: 8.4 mg/dL — ABNORMAL LOW (ref 8.9–10.3)
Chloride: 101 mmol/L (ref 98–111)
Creatinine, Ser: 6.59 mg/dL — ABNORMAL HIGH (ref 0.44–1.00)
GFR, Estimated: 7 mL/min — ABNORMAL LOW (ref >60)
Glucose, Bld: 151 mg/dL — ABNORMAL HIGH (ref 70–99)
Potassium: 5.9 mmol/L — ABNORMAL HIGH (ref 3.5–5.1)
Sodium: 134 mmol/L — ABNORMAL LOW (ref 135–145)

## 2023-12-10 SURGERY — A/V FISTULAGRAM
Anesthesia: LOCAL | Laterality: Left

## 2023-12-10 MED ORDER — LIDOCAINE-EPINEPHRINE (PF) 2 %-1:200000 IJ SOLN
10.0000 mL | Freq: Once | INTRAMUSCULAR | Status: AC
  Administered 2023-12-10: 10 mL via INTRADERMAL
  Filled 2023-12-10: qty 20

## 2023-12-10 MED ORDER — CHLORHEXIDINE GLUCONATE CLOTH 2 % EX PADS: 6.0000 | MEDICATED_PAD | Freq: Every day | CUTANEOUS | Status: DC

## 2023-12-10 MED ORDER — LIDOCAINE HCL (PF) 1 % IJ SOLN
INTRAMUSCULAR | Status: DC | PRN
Start: 2023-12-10 — End: 2023-12-13
  Administered 2023-12-10: 10 mL

## 2023-12-10 MED ORDER — IODIXANOL 320 MG/ML IV SOLN
INTRAVENOUS | Status: DC | PRN
  Administered 2023-12-10: 20 mL

## 2023-12-10 MED ORDER — SODIUM ZIRCONIUM CYCLOSILICATE 10 G PO PACK
10.0000 g | PACK | Freq: Once | ORAL | Status: AC
  Administered 2023-12-10: 10 g via ORAL
  Filled 2023-12-10: qty 1

## 2023-12-10 MED ORDER — HEPARIN (PORCINE) IN NACL 1000-0.9 UT/500ML-% IV SOLN
INTRAVENOUS | Status: DC | PRN
Start: 2023-12-10 — End: 2023-12-13
  Administered 2023-12-10: 500 mL

## 2023-12-10 MED ORDER — LIDOCAINE HCL (PF) 1 % IJ SOLN
INTRAMUSCULAR | Status: AC
  Filled 2023-12-10: qty 30

## 2023-12-10 SURGICAL SUPPLY — 8 items
BALLOON MUSTANG 7.0X20 75 (BALLOONS) IMPLANT
COVER DOME SNAP 22 D (MISCELLANEOUS) ×1 IMPLANT
KIT ENCORE 26 ADVANTAGE (KITS) IMPLANT
KIT MICROPUNCTURE NIT STIFF (SHEATH) IMPLANT
SHEATH PINNACLE R/O II 6F 4CM (SHEATH) IMPLANT
TRAY PV CATH (CUSTOM PROCEDURE TRAY) ×1 IMPLANT
TUBING CIL FLEX 10 FLL-RA (TUBING) ×1 IMPLANT
WIRE BENTSON .035X145CM (WIRE) IMPLANT

## 2023-12-10 NOTE — ED Triage Notes (Signed)
 Patient arrives via [facility] from dialysis center for bleeding from dialysis fistula of left side. EMS states dialysis center reports that patient "filled up two trash cans of blood." Endorses dizziness. Unable to complete dialysis session. 18G RFA.   BP 140/80, HR 85 SPO2 98

## 2023-12-10 NOTE — ED Notes (Signed)
 Pt in bed, no bleeding noted at this time, pt denies pain

## 2023-12-10 NOTE — ED Notes (Signed)
 Vascular surgery at bedside.

## 2023-12-10 NOTE — ED Provider Notes (Signed)
 [place] EMERGENCY DEPARTMENT AT [facility] Provider Note   CSN: [id number] Arrival date & time: 12/10/23  1696     History  Chief Complaint  Patient presents with   Vascular Access Problem    Suzanne Allen is a 63 y.o. female.  63 yo F with a chief complaints of bleeding from her fistula.  She apparently had dialysis access today and had bleeding from one of the access sites.  This was not noticed for some time and when someone checked on her they realized that she had been bleeding.  They deaccessed her and then try to hold pressure for about 45 minutes but had continued bleeding and so canceled dialysis and sent her here for evaluation.        Home Medications Prior to Admission medications   Medication Sig Start Date End Date Taking? Authorizing Provider  Ascorbic Acid (VITAMIN C GUMMIE PO) Take 2 tablets by mouth daily.    [provider]  Cholecalciferol  (VITAMIN D -3) 125 MCG (5000 UT) TABS Take 5,000 Units by mouth daily.    [provider]  clonazePAM  (KLONOPIN ) 1 MG tablet Take one tab bed time as needed for insomnia one before dialysis for severe anxiety. 10/21/23   Arfeen, Bronson Canny, MD  dorzolamide -timolol  (COSOPT ) 22.3-6.8 MG/ML ophthalmic solution Place 1 drop into both eyes 2 (two) times daily.     [provider]  DULoxetine  (CYMBALTA ) 30 MG capsule Take 1 capsule (30 mg total) by mouth daily. 10/21/23 01/19/24  Arfeen, Bronson Canny, MD  DULoxetine  (CYMBALTA ) 60 MG capsule Take 1 capsule (60 mg total) by mouth daily. 10/21/23   Arfeen, Bronson Canny, MD  ferric citrate  (AURYXIA ) 1 GM 210 MG(Fe) tablet Take 420 mg by mouth See admin instructions. 2-3  tabs with meals, 1 tab with snacks    [provider]  glucose blood test strip one strip (1 each total) by Other route 4 (four) times daily. Use as directed. Pharmacy, please dispense this brand of blood glucose test strips: Bayer Contour Next 03/09/17   [provider]  insulin   glargine (LANTUS ) 100 UNIT/ML injection Inject 40 Units into the skin 2 (two) times daily.    [provider]  insulin  lispro (HUMALOG ) 100 UNIT/ML injection Inject 20 Units into the skin See admin instructions. Inject up to 20 units subcutaneously three times daily before meals if needed Per sliding scale as directed    [provider]  levothyroxine  (SYNTHROID , LEVOTHROID) 150 MCG tablet Take 150 mcg by mouth daily before breakfast.    [provider]  lisinopril  (PRINIVIL ,ZESTRIL ) 20 MG tablet Take 20 mg by mouth at bedtime.  10/24/17   [provider]  multivitamin (RENA-VIT) TABS tablet Take 1 tablet by mouth at bedtime.     [provider]  NIFEdipine (ADALAT CC) 30 MG 24 hr tablet Take by mouth. Patient not taking: Reported on 10/19/2023 05/21/21   [provider]  ondansetron  (ZOFRAN  ODT) 4 MG disintegrating tablet Take 1 tablet (4 mg total) by mouth every 8 (eight) hours as needed for nausea or vomiting. 12/02/19   Jerilynn Montenegro, MD  pantoprazole  (PROTONIX ) 40 MG tablet Take 1 tablet (40 mg total) by mouth daily. 10/19/23   Lajuan Pila, MD  RESTASIS  0.05 % ophthalmic emulsion Place 1 drop into both eyes 2 (two) times daily as needed (for dry/irritated eyes).  12/29/17   [provider]  rosuvastatin  (CRESTOR ) 40 MG tablet Take 40 mg by mouth daily.  [provider]  traZODone  (DESYREL ) 100 MG tablet Take 1 tablet (100 mg total) by mouth at bedtime as needed for sleep. 10/21/23   Arfeen, Bronson Canny, MD      Allergies    Erythromycin, Isosorbide, Norvasc  [amlodipine ], Vancomycin , Sulfa antibiotics, Nsaids, and Wellbutrin [bupropion]    Review of Systems   Review of Systems  Physical Exam Updated Vital Signs BP (!) 165/72   Pulse 69   Temp 98.6 F (37 C) (Oral)   Resp (!) 24   Ht 5\' 3"  (1.6 m)   Wt 67.1 kg   SpO2 97%   BMI 26.22 kg/m  Physical Exam Vitals and nursing note reviewed.  Constitutional:      General:  She is not in acute distress.    Appearance: She is well-developed. She is not diaphoretic.  HENT:     Head: Normocephalic and atraumatic.  Eyes:     Pupils: Pupils are equal, round, and reactive to light.  Cardiovascular:     Rate and Rhythm: Normal rate and regular rhythm.     Heart sounds: No murmur heard.    No friction rub. No gallop.  Pulmonary:     Effort: Pulmonary effort is normal.     Breath sounds: No wheezing or rales.  Abdominal:     General: There is no distension.     Palpations: Abdomen is soft.     Tenderness: There is no abdominal tenderness.  Musculoskeletal:        General: No tenderness.     Cervical back: Normal range of motion and neck supple.     Comments: Pressure dressing noted to the left AC.  Intact radial pulse.  Skin:    General: Skin is warm and dry.  Neurological:     Mental Status: She is alert and oriented to person, place, and time.  Psychiatric:        Behavior: Behavior normal.     ED Results / Procedures / Treatments   Labs (all labs ordered are listed, but only abnormal results are displayed) Labs Reviewed  CBC WITH DIFFERENTIAL/PLATELET - Abnormal; Notable for the following components:      Result Value   RBC 3.18 (*)    Hemoglobin 9.8 (*)    HCT 31.2 (*)    Eosinophils Absolute 0.8 (*)    All other components within normal limits  BASIC METABOLIC PANEL WITH GFR - Abnormal; Notable for the following components:   Sodium 134 (*)    Potassium 5.9 (*)    Glucose, Bld 151 (*)    BUN 44 (*)    Creatinine, Ser 6.59 (*)    Calcium  8.4 (*)    GFR, Estimated 7 (*)    All other components within normal limits  HEPATITIS B SURFACE ANTIGEN  HEPATITIS B SURFACE ANTIBODY, QUANTITATIVE    EKG EKG Interpretation Date/Time:  Friday Dec 10 2023 06:54:26 EDT Ventricular Rate:  63 PR Interval:  200 QRS Duration:  130 QT Interval:  469 QTC Calculation: 481 R Axis:   -21  Text Interpretation: Sinus rhythm Right bundle branch block  Minimal ST elevation, lateral leads No significant change since last tracing Confirmed by Albertus Hughs [phone]) on 12/10/2023 7:06:12 AM  Radiology PERIPHERAL VASCULAR CATHETERIZATION Result Date: 12/10/2023 Table formatting from the original result was not included. Images from the original result were not included. DATE OF SERVICE: 12/10/2023  PATIENT:  Suzanne Allen  63 y.o. female  PRE-OPERATIVE DIAGNOSIS:  prolonged bleeding from left arm  fistula  POST-OPERATIVE DIAGNOSIS:  Same  PROCEDURE:  1) left arm fistulagram 2) left arm AVF angioplasty (7x31mm Mustang)  SURGEON:  Surgeons and Role:    * Carlene Che, MD - Primary  ASSISTANT: none  ANESTHESIA:   local  EBL: minimal  BLOOD ADMINISTERED:none  DRAINS: none  LOCAL MEDICATIONS USED:  LIDOCAINE   SPECIMEN:  none  COUNTS: confirmed correct.  TOURNIQUET:  none  PATIENT DISPOSITION:  PACU - hemodynamically stable.  Delay start of Pharmacological VTE agent (>24hrs) due to surgical blood loss or risk of bleeding: no  INDICATION FOR PROCEDURE: LE FERRAZ is a 63 y.o. female with ESRD.  She dialyzes through a left arm brachiobasilic arteriovenous fistula.  She had prolonged bleeding from access today, prompting emergency department evaluation.  After careful discussion of risks, benefits, and alternatives the patient was offered fistulogram. The patient understood and wished to proceed.  OPERATIVE FINDINGS: No central venous stenosis.  No subclavian or axillary vein stenosis.  Previously placed Viabahn stent had edge stenosis at the central margin.  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  The anastomosis appeared patent without significant stenosis.  Will result from angioplasty  DESCRIPTION OF PROCEDURE: After identification of the patient in the pre-operative holding area, the patient was transferred to the operating room. The patient was positioned supine on the operating room table. Anesthesia was induced. The left arm was prepped and  draped in standard fashion. A surgical pause was performed confirming correct patient, procedure, and operative location.  Micropuncture technique was used to access the left arm fistula.  Seldinger technique was used to introduce a micropuncture sheath into the fistula.  Fistulogram was performed in stations.  Prior stenting was noted, edge stent stenosis was noted.  Lesion was crossed with a Bentson wire.  Access was upsized to 6 [place].  7 x 20 mm Mustang balloon was used to angioplasty the in-stent restenosis.  Good technical result was achieved on follow-up angiogram.  The thrill was improved after intervention.  A pursestring suture was placed around the access site.  All endovascular equipment was removed.  The pursestring suture was secured.  Hemostasis was achieved.  A bandage was applied.  Upon completion of the case instrument and sharps counts were confirmed correct. The patient was transferred to the PACU in good condition. I was present for all portions of the procedure.  FOLLOW UP PLAN: Follow-up with [facility] in 4 weeks with AV fistula duplex  [name]. Edgardo Goodwill, MD [facility] Phone Number: [phone] 12/10/2023 2:41 PM        Procedures .Laceration Repair  Date/Time: 12/10/2023 12:27 PM  Performed by: Albertus Hughs, DO Authorized by: Albertus Hughs, DO   Consent:    Consent obtained:  Verbal   Consent given by:  Patient   Risks, benefits, and alternatives were discussed: yes     Risks discussed:  Infection and pain (fistula injury)   Alternatives discussed:  Referral and observation Anesthesia:    Anesthesia method:  Local infiltration   Local anesthetic:  Lidocaine  2% WITH epi Laceration details:    Location:  Shoulder/arm   Shoulder/arm location:  L upper arm   Length (cm):  0.5 Exploration:    Limited defect created (wound extended): no     Hemostasis obtained with: figure of 8.   Contaminated: no   Skin repair:    Repair method:   Sutures   Suture size:  4-0   Wound  skin closure material used: monocryl.   Suture technique:  Figure eight   Number of sutures:  1 Approximation:    Approximation:  Close Repair type:    Repair type:  Simple Post-procedure details:    Dressing:  Open (no dressing)   Procedure completion:  Tolerated well, no immediate complications     Medications Ordered in ED Medications  Chlorhexidine  Gluconate Cloth 2 % PADS 6 each ( Topical Automatically Held 12/18/23 0600)  Heparin  (Porcine) in NaCl 1000-0.9 UT/500ML-% SOLN (500 mLs  Given 12/10/23 1404)  lidocaine  (PF) (XYLOCAINE ) 1 % injection (10 mLs Other Given 12/10/23 1407)  iodixanol  (VISIPAQUE ) 320 MG/ML injection (20 mLs  Given 12/10/23 1424)  sodium zirconium cyclosilicate (LOKELMA) packet 10 g (has no administration in time range)  lidocaine -EPINEPHrine  (XYLOCAINE  W/EPI) 2 %-1:200000 (PF) injection 10 mL (10 mLs Intradermal Given by Other 12/10/23 0981)    ED Course/ Medical Decision Making/ A&P                                 Medical Decision Making Amount and/or Complexity of Data Reviewed Labs: ordered.  Risk Prescription drug management.   63 yo F with a chief complaints of bleeding from her dialysis access.  This occurred while she was getting dialysis.  It sounds like this was not noticed for some time.  Will check blood work here.  Patient potassium is mildly elevated at 5.9.  BUN is also mildly elevated.     After about an hour and a half direct pressure I did try to take the patient's dressing down and she had quite significant brisk bleeding upon removal.  Redressed.  I discussed case with Dr. Myron Asters.  He recommended discussion with vascular.  I discussed case with Dr. Fulton Job.  He recommended a figure-of-eight stitch placed with Monocryl.  I placed 1 stitch and the patient has vascular surgery had come down to check on her.  He placed a second stitch with good hemostasis.  Recommending fistulogram  today.  Nephrology notified that patient's fistula is no longer bleeding and she is available for dialysis.  Patient likely home post dialysis.  The patients results and plan were reviewed and discussed.   Any x-rays performed were independently reviewed by myself.   Differential diagnosis were considered with the presenting HPI.  Medications  Chlorhexidine  Gluconate Cloth 2 % PADS 6 each ( Topical Automatically Held 12/18/23 0600)  Heparin  (Porcine) in NaCl 1000-0.9 UT/500ML-% SOLN (500 mLs  Given 12/10/23 1404)  lidocaine  (PF) (XYLOCAINE ) 1 % injection (10 mLs Other Given 12/10/23 1407)  iodixanol  (VISIPAQUE ) 320 MG/ML injection (20 mLs  Given 12/10/23 1424)  sodium zirconium cyclosilicate (LOKELMA) packet 10 g (has no administration in time range)  lidocaine -EPINEPHrine  (XYLOCAINE  W/EPI) 2 %-1:200000 (PF) injection 10 mL (10 mLs Intradermal Given by Other 12/10/23 0853)    Vitals:   12/10/23 1405 12/10/23 1410 12/10/23 1415 12/10/23 1420  BP: (!) 165/73 (!) 176/74 (!) 191/86 (!) 165/72  Pulse: 68 71 68 69  Resp: 17 (!) 21 17 (!) 24  Temp:      TempSrc:      SpO2: 98% 98% 98% 97%  Weight:      Height:        Final diagnoses:  Bleeding from dialysis shunt, initial encounter (HCC)    Admission/ observation were discussed with the admitting physician, patient and/or family and they are comfortable with the plan.  Final Clinical Impression(s) / ED Diagnoses Final diagnoses:  Bleeding from dialysis shunt, initial encounter [facility])    Rx / DC Orders ED Discharge Orders     None         Albertus Hughs, DO 12/10/23 1535

## 2023-12-10 NOTE — Consult Note (Signed)
 Hospital Consult    Reason for Consult:  Bleeding left arm AVF MRN #:  [id number]  History of Present Illness: This is a 63 y.o. female with end-stage renal disease that vascular surgery has been consulted for bleeding from left arm AV fistula.  Presented from dialysis.  Bleeding from the needle hole.  Left arm basilic vein fistula.  She states this has been working well until today.  Past Medical History:  Diagnosis Date   Adenopathy, right inguinal and external iliac 04/04/2014   Anemia    Anxiety    CAP (community acquired pneumonia) 03/13/2015   Carpal tunnel syndrome 11/28/2014   Bilateral   Chronic pancreatitis (HCC)    Depression    Diabetes mellitus type I (HCC) dx'd 1977   Diabetic osteomyelitis (HCC)    right great toe/progress notes 09/23/2012   Diabetic retinopathy (HCC)    ESRD (end stage renal disease) (HCC)    ESRD- Hemo MWF- [facility]   Fatty liver 04/04/2014   Glaucoma    HTN (hypertension)    Hypercholesteremia    Hypothyroidism    Macular degeneration, bilateral    Migraines 1980's   "when I was in my 20's" (03/14/2015)   Peripheral neuropathy    Pneumonia 1993   PTSD (post-traumatic stress disorder)    Stroke (HCC) ? date & 09/22/2012   TIA's - no resuidual   TIA (transient ischemic attack) 09/22/2012   Maximo Spar 10/25/2012    Past Surgical History:  Procedure Laterality Date   A/V FISTULAGRAM N/A 08/31/2018   Procedure: A/V FISTULAGRAM - Left arm;  Surgeon: Young Hensen, MD;  Location: [facility] INVASIVE CV LAB;  Service: Cardiovascular;  Laterality: N/A;   A/V FISTULAGRAM N/A 11/04/2018   Procedure: A/V FISTULAGRAM - Left Arm;  Surgeon: Young Hensen, MD;  Location: [facility] INVASIVE CV LAB;  Service: Cardiovascular;  Laterality: N/A;   AMPUTATION Right 07/12/2015   Procedure: Right Transmetatarsal amputation;  Surgeon: Timothy Ford, MD;  Location: [facility] OR;  Service: Orthopedics;  Laterality: Right;   BASCILIC VEIN TRANSPOSITION Left 12/31/2017    Procedure: FIRST STAGE BASILIC VEIN TRANSPOSITION LEFT ARM;  Surgeon: Arvil Lauber, MD;  Location: [facility] OR;  Service: Vascular;  Laterality: Left;   BASCILIC VEIN TRANSPOSITION Left 02/21/2018   Procedure: SECOND STAGE BASILIC VEIN TRANSPOSITION, LEFT UPPER ARM;  Surgeon: Young Hensen, MD;  Location: [facility] OR;  Service: Vascular;  Laterality: Left;   CATARACT EXTRACTION, BILATERAL     CESAREAN SECTION  1989; 1995   EYE SURGERY Bilateral    "hemorrhaged behind my eye"   I & D EXTREMITY Right multiple   "big toe"   INCISION AND DRAINAGE ABSCESS Left 12/08/2018   Procedure: Incision And Drainage of Left Axillary Abscess;  Surgeon: Young Hensen, MD;  Location: [facility] OR;  Service: Vascular;  Laterality: Left;   PERIPHERAL VASCULAR INTERVENTION Left 08/31/2018   Procedure: PERIPHERAL VASCULAR INTERVENTION;  Surgeon: Young Hensen, MD;  Location: [facility] INVASIVE CV LAB;  Service: Cardiovascular;  Laterality: Left;   PERIPHERAL VASCULAR INTERVENTION Left 11/04/2018   Procedure: PERIPHERAL VASCULAR INTERVENTION;  Surgeon: Young Hensen, MD;  Location: [facility] INVASIVE CV LAB;  Service: Cardiovascular;  Laterality: Left;  arm fistula   PERIPHERALLY INSERTED CENTRAL CATHETER INSERTION Right 08/31/2012   PICC LINE REMOVAL ([facility] HX) Right 10/12/2012   Maximo Spar 10/12/2012   REFRACTIVE SURGERY Bilateral 2013   TOE AMPUTATION Right 11/2012   "big toe"   TONSILLECTOMY  1965   TOTAL THYROIDECTOMY  2012   TUBAL LIGATION  1995    Allergies  Allergen Reactions   Erythromycin Anaphylaxis   Isosorbide    Norvasc  [Amlodipine ] Shortness Of Breath and Swelling   Vancomycin  Shortness Of Breath and Other (See Comments)    Red Man Syndrome   Sulfa Antibiotics Other (See Comments)   Nsaids Other (See Comments)    Avoid due to CKD   Wellbutrin [Bupropion] Other (See Comments)    Extreme neurological effects     Prior to Admission medications   Medication Sig Start Date End Date Taking? Authorizing  Provider  Ascorbic Acid (VITAMIN C GUMMIE PO) Take 2 tablets by mouth daily.    [provider]  Cholecalciferol  (VITAMIN D -3) 125 MCG (5000 UT) TABS Take 5,000 Units by mouth daily.    [provider]  clonazePAM  (KLONOPIN ) 1 MG tablet Take one tab bed time as needed for insomnia one before dialysis for severe anxiety. 10/21/23   Arfeen, Bronson Canny, MD  dorzolamide -timolol  (COSOPT ) 22.3-6.8 MG/ML ophthalmic solution Place 1 drop into both eyes 2 (two) times daily.     [provider]  DULoxetine  (CYMBALTA ) 30 MG capsule Take 1 capsule (30 mg total) by mouth daily. 10/21/23 01/19/24  Arfeen, Bronson Canny, MD  DULoxetine  (CYMBALTA ) 60 MG capsule Take 1 capsule (60 mg total) by mouth daily. 10/21/23   Arfeen, Bronson Canny, MD  ferric citrate  (AURYXIA ) 1 GM 210 MG(Fe) tablet Take 420 mg by mouth See admin instructions. 2-3  tabs with meals, 1 tab with snacks    [provider]  glucose blood test strip one strip (1 each total) by Other route 4 (four) times daily. Use as directed. Pharmacy, please dispense this brand of blood glucose test strips: Bayer Contour Next 03/09/17   [provider]  insulin  glargine (LANTUS ) 100 UNIT/ML injection Inject 40 Units into the skin 2 (two) times daily.    [provider]  insulin  lispro (HUMALOG ) 100 UNIT/ML injection Inject 20 Units into the skin See admin instructions. Inject up to 20 units subcutaneously three times daily before meals if needed Per sliding scale as directed    [provider]  levothyroxine  (SYNTHROID , LEVOTHROID) 150 MCG tablet Take 150 mcg by mouth daily before breakfast.    [provider]  lisinopril  (PRINIVIL ,ZESTRIL ) 20 MG tablet Take 20 mg by mouth at bedtime.  10/24/17   [provider]  multivitamin (RENA-VIT) TABS tablet Take 1 tablet by mouth at bedtime.     [provider]  NIFEdipine (ADALAT CC) 30 MG 24 hr tablet Take by mouth. Patient not taking: Reported on 10/19/2023  05/21/21   [provider]  ondansetron  (ZOFRAN  ODT) 4 MG disintegrating tablet Take 1 tablet (4 mg total) by mouth every 8 (eight) hours as needed for nausea or vomiting. 12/02/19   Jerilynn Montenegro, MD  pantoprazole  (PROTONIX ) 40 MG tablet Take 1 tablet (40 mg total) by mouth daily. 10/19/23   Lajuan Pila, MD  RESTASIS  0.05 % ophthalmic emulsion Place 1 drop into both eyes 2 (two) times daily as needed (for dry/irritated eyes).  12/29/17   [provider]  rosuvastatin  (CRESTOR ) 40 MG tablet Take 40 mg by mouth daily.    [provider]  traZODone  (DESYREL ) 100 MG tablet Take 1 tablet (100 mg total) by mouth at bedtime as needed for sleep. 10/21/23   Arfeen, Bronson Canny, MD    Social History   Socioeconomic History   Marital status: Single    Spouse name:  Not on file   Number of children: 3   Years of education: Not on file   Highest education level: Not on file  Occupational History   Not on file  Tobacco Use   Smoking status: Never   Smokeless tobacco: Never  Vaping Use   Vaping status: Never Used  Substance and Sexual Activity   Alcohol use: No    Alcohol/week: 0.0 standard drinks of alcohol   Drug use: No   Sexual activity: Not Currently  Other Topics Concern   Not on file  Social History Narrative   Lives in [place] with daughter and father. Completely independent in ADLs, IADLs.   Social Drivers of Health   Financial Resource Strain: Medium Risk (08/15/2023)   Received from [facility]   Overall Financial Resource Strain (CARDIA)    Difficulty of Paying Living Expenses: Somewhat hard  Food Insecurity: Food Insecurity Present (08/15/2023)   Received from [facility]   Hunger Vital Sign    Worried About Running Out of Food in the Last Year: Sometimes true    Ran Out of Food in the Last Year: Sometimes true  Transportation Needs: No Transportation Needs (08/15/2023)   Received from [facility] - Transportation    Lack of Transportation  (Medical): No    Lack of Transportation (Non-Medical): No  Physical Activity: Sufficiently Active (08/15/2023)   Received from [facility]   Exercise Vital Sign    Days of Exercise per Week: 4 days    Minutes of Exercise per Session: 40 min  Stress: Stress Concern Present (10/09/2023)   Received from [facility] - Occupational Stress Questionnaire    Feeling of Stress : Very much  Social Connections: Somewhat Isolated (08/15/2023)   Received from [facility]   Social Network    How would you rate your social network (family, work, friends)?: Restricted participation with some degree of social isolation  Intimate Partner Violence: Not At Risk (10/09/2023)   Received from [facility]   HITS    Over the last 12 months how often did your partner physically hurt you?: Never    Over the last 12 months how often did your partner insult you or talk down to you?: Never    Over the last 12 months how often did your partner threaten you with physical harm?: Never    Over the last 12 months how often did your partner scream or curse at you?: Never     Family History  Problem Relation Age of Onset   Depression Mother    Diabetes Mother    Alcohol abuse Father    Alcohol abuse Sister    Diabetes Maternal Grandmother    Colon cancer Neg Hx    Esophageal cancer Neg Hx    Stomach cancer Neg Hx     ROS: [x]  Positive   [ ]  Negative   [ ]  All sytems reviewed and are negative  Cardiovascular: []  chest pain/pressure []  palpitations []  SOB lying flat []  DOE []  pain in legs while walking []  pain in legs at rest []  pain in legs at night []  non-healing ulcers []  hx of DVT []  swelling in legs  Pulmonary: []  productive cough []  asthma/wheezing []  home O2  Neurologic: []  weakness in []  arms []  legs []  numbness in []  arms []  legs []  hx of CVA []  mini stroke [] difficulty speaking or slurred speech []  temporary loss of vision in one eye []   dizziness  Hematologic: []  hx of cancer []  bleeding problems []  problems with blood clotting easily  Endocrine:   []  diabetes []  thyroid disease  GI []  vomiting blood []  blood in stool  GU: []  CKD/renal failure []  HD--[]  M/W/F or []  T/T/S []  burning with urination []  blood in urine  Psychiatric: []  anxiety []  depression  Musculoskeletal: []  arthritis []  joint pain  Integumentary: []  rashes []  ulcers  Constitutional: []  fever []  chills   Physical Examination  Vitals:   12/10/23 0715 12/10/23 0725  BP: 138/72 135/68  Pulse: (!) 58 (!) 58  Resp: (!) 22 20  Temp:  97.6 F (36.4 C)  SpO2: 99% 100%   Body mass index is 26.22 kg/m.  General:  WDWN in NAD Gait: Not observed HENT: WNL, normocephalic Pulmonary: normal non-labored breathing Cardiac: regular, without  Murmurs, rubs or gallops Abdomen:  soft, NT/ND Vascular Exam/Pulses: Suture placed in bleeding needle hole at bedside over fistula Thrill in left arm basilic vein fistula  CBC    Component Value Date/Time   WBC 9.2 12/10/2023 0713   RBC 3.18 (L) 12/10/2023 0713   HGB 9.8 (L) 12/10/2023 0713   HCT 31.2 (L) 12/10/2023 0713   PLT 267 12/10/2023 0713   MCV 98.1 12/10/2023 0713   MCH 30.8 12/10/2023 0713   MCHC 31.4 12/10/2023 0713   RDW 14.0 12/10/2023 0713   LYMPHSABS 1.7 12/10/2023 0713   MONOABS 0.8 12/10/2023 0713   EOSABS 0.8 (H) 12/10/2023 0713   BASOSABS 0.1 12/10/2023 0713    BMET    Component Value Date/Time   NA 134 (L) 12/10/2023 0713   K 5.9 (H) 12/10/2023 0713   CL 101 12/10/2023 0713   CO2 25 12/10/2023 0713   GLUCOSE 151 (H) 12/10/2023 0713   BUN 44 (H) 12/10/2023 0713   CREATININE 6.59 (H) 12/10/2023 0713   CREATININE 0.80 09/14/2012 1344   CALCIUM  8.4 (L) 12/10/2023 0713   GFRNONAA 7 (L) 12/10/2023 0713   GFRAA 9 (L) 12/02/2019 1003    COAGS: Lab Results  Component Value Date   INR 1.15 07/09/2015   INR 1.07 04/02/2014     Non-Invasive Vascular Imaging:       ASSESSMENT/PLAN: This is a 63 y.o. female  ith end-stage renal disease that vascular surgery was consulted for bleeding from left arm AV fistula.  Presented from dialysis.  Bleeding from the needle hole.  Left arm basilic vein fistula.    I placed a figure-of-eight suture in the needle hole at bedside and this is now hemostatic.  Will add on for left arm fistulogram today with Dr. Edgardo Goodwill in the Cath Lab.  Consent order placed.  Young Hensen, MD [facility] [place] Office: [phone]  Young Hensen

## 2023-12-10 NOTE — ED Notes (Signed)
 Pt in bed, pt awake and oriented, pt denies weakness, moving all extremities, pt has pressure dressing on L arm

## 2023-12-10 NOTE — Consult Note (Signed)
 Renal Service Consult Note [facility]  Suzanne Allen 12/10/2023 Suzanne Sandifer, MD Requesting Physician: Dr. Inga Manges  Reason for Consult: ESRD pt w/ hyperkalemia and prolonged AV graft bleeding HPI: The patient is a 63 y.o. year-old w/ PMH as below who presented to ED w/ excessive bleeding from her AVF needle hole. She got OP dialysis. In ED VSS. K+ 5.9. VVS called and placed a suture to stop the bleeding. VVS is planning for fistulogram this afternoon. We are asked to see for dialysis.    Pt seen in ED room. No c/o's. No SOB, no leg swelling.    ROS - denies CP, no joint pain, no HA, no blurry vision, no rash, no diarrhea, no nausea/ vomiting  PMH: Anemia Anxiety  Chronic pancreatitis Depression DM type 1 ESRD on HD Glaucoma HTN HL Hypothyroidism PTSD H/o CVA  Past Surgical History  Past Surgical History:  Procedure Laterality Date   A/V FISTULAGRAM N/A 08/31/2018   Procedure: A/V FISTULAGRAM - Left arm;  Surgeon: Young Hensen, MD;  [facility]: [facility] INVASIVE CV LAB;  Service: Cardiovascular;  Laterality: N/A;   A/V FISTULAGRAM N/A 11/04/2018   Procedure: A/V FISTULAGRAM - Left Arm;  Surgeon: Young Hensen, MD;  Location: [facility] INVASIVE CV LAB;  Service: Cardiovascular;  Laterality: N/A;   AMPUTATION Right 07/12/2015   Procedure: Right Transmetatarsal amputation;  Surgeon: Timothy Ford, MD;  [facility] OR;  Service: Orthopedics;  Laterality: Right;   BASCILIC VEIN TRANSPOSITION Left 12/31/2017   Procedure: FIRST STAGE BASILIC VEIN TRANSPOSITION LEFT ARM;  Surgeon: Arvil Lauber, MD;  Location: [facility] OR;  Service: Vascular;  Laterality: Left;   BASCILIC VEIN TRANSPOSITION Left 02/21/2018   Procedure: SECOND STAGE BASILIC VEIN TRANSPOSITION, LEFT UPPER ARM;  Surgeon: Young Hensen, MD;  Location: [facility] OR;  Service: Vascular;  Laterality: Left;   CATARACT EXTRACTION, BILATERAL     CESAREAN SECTION  1989; 1995   EYE SURGERY Bilateral     "hemorrhaged behind my eye"   I & D EXTREMITY Right multiple   "big toe"   INCISION AND DRAINAGE ABSCESS Left 12/08/2018   Procedure: Incision And Drainage of Left Axillary Abscess;  Surgeon: Young Hensen, MD;  Location: [facility] OR;  Service: Vascular;  Laterality: Left;   PERIPHERAL VASCULAR INTERVENTION Left 08/31/2018   Procedure: PERIPHERAL VASCULAR INTERVENTION;  Surgeon: Young Hensen, MD;  Location: [facility] INVASIVE CV LAB;  Service: Cardiovascular;  Laterality: Left;   PERIPHERAL VASCULAR INTERVENTION Left 11/04/2018   Procedure: PERIPHERAL VASCULAR INTERVENTION;  Surgeon: Young Hensen, MD;  Location: [facility] INVASIVE CV LAB;  Service: Cardiovascular;  Laterality: Left;  arm fistula   PERIPHERALLY INSERTED CENTRAL CATHETER INSERTION Right 08/31/2012   PICC LINE REMOVAL ([facility] HX) Right 10/12/2012   Maximo Spar 10/12/2012   REFRACTIVE SURGERY Bilateral 2013   TOE AMPUTATION Right 11/2012   "big toe"   TONSILLECTOMY  1965   TOTAL THYROIDECTOMY  2012   TUBAL LIGATION  1995   Family History  Family History  Problem Relation Age of Onset   Depression Mother    Diabetes Mother    Alcohol abuse Father    Alcohol abuse Sister    Diabetes Maternal Grandmother    Colon cancer Neg Hx    Esophageal cancer Neg Hx    Stomach cancer Neg Hx    Social History  reports that she has never smoked. She has never used smokeless tobacco. She reports that she does not drink alcohol and  does not use drugs. Allergies  Allergies  Allergen Reactions   Erythromycin Anaphylaxis   Isosorbide    Norvasc  [Amlodipine ] Shortness Of Breath and Swelling   Vancomycin  Shortness Of Breath and Other (See Comments)    Red Man Syndrome   Sulfa Antibiotics Other (See Comments)   Nsaids Other (See Comments)    Avoid due to CKD   Wellbutrin [Bupropion] Other (See Comments)    Extreme neurological effects    Home medications Prior to Admission medications   Medication Sig Start Date End Date Taking? Authorizing  Provider  Ascorbic Acid (VITAMIN C GUMMIE PO) Take 2 tablets by mouth daily.    [provider]  Cholecalciferol  (VITAMIN D -3) 125 MCG (5000 UT) TABS Take 5,000 Units by mouth daily.    [provider]  clonazePAM  (KLONOPIN ) 1 MG tablet Take one tab bed time as needed for insomnia one before dialysis for severe anxiety. 10/21/23   Arfeen, Bronson Canny, MD  dorzolamide -timolol  (COSOPT ) 22.3-6.8 MG/ML ophthalmic solution Place 1 drop into both eyes 2 (two) times daily.     [provider]  DULoxetine  (CYMBALTA ) 30 MG capsule Take 1 capsule (30 mg total) by mouth daily. 10/21/23 01/19/24  Arfeen, Bronson Canny, MD  DULoxetine  (CYMBALTA ) 60 MG capsule Take 1 capsule (60 mg total) by mouth daily. 10/21/23   Arfeen, Bronson Canny, MD  ferric citrate  (AURYXIA ) 1 GM 210 MG(Fe) tablet Take 420 mg by mouth See admin instructions. 2-3  tabs with meals, 1 tab with snacks    [provider]  glucose blood test strip one strip (1 each total) by Other route 4 (four) times daily. Use as directed. Pharmacy, please dispense this brand of blood glucose test strips: Bayer Contour Next 03/09/17   [provider]  insulin  glargine (LANTUS ) 100 UNIT/ML injection Inject 40 Units into the skin 2 (two) times daily.    [provider]  insulin  lispro (HUMALOG ) 100 UNIT/ML injection Inject 20 Units into the skin See admin instructions. Inject up to 20 units subcutaneously three times daily before meals if needed Per sliding scale as directed    [provider]  levothyroxine  (SYNTHROID , LEVOTHROID) 150 MCG tablet Take 150 mcg by mouth daily before breakfast.    [provider]  lisinopril  (PRINIVIL ,ZESTRIL ) 20 MG tablet Take 20 mg by mouth at bedtime.  10/24/17   [provider]  multivitamin (RENA-VIT) TABS tablet Take 1 tablet by mouth at bedtime.     [provider]  NIFEdipine (ADALAT CC) 30 MG 24 hr tablet Take by mouth. Patient not taking: Reported on 10/19/2023  05/21/21   [provider]  ondansetron  (ZOFRAN  ODT) 4 MG disintegrating tablet Take 1 tablet (4 mg total) by mouth every 8 (eight) hours as needed for nausea or vomiting. 12/02/19   Jerilynn Montenegro, MD  pantoprazole  (PROTONIX ) 40 MG tablet Take 1 tablet (40 mg total) by mouth daily. 10/19/23   Lajuan Pila, MD  RESTASIS  0.05 % ophthalmic emulsion Place 1 drop into both eyes 2 (two) times daily as needed (for dry/irritated eyes).  12/29/17   [provider]  rosuvastatin  (CRESTOR ) 40 MG tablet Take 40 mg by mouth daily.    [provider]  traZODone  (DESYREL ) 100 MG tablet Take 1 tablet (100 mg total) by mouth at bedtime as needed for sleep. 10/21/23   Eduard Grad T, MD     Vitals:   12/10/23 0700 12/10/23 0715 12/10/23 0725 12/10/23 0948  BP: (!) 141/71 138/72 135/68 139/60  Pulse: 61 (!) 58 (!) 58 60  Resp: (!) 22 (!) 22 20 18   Temp:   97.6 F (36.4 C)   TempSrc:   Oral   SpO2: 99% 99% 100% 100%  Weight:      Height:       Exam Gen alert, no distress No rash, cyanosis or gangrene Sclera anicteric, throat clear  No jvd or bruits Chest clear bilat to bases, no rales/ wheezing RRR no MRG Abd soft ntnd no mass or ascites +bs GU defer MS no joint effusions or deformity Ext no LE or UE edema, no other edema Neuro is alert, Ox 3 , nf    LUA AVF +bruit, stitch in place      Renal-related home meds: Auryxia  2 ac tid Lisinopril  20mg  hs Renavite daily Nifedipine CC 30mg  every day Others: trazodone , statin, PPI, T4, insulins, cymbalta , klonopin     OP HD: MWF [facility] 3h  B400   66.3kg   L AVF    Heparin  none Last OP HD 5/28, post wt 69.8kg Has been coming off 2-3 over last 2 wks    Assessment/ Plan: Excessive bleeding AVF access: stitch placed by VVS this am in ED. VVS planning fistulogram this afternoon.   ESRD: on HD MWF. Plan to do HD sometime today or this evening. Low K+ bath.  Hyperkalemia: K+ 5.9, not symptomatic. 1K bath x .  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  Secondary hyperparathyroidism: Ca in range, cont binders  DM Type 1      Rob Avaiah Stempel  MD [facility] 12/10/2023, 9:59 AM  Recent Labs  Lab 12/10/23 0713  HGB 9.8*  CALCIUM  8.4*  CREATININE 6.59*  K 5.9*   Inpatient medications:

## 2023-12-10 NOTE — Op Note (Signed)
 DATE OF SERVICE: 12/10/2023  PATIENT:  Suzanne Allen  63 y.o. female  PRE-OPERATIVE DIAGNOSIS:  prolonged bleeding from left arm fistula  POST-OPERATIVE DIAGNOSIS:  Same  PROCEDURE:   1) left arm fistulagram 2) left arm AVF angioplasty (7x17mm Mustang)  SURGEON:  Surgeons and Role:    * Carlene Che, MD - Primary  ASSISTANT: none  ANESTHESIA:   local  EBL: minimal  BLOOD ADMINISTERED:none  DRAINS: none   LOCAL MEDICATIONS USED:  LIDOCAINE    SPECIMEN:  none  COUNTS: confirmed correct.  TOURNIQUET:  none  PATIENT DISPOSITION:  PACU - hemodynamically stable.   Delay start of Pharmacological VTE agent (>24hrs) due to surgical blood loss or risk of bleeding: no  INDICATION FOR PROCEDURE: Suzanne Allen is a 62 y.o. female with ESRD.  She dialyzes through a left arm brachiobasilic arteriovenous fistula.  She had prolonged bleeding from access today, prompting emergency department evaluation.  After careful discussion of risks, benefits, and alternatives the patient was offered fistulogram. The patient understood and wished to proceed.  OPERATIVE FINDINGS: No central venous stenosis.  No subclavian or axillary vein stenosis.  Previously placed Viabahn stent had edge stenosis at the central margin.  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  The anastomosis appeared patent without significant stenosis.  Will result from angioplasty  DESCRIPTION OF PROCEDURE: After identification of the patient in the pre-operative holding area, the patient was transferred to the operating room. The patient was positioned supine on the operating room table. Anesthesia was induced. The left arm was prepped and draped in standard fashion. A surgical pause was performed confirming correct patient, procedure, and operative location.  Micropuncture technique was used to access the left arm fistula.  Seldinger technique was used to introduce a micropuncture sheath into the fistula.   Fistulogram was performed in stations.  Prior stenting was noted, edge stent stenosis was noted.  Lesion was crossed with a Bentson wire.  Access was upsized to 6 [place].  7 x 20 mm Mustang balloon was used to angioplasty the in-stent restenosis.  Good technical result was achieved on follow-up angiogram.  The thrill was improved after intervention.  A pursestring suture was placed around the access site.  All endovascular equipment was removed.  The pursestring suture was secured.  Hemostasis was achieved.  A bandage was applied.  Upon completion of the case instrument and sharps counts were confirmed correct. The patient was transferred to the PACU in good condition. I was present for all portions of the procedure.  FOLLOW UP PLAN: Follow-up with VVS in 4 weeks with AV fistula duplex  [name]. Edgardo Goodwill, MD [facility] Phone Number: [phone] 12/10/2023 2:41 PM

## 2023-12-11 ENCOUNTER — Encounter (HOSPITAL_COMMUNITY): Payer: Self-pay | Admitting: Vascular Surgery

## 2023-12-15 ENCOUNTER — Telehealth: Payer: Self-pay | Admitting: Gastroenterology

## 2023-12-15 NOTE — Telephone Encounter (Addendum)
 Procedure:Colonoscopy/Endoscopy Procedure date: 12/23/23 Procedure location: [facility] Arrival Time: 6:00 am Spoke with the patient Y/N:   No, I left a detailed message 12/15/23 @ 10:10 am for the patient to return call No, I left a detailed message 12/16/23 @ 10:15 am for the patient to return call 12/17/23 mychart message sent  Colon cancelled  Any prep concerns? ___  Has the patient obtained the prep from the pharmacy ? ___ Do you have a care partner and transportation: ___ Any additional concerns? ___

## 2023-12-20 ENCOUNTER — Telehealth: Payer: Self-pay | Admitting: Gastroenterology

## 2023-12-20 NOTE — Telephone Encounter (Signed)
 Called and spoke with patient to confirm that she wants to cancel EGD/colon at [facility] on 12/23/23. Patient does not wish to reschedule at this time.

## 2023-12-20 NOTE — Telephone Encounter (Signed)
 Patient requesting to cancel upcoming procedure for 6/12. States she has f/u with her kidney dr and they have resolved issue. Please advise.   Thank you

## 2023-12-23 ENCOUNTER — Ambulatory Visit (HOSPITAL_COMMUNITY): Admit: 2023-12-23 | Admitting: Gastroenterology

## 2023-12-23 ENCOUNTER — Encounter (HOSPITAL_COMMUNITY): Payer: Self-pay

## 2023-12-23 SURGERY — COLONOSCOPY
Anesthesia: Monitor Anesthesia Care

## 2023-12-30 ENCOUNTER — Other Ambulatory Visit: Payer: Self-pay

## 2023-12-30 DIAGNOSIS — Z992 Dependence on renal dialysis: Secondary | ICD-10-CM

## 2023-12-30 DIAGNOSIS — N186 End stage renal disease: Secondary | ICD-10-CM

## 2024-01-11 ENCOUNTER — Ambulatory Visit (HOSPITAL_COMMUNITY)

## 2024-01-18 ENCOUNTER — Encounter

## 2024-01-18 ENCOUNTER — Encounter (HOSPITAL_COMMUNITY): Payer: Self-pay | Admitting: Psychiatry

## 2024-01-18 ENCOUNTER — Telehealth (HOSPITAL_COMMUNITY): Admitting: Psychiatry

## 2024-01-18 DIAGNOSIS — F4310 Post-traumatic stress disorder, unspecified: Secondary | ICD-10-CM

## 2024-01-18 DIAGNOSIS — F410 Panic disorder [episodic paroxysmal anxiety] without agoraphobia: Secondary | ICD-10-CM | POA: Diagnosis not present

## 2024-01-18 DIAGNOSIS — F330 Major depressive disorder, recurrent, mild: Secondary | ICD-10-CM

## 2024-01-18 MED ORDER — DULOXETINE HCL 30 MG PO CPEP: 30.0000 mg | ORAL_CAPSULE | Freq: Every day | ORAL | 0 refills | Status: DC

## 2024-01-18 MED ORDER — TRAZODONE HCL 150 MG PO TABS: 150.0000 mg | ORAL_TABLET | Freq: Every evening | ORAL | 0 refills | Status: DC | PRN

## 2024-01-18 MED ORDER — DULOXETINE HCL 60 MG PO CPEP
60.0000 mg | ORAL_CAPSULE | Freq: Every day | ORAL | 2 refills | Status: DC
Start: 2024-01-18 — End: 2024-04-20

## 2024-01-18 MED ORDER — CLONAZEPAM 1 MG PO TABS: ORAL_TABLET | ORAL | 1 refills | Status: DC
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
## 2024-02-05 ENCOUNTER — Encounter (HOSPITAL_COMMUNITY): Payer: Self-pay

## 2024-02-05 DIAGNOSIS — F330 Major depressive disorder, recurrent, mild: Secondary | ICD-10-CM

## 2024-02-05 DIAGNOSIS — F410 Panic disorder [episodic paroxysmal anxiety] without agoraphobia: Secondary | ICD-10-CM

## 2024-02-08 MED ORDER — CLONAZEPAM 1 MG PO TABS: ORAL_TABLET | ORAL | 1 refills | Status: DC

## 2024-02-08 NOTE — Telephone Encounter (Signed)
 I sent a new prescription of Klonopin  to her pharmacy with 1 more additional refill.  Please call the patient if she need anything else.  Thank you

## 2024-04-19 ENCOUNTER — Telehealth (HOSPITAL_COMMUNITY): Admitting: Psychiatry

## 2024-04-20 ENCOUNTER — Encounter (HOSPITAL_COMMUNITY): Payer: Self-pay | Admitting: Psychiatry

## 2024-04-20 ENCOUNTER — Other Ambulatory Visit (HOSPITAL_COMMUNITY): Payer: Self-pay | Admitting: Psychiatry

## 2024-04-20 ENCOUNTER — Telehealth (HOSPITAL_COMMUNITY): Admitting: Psychiatry

## 2024-04-20 VITALS — Wt 148.0 lb

## 2024-04-20 DIAGNOSIS — F4310 Post-traumatic stress disorder, unspecified: Secondary | ICD-10-CM

## 2024-04-20 DIAGNOSIS — F410 Panic disorder [episodic paroxysmal anxiety] without agoraphobia: Secondary | ICD-10-CM

## 2024-04-20 DIAGNOSIS — F330 Major depressive disorder, recurrent, mild: Secondary | ICD-10-CM

## 2024-04-20 MED ORDER — TRAZODONE HCL 150 MG PO TABS: 150.0000 mg | ORAL_TABLET | Freq: Every evening | ORAL | 0 refills | Status: DC | PRN

## 2024-04-20 MED ORDER — CLONAZEPAM 1 MG PO TABS: ORAL_TABLET | ORAL | 1 refills | Status: DC

## 2024-04-20 MED ORDER — DULOXETINE HCL 60 MG PO CPEP: 60.0000 mg | ORAL_CAPSULE | Freq: Every day | ORAL | 0 refills | Status: DC

## 2024-04-20 MED ORDER — DULOXETINE HCL 30 MG PO CPEP: 30.0000 mg | ORAL_CAPSULE | Freq: Every day | ORAL | 0 refills | Status: DC
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
## 2024-04-25 ENCOUNTER — Encounter (HOSPITAL_COMMUNITY): Payer: Self-pay

## 2024-04-28 ENCOUNTER — Encounter (HOSPITAL_COMMUNITY): Payer: Self-pay

## 2024-05-01 ENCOUNTER — Other Ambulatory Visit (HOSPITAL_COMMUNITY): Payer: Self-pay | Admitting: *Deleted

## 2024-05-01 MED ORDER — HYDROXYZINE PAMOATE 25 MG PO CAPS: 25.0000 mg | ORAL_CAPSULE | Freq: Every evening | ORAL | 2 refills | Status: DC | PRN

## 2024-05-01 NOTE — Telephone Encounter (Signed)
 She can try hydroxyzine 25 mg as needed for insomnia.  She need to stop the trazodone  and see if side effects subsided with the trazodone .

## 2024-05-03 ENCOUNTER — Encounter (HOSPITAL_COMMUNITY): Payer: Self-pay

## 2024-05-04 MED ORDER — MIRTAZAPINE 15 MG PO TABS: 15.0000 mg | ORAL_TABLET | Freq: Every day | ORAL | 0 refills | Status: DC

## 2024-05-04 NOTE — Telephone Encounter (Signed)
 If she is not sleeping because of numbness and paresthesia, gabapentin has advantage to help the symptoms and she able to go to sleep.  Other option is to back on trazodone .

## 2024-05-04 NOTE — Telephone Encounter (Signed)
 I spoke to the patient.  She believes pain and paresthesia is caused by trazodone  and either she can reduce the dose of trazodone  or try a new medication.  We tried hydroxyzine that keep her up and she had tried gabapentin in the past that did not work.  Recommended trial low-dose mirtazapine and she agreed to try and if that did not work then she will cut down the trazodone  back to 100.  I will call the prescription to the pharmacy.

## 2024-05-04 NOTE — Telephone Encounter (Signed)
 See telephone encounter.

## 2024-05-04 NOTE — Telephone Encounter (Signed)
 She reported in the past that sleep could be an issue because of numbness, paresthesia and pain.  If trazodone  did not work and recently hydroxyzine did not work then she can try gabapentin.  If she agree we can try 100 mg to 300 mg at bedtime provide she can tolerate.  If agree please call the prescription to her pharmacy.

## 2024-06-13 NOTE — Progress Notes (Signed)
 Attempted to contact the patient to discuss upcoming appointments and recommended health maintenance screenings for the year.  A [facility] Specialist has reviewed the patient's chart to identify any gaps in care and outstanding preventive health needs. However, we were unable to reach the patient to review or schedule the necessary follow-up appointments and screenings.  NA  Left message: yes   MyChart message sent: yes   Voicemail was left including callback details and our hours of operation.  Comments:  Appointments which have been scheduled    Aug 08, 2024 10:40 AM Office Visit with Norleen SHAUNNA Lipoma, MD [facility] (--) [address] [place] [address] [phone]   Aug 08, 2024 2:30 PM Follow Up Appointment with Debby JINNY Hoist, DPM [facility]) (--) [address] [place] [address] [phone]

## 2024-07-18 ENCOUNTER — Telehealth (HOSPITAL_COMMUNITY): Admitting: Psychiatry

## 2024-07-18 ENCOUNTER — Encounter (HOSPITAL_COMMUNITY): Payer: Self-pay | Admitting: Psychiatry

## 2024-07-18 VITALS — Wt 148.0 lb

## 2024-07-18 DIAGNOSIS — F4310 Post-traumatic stress disorder, unspecified: Secondary | ICD-10-CM | POA: Diagnosis not present

## 2024-07-18 DIAGNOSIS — F410 Panic disorder [episodic paroxysmal anxiety] without agoraphobia: Secondary | ICD-10-CM

## 2024-07-18 DIAGNOSIS — F330 Major depressive disorder, recurrent, mild: Secondary | ICD-10-CM | POA: Diagnosis not present

## 2024-07-18 MED ORDER — CLONAZEPAM 1 MG PO TABS: ORAL_TABLET | ORAL | 1 refills | Status: AC

## 2024-07-18 MED ORDER — DULOXETINE HCL 60 MG PO CPEP: 60.0000 mg | ORAL_CAPSULE | Freq: Every day | ORAL | 0 refills | Status: AC

## 2024-07-18 MED ORDER — TRAZODONE HCL 150 MG PO TABS: 150.0000 mg | ORAL_TABLET | Freq: Every evening | ORAL | 0 refills | Status: AC | PRN
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
## 2024-07-24 ENCOUNTER — Encounter (HOSPITAL_COMMUNITY): Payer: Self-pay

## 2024-07-24 NOTE — Telephone Encounter (Signed)
 She can take the Klonopin  1 mg at bedtime and second pill before the dialysis.  Please call the pharmacy.

## 2024-07-24 NOTE — Telephone Encounter (Signed)
 I have called pharmacy to clarify.

## 2024-07-25 ENCOUNTER — Encounter (HOSPITAL_COMMUNITY): Payer: Self-pay

## 2024-07-27 ENCOUNTER — Ambulatory Visit (HOSPITAL_COMMUNITY)
Admission: RE | Admit: 2024-07-27 | Discharge: 2024-07-27 | Disposition: A | Discharge status: Home / Self Care | Attending: Vascular Surgery | Admitting: Vascular Surgery

## 2024-07-27 ENCOUNTER — Encounter (HOSPITAL_COMMUNITY): Admission: RE | Disposition: A | Payer: Self-pay | Source: Home / Self Care | Attending: Vascular Surgery

## 2024-07-27 ENCOUNTER — Encounter (HOSPITAL_COMMUNITY): Payer: Self-pay | Admitting: Vascular Surgery

## 2024-07-27 ENCOUNTER — Other Ambulatory Visit: Payer: Self-pay

## 2024-07-27 DIAGNOSIS — Y832 Surgical operation with anastomosis, bypass or graft as the cause of abnormal reaction of the patient, or of later complication, without mention of misadventure at the time of the procedure: Secondary | ICD-10-CM | POA: Diagnosis not present

## 2024-07-27 DIAGNOSIS — T82838A Hemorrhage of vascular prosthetic devices, implants and grafts, initial encounter: Secondary | ICD-10-CM | POA: Insufficient documentation

## 2024-07-27 DIAGNOSIS — I120 Hypertensive chronic kidney disease with stage 5 chronic kidney disease or end stage renal disease: Secondary | ICD-10-CM | POA: Diagnosis not present

## 2024-07-27 DIAGNOSIS — Z992 Dependence on renal dialysis: Secondary | ICD-10-CM | POA: Insufficient documentation

## 2024-07-27 DIAGNOSIS — E1022 Type 1 diabetes mellitus with diabetic chronic kidney disease: Secondary | ICD-10-CM | POA: Diagnosis not present

## 2024-07-27 DIAGNOSIS — N186 End stage renal disease: Secondary | ICD-10-CM | POA: Insufficient documentation

## 2024-07-27 DIAGNOSIS — I871 Compression of vein: Secondary | ICD-10-CM | POA: Insufficient documentation

## 2024-07-27 DIAGNOSIS — T82858A Stenosis of vascular prosthetic devices, implants and grafts, initial encounter: Secondary | ICD-10-CM | POA: Diagnosis not present

## 2024-07-27 DIAGNOSIS — Z794 Long term (current) use of insulin: Secondary | ICD-10-CM | POA: Insufficient documentation

## 2024-07-27 HISTORY — PX: VENOUS ANGIOPLASTY: CATH118376

## 2024-07-27 HISTORY — PX: A/V FISTULAGRAM: CATH118298

## 2024-07-27 LAB — GLUCOSE, CAPILLARY: Glucose-Capillary: 174 mg/dL — ABNORMAL HIGH (ref 70–99)

## 2024-07-27 MED ORDER — HEPARIN (PORCINE) IN NACL 1000-0.9 UT/500ML-% IV SOLN
INTRAVENOUS | Status: DC | PRN
  Administered 2024-07-27: 500 mL

## 2024-07-27 MED ORDER — LIDOCAINE HCL (PF) 1 % IJ SOLN
INTRAMUSCULAR | Status: AC
  Filled 2024-07-27: qty 30

## 2024-07-27 MED ORDER — IODIXANOL 320 MG/ML IV SOLN
INTRAVENOUS | Status: DC | PRN
  Administered 2024-07-27: 25 mL

## 2024-07-27 MED ORDER — LIDOCAINE HCL (PF) 1 % IJ SOLN
INTRAMUSCULAR | Status: DC | PRN
  Administered 2024-07-27: 2 mL via INTRADERMAL

## 2024-07-27 NOTE — Op Note (Signed)
" ° ° °  Patient name: Suzanne Allen MRN: [id number] DOB: [birth year] Sex: female  07/27/2024 Pre-operative Diagnosis: Stage renal disease, malfunction left arm AV fistula with increased bleeding after dialysis Post-operative diagnosis:  Same Surgeon:  Penne BROCKS. Sheree, MD Procedure Performed: 1.  Percutaneous ultrasound-guided cannulation left arm AV fistula 2.  Left upper arm AV fistulogram 3.  Plain balloon angioplasty in-stent restenosis left arm AV fistula with 7 mm Mustang  Indications: 64 year old female with history of end-stage renal disease dialyzing via basilic vein fistula of her medial upper arm.  She has undergone stenting as well as subsequent reintervention for in-stent restenosis in the past.  She now has increased bleeding time at dialysis and is indicated for fistulogram with possible intervention.  Findings: There was again in-stent restenosis.  There was approximately 90% stenosis of the distal edge of the stent and 50% proximally.  This was reduced to less than 20% after balloon angioplasty  And the thrill was improved at completion.  Fistula remains amenable to future percutaneous intervention.   Procedure:  The patient was identified in the holding area and taken to the heart and vascular procedure room.  The patient was then placed supine on the table and prepped and draped in the usual sterile fashion.  A time out was called.  Ultrasound was used to evaluate the left arm AV fistula.  The area was anesthetized 1% lidocaine  and cannulated with a micropuncture needle followed by wire and a sheath.  An ultrasound image was saved to permanent record.  Left upper extremity fistulogram was performed with the above findings we placed a Bentson wire followed by a 6 French sheath.  A 7 mm balloon was used to perform balloon angioplasty of the proximal and distal stenosis of the stent and with the balloon inflated we perform retrograde angiography which demonstrated patency of the proximal  fistula.  At completion the stenosis was reduced to less than 20% with flow through the fistula and improved thrill where previously the fistula was pulsatile.  The wire and sheath were then removed and the cannulation site was suture-ligated with 4 Monocryl suture.  The patient tolerated the procedure well without immediate complication.  Contrast: 25cc  Beautiful Pensyl C. Sheree, MD [facility] Office: [phone] Pager: [phone]   "

## 2024-07-27 NOTE — H&P (Signed)
 " H&P   History of Present Illness: This is a 64 y.o. female history of end-stage renal disease on dialysis via left upper arm AV fistula which has undergone previous balloon angioplasty.  She does have recurrent increased bleeding on 3 separate occasions on dialysis with notable pulsatility on exam.  She is here today to discuss her options moving forward.  Past Medical History:  Diagnosis Date   Adenopathy, right inguinal and external iliac 04/04/2014   Anemia    Anxiety    CAP (community acquired pneumonia) 03/13/2015   Carpal tunnel syndrome 11/28/2014   Bilateral   Chronic pancreatitis (HCC)    Depression    Diabetes mellitus type I (HCC) dx'd 1977   Diabetic osteomyelitis (HCC)    right great toe/progress notes 09/23/2012   Diabetic retinopathy (HCC)    ESRD (end stage renal disease) (HCC)    ESRD- Hemo MWF- [facility]   Fatty liver 04/04/2014   Glaucoma    HTN (hypertension)    Hypercholesteremia    Hypothyroidism    Macular degeneration, bilateral    Migraines 1980's   when I was in my 20's (03/14/2015)   Peripheral neuropathy    Pneumonia 1993   PTSD (post-traumatic stress disorder)    Stroke (HCC) ? date & 09/22/2012   TIA's - no resuidual   TIA (transient ischemic attack) 09/22/2012   thelbert 10/25/2012    Past Surgical History:  Procedure Laterality Date   A/V FISTULAGRAM N/A 08/31/2018   Procedure: A/V FISTULAGRAM - Left arm;  Surgeon: Gretta Lonni PARAS, MD;  Location: [facility] INVASIVE CV LAB;  Service: Cardiovascular;  Laterality: N/A;   A/V FISTULAGRAM N/A 11/04/2018   Procedure: A/V FISTULAGRAM - Left Arm;  Surgeon: Gretta Lonni PARAS, MD;  Location: [facility] INVASIVE CV LAB;  Service: Cardiovascular;  Laterality: N/A;   A/V FISTULAGRAM Left 12/10/2023   Procedure: A/V Fistulagram;  Surgeon: Magda Debby SAILOR, MD;  Location: [facility] INVASIVE CV LAB;  Service: Cardiovascular;  Laterality: Left;   A/V SHUNT INTERVENTION Left 12/10/2023   Procedure: A/V SHUNT INTERVENTION;   Surgeon: Magda Debby SAILOR, MD;  Location: [facility] INVASIVE CV LAB;  Service: Cardiovascular;  Laterality: Left;   AMPUTATION Right 07/12/2015   Procedure: Right Transmetatarsal amputation;  Surgeon: Jerona Harden GAILS, MD;  Location: [facility] OR;  Service: Orthopedics;  Laterality: Right;   BASCILIC VEIN TRANSPOSITION Left 12/31/2017   Procedure: FIRST STAGE BASILIC VEIN TRANSPOSITION LEFT ARM;  Surgeon: Laurence Redell CROME, MD;  Location[facility] OR;  Service: Vascular;  Laterality: Left;   BASCILIC VEIN TRANSPOSITION Left 02/21/2018   Procedure: SECOND STAGE BASILIC VEIN TRANSPOSITION, LEFT UPPER ARM;  Surgeon: Gretta Lonni PARAS, MD;  Location: [facility] OR;  Service: Vascular;  Laterality: Left;   CATARACT EXTRACTION, BILATERAL     CESAREAN SECTION  1989; 1995   EYE SURGERY Bilateral    hemorrhaged behind my eye   I & D EXTREMITY Right multiple   big toe   INCISION AND DRAINAGE ABSCESS Left 12/08/2018   Procedure: Incision And Drainage of Left Axillary Abscess;  Surgeon: Gretta Lonni PARAS, MD;  Location: [facility] OR;  Service: Vascular;  Laterality: Left;   PERIPHERAL VASCULAR INTERVENTION Left 08/31/2018   Procedure: PERIPHERAL VASCULAR INTERVENTION;  Surgeon: Gretta Lonni PARAS, MD;  Location: [facility] INVASIVE CV LAB;  Service: Cardiovascular;  Laterality: Left;   PERIPHERAL VASCULAR INTERVENTION Left 11/04/2018   Procedure: PERIPHERAL VASCULAR INTERVENTION;  Surgeon: Gretta Lonni PARAS, MD;  Location: [facility] INVASIVE CV LAB;  Service: Cardiovascular;  Laterality:  Left;  arm fistula   PERIPHERALLY INSERTED CENTRAL CATHETER INSERTION Right 08/31/2012   PICC LINE REMOVAL ([facility] HX) Right 10/12/2012   thelbert 10/12/2012   REFRACTIVE SURGERY Bilateral 2013   TOE AMPUTATION Right 11/2012   big toe   TONSILLECTOMY  1965   TOTAL THYROIDECTOMY  2012   TUBAL LIGATION  1995    Allergies[1]  Prior to Admission medications  Medication Sig Start Date End Date Taking? Authorizing Provider  Ascorbic Acid (VITAMIN C GUMMIE PO) Take 2  tablets by mouth daily.    [provider]  Cholecalciferol  (VITAMIN D -3) 125 MCG (5000 UT) TABS Take 5,000 Units by mouth daily.    [provider]  clonazePAM  (KLONOPIN ) 1 MG tablet Take one tab bed time as needed for insomnia one before dialysis for severe anxiety. 07/18/24   Arfeen, Leni DASEN, MD  dorzolamide -timolol  (COSOPT ) 22.3-6.8 MG/ML ophthalmic solution Place 1 drop into both eyes 2 (two) times daily.     [provider]  DULoxetine  (CYMBALTA ) 60 MG capsule Take 1 capsule (60 mg total) by mouth daily. 07/18/24   Arfeen, Leni DASEN, MD  ferric citrate  (AURYXIA ) 1 GM 210 MG(Fe) tablet Take 420 mg by mouth See admin instructions. 2-3  tabs with meals, 1 tab with snacks    [provider]  glucose blood test strip one strip (1 each total) by Other route 4 (four) times daily. Use as directed. Pharmacy, please dispense this brand of blood glucose test strips: Bayer Contour Next 03/09/17   [provider]  insulin  glargine (LANTUS ) 100 UNIT/ML injection Inject 40 Units into the skin 2 (two) times daily.    [provider]  insulin  lispro (HUMALOG ) 100 UNIT/ML injection Inject 20 Units into the skin See admin instructions. Inject up to 20 units subcutaneously three times daily before meals if needed Per sliding scale as directed    [provider]  levothyroxine  (SYNTHROID , LEVOTHROID) 150 MCG tablet Take 150 mcg by mouth daily before breakfast.    [provider]  lisinopril  (PRINIVIL ,ZESTRIL ) 20 MG tablet Take 20 mg by mouth at bedtime.  10/24/17   [provider]  multivitamin (RENA-VIT) TABS tablet Take 1 tablet by mouth at bedtime.     [provider]  NIFEdipine (ADALAT CC) 30 MG 24 hr tablet Take by mouth. Patient not taking: Reported on 10/19/2023 05/21/21   [provider]  ondansetron  (ZOFRAN  ODT) 4 MG disintegrating tablet Take 1 tablet (4 mg total) by mouth every 8 (eight) hours as needed for nausea or  vomiting. 12/02/19   Freddi Hamilton, MD  pantoprazole  (PROTONIX ) 40 MG tablet Take 1 tablet (40 mg total) by mouth daily. 10/19/23   Charlanne Groom, MD  RESTASIS  0.05 % ophthalmic emulsion Place 1 drop into both eyes 2 (two) times daily as needed (for dry/irritated eyes).  12/29/17   [provider]  rosuvastatin  (CRESTOR ) 40 MG tablet Take 40 mg by mouth daily.    [provider]  traZODone  (DESYREL ) 150 MG tablet Take 1 tablet (150 mg total) by mouth at bedtime as needed for sleep. 07/18/24   Arfeen, Leni DASEN, MD    Social History   Socioeconomic History   Marital status: Single    Spouse name: Not on file   Number of children: 3   Years of education: Not on file   Highest education level: Not on file  Occupational History   Not on file  Tobacco Use   Smoking status: Never  Smokeless tobacco: Never  Vaping Use   Vaping status: Never Used  Substance and Sexual Activity   Alcohol use: No    Alcohol/week: 0.0 standard drinks of alcohol   Drug use: No   Sexual activity: Not Currently  Other Topics Concern   Not on file  Social History Narrative   Lives in [place] with daughter and father. Completely independent in ADLs, IADLs.   Social Drivers of Health   Tobacco Use: Low Risk (07/20/2024)   Received from [facility]   Patient History    Smoking Tobacco Use: Never    Smokeless Tobacco Use: Never    Passive Exposure: Not on file  Financial Resource Strain: Low Risk (07/20/2024)   Received from [facility]   Overall Financial Resource Strain (CARDIA)    How hard is it for you to pay for the very basics like food, housing, medical care, and heating?: Not hard at all  Food Insecurity: No Food Insecurity (07/20/2024)   Received from [facility]   Epic    Within the past 12 months, you worried that your food would run out before you got the money to buy more.: Never true    Within the past 12 months, the food you bought just didn't last and you didn't have money  to get more.: Never true  Transportation Needs: No Transportation Needs (07/20/2024)   Received from [facility]    In the past 12 months, has lack of transportation kept you from medical appointments or from getting medications?: No    In the past 12 months, has lack of transportation kept you from meetings, work, or from getting things needed for daily living?: No  Physical Activity: Sufficiently Active (08/15/2023)   Received from [facility]   Exercise Vital Sign    On average, how many days per week do you engage in moderate to strenuous exercise (like a brisk walk)?: 4 days    On average, how many minutes do you engage in exercise at this level?: 40 min  Stress: Stress Concern Present (10/09/2023)   Received from [facility] - Occupational Stress Questionnaire    Feeling of Stress : Very much  Social Connections: Somewhat Isolated (08/15/2023)   Received from [facility]   Social Network    How would you rate your social network (family, work, friends)?: Restricted participation with some degree of social isolation  Intimate Partner Violence: Not At Risk (10/09/2023)   Received from [facility]   HITS    Over the last 12 months how often did your partner physically hurt you?: Never    Over the last 12 months how often did your partner insult you or talk down to you?: Never    Over the last 12 months how often did your partner threaten you with physical harm?: Never    Over the last 12 months how often did your partner scream or curse at you?: Never  Depression (PHQ2-9): Not on file  Alcohol Screen: Not on file  Housing: Low Risk (07/20/2024)   Received from [facility]    In the last 12 months, was there a time when you were not able to pay the mortgage or rent on time?: No    In the past 12 months, how many times have you moved where you were living?: 0    At any time in the past 12 months, were you homeless or living  in a shelter (including now)?: No  Utilities: Not At Risk (07/20/2024)   Received from [facility]    In the past 12 months has the electric, gas, oil, or water company threatened to shut off services in your home?: No  Health Literacy: Not on file     Family History  Problem Relation Age of Onset   Depression Mother    Diabetes Mother    Alcohol abuse Father    Alcohol abuse Sister    Diabetes Maternal Grandmother    Colon cancer Neg Hx    Esophageal cancer Neg Hx    Stomach cancer Neg Hx     ROS: Increased bleeding from fistula after dialysis   Physical Examination  Vitals:   07/27/24 0737 07/27/24 0750  BP: (!) 140/60 127/61  Pulse: 87 86  Resp: 12   Temp: 97.8 F (36.6 C)   SpO2: 97% 98%   There is no height or weight on file to calculate BMI.  Awake alert and oriented Nonlabored respirations Pulsatility medial left upper arm AV fistula  CBC    Component Value Date/Time   WBC 9.2 12/10/2023 0713   RBC 3.18 (L) 12/10/2023 0713   HGB 9.8 (L) 12/10/2023 0713   HCT 31.2 (L) 12/10/2023 0713   PLT 267 12/10/2023 0713   MCV 98.1 12/10/2023 0713   MCH 30.8 12/10/2023 0713   MCHC 31.4 12/10/2023 0713   RDW 14.0 12/10/2023 0713   LYMPHSABS 1.7 12/10/2023 0713   MONOABS 0.8 12/10/2023 0713   EOSABS 0.8 (H) 12/10/2023 0713   BASOSABS 0.1 12/10/2023 0713    BMET    Component Value Date/Time   NA 134 (L) 12/10/2023 0713   K 5.9 (H) 12/10/2023 0713   CL 101 12/10/2023 0713   CO2 25 12/10/2023 0713   GLUCOSE 151 (H) 12/10/2023 0713   BUN 44 (H) 12/10/2023 0713   CREATININE 6.59 (H) 12/10/2023 0713   CREATININE 0.80 09/14/2012 1344   CALCIUM  8.4 (L) 12/10/2023 0713   GFRNONAA 7 (L) 12/10/2023 0713   GFRAA 9 (L) 12/02/2019 1003    COAGS: Lab Results  Component Value Date   INR 1.15 07/09/2015   INR 1.07 04/02/2014     Vascular Imaging:   Previous fistulogram reviewed   ASSESSMENT/PLAN: This is a 64 y.o. female history of end-stage renal  disease on dialysis via left upper arm AV fistula located on her medial arm.  She has increased bleeding with dialysis with pulsatility on physical exam.  We have discussed her options moving forward she is elected for fistulogram with possible intervention.  All questions were answered we discussed the risk and benefits and consent was signed.    Pearl Berlinger C. Sheree, MD [facility] Office: [phone] Pager: [phone]     [1]  Allergies Allergen Reactions   Erythromycin Anaphylaxis   Isosorbide    Norvasc  [Amlodipine ] Shortness Of Breath and Swelling   Vancomycin  Shortness Of Breath and Other (See Comments)    Red Man Syndrome   Sulfa Antibiotics Other (See Comments)   Nsaids Other (See Comments)    Avoid due to CKD   Wellbutrin [Bupropion] Other (See Comments)    Extreme neurological effects    "

## 2024-10-23 ENCOUNTER — Ambulatory Visit: Admitting: Neurology
